# Patient Record
Sex: Female | Born: 1945 | Race: White | Hispanic: No | Marital: Married | Smoking: Never smoker
Health system: Southern US, Community
[De-identification: ages and names within clinical notes are randomized; demographics above are authoritative.]

## PROBLEM LIST (undated history)

## (undated) DIAGNOSIS — Z9221 Personal history of antineoplastic chemotherapy: Secondary | ICD-10-CM

## (undated) DIAGNOSIS — M8580 Other specified disorders of bone density and structure, unspecified site: Secondary | ICD-10-CM

## (undated) DIAGNOSIS — F32A Depression, unspecified: Secondary | ICD-10-CM

## (undated) DIAGNOSIS — Z1379 Encounter for other screening for genetic and chromosomal anomalies: Secondary | ICD-10-CM

## (undated) DIAGNOSIS — R112 Nausea with vomiting, unspecified: Secondary | ICD-10-CM

## (undated) DIAGNOSIS — F329 Major depressive disorder, single episode, unspecified: Secondary | ICD-10-CM

## (undated) DIAGNOSIS — Z923 Personal history of irradiation: Secondary | ICD-10-CM

## (undated) DIAGNOSIS — G20A1 Parkinson's disease without dyskinesia, without mention of fluctuations: Secondary | ICD-10-CM

## (undated) DIAGNOSIS — G20 Parkinson's disease: Secondary | ICD-10-CM

## (undated) DIAGNOSIS — L309 Dermatitis, unspecified: Secondary | ICD-10-CM

## (undated) DIAGNOSIS — H9190 Unspecified hearing loss, unspecified ear: Secondary | ICD-10-CM

## (undated) DIAGNOSIS — Z974 Presence of external hearing-aid: Secondary | ICD-10-CM

## (undated) DIAGNOSIS — C50919 Malignant neoplasm of unspecified site of unspecified female breast: Secondary | ICD-10-CM

## (undated) DIAGNOSIS — Z9889 Other specified postprocedural states: Secondary | ICD-10-CM

## (undated) DIAGNOSIS — T1490XA Injury, unspecified, initial encounter: Secondary | ICD-10-CM

## (undated) DIAGNOSIS — K635 Polyp of colon: Secondary | ICD-10-CM

## (undated) DIAGNOSIS — H9319 Tinnitus, unspecified ear: Secondary | ICD-10-CM

## (undated) DIAGNOSIS — F429 Obsessive-compulsive disorder, unspecified: Secondary | ICD-10-CM

## (undated) DIAGNOSIS — F419 Anxiety disorder, unspecified: Secondary | ICD-10-CM

## (undated) DIAGNOSIS — E7800 Pure hypercholesterolemia, unspecified: Secondary | ICD-10-CM

## (undated) HISTORY — PX: OTHER SURGICAL HISTORY: SHX169

## (undated) HISTORY — DX: Pure hypercholesterolemia, unspecified: E78.00

## (undated) HISTORY — DX: Depression, unspecified: F32.A

## (undated) HISTORY — PX: BREAST SURGERY: SHX581

## (undated) HISTORY — PX: DILATION AND CURETTAGE OF UTERUS: SHX78

## (undated) HISTORY — PX: MASTECTOMY: SHX3

## (undated) HISTORY — DX: Parkinson's disease without dyskinesia, without mention of fluctuations: G20.A1

## (undated) HISTORY — DX: Parkinson's disease: G20

## (undated) HISTORY — DX: Anxiety disorder, unspecified: F41.9

## (undated) HISTORY — DX: Major depressive disorder, single episode, unspecified: F32.9

## (undated) HISTORY — DX: Encounter for other screening for genetic and chromosomal anomalies: Z13.79

## (undated) HISTORY — PX: COLONOSCOPY: SHX174

## (undated) HISTORY — PX: CARDIAC CATHETERIZATION: SHX172

## (undated) HISTORY — DX: Other specified disorders of bone density and structure, unspecified site: M85.80

## (undated) HISTORY — PX: MOUTH SURGERY: SHX715

## (undated) HISTORY — DX: Polyp of colon: K63.5

## (undated) HISTORY — DX: Obsessive-compulsive disorder, unspecified: F42.9

## (undated) HISTORY — DX: Tinnitus, unspecified ear: H93.19

## (undated) HISTORY — DX: Malignant neoplasm of unspecified site of unspecified female breast: C50.919

## (undated) HISTORY — DX: Unspecified hearing loss, unspecified ear: H91.90

## (undated) NOTE — Telephone Encounter (Signed)
 Formatting of this note might be different from the original. Pt would like to speak with a nurse or physician, regarding the following.  (Medication or Ailment & Reason)  Pt calling to give her blood pressure log,  04/13/24-  blood pressure sitting 99/62, pulse 90 at 10:53 am  Blood pressure standing 88/52, pulse 95 at 10:57 am  Blood pressure sitting 86/51, pulse 92 2:05 pm  Blood pressure standing 67/52, pulse 96 2:10 pm  Blood pressure sitting 111/73, pulse 87 6:09 pm Blood pressure standing 94/55, pulse 99 6:15 pm  04/14/24- Blood pressure Sitting 92/54, pulse 97 11:09 am  Blood pressure  Standing 89/60, pulse 175 11:17 am Blood pressure  Sitting 88/53, pulse 89 3:18pm Blood pressure Standing 114/43, pulse 95 3:26 pm  Blood pressure  Sitting 123/68, pulse 92 5:56 pm  Blood pressure standing 80/49, pulse 98 6:00 pm   04/15/24 Blood pressure Sitting 119/58, pulse 85 11:22 am  Blood pressure Standing 90/41, pulse 94 11:26 am  Blood pressure Sitting 86/41, pulse 126 3:14 pm Blood pressure Sitting 113/98, pulse 132 3:18 pm Blood pressure Standing 120/83, pulse 126 3:21 pm Blood pressure Sitting 112/64, pulse 90 6:43 pm Blood pressure Standing 85/49, pulse 94 6:48 pm   Please call Pt to advise. [phone]  Electronically signed by Baldwin Search at 04/16/2024 10:26 AM CDT

---

## 2001-03-19 ENCOUNTER — Encounter: Admission: RE | Admit: 2001-03-19 | Discharge: 2001-03-19 | Payer: Self-pay | Admitting: Obstetrics and Gynecology

## 2001-03-19 ENCOUNTER — Encounter: Payer: Self-pay | Admitting: Obstetrics and Gynecology

## 2001-10-03 ENCOUNTER — Encounter: Admission: RE | Admit: 2001-10-03 | Discharge: 2001-10-03 | Payer: Self-pay | Admitting: General Surgery

## 2001-10-03 ENCOUNTER — Encounter: Payer: Self-pay | Admitting: General Surgery

## 2002-11-06 ENCOUNTER — Encounter: Payer: Self-pay | Admitting: Obstetrics and Gynecology

## 2002-11-06 ENCOUNTER — Encounter: Admission: RE | Admit: 2002-11-06 | Discharge: 2002-11-06 | Payer: Self-pay | Admitting: Obstetrics and Gynecology

## 2003-11-18 ENCOUNTER — Encounter: Admission: RE | Admit: 2003-11-18 | Discharge: 2003-11-18 | Payer: Self-pay | Admitting: Obstetrics and Gynecology

## 2004-05-01 ENCOUNTER — Other Ambulatory Visit: Admission: RE | Admit: 2004-05-01 | Discharge: 2004-05-01 | Payer: Self-pay | Admitting: Gynecology

## 2004-07-21 ENCOUNTER — Other Ambulatory Visit: Admission: RE | Admit: 2004-07-21 | Discharge: 2004-07-21 | Payer: Self-pay | Admitting: Gynecology

## 2004-11-30 ENCOUNTER — Encounter: Admission: RE | Admit: 2004-11-30 | Discharge: 2004-11-30 | Payer: Self-pay | Admitting: Gynecology

## 2005-05-28 ENCOUNTER — Other Ambulatory Visit: Admission: RE | Admit: 2005-05-28 | Discharge: 2005-05-28 | Payer: Self-pay | Admitting: Gynecology

## 2005-09-12 DIAGNOSIS — K635 Polyp of colon: Secondary | ICD-10-CM

## 2005-09-12 HISTORY — DX: Polyp of colon: K63.5

## 2005-12-11 ENCOUNTER — Encounter: Admission: RE | Admit: 2005-12-11 | Discharge: 2005-12-11 | Payer: Self-pay | Admitting: Gynecology

## 2006-05-21 ENCOUNTER — Encounter: Admission: RE | Admit: 2006-05-21 | Discharge: 2006-05-21 | Payer: Self-pay | Admitting: Family Medicine

## 2006-06-03 ENCOUNTER — Other Ambulatory Visit: Admission: RE | Admit: 2006-06-03 | Discharge: 2006-06-03 | Payer: Self-pay | Admitting: Gynecology

## 2006-12-26 ENCOUNTER — Encounter: Admission: RE | Admit: 2006-12-26 | Discharge: 2006-12-26 | Payer: Self-pay | Admitting: Gynecology

## 2007-03-27 ENCOUNTER — Encounter: Admission: RE | Admit: 2007-03-27 | Discharge: 2007-03-27 | Payer: Self-pay | Admitting: Family Medicine

## 2007-06-06 ENCOUNTER — Other Ambulatory Visit: Admission: RE | Admit: 2007-06-06 | Discharge: 2007-06-06 | Payer: Self-pay | Admitting: Gynecology

## 2008-01-01 ENCOUNTER — Encounter: Admission: RE | Admit: 2008-01-01 | Discharge: 2008-01-01 | Payer: Self-pay | Admitting: Gynecology

## 2008-06-16 ENCOUNTER — Ambulatory Visit: Payer: Self-pay | Admitting: Gynecology

## 2008-06-16 ENCOUNTER — Encounter: Payer: Self-pay | Admitting: Gynecology

## 2008-06-16 ENCOUNTER — Other Ambulatory Visit: Admission: RE | Admit: 2008-06-16 | Discharge: 2008-06-16 | Payer: Self-pay | Admitting: Gynecology

## 2008-08-23 ENCOUNTER — Ambulatory Visit: Payer: Self-pay | Admitting: Gynecology

## 2008-09-14 ENCOUNTER — Ambulatory Visit: Payer: Self-pay | Admitting: Gynecology

## 2008-09-28 ENCOUNTER — Ambulatory Visit: Payer: Self-pay | Admitting: Gynecology

## 2008-11-04 ENCOUNTER — Encounter: Admission: RE | Admit: 2008-11-04 | Discharge: 2008-11-04 | Payer: Self-pay | Admitting: Gastroenterology

## 2009-01-03 ENCOUNTER — Encounter: Admission: RE | Admit: 2009-01-03 | Discharge: 2009-01-03 | Payer: Self-pay | Admitting: Gynecology

## 2009-06-23 ENCOUNTER — Ambulatory Visit: Payer: Self-pay | Admitting: Gynecology

## 2009-06-23 ENCOUNTER — Other Ambulatory Visit: Admission: RE | Admit: 2009-06-23 | Discharge: 2009-06-23 | Payer: Self-pay | Admitting: Gynecology

## 2009-06-23 ENCOUNTER — Encounter: Payer: Self-pay | Admitting: Gynecology

## 2010-01-11 ENCOUNTER — Encounter: Admission: RE | Admit: 2010-01-11 | Discharge: 2010-01-11 | Payer: Self-pay | Admitting: Gynecology

## 2010-07-04 ENCOUNTER — Ambulatory Visit: Payer: Self-pay | Admitting: Gynecology

## 2010-07-04 ENCOUNTER — Other Ambulatory Visit: Admission: RE | Admit: 2010-07-04 | Discharge: 2010-07-04 | Payer: Self-pay | Admitting: Gynecology

## 2010-09-12 ENCOUNTER — Ambulatory Visit: Payer: Self-pay | Admitting: Gynecology

## 2010-09-18 ENCOUNTER — Ambulatory Visit: Payer: Self-pay | Admitting: Gynecology

## 2011-01-01 ENCOUNTER — Other Ambulatory Visit: Payer: Self-pay | Admitting: Gynecology

## 2011-01-01 DIAGNOSIS — Z1231 Encounter for screening mammogram for malignant neoplasm of breast: Secondary | ICD-10-CM

## 2011-01-15 ENCOUNTER — Ambulatory Visit
Admission: RE | Admit: 2011-01-15 | Discharge: 2011-01-15 | Disposition: A | Discharge status: Home / Self Care | Payer: Medicare Other | Source: Ambulatory Visit | Attending: Gynecology | Admitting: Gynecology

## 2011-01-15 DIAGNOSIS — Z1231 Encounter for screening mammogram for malignant neoplasm of breast: Secondary | ICD-10-CM

## 2011-07-26 ENCOUNTER — Encounter: Payer: Self-pay | Admitting: Anesthesiology

## 2011-07-31 ENCOUNTER — Ambulatory Visit (INDEPENDENT_AMBULATORY_CARE_PROVIDER_SITE_OTHER): Payer: Medicare Other | Admitting: Gynecology

## 2011-07-31 ENCOUNTER — Encounter: Payer: Self-pay | Admitting: Gynecology

## 2011-07-31 VITALS — BP 120/70 | Ht 61.0 in | Wt 156.0 lb

## 2011-07-31 DIAGNOSIS — R82998 Other abnormal findings in urine: Secondary | ICD-10-CM

## 2011-07-31 DIAGNOSIS — N952 Postmenopausal atrophic vaginitis: Secondary | ICD-10-CM

## 2011-07-31 DIAGNOSIS — M8580 Other specified disorders of bone density and structure, unspecified site: Secondary | ICD-10-CM

## 2011-07-31 DIAGNOSIS — M899 Disorder of bone, unspecified: Secondary | ICD-10-CM

## 2011-07-31 NOTE — Progress Notes (Signed)
Addended byCammie Mcgee T on: 07/31/2011 11:19 AM   Modules accepted: Orders

## 2011-07-31 NOTE — Progress Notes (Signed)
Gloria Ware 09-Aug-1946 [id number]        65 y.o.  for follow up. Overall she is doing well history of osteopenia.   Past medical history,surgical history, medications, allergies, family history and social history were all reviewed and documented in the EPIC chart. ROS:  Was performed and pertinent positives and negatives are included in the history.  Exam: chaperone present Filed Vitals:   07/31/11 1031  BP: 120/70   General appearance  Normal Skin grossly normal Head/Neck normal with no cervical or supraclavicular adenopathy thyroid normal Lungs  Clear Cardiac RR, without RMG Abdominal  soft, nontender, without masses, organomegaly or hernia Breasts  examined lying and sitting without masses, retractions, discharge or axillary adenopathy. Pelvic  Ext/BUS/vagina  normal With atrophic genital changes noted  Cervix  normal    Uterus  axial, normal size, shape and contour, midline and mobile nontender   Adnexa  Without masses or tenderness    Anus and perineum  normal   Rectovaginal  normal sphincter tone without palpated masses or tenderness.    Assessment/Plan:  65 y.o. female for annual exam.     1. Osteopenia. Had been on for NIDA in the past but has stopped this and is on a drug-free holiday. Last bone density December 2011 showed -1.8 at the left femoral neck. We'll plan on repeat next year a 2 year interval and then go from there. Increase calcium vitamin D reviewed. 2. Health maintenance. SBE monthly reviewed. Had mammography April 2012 continued annual mammogram. Had colonoscopy February 2012. No history of significant abnormal Pap smears with documented normals in her chart with last 2011. Current screening guidelines reviewed with less frequent interval. Patient is comfortable with this and we'll plan on every three-year screening. No blood work was done today as it's done through her primary's office who follows her for her medical issues. Assuming she continues well from  a gynecologic standpoint then she will see [name] in a year sooner as needed.   Dara Lords MD, 11:01 AM 07/31/2011

## 2011-09-06 ENCOUNTER — Other Ambulatory Visit: Payer: Self-pay | Admitting: Otolaryngology

## 2011-09-27 ENCOUNTER — Ambulatory Visit
Admission: RE | Admit: 2011-09-27 | Discharge: 2011-09-27 | Disposition: A | Discharge status: Home / Self Care | Payer: Medicare Other | Source: Ambulatory Visit | Attending: Otolaryngology | Admitting: Otolaryngology

## 2011-09-27 ENCOUNTER — Other Ambulatory Visit: Payer: Self-pay | Admitting: Otolaryngology

## 2011-09-27 LAB — CREATININE, SERUM: Creat: 0.9 mg/dL (ref 0.50–1.10)

## 2011-09-27 MED ORDER — GADOBENATE DIMEGLUMINE 529 MG/ML IV SOLN
14.0000 mL | Freq: Once | INTRAVENOUS | Status: AC | PRN
  Administered 2011-09-27: 14 mL via INTRAVENOUS

## 2011-12-05 DIAGNOSIS — H25049 Posterior subcapsular polar age-related cataract, unspecified eye: Secondary | ICD-10-CM | POA: Diagnosis not present

## 2012-01-10 ENCOUNTER — Other Ambulatory Visit: Payer: Self-pay | Admitting: Gynecology

## 2012-01-10 DIAGNOSIS — Z1231 Encounter for screening mammogram for malignant neoplasm of breast: Secondary | ICD-10-CM

## 2012-01-30 DIAGNOSIS — F411 Generalized anxiety disorder: Secondary | ICD-10-CM | POA: Diagnosis not present

## 2012-01-30 DIAGNOSIS — E785 Hyperlipidemia, unspecified: Secondary | ICD-10-CM | POA: Diagnosis not present

## 2012-01-31 ENCOUNTER — Ambulatory Visit
Admission: RE | Admit: 2012-01-31 | Discharge: 2012-01-31 | Disposition: A | Discharge status: Home / Self Care | Payer: Medicare Other | Source: Ambulatory Visit | Attending: Gynecology | Admitting: Gynecology

## 2012-01-31 DIAGNOSIS — Z1231 Encounter for screening mammogram for malignant neoplasm of breast: Secondary | ICD-10-CM | POA: Diagnosis not present

## 2012-03-20 DIAGNOSIS — Z0000 Encounter for general adult medical examination without abnormal findings: Secondary | ICD-10-CM | POA: Diagnosis not present

## 2012-07-24 DIAGNOSIS — Z23 Encounter for immunization: Secondary | ICD-10-CM | POA: Diagnosis not present

## 2012-08-06 ENCOUNTER — Encounter: Payer: Self-pay | Admitting: Gynecology

## 2012-08-06 ENCOUNTER — Ambulatory Visit (INDEPENDENT_AMBULATORY_CARE_PROVIDER_SITE_OTHER): Payer: BC Managed Care – PPO | Admitting: Gynecology

## 2012-08-06 VITALS — BP 126/70 | Ht 60.75 in | Wt 158.0 lb

## 2012-08-06 DIAGNOSIS — M8580 Other specified disorders of bone density and structure, unspecified site: Secondary | ICD-10-CM

## 2012-08-06 DIAGNOSIS — R82998 Other abnormal findings in urine: Secondary | ICD-10-CM | POA: Diagnosis not present

## 2012-08-06 DIAGNOSIS — M899 Disorder of bone, unspecified: Secondary | ICD-10-CM | POA: Diagnosis not present

## 2012-08-06 DIAGNOSIS — N952 Postmenopausal atrophic vaginitis: Secondary | ICD-10-CM

## 2012-08-06 DIAGNOSIS — M949 Disorder of cartilage, unspecified: Secondary | ICD-10-CM | POA: Diagnosis not present

## 2012-08-06 NOTE — Patient Instructions (Signed)
Follow up in one year for annual exam 

## 2012-08-06 NOTE — Progress Notes (Signed)
Gloria Ware [birth year] [id number]        66 y.o.  G2P2 for follow up exam.    Past medical history,surgical history, medications, allergies, family history and social history were all reviewed and documented in the EPIC chart. ROS:  Was performed and pertinent positives and negatives are included in the history.  Exam: Sherrilyn Rist assistant Filed Vitals:   08/06/12 1104  BP: 126/70  Height: 5' 0.75" (1.543 m)  Weight: 158 lb (71.668 kg)   General appearance  Normal Skin grossly normal Head/Neck normal with no cervical or supraclavicular adenopathy thyroid normal Lungs  clear Cardiac RR, without RMG Abdominal  soft, nontender, without masses, organomegaly or hernia Breasts  examined lying and sitting without masses, retractions, discharge or axillary adenopathy. Pelvic  Ext/BUS/vagina  normal with atrophic changes  Cervix  normal with atrophic changes  Uterus  axial, normal size, shape and contour, midline and mobile nontender   Adnexa  Without masses or tenderness    Anus and perineum  normal   Rectovaginal  normal sphincter tone without palpated masses or tenderness.    Assessment/Plan:  66 y.o. G2P2 female for follow up exam.   1. Postmenopausal with atrophic genital changes. Asymptomatic. We'll continue to monitor. Report any bleeding or other symptoms. 2. Osteopenia. DEXA 09/2010 with T score -1.8. Had been on Boniva previously but is on a drug-free holiday. Check DEXA now. 3. Pap smear. No Pap smear done today. Last Pap smear 2011. Discussed current screening guidelines options to stop altogether if she is over 65 and no history of abnormal Pap smears previously versus less frequent screening interval reviewed. We'll readdress on an annual basis. 4. Mammography. Patient had mammogram 01/2012. We'll continue with annual mammography. SBE monthly reviewed. 5. Colonoscopy. Patient had 2011. We'll follow up with the recommended interval. 6. Weight loss. Patient has gained weight but  admits to dietary indiscretion. The need to balance calories and and calories out reviewed. Increased exercise and decrease caloric intake discussed. Patient can work on this this year. 7. Health maintenance. Patient actively sees Dr. Leeroy Cha for routine health maintenance she'll follow up with him for this. No blood work done today. Follow up one year, sooner as needed.    Dara Lords MD, 11:51 AM 08/06/2012

## 2012-08-07 DIAGNOSIS — E785 Hyperlipidemia, unspecified: Secondary | ICD-10-CM | POA: Diagnosis not present

## 2012-08-07 DIAGNOSIS — F411 Generalized anxiety disorder: Secondary | ICD-10-CM | POA: Diagnosis not present

## 2012-08-07 LAB — URINALYSIS W MICROSCOPIC + REFLEX CULTURE
Casts: NONE SEEN
Glucose, UA: NEGATIVE mg/dL
Ketones, ur: NEGATIVE mg/dL
Nitrite: NEGATIVE
Specific Gravity, Urine: 1.016 (ref 1.005–1.030)

## 2012-08-08 ENCOUNTER — Telehealth: Payer: Self-pay | Admitting: Gynecology

## 2012-08-08 MED ORDER — SULFAMETHOXAZOLE-TMP DS 800-160 MG PO TABS: 1.0000 | ORAL_TABLET | Freq: Two times a day (BID) | ORAL | Status: DC

## 2012-08-08 NOTE — Telephone Encounter (Signed)
Patient called in follow up of our phone call to treat her for her asymptomatic bacteriuria. I reviewed with her that even though she does not have symptoms her urine grew out greater than 100,000 Escherichia coli and I recommended we go ahead and cover her with Septra DS one by mouth twice a day x7 days and she is in agreement. She would take this medication and follow up if there are any problems.

## 2012-08-08 NOTE — Telephone Encounter (Signed)
Left message for pt to call.

## 2012-08-08 NOTE — Telephone Encounter (Signed)
Tell patient her urine grew out bacteria and I will cover her with Septra DS one by mouth twice a day x7 days

## 2012-08-09 LAB — URINE CULTURE: Colony Count: >=100000

## 2012-09-16 ENCOUNTER — Ambulatory Visit (INDEPENDENT_AMBULATORY_CARE_PROVIDER_SITE_OTHER): Payer: Medicare Other

## 2012-09-16 DIAGNOSIS — M949 Disorder of cartilage, unspecified: Secondary | ICD-10-CM | POA: Diagnosis not present

## 2012-09-16 DIAGNOSIS — M899 Disorder of bone, unspecified: Secondary | ICD-10-CM

## 2012-09-16 DIAGNOSIS — M8580 Other specified disorders of bone density and structure, unspecified site: Secondary | ICD-10-CM

## 2012-09-17 ENCOUNTER — Telehealth: Payer: Self-pay | Admitting: Gynecology

## 2012-09-17 NOTE — Telephone Encounter (Signed)
Tell patient I want to talk to her about her bone density report and ask he to make an appointment to see me.

## 2012-09-18 NOTE — Telephone Encounter (Signed)
Left message patient to call.  

## 2012-09-18 NOTE — Telephone Encounter (Signed)
Patient informed and appt scheduled.

## 2012-09-22 ENCOUNTER — Ambulatory Visit (INDEPENDENT_AMBULATORY_CARE_PROVIDER_SITE_OTHER): Payer: Medicare Other | Admitting: Gynecology

## 2012-09-22 ENCOUNTER — Encounter: Payer: Self-pay | Admitting: Gynecology

## 2012-09-22 DIAGNOSIS — M899 Disorder of bone, unspecified: Secondary | ICD-10-CM

## 2012-09-22 DIAGNOSIS — M949 Disorder of cartilage, unspecified: Secondary | ICD-10-CM | POA: Diagnosis not present

## 2012-09-22 DIAGNOSIS — M8580 Other specified disorders of bone density and structure, unspecified site: Secondary | ICD-10-CM

## 2012-09-22 MED ORDER — ALENDRONATE SODIUM 70 MG PO TABS: 70.0000 mg | ORAL_TABLET | ORAL | Status: DC

## 2012-09-22 NOTE — Progress Notes (Signed)
Patient presents to discuss her DEXA. History of osteopenia where she had taken initially Fosamax and then Boniva for approximately 5 years but has been off of her medication for 2 years on a drug-free holiday.  DEXA shows T score -2.4 at the left femoral neck with a flax ten-year probability of fracture overall 34% hip at 7.3%. I reviewed the increased risk of fracture with her and my recommendation to restart the bisphosphate. I reviewed the risks to include osteonecrosis of the jaw, atypical fracture, GERD and esophageal cancer. Patient understands accepts and I reviewed the ways of taking Fosamax and she is to go ahead and restart this. We'll plan on reDEXA in 2 years.

## 2012-09-22 NOTE — Patient Instructions (Addendum)
Alendronate tablets  What is this medicine?  ALENDRONATE (a LEN droe nate) slows calcium loss from bones. It helps to make normal healthy bone and to slow bone loss in people with Paget's disease and osteoporosis. It may be used in others at risk for bone loss.  This medicine may be used for other purposes; ask your health care provider or pharmacist if you have questions.  What should I tell my health care provider before I take this medicine?  They need to know if you have any of these conditions:  -dental disease  -esophagus, stomach, or intestine problems, like acid reflux or GERD  -kidney disease  -low blood calcium  -low vitamin D  -problems sitting or standing 30 minutes  -trouble swallowing  -an unusual or allergic reaction to alendronate, other medicines, foods, dyes, or preservatives  -pregnant or trying to get pregnant  -breast-feeding  How should I use this medicine?  You must take this medicine exactly as directed or you will lower the amount of the medicine you absorb into your body or you may cause yourself harm. Take this medicine by mouth first thing in the morning, after you are up for the day. Do not eat or drink anything before you take your medicine. Swallow the tablet with a full glass (6 to 8 fluid ounces) of plain water. Do not take this medicine with any other drink. Do not chew or crush the tablet. After taking this medicine, do not eat breakfast, drink, or take any medicines or vitamins for at least 30 minutes. Sit or stand up for at least 30 minutes after you take this medicine; do not lie down. Do not take your medicine more often than directed.  Talk to your pediatrician regarding the use of this medicine in children. Special care may be needed.  Overdosage: If you think you have taken too much of this medicine contact a poison control center or emergency room at once.  NOTE: This medicine is only for you. Do not share this medicine with others.  What if I miss a dose?  If you miss a  dose, do not take it later in the day. Continue your normal schedule starting the next morning. Do not take double or extra doses.  What may interact with this medicine?  -aluminum hydroxide  -antacids  -aspirin  -calcium supplements  -drugs for inflammation like ibuprofen, naproxen, and others  -iron supplements  -magnesium supplements  -vitamins with minerals  This list may not describe all possible interactions. Give your health care provider a list of all the medicines, herbs, non-prescription drugs, or dietary supplements you use. Also tell them if you smoke, drink alcohol, or use illegal drugs. Some items may interact with your medicine.  What should I watch for while using this medicine?  Visit your doctor or health care professional for regular checks ups. It may be some time before you see benefit from this medicine. Do not stop taking your medicine except on your doctor's advice. Your doctor or health care professional may order blood tests and other tests to see how you are doing.  You should make sure you get enough calcium and vitamin D while you are taking this medicine, unless your doctor tells you not to. Discuss the foods you eat and the vitamins you take with your health care professional.  Some people who take this medicine have severe bone, joint, and/or muscle pain. This medicine may also increase your risk for a broken thigh bone.   Tell your doctor right away if you have pain in your upper leg or groin. Tell your doctor if you have any pain that does not go away or that gets worse.  This medicine can make you more sensitive to the sun. If you get a rash while taking this medicine, sunlight may cause the rash to get worse. Keep out of the sun. If you cannot avoid being in the sun, wear protective clothing and use sunscreen. Do not use sun lamps or tanning beds/booths.  What side effects may I notice from receiving this medicine?  Side effects that you should report to your doctor or health care  professional as soon as possible:  -allergic reactions like skin rash, itching or hives, swelling of the face, lips, or tongue  -black or tarry stools  -bone, muscle or joint pain  -changes in vision  -chest pain  -heartburn or stomach pain  -jaw pain, especially after dental work  -pain or trouble when swallowing  -redness, blistering, peeling or loosening of the skin, including inside the mouth  Side effects that usually do not require medical attention (report to your doctor or health care professional if they continue or are bothersome):  -changes in taste  -diarrhea or constipation  -eye pain or itching  -headache  -nausea or vomiting  -stomach gas or fullness  This list may not describe all possible side effects. Call your doctor for medical advice about side effects. You may report side effects to FDA at 1-800-FDA-1088.  Where should I keep my medicine?  Keep out of the reach of children.  Store at room temperature of 15 and 30 degrees C (59 and 86 degrees F). Throw away any unused medicine after the expiration date.  NOTE: This sheet is a summary. It may not cover all possible information. If you have questions about this medicine, talk to your doctor, pharmacist, or health care provider.   2013, [employer]. (03/23/2011 8:56:09 AM)

## 2012-11-03 ENCOUNTER — Telehealth: Payer: Self-pay | Admitting: *Deleted

## 2012-11-03 MED ORDER — RISEDRONATE SODIUM 150 MG PO TABS: 150.0000 mg | ORAL_TABLET | ORAL | Status: DC

## 2012-11-03 NOTE — Telephone Encounter (Signed)
Pt informed with the below note. 

## 2012-11-03 NOTE — Telephone Encounter (Signed)
Pt is currently taking fosamax and is complaining that it gives her terrible heart burn. Pt took the medication as direction with directions. She asked if boniva would be an option for her? If not what else could she take, because she does want treatment for her bones. Please advise

## 2012-11-03 NOTE — Telephone Encounter (Signed)
I would suggest trying a once monthly. My concern with Gloria Ware is that it has been shown to be beneficial at the spine but not statistically significant at the hips. That seems to be the main issue for [name]. My suggestion would be to try Actonel 150 mg monthly and see how she does with that.  I left a sample at the front desk for her to try and wrote prescription for one year supply.

## 2012-11-10 DIAGNOSIS — Z23 Encounter for immunization: Secondary | ICD-10-CM | POA: Diagnosis not present

## 2013-01-08 DIAGNOSIS — H25049 Posterior subcapsular polar age-related cataract, unspecified eye: Secondary | ICD-10-CM | POA: Diagnosis not present

## 2013-02-02 ENCOUNTER — Other Ambulatory Visit: Payer: Self-pay

## 2013-02-02 DIAGNOSIS — Z1231 Encounter for screening mammogram for malignant neoplasm of breast: Secondary | ICD-10-CM

## 2013-02-12 ENCOUNTER — Ambulatory Visit
Admission: RE | Admit: 2013-02-12 | Discharge: 2013-02-12 | Disposition: A | Discharge status: Home / Self Care | Payer: Medicare Other | Source: Ambulatory Visit

## 2013-02-12 DIAGNOSIS — Z1231 Encounter for screening mammogram for malignant neoplasm of breast: Secondary | ICD-10-CM

## 2013-03-24 DIAGNOSIS — F411 Generalized anxiety disorder: Secondary | ICD-10-CM | POA: Diagnosis not present

## 2013-03-24 DIAGNOSIS — Z1331 Encounter for screening for depression: Secondary | ICD-10-CM | POA: Diagnosis not present

## 2013-03-24 DIAGNOSIS — Z23 Encounter for immunization: Secondary | ICD-10-CM | POA: Diagnosis not present

## 2013-03-24 DIAGNOSIS — Z0000 Encounter for general adult medical examination without abnormal findings: Secondary | ICD-10-CM | POA: Diagnosis not present

## 2013-03-24 DIAGNOSIS — E785 Hyperlipidemia, unspecified: Secondary | ICD-10-CM | POA: Diagnosis not present

## 2013-03-30 ENCOUNTER — Telehealth: Payer: Self-pay | Admitting: *Deleted

## 2013-03-30 NOTE — Telephone Encounter (Signed)
Pt informed with the below note, she will continue the Actonel.

## 2013-03-30 NOTE — Telephone Encounter (Signed)
Her last bone density clearly showed treatment is indicated. She has increased risk of fracture. I cannot speak to the dental issues but as far as the knee for medication I think it is indicated to help prevent fracture.

## 2013-03-30 NOTE — Telephone Encounter (Signed)
Pt was given Rx for Actonel 150 mg back in Jan. Pt has had a lot of oral surgery with gums and teeth implant placed, surgeron told pt that only take the Actonel if she truly needs medication, which scared her, stating that it may weaken her teeth. She has only taken 3 doses of Actonel and stopped medication, she has osteopenia and wasn't sure if you were aware of the oral surgery. Pt asked if you think she will be safe to take medication?

## 2013-07-15 DIAGNOSIS — Z23 Encounter for immunization: Secondary | ICD-10-CM | POA: Diagnosis not present

## 2013-08-07 DIAGNOSIS — H00029 Hordeolum internum unspecified eye, unspecified eyelid: Secondary | ICD-10-CM | POA: Diagnosis not present

## 2013-08-10 ENCOUNTER — Other Ambulatory Visit (HOSPITAL_COMMUNITY)
Admission: RE | Admit: 2013-08-10 | Discharge: 2013-08-10 | Disposition: A | Discharge status: Home / Self Care | Payer: Medicare Other | Source: Ambulatory Visit | Attending: Gynecology | Admitting: Gynecology

## 2013-08-10 ENCOUNTER — Encounter: Payer: Self-pay | Admitting: Gynecology

## 2013-08-10 ENCOUNTER — Ambulatory Visit (INDEPENDENT_AMBULATORY_CARE_PROVIDER_SITE_OTHER): Payer: Medicare Other | Admitting: Gynecology

## 2013-08-10 VITALS — BP 114/70 | Ht 61.0 in | Wt 155.0 lb

## 2013-08-10 DIAGNOSIS — N952 Postmenopausal atrophic vaginitis: Secondary | ICD-10-CM

## 2013-08-10 DIAGNOSIS — M8580 Other specified disorders of bone density and structure, unspecified site: Secondary | ICD-10-CM

## 2013-08-10 DIAGNOSIS — M899 Disorder of bone, unspecified: Secondary | ICD-10-CM

## 2013-08-10 DIAGNOSIS — L723 Sebaceous cyst: Secondary | ICD-10-CM | POA: Diagnosis not present

## 2013-08-10 DIAGNOSIS — Z124 Encounter for screening for malignant neoplasm of cervix: Secondary | ICD-10-CM | POA: Insufficient documentation

## 2013-08-10 MED ORDER — RISEDRONATE SODIUM 150 MG PO TABS: 150.0000 mg | ORAL_TABLET | ORAL | Status: DC

## 2013-08-10 NOTE — Progress Notes (Signed)
Gloria Ware 08/21/1946 [id number]        67 y.o.  G2P2 for followup exam.  Several issues noted below.  Past medical history,surgical history, problem list, medications, allergies, family history and social history were all reviewed and documented in the EPIC chart.  ROS:  Performed and pertinent positives and negatives are included in the history, assessment and plan .  Exam: Kim assistant Filed Vitals:   08/10/13 0933  BP: 114/70  Height: 5\' 1"  (1.549 m)  Weight: 155 lb (70.308 kg)   General appearance  Normal Skin grossly normal Head/Neck normal with no cervical or supraclavicular adenopathy thyroid normal Lungs  clear Cardiac RR, without RMG Abdominal  soft, nontender, without masses, organomegaly or hernia Breasts  examined lying and sitting without masses, retractions, discharge or axillary adenopathy. Pelvic  Ext/BUS/vagina  normal with atrophic changes  Cervix  normal with atrophic changes. Pap done  Uterus  anteverted, normal size, shape and contour, midline and mobile nontender   Adnexa  Without masses or tenderness    Anus and perineum  normal   Rectovaginal  normal sphincter tone without palpated masses or tenderness.    Assessment/Plan:  67 y.o. G2P2 female for annual exam.   1. Postmenopausal. Patient without significant symptoms such as hot flashes, night sweats, vaginal dryness, bleeding or dyspareunia. Will continue to monitor. Patient knows to report any vaginal bleeding. 2. Osteopenia 09/2012 T score -2.4. FRAX 34%/7.3%. On Actonel since last year. Had been on Boniva previously for approximately 5 years and then off of it for 2 years. Continue Actonel through next year with repeat DEXA at a 2 year interval. Increase calcium vitamin D reviewed. 3. Perianal sebaceous cyst x2. At 8:00 and 4:00 patient has 2 small classic sebaceous cysts around the anal opening. Patient had noticed these and brought these to my attention. Reassured patient that she will continue  to monitor as long as they remain unchanged she will follow. 4. Pap smear 2011. Pap smear done today. Reviewed current screening guidelines and options to stop screening altogether as she is over 65 or less frequent screening intervals like every 3 years discussed. She has no history of any abnormal Pap smears previously. Will rediscuss on an annual basis. 5. Mammography 02/2013. Continue with annual mammography. SBE monthly reviewed. 6. Colonoscopy 2011. Patient is going to call her gastroenterologist at the five-year mark to check when she should repeat this. 7. Health maintenance. No blood work done as this is all done through her primary physician's office. Followup one year, sooner as needed.  Note: This document was prepared with digital dictation and possible smart phrase technology. Any transcriptional errors that result from this process are unintentional.   Dara Lords MD, 10:35 AM 08/10/2013

## 2013-08-10 NOTE — Patient Instructions (Signed)
Follow up in one year, sooner as needed. 

## 2013-08-14 DIAGNOSIS — H25049 Posterior subcapsular polar age-related cataract, unspecified eye: Secondary | ICD-10-CM | POA: Diagnosis not present

## 2013-09-23 DIAGNOSIS — F411 Generalized anxiety disorder: Secondary | ICD-10-CM | POA: Diagnosis not present

## 2013-09-23 DIAGNOSIS — E785 Hyperlipidemia, unspecified: Secondary | ICD-10-CM | POA: Diagnosis not present

## 2013-10-27 DIAGNOSIS — H25049 Posterior subcapsular polar age-related cataract, unspecified eye: Secondary | ICD-10-CM | POA: Diagnosis not present

## 2013-11-20 DIAGNOSIS — H2589 Other age-related cataract: Secondary | ICD-10-CM | POA: Diagnosis not present

## 2013-11-20 DIAGNOSIS — H43399 Other vitreous opacities, unspecified eye: Secondary | ICD-10-CM | POA: Diagnosis not present

## 2013-11-20 DIAGNOSIS — H43819 Vitreous degeneration, unspecified eye: Secondary | ICD-10-CM | POA: Diagnosis not present

## 2014-01-19 DIAGNOSIS — R5381 Other malaise: Secondary | ICD-10-CM | POA: Diagnosis not present

## 2014-01-19 DIAGNOSIS — R5383 Other fatigue: Secondary | ICD-10-CM | POA: Diagnosis not present

## 2014-01-25 DIAGNOSIS — H25019 Cortical age-related cataract, unspecified eye: Secondary | ICD-10-CM | POA: Diagnosis not present

## 2014-01-25 DIAGNOSIS — H25049 Posterior subcapsular polar age-related cataract, unspecified eye: Secondary | ICD-10-CM | POA: Diagnosis not present

## 2014-01-25 DIAGNOSIS — H2589 Other age-related cataract: Secondary | ICD-10-CM | POA: Diagnosis not present

## 2014-01-25 DIAGNOSIS — H2510 Age-related nuclear cataract, unspecified eye: Secondary | ICD-10-CM | POA: Diagnosis not present

## 2014-02-15 DIAGNOSIS — H2589 Other age-related cataract: Secondary | ICD-10-CM | POA: Diagnosis not present

## 2014-02-22 DIAGNOSIS — H2589 Other age-related cataract: Secondary | ICD-10-CM | POA: Diagnosis not present

## 2014-02-22 DIAGNOSIS — H25019 Cortical age-related cataract, unspecified eye: Secondary | ICD-10-CM | POA: Diagnosis not present

## 2014-02-22 DIAGNOSIS — H25049 Posterior subcapsular polar age-related cataract, unspecified eye: Secondary | ICD-10-CM | POA: Diagnosis not present

## 2014-02-22 DIAGNOSIS — H2510 Age-related nuclear cataract, unspecified eye: Secondary | ICD-10-CM | POA: Diagnosis not present

## 2014-02-23 ENCOUNTER — Other Ambulatory Visit: Payer: Self-pay

## 2014-02-23 DIAGNOSIS — Z961 Presence of intraocular lens: Secondary | ICD-10-CM | POA: Diagnosis not present

## 2014-02-23 DIAGNOSIS — Z1231 Encounter for screening mammogram for malignant neoplasm of breast: Secondary | ICD-10-CM

## 2014-02-23 DIAGNOSIS — D2310 Other benign neoplasm of skin of unspecified eyelid, including canthus: Secondary | ICD-10-CM | POA: Diagnosis not present

## 2014-03-09 ENCOUNTER — Other Ambulatory Visit: Payer: Self-pay | Admitting: Ophthalmology

## 2014-03-09 DIAGNOSIS — Z961 Presence of intraocular lens: Secondary | ICD-10-CM | POA: Diagnosis not present

## 2014-03-09 DIAGNOSIS — D2310 Other benign neoplasm of skin of unspecified eyelid, including canthus: Secondary | ICD-10-CM | POA: Diagnosis not present

## 2014-03-09 DIAGNOSIS — D1801 Hemangioma of skin and subcutaneous tissue: Secondary | ICD-10-CM | POA: Diagnosis not present

## 2014-03-11 ENCOUNTER — Ambulatory Visit
Admission: RE | Admit: 2014-03-11 | Discharge: 2014-03-11 | Disposition: A | Discharge status: Home / Self Care | Payer: Medicare Other | Source: Ambulatory Visit

## 2014-03-11 ENCOUNTER — Encounter (INDEPENDENT_AMBULATORY_CARE_PROVIDER_SITE_OTHER): Payer: Self-pay

## 2014-03-11 DIAGNOSIS — Z1231 Encounter for screening mammogram for malignant neoplasm of breast: Secondary | ICD-10-CM

## 2014-04-08 DIAGNOSIS — F411 Generalized anxiety disorder: Secondary | ICD-10-CM | POA: Diagnosis not present

## 2014-04-08 DIAGNOSIS — E785 Hyperlipidemia, unspecified: Secondary | ICD-10-CM | POA: Diagnosis not present

## 2014-07-28 DIAGNOSIS — H26493 Other secondary cataract, bilateral: Secondary | ICD-10-CM | POA: Diagnosis not present

## 2014-07-29 DIAGNOSIS — Z23 Encounter for immunization: Secondary | ICD-10-CM | POA: Diagnosis not present

## 2014-08-02 ENCOUNTER — Other Ambulatory Visit: Payer: Self-pay | Admitting: Gynecology

## 2014-08-09 ENCOUNTER — Encounter: Payer: Self-pay | Admitting: Gynecology

## 2014-08-09 DIAGNOSIS — Z23 Encounter for immunization: Secondary | ICD-10-CM | POA: Diagnosis not present

## 2014-08-09 DIAGNOSIS — R748 Abnormal levels of other serum enzymes: Secondary | ICD-10-CM | POA: Diagnosis not present

## 2014-08-09 DIAGNOSIS — J309 Allergic rhinitis, unspecified: Secondary | ICD-10-CM | POA: Diagnosis not present

## 2014-08-09 DIAGNOSIS — Z1389 Encounter for screening for other disorder: Secondary | ICD-10-CM | POA: Diagnosis not present

## 2014-08-09 DIAGNOSIS — F419 Anxiety disorder, unspecified: Secondary | ICD-10-CM | POA: Diagnosis not present

## 2014-08-09 DIAGNOSIS — M8580 Other specified disorders of bone density and structure, unspecified site: Secondary | ICD-10-CM | POA: Diagnosis not present

## 2014-08-09 DIAGNOSIS — E780 Pure hypercholesterolemia: Secondary | ICD-10-CM | POA: Diagnosis not present

## 2014-08-09 DIAGNOSIS — Z0000 Encounter for general adult medical examination without abnormal findings: Secondary | ICD-10-CM | POA: Diagnosis not present

## 2014-08-13 ENCOUNTER — Encounter: Payer: Medicare Other | Admitting: Gynecology

## 2014-08-17 ENCOUNTER — Ambulatory Visit (INDEPENDENT_AMBULATORY_CARE_PROVIDER_SITE_OTHER): Payer: Medicare Other | Admitting: Gynecology

## 2014-08-17 ENCOUNTER — Encounter: Payer: Self-pay | Admitting: Gynecology

## 2014-08-17 VITALS — BP 114/64 | Ht 61.0 in | Wt 153.0 lb

## 2014-08-17 DIAGNOSIS — N952 Postmenopausal atrophic vaginitis: Secondary | ICD-10-CM

## 2014-08-17 DIAGNOSIS — E780 Pure hypercholesterolemia: Secondary | ICD-10-CM | POA: Diagnosis not present

## 2014-08-17 DIAGNOSIS — M8580 Other specified disorders of bone density and structure, unspecified site: Secondary | ICD-10-CM | POA: Diagnosis not present

## 2014-08-17 MED ORDER — RISEDRONATE SODIUM 150 MG PO TABS: ORAL_TABLET | ORAL | Status: DC

## 2014-08-17 NOTE — Progress Notes (Signed)
Gloria Ware [birth year] [id number]        68 y.o.  G2P2 for follow up exam. Several issues noted below.  Past medical history,surgical history, problem list, medications, allergies, family history and social history were all reviewed and documented as reviewed in the EPIC chart.  ROS:  12 system ROS performed with pertinent positives and negatives included in the history, assessment and plan.   Additional significant findings :  none   Exam: Kim Counsellor Vitals:   08/17/14 1501  BP: 114/64  Height: 5\' 1"  (1.549 m)  Weight: 153 lb (69.4 kg)   General appearance:  Normal affect, orientation and appearance. Skin: Grossly normal HEENT: Without gross lesions.  No cervical or supraclavicular adenopathy. Thyroid normal.  Lungs:  Clear without wheezing, rales or rhonchi Cardiac: RR, without RMG Abdominal:  Soft, nontender, without masses, guarding, rebound, organomegaly or hernia Breasts:  Examined lying and sitting without masses, retractions, discharge or axillary adenopathy. Pelvic:  Ext/BUS/vagina with generalized atrophic changes  Cervix with atrophic changes  Uterus anteverted, normal size, shape and contour, midline and mobile nontender   Adnexa  Without masses or tenderness    Anus and perineum  Normal   Rectovaginal  Normal sphincter tone without palpated masses or tenderness.    Assessment/Plan:  68 y.o. G2P2 female for annual exam.   1. Postmenopausal/atrophic genital changes.  Patient without significant symptoms of hot flashes, night sweats, vaginal dryness. No vaginal bleeding. Continue to monitor. Call if any vaginal bleeding. 2. Osteopenia. DEXA 09/2012 T score -2.4. Patient is on Actonel for 2 years. Had been on Boniva previously for proximally 5 years and then discontinued for 2 years. Repeat DEXA now at 2 year interval. Continue on Actonel for now. 3. Pap smear 2014. No Pap smear done today. No history of significant abnormal Pap smears. Options to stop  screening altogether less frequent screening intervals per current screening guidelines as she is over the age of 58 reviewed. Will readdress on an annual basis. 4. Mammography 03/2014. Continue with annual mammography. SBE monthly reviewed. 5. Colonoscopy 2011. Repeat at their recommended interval. 6. Health maintenance. No routine blood work done as she reports this done at her primary physician's office. Follow up 1 year, sooner as needed.     Anastasio Auerbach MD, 3:24 PM 08/17/2014

## 2014-08-17 NOTE — Patient Instructions (Signed)
Follow up for your bone density in January 2016. Stay on the Actonel for now.  You may obtain a copy of any labs that were done today by logging onto MyChart as outlined in the instructions provided with your AVS (after visit summary). The office will not call with normal lab results but certainly if there are any significant abnormalities then we will contact you.   Health Maintenance, Female A healthy lifestyle and preventative care can promote health and wellness.  Maintain regular health, dental, and eye exams.  Eat a healthy diet. Foods like vegetables, fruits, whole grains, low-fat dairy products, and lean protein foods contain the nutrients you need without too many calories. Decrease your intake of foods high in solid fats, added sugars, and salt. Get information about a proper diet from your caregiver, if necessary.  Regular physical exercise is one of the most important things you can do for your health. Most adults should get at least 150 minutes of moderate-intensity exercise (any activity that increases your heart rate and causes you to sweat) each week. In addition, most adults need muscle-strengthening exercises on 2 or more days a week.   Maintain a healthy weight. The body mass index (BMI) is a screening tool to identify possible weight problems. It provides an estimate of body fat based on height and weight. Your caregiver can help determine your BMI, and can help you achieve or maintain a healthy weight. For adults 20 years and older:  A BMI below 18.5 is considered underweight.  A BMI of 18.5 to 24.9 is normal.  A BMI of 25 to 29.9 is considered overweight.  A BMI of 30 and above is considered obese.  Maintain normal blood lipids and cholesterol by exercising and minimizing your intake of saturated fat. Eat a balanced diet with plenty of fruits and vegetables. Blood tests for lipids and cholesterol should begin at age 49 and be repeated every 5 years. If your lipid or  cholesterol levels are high, you are over 50, or you are a high risk for heart disease, you may need your cholesterol levels checked more frequently.Ongoing high lipid and cholesterol levels should be treated with medicines if diet and exercise are not effective.  If you smoke, find out from your caregiver how to quit. If you do not use tobacco, do not start.  Lung cancer screening is recommended for adults aged 20 80 years who are at high risk for developing lung cancer because of a history of smoking. Yearly low-dose computed tomography (CT) is recommended for people who have at least a 30-pack-year history of smoking and are a current smoker or have quit within the past 15 years. A pack year of smoking is smoking an average of 1 pack of cigarettes a day for 1 year (for example: 1 pack a day for 30 years or 2 packs a day for 15 years). Yearly screening should continue until the smoker has stopped smoking for at least 15 years. Yearly screening should also be stopped for people who develop a health problem that would prevent them from having lung cancer treatment.  If you are pregnant, do not drink alcohol. If you are breastfeeding, be very cautious about drinking alcohol. If you are not pregnant and choose to drink alcohol, do not exceed 1 drink per day. One drink is considered to be 12 ounces (355 mL) of beer, 5 ounces (148 mL) of wine, or 1.5 ounces (44 mL) of liquor.  Avoid use of street drugs. Do  not share needles with anyone. Ask for help if you need support or instructions about stopping the use of drugs.  High blood pressure causes heart disease and increases the risk of stroke. Blood pressure should be checked at least every 1 to 2 years. Ongoing high blood pressure should be treated with medicines, if weight loss and exercise are not effective.  If you are 42 to 70 years old, ask your caregiver if you should take aspirin to prevent strokes.  Diabetes screening involves taking a blood sample  to check your fasting blood sugar level. This should be done once every 3 years, after age 68, if you are within normal weight and without risk factors for diabetes. Testing should be considered at a younger age or be carried out more frequently if you are overweight and have at least 1 risk factor for diabetes.  Breast cancer screening is essential preventative care for women. You should practice "breast self-awareness." This means understanding the normal appearance and feel of your breasts and may include breast self-examination. Any changes detected, no matter how small, should be reported to a caregiver. Women in their 70s and 30s should have a clinical breast exam (CBE) by a caregiver as part of a regular health exam every 1 to 3 years. After age 84, women should have a CBE every year. Starting at age 66, women should consider having a mammogram (breast X-ray) every year. Women who have a family history of breast cancer should talk to their caregiver about genetic screening. Women at a high risk of breast cancer should talk to their caregiver about having an MRI and a mammogram every year.  Breast cancer gene (BRCA)-related cancer risk assessment is recommended for women who have family members with BRCA-related cancers. BRCA-related cancers include breast, ovarian, tubal, and peritoneal cancers. Having family members with these cancers may be associated with an increased risk for harmful changes (mutations) in the breast cancer genes BRCA1 and BRCA2. Results of the assessment will determine the need for genetic counseling and BRCA1 and BRCA2 testing.  The Pap test is a screening test for cervical cancer. Women should have a Pap test starting at age 40. Between ages 72 and 14, Pap tests should be repeated every 2 years. Beginning at age 22, you should have a Pap test every 3 years as long as the past 3 Pap tests have been normal. If you had a hysterectomy for a problem that was not cancer or a condition  that could lead to cancer, then you no longer need Pap tests. If you are between ages 47 and 66, and you have had normal Pap tests going back 10 years, you no longer need Pap tests. If you have had past treatment for cervical cancer or a condition that could lead to cancer, you need Pap tests and screening for cancer for at least 20 years after your treatment. If Pap tests have been discontinued, risk factors (such as a new sexual partner) need to be reassessed to determine if screening should be resumed. Some women have medical problems that increase the chance of getting cervical cancer. In these cases, your caregiver may recommend more frequent screening and Pap tests.  The human papillomavirus (HPV) test is an additional test that may be used for cervical cancer screening. The HPV test looks for the virus that can cause the cell changes on the cervix. The cells collected during the Pap test can be tested for HPV. The HPV test could be used to  screen women aged 10 years and older, and should be used in women of any age who have unclear Pap test results. After the age of 3, women should have HPV testing at the same frequency as a Pap test.  Colorectal cancer can be detected and often prevented. Most routine colorectal cancer screening begins at the age of 18 and continues through age 37. However, your caregiver may recommend screening at an earlier age if you have risk factors for colon cancer. On a yearly basis, your caregiver may provide home test kits to check for hidden blood in the stool. Use of a small camera at the end of a tube, to directly examine the colon (sigmoidoscopy or colonoscopy), can detect the earliest forms of colorectal cancer. Talk to your caregiver about this at age 51, when routine screening begins. Direct examination of the colon should be repeated every 5 to 10 years through age 15, unless early forms of pre-cancerous polyps or small growths are found.  Hepatitis C blood testing is  recommended for all people born from [birth year] through 1967 and any individual with known risks for hepatitis C.  Practice safe sex. Use condoms and avoid high-risk sexual practices to reduce the spread of sexually transmitted infections (STIs). Sexually active women aged 61 and younger should be checked for Chlamydia, which is a common sexually transmitted infection. Older women with new or multiple partners should also be tested for Chlamydia. Testing for other STIs is recommended if you are sexually active and at increased risk.  Osteoporosis is a disease in which the bones lose minerals and strength with aging. This can result in serious bone fractures. The risk of osteoporosis can be identified using a bone density scan. Women ages 58 and over and women at risk for fractures or osteoporosis should discuss screening with their caregivers. Ask your caregiver whether you should be taking a calcium supplement or vitamin D to reduce the rate of osteoporosis.  Menopause can be associated with physical symptoms and risks. Hormone replacement therapy is available to decrease symptoms and risks. You should talk to your caregiver about whether hormone replacement therapy is right for you.  Use sunscreen. Apply sunscreen liberally and repeatedly throughout the day. You should seek shade when your shadow is shorter than you. Protect yourself by wearing long sleeves, pants, a wide-brimmed hat, and sunglasses year round, whenever you are outdoors.  Notify your caregiver of new moles or changes in moles, especially if there is a change in shape or color. Also notify your caregiver if a mole is larger than the size of a pencil eraser.  Stay current with your immunizations. Document Released: 04/09/2011 Document Revised: 01/19/2013 Document Reviewed: 04/09/2011 [facility] Patient Information 2014 [place].

## 2014-08-18 LAB — URINALYSIS W MICROSCOPIC + REFLEX CULTURE
BACTERIA UA: NONE SEEN
BILIRUBIN URINE: NEGATIVE
CASTS: NONE SEEN
CRYSTALS: NONE SEEN
GLUCOSE, UA: NEGATIVE mg/dL
Hgb urine dipstick: NEGATIVE
KETONES UR: NEGATIVE mg/dL
Leukocytes, UA: NEGATIVE
Nitrite: NEGATIVE
Protein, ur: NEGATIVE mg/dL
SPECIFIC GRAVITY, URINE: 1.006 (ref 1.005–1.030)
SQUAMOUS EPITHELIAL / LPF: NONE SEEN
UROBILINOGEN UA: 0.2 mg/dL (ref 0.0–1.0)
pH: 7 (ref 5.0–8.0)

## 2014-10-08 DIAGNOSIS — M8580 Other specified disorders of bone density and structure, unspecified site: Secondary | ICD-10-CM

## 2014-10-08 HISTORY — DX: Other specified disorders of bone density and structure, unspecified site: M85.80

## 2014-11-02 ENCOUNTER — Other Ambulatory Visit: Payer: Self-pay | Admitting: Gynecology

## 2014-11-02 ENCOUNTER — Ambulatory Visit (INDEPENDENT_AMBULATORY_CARE_PROVIDER_SITE_OTHER): Payer: Medicare Other

## 2014-11-02 DIAGNOSIS — M8580 Other specified disorders of bone density and structure, unspecified site: Secondary | ICD-10-CM

## 2014-11-02 DIAGNOSIS — Z13820 Encounter for screening for osteoporosis: Secondary | ICD-10-CM | POA: Diagnosis not present

## 2014-11-03 ENCOUNTER — Encounter: Payer: Self-pay | Admitting: Gynecology

## 2014-11-03 ENCOUNTER — Telehealth: Payer: Self-pay | Admitting: Gynecology

## 2014-11-03 NOTE — Telephone Encounter (Signed)
Tell patient that her bone density looks stable. I would recommend stopping her Actonel now and plan on a drug-free holiday with repeat DEXA in 2 years.

## 2014-11-03 NOTE — Telephone Encounter (Signed)
Pt informed with the below note. 

## 2014-11-03 NOTE — Telephone Encounter (Signed)
Left message for pt to call.

## 2015-03-21 ENCOUNTER — Other Ambulatory Visit: Payer: Self-pay

## 2015-03-21 DIAGNOSIS — Z1231 Encounter for screening mammogram for malignant neoplasm of breast: Secondary | ICD-10-CM

## 2015-04-07 DIAGNOSIS — H9312 Tinnitus, left ear: Secondary | ICD-10-CM | POA: Diagnosis not present

## 2015-04-07 DIAGNOSIS — Z1389 Encounter for screening for other disorder: Secondary | ICD-10-CM | POA: Diagnosis not present

## 2015-04-07 DIAGNOSIS — E780 Pure hypercholesterolemia: Secondary | ICD-10-CM | POA: Diagnosis not present

## 2015-04-07 DIAGNOSIS — F419 Anxiety disorder, unspecified: Secondary | ICD-10-CM | POA: Diagnosis not present

## 2015-04-14 ENCOUNTER — Ambulatory Visit
Admission: RE | Admit: 2015-04-14 | Discharge: 2015-04-14 | Disposition: A | Discharge status: Home / Self Care | Payer: Medicare Other | Source: Ambulatory Visit

## 2015-04-14 DIAGNOSIS — Z1231 Encounter for screening mammogram for malignant neoplasm of breast: Secondary | ICD-10-CM

## 2015-08-05 DIAGNOSIS — Z23 Encounter for immunization: Secondary | ICD-10-CM | POA: Diagnosis not present

## 2015-08-23 ENCOUNTER — Ambulatory Visit (INDEPENDENT_AMBULATORY_CARE_PROVIDER_SITE_OTHER): Payer: Medicare Other | Admitting: Gynecology

## 2015-08-23 ENCOUNTER — Encounter: Payer: Self-pay | Admitting: Gynecology

## 2015-08-23 VITALS — BP 112/70 | Ht 60.5 in | Wt 156.0 lb

## 2015-08-23 DIAGNOSIS — N952 Postmenopausal atrophic vaginitis: Secondary | ICD-10-CM

## 2015-08-23 DIAGNOSIS — N3281 Overactive bladder: Secondary | ICD-10-CM

## 2015-08-23 DIAGNOSIS — Z01419 Encounter for gynecological examination (general) (routine) without abnormal findings: Secondary | ICD-10-CM | POA: Diagnosis not present

## 2015-08-23 DIAGNOSIS — N318 Other neuromuscular dysfunction of bladder: Secondary | ICD-10-CM | POA: Diagnosis not present

## 2015-08-23 DIAGNOSIS — M8580 Other specified disorders of bone density and structure, unspecified site: Secondary | ICD-10-CM | POA: Diagnosis not present

## 2015-08-23 NOTE — Patient Instructions (Signed)

## 2015-08-23 NOTE — Progress Notes (Signed)
Gloria Ware [birth year] [id number]        69 y.o.  G2P2  For breast and pelvic exam. Several issues noted below.  Past medical history,surgical history, problem list, medications, allergies, family history and social history were all reviewed and documented as reviewed in the EPIC chart.  ROS:  Performed with pertinent positives and negatives included in the history, assessment and plan.   Additional significant findings :  none   Exam: Kim Counsellor Vitals:   08/23/15 1422  BP: 112/70  Height: 5' 0.5" (1.537 m)  Weight: 156 lb (70.761 kg)   General appearance:  Normal affect, orientation and appearance. Skin: Grossly normal HEENT: Without gross lesions.  No cervical or supraclavicular adenopathy. Thyroid normal.  Lungs:  Clear without wheezing, rales or rhonchi Cardiac: RR, without RMG Abdominal:  Soft, nontender, without masses, guarding, rebound, organomegaly or hernia Breasts:  Examined lying and sitting without masses, retractions, discharge or axillary adenopathy. Pelvic:  Ext/BUS/vagina with atrophic changes  Cervix with atrophic changes  Uterus anteverted, normal size, shape and contour, midline and mobile nontender   Adnexa  Without masses or tenderness    Anus and perineum  Normal   Rectovaginal  Normal sphincter tone without palpated masses or tenderness.    Assessment/Plan:  69 y.o. G2P2 female for breast and pelvic exam.   1. Postmenopausal/atrophic genital changes. Patient without significant hot flushes, night sweats, vaginal dryness or any vaginal bleeding. Continue to monitor and report any issues or bleeding. 2. Osteopenia. DEXA January 2016 T score -1.9 stable from prior DEXA. Had been on Actonel but stopped it. Will repeat DEXA at two-year interval. 3. Urgency. Patient notes when she starts to have to go the bathroom she has to rush to get there. Does not happen all the time. No frank incontinence. Issues as far as behavior modification, avoiding  provocative foods such as caffeine and spicy foods. Options for medication discussed but rejected. Patient prefers just to monitor at present.  Check baseline urinalysis today. 4. Mammography 04/2015. Continue with annual mammography when due. SBE monthly reviewed. 5. Colonoscopy 2012. Repeat at their recommended interval. 6. Pap smear 2014. No Pap smear done today. Options to stop screening versus less frequent screening intervals reviewed. Will readdress on an annual basis. 7. Health maintenance. No routine blood work done as patient reports this done at her primary physician's office.  Follow up in one year, sooner as needed.   Anastasio Auerbach MD, 2:53 PM 08/23/2015

## 2015-08-30 DIAGNOSIS — H26493 Other secondary cataract, bilateral: Secondary | ICD-10-CM | POA: Diagnosis not present

## 2015-11-03 DIAGNOSIS — F324 Major depressive disorder, single episode, in partial remission: Secondary | ICD-10-CM | POA: Diagnosis not present

## 2015-11-03 DIAGNOSIS — F419 Anxiety disorder, unspecified: Secondary | ICD-10-CM | POA: Diagnosis not present

## 2015-11-03 DIAGNOSIS — E7800 Pure hypercholesterolemia, unspecified: Secondary | ICD-10-CM | POA: Diagnosis not present

## 2015-11-03 DIAGNOSIS — J309 Allergic rhinitis, unspecified: Secondary | ICD-10-CM | POA: Diagnosis not present

## 2015-11-03 DIAGNOSIS — Z0000 Encounter for general adult medical examination without abnormal findings: Secondary | ICD-10-CM | POA: Diagnosis not present

## 2015-11-03 DIAGNOSIS — M8580 Other specified disorders of bone density and structure, unspecified site: Secondary | ICD-10-CM | POA: Diagnosis not present

## 2015-11-03 DIAGNOSIS — Z1389 Encounter for screening for other disorder: Secondary | ICD-10-CM | POA: Diagnosis not present

## 2015-12-19 DIAGNOSIS — F324 Major depressive disorder, single episode, in partial remission: Secondary | ICD-10-CM | POA: Diagnosis not present

## 2016-05-02 DIAGNOSIS — E7800 Pure hypercholesterolemia, unspecified: Secondary | ICD-10-CM | POA: Diagnosis not present

## 2016-05-02 DIAGNOSIS — F411 Generalized anxiety disorder: Secondary | ICD-10-CM | POA: Diagnosis not present

## 2016-05-02 DIAGNOSIS — F324 Major depressive disorder, single episode, in partial remission: Secondary | ICD-10-CM | POA: Diagnosis not present

## 2016-05-07 ENCOUNTER — Other Ambulatory Visit: Payer: Self-pay | Admitting: Gynecology

## 2016-05-07 DIAGNOSIS — Z1231 Encounter for screening mammogram for malignant neoplasm of breast: Secondary | ICD-10-CM

## 2016-05-18 ENCOUNTER — Ambulatory Visit
Admission: RE | Admit: 2016-05-18 | Discharge: 2016-05-18 | Disposition: A | Discharge status: Home / Self Care | Payer: Medicare Other | Source: Ambulatory Visit | Attending: Gynecology | Admitting: Gynecology

## 2016-05-18 DIAGNOSIS — Z1231 Encounter for screening mammogram for malignant neoplasm of breast: Secondary | ICD-10-CM

## 2016-05-22 ENCOUNTER — Other Ambulatory Visit: Payer: Self-pay | Admitting: Gynecology

## 2016-05-22 DIAGNOSIS — R928 Other abnormal and inconclusive findings on diagnostic imaging of breast: Secondary | ICD-10-CM

## 2016-05-25 ENCOUNTER — Ambulatory Visit
Admission: RE | Admit: 2016-05-25 | Discharge: 2016-05-25 | Disposition: A | Discharge status: Home / Self Care | Payer: Medicare Other | Source: Ambulatory Visit | Attending: Gynecology | Admitting: Gynecology

## 2016-05-25 DIAGNOSIS — R928 Other abnormal and inconclusive findings on diagnostic imaging of breast: Secondary | ICD-10-CM

## 2016-05-25 DIAGNOSIS — R922 Inconclusive mammogram: Secondary | ICD-10-CM | POA: Diagnosis not present

## 2016-07-31 DIAGNOSIS — M6289 Other specified disorders of muscle: Secondary | ICD-10-CM | POA: Diagnosis not present

## 2016-07-31 DIAGNOSIS — F419 Anxiety disorder, unspecified: Secondary | ICD-10-CM | POA: Diagnosis not present

## 2016-07-31 DIAGNOSIS — M7660 Achilles tendinitis, unspecified leg: Secondary | ICD-10-CM | POA: Diagnosis not present

## 2016-07-31 DIAGNOSIS — Z23 Encounter for immunization: Secondary | ICD-10-CM | POA: Diagnosis not present

## 2016-07-31 DIAGNOSIS — H9319 Tinnitus, unspecified ear: Secondary | ICD-10-CM | POA: Diagnosis not present

## 2016-07-31 DIAGNOSIS — E7800 Pure hypercholesterolemia, unspecified: Secondary | ICD-10-CM | POA: Diagnosis not present

## 2016-09-10 ENCOUNTER — Ambulatory Visit (INDEPENDENT_AMBULATORY_CARE_PROVIDER_SITE_OTHER): Payer: Medicare Other | Admitting: Gynecology

## 2016-09-10 ENCOUNTER — Encounter: Payer: Self-pay | Admitting: Gynecology

## 2016-09-10 VITALS — BP 114/66 | Ht 61.0 in | Wt 149.0 lb

## 2016-09-10 DIAGNOSIS — Z01411 Encounter for gynecological examination (general) (routine) with abnormal findings: Secondary | ICD-10-CM

## 2016-09-10 DIAGNOSIS — Z124 Encounter for screening for malignant neoplasm of cervix: Secondary | ICD-10-CM | POA: Diagnosis not present

## 2016-09-10 DIAGNOSIS — M8580 Other specified disorders of bone density and structure, unspecified site: Secondary | ICD-10-CM

## 2016-09-10 DIAGNOSIS — N952 Postmenopausal atrophic vaginitis: Secondary | ICD-10-CM

## 2016-09-10 NOTE — Patient Instructions (Signed)
Followup for bone density as scheduled. 

## 2016-09-10 NOTE — Addendum Note (Signed)
Addended by: Nelva Nay on: 09/10/2016 12:49 PM   Modules accepted: Orders

## 2016-09-10 NOTE — Progress Notes (Signed)
    Gloria Ware 08-Jan-1946 [id number]        70 y.o.  G2P2  for breast and pelvic exam  Past medical history,surgical history, problem list, medications, allergies, family history and social history were all reviewed and documented as reviewed in the EPIC chart.  ROS:  Performed with pertinent positives and negatives included in the history, assessment and plan.   Additional significant findings :  None   Exam: Caryn Bee assistant Vitals:   09/10/16 1153  BP: 114/66  Weight: 149 lb (67.6 kg)  Height: 5\' 1"  (1.549 m)   Body mass index is 28.15 kg/m.  General appearance:  Normal affect, orientation and appearance. Skin: Grossly normal HEENT: Without gross lesions.  No cervical or supraclavicular adenopathy. Thyroid normal.  Lungs:  Clear without wheezing, rales or rhonchi Cardiac: RR, without RMG Abdominal:  Soft, nontender, without masses, guarding, rebound, organomegaly or hernia Breasts:  Examined lying and sitting without masses, retractions, discharge or axillary adenopathy. Pelvic:  Ext, BUS, Vagina with atrophic changes  Cervix with atrophic changes  Uterus anteverted, normal size, shape and contour, midline and mobile nontender   Adnexa without masses or tenderness    Anus and perineum normal   Rectovaginal normal sphincter tone without palpated masses or tenderness.    Assessment/Plan:  70 y.o. G2P2 female for breast and pelvic exam.   1. Postmenopausal/atrophic genital changes. No significant hot flushes, night sweats, vaginal dryness or any vaginal bleeding. Continue to monitor report any issues or bleeding. 2. Osteopenia. DEXA 10/2014 T score -1.9 stable from prior DEXA. Schedule DEXA after the first of the year and patient agrees to do so. 3. Mammography 05/2016. Continue with annual mammography when due. SBE monthly reviewed. 4. Colonoscopy 2012. Repeat at their recommended interval. 5. Pap smear 2014.  Pap smear done today at patient request. She is  uncomfortable with the options to stop screening per current screening guidelines. 6. Health maintenance. No routine lab work done as patient reports this done elsewhere. I did order a urinalysis as she reports they do not check this elsewhere. Follow up for bone density. Follow up in one year, sooner as needed.   Anastasio Auerbach MD, 12:19 PM 09/10/2016

## 2016-09-11 LAB — PAP IG W/ RFLX HPV ASCU

## 2016-10-17 DIAGNOSIS — H26493 Other secondary cataract, bilateral: Secondary | ICD-10-CM | POA: Diagnosis not present

## 2016-11-02 DIAGNOSIS — M7661 Achilles tendinitis, right leg: Secondary | ICD-10-CM | POA: Diagnosis not present

## 2016-11-02 DIAGNOSIS — M722 Plantar fascial fibromatosis: Secondary | ICD-10-CM | POA: Diagnosis not present

## 2016-11-23 DIAGNOSIS — M7661 Achilles tendinitis, right leg: Secondary | ICD-10-CM | POA: Diagnosis not present

## 2016-11-27 DIAGNOSIS — Z0000 Encounter for general adult medical examination without abnormal findings: Secondary | ICD-10-CM | POA: Diagnosis not present

## 2016-11-27 DIAGNOSIS — F324 Major depressive disorder, single episode, in partial remission: Secondary | ICD-10-CM | POA: Diagnosis not present

## 2016-11-27 DIAGNOSIS — E7800 Pure hypercholesterolemia, unspecified: Secondary | ICD-10-CM | POA: Diagnosis not present

## 2016-11-27 DIAGNOSIS — F419 Anxiety disorder, unspecified: Secondary | ICD-10-CM | POA: Diagnosis not present

## 2016-11-29 ENCOUNTER — Encounter: Payer: Self-pay | Admitting: Gynecology

## 2016-11-29 ENCOUNTER — Ambulatory Visit (INDEPENDENT_AMBULATORY_CARE_PROVIDER_SITE_OTHER): Payer: Medicare Other

## 2016-11-29 DIAGNOSIS — Z780 Asymptomatic menopausal state: Secondary | ICD-10-CM

## 2016-11-29 DIAGNOSIS — M8580 Other specified disorders of bone density and structure, unspecified site: Secondary | ICD-10-CM

## 2016-11-29 DIAGNOSIS — M8589 Other specified disorders of bone density and structure, multiple sites: Secondary | ICD-10-CM | POA: Diagnosis not present

## 2016-12-03 ENCOUNTER — Other Ambulatory Visit: Payer: Self-pay | Admitting: Gynecology

## 2016-12-03 DIAGNOSIS — M8589 Other specified disorders of bone density and structure, multiple sites: Secondary | ICD-10-CM

## 2016-12-03 DIAGNOSIS — Z780 Asymptomatic menopausal state: Secondary | ICD-10-CM

## 2017-01-08 DIAGNOSIS — R29898 Other symptoms and signs involving the musculoskeletal system: Secondary | ICD-10-CM | POA: Diagnosis not present

## 2017-01-08 DIAGNOSIS — M25511 Pain in right shoulder: Secondary | ICD-10-CM | POA: Diagnosis not present

## 2017-01-14 DIAGNOSIS — M25511 Pain in right shoulder: Secondary | ICD-10-CM | POA: Diagnosis not present

## 2017-01-14 DIAGNOSIS — M7521 Bicipital tendinitis, right shoulder: Secondary | ICD-10-CM | POA: Diagnosis not present

## 2017-01-17 DIAGNOSIS — M25511 Pain in right shoulder: Secondary | ICD-10-CM | POA: Diagnosis not present

## 2017-01-17 DIAGNOSIS — M7521 Bicipital tendinitis, right shoulder: Secondary | ICD-10-CM | POA: Diagnosis not present

## 2017-01-31 DIAGNOSIS — M25511 Pain in right shoulder: Secondary | ICD-10-CM | POA: Diagnosis not present

## 2017-01-31 DIAGNOSIS — M7521 Bicipital tendinitis, right shoulder: Secondary | ICD-10-CM | POA: Diagnosis not present

## 2017-02-01 DIAGNOSIS — M7521 Bicipital tendinitis, right shoulder: Secondary | ICD-10-CM | POA: Diagnosis not present

## 2017-02-01 DIAGNOSIS — M25511 Pain in right shoulder: Secondary | ICD-10-CM | POA: Diagnosis not present

## 2017-02-05 DIAGNOSIS — M25511 Pain in right shoulder: Secondary | ICD-10-CM | POA: Diagnosis not present

## 2017-02-05 DIAGNOSIS — M7521 Bicipital tendinitis, right shoulder: Secondary | ICD-10-CM | POA: Diagnosis not present

## 2017-02-07 DIAGNOSIS — M25511 Pain in right shoulder: Secondary | ICD-10-CM | POA: Diagnosis not present

## 2017-02-07 DIAGNOSIS — M7521 Bicipital tendinitis, right shoulder: Secondary | ICD-10-CM | POA: Diagnosis not present

## 2017-02-12 DIAGNOSIS — M7521 Bicipital tendinitis, right shoulder: Secondary | ICD-10-CM | POA: Diagnosis not present

## 2017-02-12 DIAGNOSIS — M25511 Pain in right shoulder: Secondary | ICD-10-CM | POA: Diagnosis not present

## 2017-02-18 DIAGNOSIS — M25511 Pain in right shoulder: Secondary | ICD-10-CM | POA: Diagnosis not present

## 2017-02-18 DIAGNOSIS — M7521 Bicipital tendinitis, right shoulder: Secondary | ICD-10-CM | POA: Diagnosis not present

## 2017-02-22 DIAGNOSIS — E7800 Pure hypercholesterolemia, unspecified: Secondary | ICD-10-CM | POA: Diagnosis not present

## 2017-02-22 DIAGNOSIS — M1991 Primary osteoarthritis, unspecified site: Secondary | ICD-10-CM | POA: Diagnosis not present

## 2017-02-22 DIAGNOSIS — Z713 Dietary counseling and surveillance: Secondary | ICD-10-CM | POA: Diagnosis not present

## 2017-02-22 DIAGNOSIS — E663 Overweight: Secondary | ICD-10-CM | POA: Diagnosis not present

## 2017-02-22 DIAGNOSIS — Z6829 Body mass index (BMI) 29.0-29.9, adult: Secondary | ICD-10-CM | POA: Diagnosis not present

## 2017-02-26 DIAGNOSIS — M25551 Pain in right hip: Secondary | ICD-10-CM | POA: Diagnosis not present

## 2017-02-26 DIAGNOSIS — M7521 Bicipital tendinitis, right shoulder: Secondary | ICD-10-CM | POA: Diagnosis not present

## 2017-02-26 DIAGNOSIS — M25511 Pain in right shoulder: Secondary | ICD-10-CM | POA: Diagnosis not present

## 2017-02-26 DIAGNOSIS — M25552 Pain in left hip: Secondary | ICD-10-CM | POA: Diagnosis not present

## 2017-03-01 DIAGNOSIS — M7521 Bicipital tendinitis, right shoulder: Secondary | ICD-10-CM | POA: Diagnosis not present

## 2017-03-01 DIAGNOSIS — M25551 Pain in right hip: Secondary | ICD-10-CM | POA: Diagnosis not present

## 2017-03-01 DIAGNOSIS — M25511 Pain in right shoulder: Secondary | ICD-10-CM | POA: Diagnosis not present

## 2017-03-01 DIAGNOSIS — M25552 Pain in left hip: Secondary | ICD-10-CM | POA: Diagnosis not present

## 2017-03-14 DIAGNOSIS — M7521 Bicipital tendinitis, right shoulder: Secondary | ICD-10-CM | POA: Diagnosis not present

## 2017-03-14 DIAGNOSIS — M25511 Pain in right shoulder: Secondary | ICD-10-CM | POA: Diagnosis not present

## 2017-03-14 DIAGNOSIS — M25551 Pain in right hip: Secondary | ICD-10-CM | POA: Diagnosis not present

## 2017-03-14 DIAGNOSIS — M25552 Pain in left hip: Secondary | ICD-10-CM | POA: Diagnosis not present

## 2017-03-19 DIAGNOSIS — M25511 Pain in right shoulder: Secondary | ICD-10-CM | POA: Diagnosis not present

## 2017-03-19 DIAGNOSIS — M7521 Bicipital tendinitis, right shoulder: Secondary | ICD-10-CM | POA: Diagnosis not present

## 2017-03-19 DIAGNOSIS — M25551 Pain in right hip: Secondary | ICD-10-CM | POA: Diagnosis not present

## 2017-03-19 DIAGNOSIS — M25552 Pain in left hip: Secondary | ICD-10-CM | POA: Diagnosis not present

## 2017-03-21 DIAGNOSIS — M7521 Bicipital tendinitis, right shoulder: Secondary | ICD-10-CM | POA: Diagnosis not present

## 2017-03-21 DIAGNOSIS — M25551 Pain in right hip: Secondary | ICD-10-CM | POA: Diagnosis not present

## 2017-03-21 DIAGNOSIS — M25552 Pain in left hip: Secondary | ICD-10-CM | POA: Diagnosis not present

## 2017-03-21 DIAGNOSIS — M25511 Pain in right shoulder: Secondary | ICD-10-CM | POA: Diagnosis not present

## 2017-04-08 DIAGNOSIS — M791 Myalgia: Secondary | ICD-10-CM | POA: Diagnosis not present

## 2017-04-11 DIAGNOSIS — M25551 Pain in right hip: Secondary | ICD-10-CM | POA: Diagnosis not present

## 2017-04-11 DIAGNOSIS — M7521 Bicipital tendinitis, right shoulder: Secondary | ICD-10-CM | POA: Diagnosis not present

## 2017-04-11 DIAGNOSIS — M25552 Pain in left hip: Secondary | ICD-10-CM | POA: Diagnosis not present

## 2017-04-11 DIAGNOSIS — M25511 Pain in right shoulder: Secondary | ICD-10-CM | POA: Diagnosis not present

## 2017-04-12 DIAGNOSIS — M25551 Pain in right hip: Secondary | ICD-10-CM | POA: Diagnosis not present

## 2017-04-12 DIAGNOSIS — M25511 Pain in right shoulder: Secondary | ICD-10-CM | POA: Diagnosis not present

## 2017-04-12 DIAGNOSIS — M7521 Bicipital tendinitis, right shoulder: Secondary | ICD-10-CM | POA: Diagnosis not present

## 2017-04-12 DIAGNOSIS — M25552 Pain in left hip: Secondary | ICD-10-CM | POA: Diagnosis not present

## 2017-04-16 DIAGNOSIS — M25552 Pain in left hip: Secondary | ICD-10-CM | POA: Diagnosis not present

## 2017-04-16 DIAGNOSIS — M7521 Bicipital tendinitis, right shoulder: Secondary | ICD-10-CM | POA: Diagnosis not present

## 2017-04-16 DIAGNOSIS — M25551 Pain in right hip: Secondary | ICD-10-CM | POA: Diagnosis not present

## 2017-04-16 DIAGNOSIS — M25511 Pain in right shoulder: Secondary | ICD-10-CM | POA: Diagnosis not present

## 2017-04-19 DIAGNOSIS — M25552 Pain in left hip: Secondary | ICD-10-CM | POA: Diagnosis not present

## 2017-04-19 DIAGNOSIS — M25551 Pain in right hip: Secondary | ICD-10-CM | POA: Diagnosis not present

## 2017-04-19 DIAGNOSIS — M25511 Pain in right shoulder: Secondary | ICD-10-CM | POA: Diagnosis not present

## 2017-04-19 DIAGNOSIS — M7521 Bicipital tendinitis, right shoulder: Secondary | ICD-10-CM | POA: Diagnosis not present

## 2017-04-23 DIAGNOSIS — M7521 Bicipital tendinitis, right shoulder: Secondary | ICD-10-CM | POA: Diagnosis not present

## 2017-04-23 DIAGNOSIS — M25552 Pain in left hip: Secondary | ICD-10-CM | POA: Diagnosis not present

## 2017-04-23 DIAGNOSIS — M25511 Pain in right shoulder: Secondary | ICD-10-CM | POA: Diagnosis not present

## 2017-04-23 DIAGNOSIS — M25551 Pain in right hip: Secondary | ICD-10-CM | POA: Diagnosis not present

## 2017-04-25 DIAGNOSIS — M7521 Bicipital tendinitis, right shoulder: Secondary | ICD-10-CM | POA: Diagnosis not present

## 2017-04-25 DIAGNOSIS — M25551 Pain in right hip: Secondary | ICD-10-CM | POA: Diagnosis not present

## 2017-04-25 DIAGNOSIS — M25552 Pain in left hip: Secondary | ICD-10-CM | POA: Diagnosis not present

## 2017-04-25 DIAGNOSIS — M25511 Pain in right shoulder: Secondary | ICD-10-CM | POA: Diagnosis not present

## 2017-04-30 DIAGNOSIS — M25511 Pain in right shoulder: Secondary | ICD-10-CM | POA: Diagnosis not present

## 2017-04-30 DIAGNOSIS — M25552 Pain in left hip: Secondary | ICD-10-CM | POA: Diagnosis not present

## 2017-04-30 DIAGNOSIS — M7521 Bicipital tendinitis, right shoulder: Secondary | ICD-10-CM | POA: Diagnosis not present

## 2017-04-30 DIAGNOSIS — M25551 Pain in right hip: Secondary | ICD-10-CM | POA: Diagnosis not present

## 2017-05-02 DIAGNOSIS — M25552 Pain in left hip: Secondary | ICD-10-CM | POA: Diagnosis not present

## 2017-05-02 DIAGNOSIS — M25511 Pain in right shoulder: Secondary | ICD-10-CM | POA: Diagnosis not present

## 2017-05-02 DIAGNOSIS — M25551 Pain in right hip: Secondary | ICD-10-CM | POA: Diagnosis not present

## 2017-05-02 DIAGNOSIS — M7521 Bicipital tendinitis, right shoulder: Secondary | ICD-10-CM | POA: Diagnosis not present

## 2017-05-24 DIAGNOSIS — M25551 Pain in right hip: Secondary | ICD-10-CM | POA: Diagnosis not present

## 2017-05-24 DIAGNOSIS — M25511 Pain in right shoulder: Secondary | ICD-10-CM | POA: Diagnosis not present

## 2017-05-24 DIAGNOSIS — M25552 Pain in left hip: Secondary | ICD-10-CM | POA: Diagnosis not present

## 2017-05-24 DIAGNOSIS — M7521 Bicipital tendinitis, right shoulder: Secondary | ICD-10-CM | POA: Diagnosis not present

## 2017-05-28 DIAGNOSIS — M25551 Pain in right hip: Secondary | ICD-10-CM | POA: Diagnosis not present

## 2017-05-28 DIAGNOSIS — M7521 Bicipital tendinitis, right shoulder: Secondary | ICD-10-CM | POA: Diagnosis not present

## 2017-05-28 DIAGNOSIS — M25511 Pain in right shoulder: Secondary | ICD-10-CM | POA: Diagnosis not present

## 2017-05-28 DIAGNOSIS — M25552 Pain in left hip: Secondary | ICD-10-CM | POA: Diagnosis not present

## 2017-05-31 DIAGNOSIS — M25511 Pain in right shoulder: Secondary | ICD-10-CM | POA: Diagnosis not present

## 2017-05-31 DIAGNOSIS — M25552 Pain in left hip: Secondary | ICD-10-CM | POA: Diagnosis not present

## 2017-05-31 DIAGNOSIS — M7521 Bicipital tendinitis, right shoulder: Secondary | ICD-10-CM | POA: Diagnosis not present

## 2017-05-31 DIAGNOSIS — M25551 Pain in right hip: Secondary | ICD-10-CM | POA: Diagnosis not present

## 2017-06-04 DIAGNOSIS — M25552 Pain in left hip: Secondary | ICD-10-CM | POA: Diagnosis not present

## 2017-06-04 DIAGNOSIS — M7521 Bicipital tendinitis, right shoulder: Secondary | ICD-10-CM | POA: Diagnosis not present

## 2017-06-04 DIAGNOSIS — M25511 Pain in right shoulder: Secondary | ICD-10-CM | POA: Diagnosis not present

## 2017-06-04 DIAGNOSIS — M25551 Pain in right hip: Secondary | ICD-10-CM | POA: Diagnosis not present

## 2017-06-05 ENCOUNTER — Other Ambulatory Visit: Payer: Self-pay | Admitting: Gynecology

## 2017-06-05 DIAGNOSIS — Z1231 Encounter for screening mammogram for malignant neoplasm of breast: Secondary | ICD-10-CM

## 2017-06-06 ENCOUNTER — Ambulatory Visit
Admission: RE | Admit: 2017-06-06 | Discharge: 2017-06-06 | Disposition: A | Discharge status: Home / Self Care | Payer: Medicare Other | Source: Ambulatory Visit | Attending: Gynecology | Admitting: Gynecology

## 2017-06-06 DIAGNOSIS — Z1231 Encounter for screening mammogram for malignant neoplasm of breast: Secondary | ICD-10-CM

## 2017-06-07 ENCOUNTER — Other Ambulatory Visit: Payer: Self-pay | Admitting: Gynecology

## 2017-06-07 DIAGNOSIS — M25511 Pain in right shoulder: Secondary | ICD-10-CM | POA: Diagnosis not present

## 2017-06-07 DIAGNOSIS — M25552 Pain in left hip: Secondary | ICD-10-CM | POA: Diagnosis not present

## 2017-06-07 DIAGNOSIS — M25551 Pain in right hip: Secondary | ICD-10-CM | POA: Diagnosis not present

## 2017-06-07 DIAGNOSIS — M7521 Bicipital tendinitis, right shoulder: Secondary | ICD-10-CM | POA: Diagnosis not present

## 2017-06-07 DIAGNOSIS — R928 Other abnormal and inconclusive findings on diagnostic imaging of breast: Secondary | ICD-10-CM

## 2017-06-17 ENCOUNTER — Other Ambulatory Visit: Payer: Self-pay | Admitting: Gynecology

## 2017-06-17 ENCOUNTER — Ambulatory Visit
Admission: RE | Admit: 2017-06-17 | Discharge: 2017-06-17 | Disposition: A | Discharge status: Home / Self Care | Payer: Medicare Other | Source: Ambulatory Visit | Attending: Gynecology | Admitting: Gynecology

## 2017-06-17 DIAGNOSIS — E7800 Pure hypercholesterolemia, unspecified: Secondary | ICD-10-CM | POA: Diagnosis not present

## 2017-06-17 DIAGNOSIS — N6310 Unspecified lump in the right breast, unspecified quadrant: Secondary | ICD-10-CM

## 2017-06-17 DIAGNOSIS — R922 Inconclusive mammogram: Secondary | ICD-10-CM | POA: Diagnosis not present

## 2017-06-17 DIAGNOSIS — R928 Other abnormal and inconclusive findings on diagnostic imaging of breast: Secondary | ICD-10-CM

## 2017-06-17 DIAGNOSIS — F419 Anxiety disorder, unspecified: Secondary | ICD-10-CM | POA: Diagnosis not present

## 2017-06-17 DIAGNOSIS — R921 Mammographic calcification found on diagnostic imaging of breast: Secondary | ICD-10-CM

## 2017-06-17 DIAGNOSIS — Z23 Encounter for immunization: Secondary | ICD-10-CM | POA: Diagnosis not present

## 2017-06-17 DIAGNOSIS — F324 Major depressive disorder, single episode, in partial remission: Secondary | ICD-10-CM | POA: Diagnosis not present

## 2017-06-17 DIAGNOSIS — Z6829 Body mass index (BMI) 29.0-29.9, adult: Secondary | ICD-10-CM | POA: Diagnosis not present

## 2017-06-17 DIAGNOSIS — N6489 Other specified disorders of breast: Secondary | ICD-10-CM | POA: Diagnosis not present

## 2017-06-18 ENCOUNTER — Other Ambulatory Visit: Payer: Self-pay | Admitting: Gynecology

## 2017-06-18 DIAGNOSIS — M25552 Pain in left hip: Secondary | ICD-10-CM | POA: Diagnosis not present

## 2017-06-18 DIAGNOSIS — M25551 Pain in right hip: Secondary | ICD-10-CM | POA: Diagnosis not present

## 2017-06-18 DIAGNOSIS — M25511 Pain in right shoulder: Secondary | ICD-10-CM | POA: Diagnosis not present

## 2017-06-18 DIAGNOSIS — N6310 Unspecified lump in the right breast, unspecified quadrant: Secondary | ICD-10-CM

## 2017-06-18 DIAGNOSIS — R928 Other abnormal and inconclusive findings on diagnostic imaging of breast: Secondary | ICD-10-CM

## 2017-06-18 DIAGNOSIS — M7521 Bicipital tendinitis, right shoulder: Secondary | ICD-10-CM | POA: Diagnosis not present

## 2017-06-19 ENCOUNTER — Ambulatory Visit
Admission: RE | Admit: 2017-06-19 | Discharge: 2017-06-19 | Disposition: A | Discharge status: Home / Self Care | Payer: Medicare Other | Source: Ambulatory Visit | Attending: Gynecology | Admitting: Gynecology

## 2017-06-19 DIAGNOSIS — C50211 Malignant neoplasm of upper-inner quadrant of right female breast: Secondary | ICD-10-CM | POA: Diagnosis not present

## 2017-06-19 DIAGNOSIS — N6310 Unspecified lump in the right breast, unspecified quadrant: Secondary | ICD-10-CM

## 2017-06-19 DIAGNOSIS — R928 Other abnormal and inconclusive findings on diagnostic imaging of breast: Secondary | ICD-10-CM

## 2017-06-19 DIAGNOSIS — R921 Mammographic calcification found on diagnostic imaging of breast: Secondary | ICD-10-CM

## 2017-06-19 DIAGNOSIS — Z0389 Encounter for observation for other suspected diseases and conditions ruled out: Secondary | ICD-10-CM | POA: Diagnosis not present

## 2017-06-20 ENCOUNTER — Telehealth: Payer: Self-pay | Admitting: Hematology and Oncology

## 2017-06-20 ENCOUNTER — Other Ambulatory Visit: Payer: Self-pay | Admitting: Gynecology

## 2017-06-20 ENCOUNTER — Encounter: Payer: Self-pay | Admitting: Hematology and Oncology

## 2017-06-20 ENCOUNTER — Encounter: Payer: Self-pay | Admitting: *Deleted

## 2017-06-20 DIAGNOSIS — N63 Unspecified lump in unspecified breast: Secondary | ICD-10-CM

## 2017-06-20 DIAGNOSIS — N6489 Other specified disorders of breast: Secondary | ICD-10-CM

## 2017-06-20 NOTE — Progress Notes (Signed)
Patient confirmed [facility] appointment for 06/26/17, letter mailed, intake form patient will p/u @ [place]

## 2017-06-20 NOTE — Telephone Encounter (Signed)
LVM for patient to confirm afternoon appointment for [facility] on 06/26/17

## 2017-06-21 ENCOUNTER — Ambulatory Visit
Admission: RE | Admit: 2017-06-21 | Discharge: 2017-06-21 | Disposition: A | Discharge status: Home / Self Care | Payer: Medicare Other | Source: Ambulatory Visit | Attending: Gynecology | Admitting: Gynecology

## 2017-06-21 ENCOUNTER — Other Ambulatory Visit: Payer: Self-pay | Admitting: Gynecology

## 2017-06-21 DIAGNOSIS — N63 Unspecified lump in unspecified breast: Secondary | ICD-10-CM

## 2017-06-21 DIAGNOSIS — N6489 Other specified disorders of breast: Secondary | ICD-10-CM

## 2017-06-21 DIAGNOSIS — C773 Secondary and unspecified malignant neoplasm of axilla and upper limb lymph nodes: Secondary | ICD-10-CM | POA: Diagnosis not present

## 2017-06-21 DIAGNOSIS — C50211 Malignant neoplasm of upper-inner quadrant of right female breast: Secondary | ICD-10-CM | POA: Diagnosis not present

## 2017-06-21 DIAGNOSIS — R590 Localized enlarged lymph nodes: Secondary | ICD-10-CM | POA: Diagnosis not present

## 2017-06-25 ENCOUNTER — Other Ambulatory Visit: Payer: Self-pay | Admitting: *Deleted

## 2017-06-25 DIAGNOSIS — C50211 Malignant neoplasm of upper-inner quadrant of right female breast: Secondary | ICD-10-CM

## 2017-06-25 DIAGNOSIS — Z170 Estrogen receptor positive status [ER+]: Principal | ICD-10-CM

## 2017-06-26 ENCOUNTER — Encounter: Payer: Self-pay | Admitting: General Practice

## 2017-06-26 ENCOUNTER — Other Ambulatory Visit (HOSPITAL_BASED_OUTPATIENT_CLINIC_OR_DEPARTMENT_OTHER): Payer: Medicare Other

## 2017-06-26 ENCOUNTER — Ambulatory Visit: Payer: Medicare Other | Attending: General Surgery | Admitting: Physical Therapy

## 2017-06-26 ENCOUNTER — Ambulatory Visit (HOSPITAL_BASED_OUTPATIENT_CLINIC_OR_DEPARTMENT_OTHER): Payer: Medicare Other | Admitting: Hematology and Oncology

## 2017-06-26 ENCOUNTER — Encounter: Payer: Self-pay | Admitting: Radiation Oncology

## 2017-06-26 ENCOUNTER — Ambulatory Visit
Admission: RE | Admit: 2017-06-26 | Discharge: 2017-06-26 | Disposition: A | Discharge status: Home / Self Care | Payer: Medicare Other | Source: Ambulatory Visit | Attending: Radiation Oncology | Admitting: Radiation Oncology

## 2017-06-26 ENCOUNTER — Encounter: Payer: Self-pay | Admitting: Hematology and Oncology

## 2017-06-26 ENCOUNTER — Encounter: Payer: Self-pay | Admitting: Physical Therapy

## 2017-06-26 DIAGNOSIS — C50211 Malignant neoplasm of upper-inner quadrant of right female breast: Secondary | ICD-10-CM

## 2017-06-26 DIAGNOSIS — Z170 Estrogen receptor positive status [ER+]: Principal | ICD-10-CM

## 2017-06-26 DIAGNOSIS — C773 Secondary and unspecified malignant neoplasm of axilla and upper limb lymph nodes: Secondary | ICD-10-CM | POA: Diagnosis not present

## 2017-06-26 DIAGNOSIS — Z803 Family history of malignant neoplasm of breast: Secondary | ICD-10-CM

## 2017-06-26 DIAGNOSIS — R293 Abnormal posture: Secondary | ICD-10-CM

## 2017-06-26 LAB — CBC WITH DIFFERENTIAL/PLATELET
BASO%: 0.5 % (ref 0.0–2.0)
Basophils Absolute: 0 10*3/uL (ref 0.0–0.1)
EOS%: 3.5 % (ref 0.0–7.0)
Eosinophils Absolute: 0.2 10*3/uL (ref 0.0–0.5)
HEMATOCRIT: 39.7 % (ref 34.8–46.6)
HGB: 13.1 g/dL (ref 11.6–15.9)
LYMPH#: 1.9 10*3/uL (ref 0.9–3.3)
LYMPH%: 32.1 % (ref 14.0–49.7)
MCH: 29.1 pg (ref 25.1–34.0)
MCHC: 33 g/dL (ref 31.5–36.0)
MCV: 88.2 fL (ref 79.5–101.0)
MONO#: 0.5 10*3/uL (ref 0.1–0.9)
MONO%: 8.5 % (ref 0.0–14.0)
NEUT%: 55.4 % (ref 38.4–76.8)
NEUTROS ABS: 3.2 10*3/uL (ref 1.5–6.5)
PLATELETS: 257 10*3/uL (ref 145–400)
RBC: 4.5 10*6/uL (ref 3.70–5.45)
RDW: 13.2 % (ref 11.2–14.5)
WBC: 5.8 10*3/uL (ref 3.9–10.3)

## 2017-06-26 LAB — COMPREHENSIVE METABOLIC PANEL
ALK PHOS: 89 U/L (ref 40–150)
ALT: 16 U/L (ref 0–55)
ANION GAP: 9 meq/L (ref 3–11)
AST: 17 U/L (ref 5–34)
Albumin: 4 g/dL (ref 3.5–5.0)
BILIRUBIN TOTAL: 0.39 mg/dL (ref 0.20–1.20)
BUN: 15.9 mg/dL (ref 7.0–26.0)
CALCIUM: 9.7 mg/dL (ref 8.4–10.4)
CO2: 24 mEq/L (ref 22–29)
CREATININE: 0.9 mg/dL (ref 0.6–1.1)
Chloride: 103 mEq/L (ref 98–109)
EGFR: 65 mL/min/{1.73_m2} — ABNORMAL LOW (ref >90)
Glucose: 111 mg/dl (ref 70–140)
Potassium: 3.9 mEq/L (ref 3.5–5.1)
Sodium: 137 mEq/L (ref 136–145)
TOTAL PROTEIN: 7.7 g/dL (ref 6.4–8.3)

## 2017-06-26 MED ORDER — ANASTROZOLE 1 MG PO TABS: 1.0000 mg | ORAL_TABLET | Freq: Every day | ORAL | 0 refills | Status: DC

## 2017-06-26 NOTE — Patient Instructions (Signed)

## 2017-06-26 NOTE — Progress Notes (Signed)
Nutrition Assessment  Reason for Assessment:  Pt seen in Breast Clinic  ASSESSMENT:   71 year old female with new diagnosis of breast cancer.  Past medical history reviewed.  Patient reports normal appetite.  Patient interested in losing weight.  Medications:  reviewed  Labs: reviewed  Anthropometrics:   Height: 61 inches Weight: 149 lb BMI: 28.2   NUTRITION DIAGNOSIS: Food and nutrition related knowledge deficit related to new diagnosis of breast cancer as evidenced by no prior need for nutrition related information.  INTERVENTION:   Discussed and provided packet of information regarding nutritional tips for breast cancer patients.  Questions answered.  Teachback method used.  Contact information provided and patient knows to contact me with questions/concerns.    MONITORING, EVALUATION, and GOAL: Pt will consume a healthy plant based diet to maintain lean body mass throughout treatment.   Janney Priego B. Zenia Resides, [place], [place] Registered Dietitian [phone] (pager)

## 2017-06-26 NOTE — Therapy (Signed)
[facility] [address] [place], [place], [address] Phone: [phone]   Fax:  [phone]  Physical Therapy Evaluation  Patient Details  Name: Gloria Ware MRN: [id number] Date of Birth: 02/03/1946 Referring Provider: Dr. Faera Byerly  Encounter Date: 06/26/2017      PT End of Session - 06/26/17 2113    Visit Number 1   Number of Visits 2   Date for PT Re-Evaluation --  Will reassess pt 3 weeks post op for PT needs   PT Start Time 1322   PT Stop Time 1350  Also saw pt from 1454-1510 for a total of 44 minutes   PT Time Calculation (min) 28 min   Activity Tolerance Patient tolerated treatment well   Behavior During Therapy WFL for tasks assessed/performed      Past Medical History:  Diagnosis Date  . Anxiety   . Colon polyp 09/12/2005  . Depression   . Hearing loss   . High cholesterol   . Osteopenia 11/2016   T score -2.1 stable from prior DEXA  . Tinnitus     Past Surgical History:  Procedure Laterality Date  . CARDIAC CATHETERIZATION    . cataract surg    . DILATION AND CURETTAGE OF UTERUS    . MOUTH SURGERY      There were no vitals filed for this visit.       Subjective Assessment - 06/26/17 2059    Subjective Patient reports she is here today to be seen by her medical team for her newly diagnosed right breast cancer.   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 It measures 1 cm and is located in the upper inner quadrant. 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   Patient Stated Goals Reduce lymphedema risk and learn post op shoulder ROM HEP   Currently in Pain? No/denies            [facility] PT Assessment - 06/26/17 0001      Assessment   Medical Diagnosis Right breast cancer   Referring  Provider Dr. Faera Byerly   Onset Date/Surgical Date 06/06/17   Hand Dominance Right   Prior Therapy none     Precautions   Precautions Other (comment)   Precaution Comments active cancer     Restrictions   Weight Bearing Restrictions No     Balance Screen   Has the patient fallen in the past 6 months No  She reports she has fallen out of bed multiple x (dreaming)   Has the patient had a decrease in activity level because of a fear of falling?  No   Is the patient reluctant to leave their home because of a fear of falling?  No     Home Environment   Living Environment Private residence   Living Arrangements Spouse/significant other   Available Help at Discharge Family     Prior Function   Level of Independence Independent   Vocation Retired   Vocation Requirements Plays in [employer]   Leisure She rides a bike at the gym 3-4x/wk     Cognition   Overall Cognitive Status Within Functional Limits for tasks assessed     Posture/Postural Control   Posture/Postural Control Postural limitations   Postural Limitations Rounded Shoulders;Forward head       ROM / Strength   AROM / PROM / Strength AROM;Strength     AROM   AROM Assessment Site Shoulder;Cervical   Right/Left Shoulder Right;Left   Right Shoulder Extension 46 Degrees   Right Shoulder Flexion 130 Degrees   Right Shoulder ABduction 137 Degrees   Right Shoulder Internal Rotation 63 Degrees   Right Shoulder External Rotation 64 Degrees   Left Shoulder Extension 40 Degrees   Left Shoulder Flexion 128 Degrees   Left Shoulder ABduction 163 Degrees   Left Shoulder Internal Rotation 64 Degrees   Left Shoulder External Rotation 75 Degrees   Cervical Flexion WNL   Cervical Extension WNL   Cervical - Right Side Bend WNL   Cervical - Left Side Bend WNL   Cervical - Right Rotation WNL   Cervical - Left Rotation WNL     Strength   Overall Strength Within functional limits for tasks performed            LYMPHEDEMA/ONCOLOGY QUESTIONNAIRE - 06/26/17 2107      Type   Cancer Type Right breast cancer     Lymphedema Assessments   Lymphedema Assessments Upper extremities     Right Upper Extremity Lymphedema   10 cm Proximal to Olecranon Process 28.6 cm   Olecranon Process 22.8 cm   10 cm Proximal to Ulnar Styloid Process 21.1 cm   Just Proximal to Ulnar Styloid Process 13.6 cm   Across Hand at [employer] 16.6 cm   At [place] of 2nd Digit 5.5 cm     Left Upper Extremity Lymphedema   10 cm Proximal to Olecranon Process 29.4 cm   Olecranon Process 22.5 cm   10 cm Proximal to Ulnar Styloid Process 20.2 cm   Just Proximal to Ulnar Styloid Process 13.4 cm   Across Hand at [employer] 16 cm   At [place] of 2nd Digit 5.6 cm         Objective measurements completed on examination: See above findings.            Patient was instructed today in a home exercise program today for post op shoulder range of motion. These included active assist shoulder flexion in sitting, scapular retraction, wall walking with shoulder abduction, and hands behind head external rotation.  She was encouraged to do these twice a day, holding 3 seconds and repeating 5 times when permitted by her physician.          PT Education - 06/26/17 2113    Education provided Yes   Education Details Lymphedema risk reduction and post op shoulder ROM HEP   Person(s) Educated Patient;Spouse   Methods Explanation;Demonstration;Handout   Comprehension Returned demonstration;Verbalized understanding              Breast Clinic Goals - 06/26/17 2119      Patient will be able to verbalize understanding of pertinent lymphedema risk reduction practices relevant to her diagnosis specifically related to skin care.   Time 1   Period Days   Status Achieved     Patient will be able to return demonstrate and/or verbalize understanding of the post-op home exercise program related to regaining shoulder range of  motion.   Time 1   Period Days   Status Achieved     Patient will be able to verbalize understanding of the importance of attending the postoperative After Breast Cancer Class for further lymphedema risk reduction education and therapeutic exercise.   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 It measures 1 cm and is located in the upper inner quadrant. She had an axillary node biopsied which was positive. 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 Her multidisciplinary medical team met prior to her assessments to determine a recommended treatment plan. She is planning to have a right lumpectomy and targeted axillary node dissection, Mammaprint testing, radiation and anti-estrogen therapy. She may benefit from post op PT to regain shoulder ROM and reduce lymphedema risk.   History and Personal Factors relevant to plan of care: Recently diagnosed with cancer; appears very anxious; leaving for 2 week trip to [place] next week   Clinical Presentation Evolving   Clinical Presentation due to: Scheduled for MRI to determine extent of disease; treatment plan may change with MRI results or Mammaprint results.   Clinical Decision Making Moderate   Rehab Potential Excellent   Clinical Impairments Affecting Rehab Potential None   PT Frequency --  2 visits - will reassess pt at 3 weeks post op   PT Treatment/Interventions Therapeutic exercise;Patient/family education   PT Next Visit Plan Will reassess pt at 3 weeks post op to determine PT needs   PT Home Exercise Plan post op shoulder ROM HEP   Consulted and Agree with Plan of Care Patient;Family member/caregiver   Family Member Consulted Husband      Patient will benefit from skilled therapeutic  intervention in order to improve the following deficits and impairments:  Postural dysfunction, Decreased knowledge of precautions, Pain, Impaired UE functional use, Decreased range of motion  Visit Diagnosis: Carcinoma of upper-inner quadrant of right breast in female, estrogen receptor positive (HCC) - Plan: PT plan of care cert/re-cert  Abnormal posture - Plan: PT plan of care cert/re-cert      G-Codes - 06/26/17 2119    Functional Assessment Tool Used (Outpatient Only) Clinical Judgement   Functional Limitation Other PT primary   Other PT Primary Current Status (G8990) At least 1 percent but less than 20 percent impaired, limited or restricted   Other PT Primary Goal Status (G8991) At least 1 percent but less than 20 percent impaired, limited or restricted     Patient will follow up at outpatient cancer rehab if needed following surgery.  If the patient requires physical therapy at that time, a specific plan will be dictated and sent to the referring physician for approval. The patient was educated today on appropriate basic range of motion exercises to begin post operatively and the importance of attending the After Breast Cancer class following surgery.  Patient was educated today on lymphedema risk reduction practices as it pertains to recommendations that will benefit the patient immediately following surgery.  She verbalized good understanding.  No additional physical therapy is indicated at this time.     Problem List Patient Active Problem List   Diagnosis Date Noted  . Malignant neoplasm of upper-inner quadrant of right breast in female, estrogen receptor positive (HCC) 06/26/2017    Marti Cooper Smith, PT 06/26/17 9:21 PM  [facility] [address] [place], [place], [address] Phone: [phone]   Fax:  [phone]  Name: Gloria Ware MRN: [id number] Date of Birth: 10/27/1945  

## 2017-06-26 NOTE — Progress Notes (Signed)
Radiation Oncology         [phone] _____________________________  Name: Gloria Ware        MRN: [id number]  Date of Service: 06/26/2017 DOB: 1946-06-05  [id number], Herbie [place], MD  [name], MD     REFERRING PHYSICIAN: [name], MD   DIAGNOSIS: The encounter diagnosis was Malignant neoplasm of upper-inner quadrant of right breast in female, estrogen receptor positive ([place]).   HISTORY OF PRESENT ILLNESS: Gloria Ware is a 71 y.o. female seen in the multidisciplinary breast clinic for a new diagnosis of right breast cancer. The patient was noted to have a mass seen as well as calcifications in the upper inner quadrant of the right on her screening mammogram. Diagnostic imaging revealed a 1 x .7 x . 7 cm mass at 1:00, as well as calcifications, and an abnormally thickened lymph node. 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 She comes today to review options of care for her cancer.  PREVIOUS RADIATION THERAPY: No  PAST MEDICAL HISTORY:  Past Medical History:  Diagnosis Date  . Anxiety   . Colon polyp 09/12/2005  . Depression   . Hearing loss   . High cholesterol   . Osteopenia 11/2016   T score -2.1 stable from prior DEXA  . Tinnitus        PAST SURGICAL HISTORY: Past Surgical History:  Procedure Laterality Date  . CARDIAC CATHETERIZATION    . cataract surg    . DILATION AND CURETTAGE OF UTERUS    . MOUTH SURGERY       FAMILY HISTORY:  Family History  Problem Relation Age of Onset  . Breast cancer Sister        post menopausal  . Hypertension Maternal Grandmother   . Stroke Maternal Grandmother   . Hypertension Maternal Grandfather   . Stroke Maternal Grandfather      SOCIAL HISTORY:  reports that she has never smoked. She has never used smokeless tobacco. She reports that she does not drink alcohol or use drugs. The patient is  married and lives in [place]. She is accompanied by her husband.    ALLERGIES: Penicillins   MEDICATIONS:  Current Outpatient Prescriptions  Medication Sig Dispense Refill  . acetaminophen (TYLENOL) 500 MG tablet Take 500 mg by mouth every 6 (six) hours as needed.    Marland Kitchen aspirin 81 MG tablet Take 81 mg by mouth daily.      . calcium carbonate (OS-CAL) 600 MG TABS Take 600 mg by mouth 2 (two) times daily with a meal.      . loratadine (CLARITIN) 10 MG tablet Take 10 mg by mouth as needed for allergies.    Marland Kitchen sertraline (ZOLOFT) 100 MG tablet Take 200 mg by mouth. 2 tabs qd     No current facility-administered medications for this encounter.      REVIEW OF SYSTEMS: On review of systems, the patient reports that she is doing well overall. She denies any chest pain, shortness of breath, cough, fevers, chills, night sweats, unintended weight changes. She denies any bowel or bladder disturbances, and denies abdominal pain, nausea or vomiting. She denies any new musculoskeletal or joint aches or pains. A complete review of systems is obtained and is otherwise negative.     PHYSICAL EXAM:  Wt Readings from Last 3 Encounters:  06/26/17 150 lb 4.8 oz (68.2 kg)  09/10/16 149 lb (67.6 kg)  08/23/15 156 lb (70.8 kg)   Temp Readings from Last 3 Encounters:  06/26/17 98.2 F (36.8 C) (Oral)   BP Readings from Last 3 Encounters:  06/26/17 131/67  09/10/16 114/66  08/23/15 112/70   Pulse Readings from Last 3 Encounters:  06/26/17 90     In general this is a well appearing caucasian female in no acute distress. She is alert and oriented x4 and appropriate throughout the examination. HEENT reveals that the patient is normocephalic, atraumatic. EOMs are intact. PERRLA. Skin is intact without any evidence of gross lesions. Cardiovascular exam reveals a regular rate and rhythm, no clicks rubs or murmurs are auscultated. Chest is clear to auscultation bilaterally. Lymphatic assessment is  performed and does not reveal any adenopathy in the cervical, supraclavicular, axillary, or inguinal chains. She has fullness along the  Bilateral breast exam is performed and reveals  Mild ecchymosis of the right breast. No palpable mass is noted otherwise or of the left. She has a small lipoma along the right shoulder.  Abdomen has active bowel sounds in all quadrants and is intact. The abdomen is soft, non tender, non distended. Lower extremities are negative for pretibial pitting edema, deep calf tenderness, cyanosis or clubbing.   ECOG = 0  0 - Asymptomatic (Fully active, able to carry on all predisease activities without restriction)  1 - Symptomatic but completely ambulatory (Restricted in physically strenuous activity but ambulatory and able to carry out work of a light or sedentary nature. For example, light housework, office work)  2 - Symptomatic, <50% in bed during the day (Ambulatory and capable of all self care but unable to carry out any work activities. Up and about more than 50% of waking hours)  3 - Symptomatic, >50% in bed, but not bedbound (Capable of only limited self-care, confined to bed or chair 50% or more of waking hours)  4 - Bedbound (Completely disabled. Cannot carry on any self-care. Totally confined to bed or chair)  5 - Death   Eustace Pen MM, Creech RH, Tormey DC, et al. [phone]). "Toxicity and response criteria of the [facility]". [place] Oncol. 5 (6): 649-55    LABORATORY DATA:  Lab Results  Component Value Date   WBC 5.8 06/26/2017   HGB 13.1 06/26/2017   HCT 39.7 06/26/2017   MCV 88.2 06/26/2017   PLT 257 06/26/2017   Lab Results  Component Value Date   NA 137 06/26/2017   K 3.9 06/26/2017   CO2 24 06/26/2017   Lab Results  Component Value Date   ALT 16 06/26/2017   AST 17 06/26/2017   ALKPHOS 89 06/26/2017   BILITOT 0.39 06/26/2017      RADIOGRAPHY: US Breast Ltd Uni Right Inc Axilla  Result Date:  06/17/2017 CLINICAL DATA:  The patient was called back for possible right breast mass. EXAM: 2D DIGITAL DIAGNOSTIC RIGHT MAMMOGRAM WITH ADJUNCT TOMO ULTRASOUND RIGHT BREAST COMPARISON:  Previous exam(s). ACR Breast Density Category c: The breast tissue is heterogeneously dense, which may obscure small masses. FINDINGS: The possible right breast mass persists on additional imaging. There is an adjacent group of calcifications located superiorly and laterally in the right breast. On physical exam, no suspicious lumps are identified. Targeted ultrasound is performed, showing a complicated pattern to the breast making evaluation with ultrasound difficult. However, there is an irregular masslike region at 1 o'clock, 1 cm from the nipple measuring 7 x 10 x 7 mm. This  contains calcifications and is thought to represent the mass/ distortion seen mammographically. There is a lymph node in the right axilla which demonstrates a large fatty hilum. The cortex is prominent, measuring up to 3.2 mm along 1 end. IMPRESSION: There is an irregular mass/distortion in the right breast seen mammographically which is thought to correlate with the finding at 1 o'clock, 1 cm from the nipple seen sonographically. There is an adjacent group of calcifications located more laterally and superiorly. These findings are indeterminate. There is a borderline lymph node in the right axilla. RECOMMENDATION: Recommend ultrasound-guided biopsy of the 1 o'clock right breast mass. Recommend stereotactic biopsy of the more laterally and superiorly located group of calcifications. If either of these biopsies is positive, recommend bringing the patient back for biopsy of the right axillary lymph node. I have discussed the findings and recommendations with the patient. Results were also provided in writing at the conclusion of the visit. If applicable, a reminder letter will be sent to the patient regarding the next appointment. BI-RADS CATEGORY  4:  Suspicious. Electronically Signed   By: Dorise Bullion III M.D   On: 06/17/2017 16:40   Mm Diag Breast Tomo Uni Right  Result Date: 06/17/2017 CLINICAL DATA:  The patient was called back for possible right breast mass. EXAM: 2D DIGITAL DIAGNOSTIC RIGHT MAMMOGRAM WITH ADJUNCT TOMO ULTRASOUND RIGHT BREAST COMPARISON:  Previous exam(s). ACR Breast Density Category c: The breast tissue is heterogeneously dense, which may obscure small masses. FINDINGS: The possible right breast mass persists on additional imaging. There is an adjacent group of calcifications located superiorly and laterally in the right breast. On physical exam, no suspicious lumps are identified. Targeted ultrasound is performed, showing a complicated pattern to the breast making evaluation with ultrasound difficult. However, there is an irregular masslike region at 1 o'clock, 1 cm from the nipple measuring 7 x 10 x 7 mm. This contains calcifications and is thought to represent the mass/ distortion seen mammographically. There is a lymph node in the right axilla which demonstrates a large fatty hilum. The cortex is prominent, measuring up to 3.2 mm along 1 end. IMPRESSION: There is an irregular mass/distortion in the right breast seen mammographically which is thought to correlate with the finding at 1 o'clock, 1 cm from the nipple seen sonographically. There is an adjacent group of calcifications located more laterally and superiorly. These findings are indeterminate. There is a borderline lymph node in the right axilla. RECOMMENDATION: Recommend ultrasound-guided biopsy of the 1 o'clock right breast mass. Recommend stereotactic biopsy of the more laterally and superiorly located group of calcifications. If either of these biopsies is positive, recommend bringing the patient back for biopsy of the right axillary lymph node. I have discussed the findings and recommendations with the patient. Results were also provided in writing at the conclusion  of the visit. If applicable, a reminder letter will be sent to the patient regarding the next appointment. BI-RADS CATEGORY  4: Suspicious. Electronically Signed   By: Dorise Bullion III M.D   On: 06/17/2017 16:40   Mm Screening Breast Tomo Bilateral  Result Date: 06/06/2017 CLINICAL DATA:  Screening. EXAM: 2D DIGITAL SCREENING BILATERAL MAMMOGRAM WITH CAD AND ADJUNCT TOMO COMPARISON:  Previous exam(s). ACR Breast Density Category c: The breast tissue is heterogeneously dense, which may obscure small masses. FINDINGS: In the right breast, a possible mass warrants further evaluation. In the left breast, no findings suspicious for malignancy. Images were processed with CAD. IMPRESSION: Further evaluation is suggested for  possible mass in the right breast. RECOMMENDATION: Diagnostic mammogram and possibly ultrasound of the right breast. (Code:[id number]) The patient will be contacted regarding the findings, and additional imaging will be scheduled. BI-RADS CATEGORY  0: Incomplete. Need additional imaging evaluation and/or prior mammograms for comparison. Electronically Signed   By: Nolon Nations M.D.   On: 06/06/2017 14:56   [name] Axillary Node Core Biopsy Right  Result Date: 06/21/2017 CLINICAL DATA:  Patient presents for ultrasound-guided core biopsy of enlarged right axillary lymph node. Recently diagnosed with invasive lobular carcinoma at 2 sites in the right breast. EXAM: ULTRASOUND GUIDED CORE NEEDLE BIOPSY OF A RIGHT AXILLARY NODE COMPARISON:  Previous exam(s). FINDINGS: I met with the patient and we discussed the procedure of ultrasound-guided biopsy, including benefits and alternatives. We discussed the high likelihood of a successful procedure. We discussed the risks of the procedure, including infection, bleeding, tissue injury, clip migration, and inadequate sampling. Informed written consent was given. The usual time-out protocol was performed immediately prior to the procedure. Using sterile  technique and 1% Lidocaine as local anesthetic, under direct ultrasound visualization, a 14 gauge spring-loaded device was used to perform biopsy of enlarged right axillary lymph node using a lateral approach. At the conclusion of the procedure a all spiral shaped HydroMARK tissue marker clip was deployed into the biopsy cavity. Follow up 2 view mammogram was performed and dictated separately. IMPRESSION: Ultrasound guided biopsy of right axillary lymph node. No apparent complications. Electronically Signed   By: Nolon Nations M.D.   On: 06/21/2017 12:52   Mm Clip Placement Right  Result Date: 06/21/2017 CLINICAL DATA:  Status post ultrasound-guided core biopsy of enlarged right axillary lymph node. EXAM: DIAGNOSTIC RIGHT MAMMOGRAM POST ULTRASOUND BIOPSY COMPARISON:  Previous exam(s). FINDINGS: Mammographic images were obtained following ultrasound guided biopsy of enlarged right axillary lymph node. A spiral shaped clip is identified in the lower right axilla following biopsy and clip placement. IMPRESSION: Tissue marker clip in the expected location following biopsy. Final Assessment: Post Procedure Mammograms for Marker Placement Electronically Signed   By: Nolon Nations M.D.   On: 06/21/2017 12:53   Mm Clip Placement Right  Result Date: 06/19/2017 CLINICAL DATA:  71 year old female status post ultrasound and stereotactic biopsies. EXAM: DIAGNOSTIC RIGHT MAMMOGRAM POST ULTRASOUND AND A STEREOTACTIC BIOPSY COMPARISON:  Previous exam(s). FINDINGS: Mammographic images were obtained following ultrasound guided biopsy of of a right breast mass/ distortion is stereotactic biopsy of right breast calcifications. A ribbon shaped clip is identified in the upper inner quadrant at middle depth and a coil shaped clip is identified in the upper outer quadrant at middle depth. These are in expected location status post 2 area of biopsy. IMPRESSION: Two biopsy clips in expected locations status post ultrasound and  stereotactic biopsies. Final Assessment: Post Procedure Mammograms for Marker Placement Electronically Signed   By: Kristopher Oppenheim M.D.   On: 06/19/2017 11:02   Mm Rt Breast Bx W Loc Dev 1st Lesion Image Bx Spec Stereo Guide  Addendum Date: 06/20/2017   ADDENDUM REPORT: 06/20/2017 15:50 ADDENDUM: Pathology revealed GRADE II INVASIVE AND IN SITU MAMMARY CARCINOMA WITH CALCIFICATIONS of the Right breast, both locations, 1:00 o'clock 1 cm fn and upper inner anterior depth. Of note, much of the carcinoma has a pleomorphic lobular phenotype. This was found to be concordant by Dr. Kristopher Oppenheim. Pathology results were discussed with the patient by telephone. The patient reported doing well after the biopsies with tenderness at the sites. Post biopsy instructions and care were  reviewed and questions were answered. The patient was encouraged to call [facility] for any additional concerns. The patient was referred to [facility] on June 26, 2017. Recommendation for bilateral breast MRI due to the lobular histology. Pathology results reported by Terie Purser, RN on 06/20/2017. Electronically Signed   By: Kristopher Oppenheim M.D.   On: 06/20/2017 15:50   Result Date: 06/20/2017 CLINICAL DATA:  71 year old female status post ultrasound-guided biopsy of right breast mass/distortion and stereotactic biopsy of right breast calcifications. EXAM: ULTRASOUND GUIDED RIGHT BREAST CORE NEEDLE BIOPSY STEREOTACTIC GUIDED RIGHT BREAST CORE NEEDLE BIOPSY COMPARISON:  Previous exam(s). FINDINGS: I met with the patient and we discussed the procedure of ultrasound-guided and stereotactic biopsy, including benefits and alternatives. We discussed the high likelihood of a successful procedure. We discussed the risks of the procedure, including infection, bleeding, tissue injury, clip migration, and inadequate sampling. Informed written  consent was given. The usual time-out protocol was performed immediately prior to the procedure. Using sterile technique and 1% Lidocaine as local anesthetic, under direct ultrasound visualization, a 14 gauge spring-loaded device was used to perform biopsy of an irregular mass/distortion using a medial approach. At the conclusion of the procedure a ribbon shaped tissue marker clip was deployed into the biopsy cavity. Follow up 2 view mammogram was performed and dictated separately. Lesion quadrant: Upper quadrant Using sterile technique and 1% Lidocaine as local anesthetic, under stereotactic guidance, a 9 gauge vacuum assisted device was used to perform core needle biopsy of upper inner quadrant at middle depth using a superior approach. Specimen radiograph was performed showing calcifications and multiple samples. Specimens with calcifications are identified for pathology. Lesion quadrant: Upper inner quadrant At the conclusion of the procedure, a coil shaped tissue marker clip was deployed into the biopsy cavity. Follow-up 2-view mammogram was performed and dictated separately. IMPRESSION: Ultrasound guided biopsy of of a right breast mass/ distortion and stereotactic biopsy of right breast calcifications. No apparent complications. Electronically Signed: By: Kristopher Oppenheim M.D. On: 06/19/2017 11:00   [name] Rt Breast Bx W Loc Dev 1st Lesion Img Bx Spec US Guide  Addendum Date: 06/20/2017   ADDENDUM REPORT: 06/20/2017 15:50 ADDENDUM: Pathology revealed GRADE II INVASIVE AND IN SITU MAMMARY CARCINOMA WITH CALCIFICATIONS of the Right breast, both locations, 1:00 o'clock 1 cm fn and upper inner anterior depth. Of note, much of the carcinoma has a pleomorphic lobular phenotype. This was found to be concordant by Dr. Kristopher Oppenheim. Pathology results were discussed with the patient by telephone. The patient reported doing well after the biopsies with tenderness at the sites. Post biopsy instructions and care were  reviewed and questions were answered. The patient was encouraged to call [facility] for any additional concerns. The patient was referred to [facility] on June 26, 2017. Recommendation for bilateral breast MRI due to the lobular histology. Pathology results reported by Terie Purser, RN on 06/20/2017. Electronically Signed   By: Kristopher Oppenheim M.D.   On: 06/20/2017 15:50   Result Date: 06/20/2017 CLINICAL DATA:  71 year old female status post ultrasound-guided biopsy of right breast mass/distortion and stereotactic biopsy of right breast calcifications. EXAM: ULTRASOUND GUIDED RIGHT BREAST CORE NEEDLE BIOPSY STEREOTACTIC GUIDED RIGHT BREAST CORE NEEDLE BIOPSY COMPARISON:  Previous exam(s). FINDINGS: I met with the patient and we discussed the procedure of ultrasound-guided and stereotactic biopsy, including  benefits and alternatives. We discussed the high likelihood of a successful procedure. We discussed the risks of the procedure, including infection, bleeding, tissue injury, clip migration, and inadequate sampling. Informed written consent was given. The usual time-out protocol was performed immediately prior to the procedure. Using sterile technique and 1% Lidocaine as local anesthetic, under direct ultrasound visualization, a 14 gauge spring-loaded device was used to perform biopsy of an irregular mass/distortion using a medial approach. At the conclusion of the procedure a ribbon shaped tissue marker clip was deployed into the biopsy cavity. Follow up 2 view mammogram was performed and dictated separately. Lesion quadrant: Upper quadrant Using sterile technique and 1% Lidocaine as local anesthetic, under stereotactic guidance, a 9 gauge vacuum assisted device was used to perform core needle biopsy of upper inner quadrant at middle depth using a superior approach. Specimen radiograph was performed  showing calcifications and multiple samples. Specimens with calcifications are identified for pathology. Lesion quadrant: Upper inner quadrant At the conclusion of the procedure, a coil shaped tissue marker clip was deployed into the biopsy cavity. Follow-up 2-view mammogram was performed and dictated separately. IMPRESSION: Ultrasound guided biopsy of of a right breast mass/ distortion and stereotactic biopsy of right breast calcifications. No apparent complications. Electronically Signed: By: Kristopher Oppenheim M.D. On: 06/19/2017 11:00       IMPRESSION/PLAN: 1. Stage IB, [id number], grade 2, ER/PR positive invasive lobular carcinoma with pleomorphic changes of the right breast. Dr. Lisbeth Renshaw discusses the pathology findings and reviews the nature of invasive lobular disease. The consensus from the breast conference includes proceeding with an MRI of the breasts and from this, determining  conservation with lumpectomy with sentinel mapping versus mastectomy sentinel node evaluation. Her tumor will be tested for mammaprint, if high risk and depending on lymphatic involvement, she may proceed with chemotherapy. Either following chemotherapy or if no chemo needed, her course would be followed by external radiotherapy to the breast followed by antiestrogen therapy. We discussed the risks, benefits, short, and long term effects of radiotherapy, and the patient is interested in proceeding. Dr. Lisbeth Renshaw discusses the delivery and logistics of radiotherapy and would anticipate a 6 1/2 week course of treatment. We will see her back at the appropriate time for discussion of adjuvant radiation. 2. Possible genetic predisposition to malignancy. The patient's personal history as well as Ashkenazi Jewish descent, and sister's history qualify her for genetic testing. She will be referred for discussion for genetic testing.    The above documentation reflects my direct findings during this shared patient visit. Please see the separate  note by Dr. Lisbeth Renshaw on this date for the remainder of the patient's plan of care.    Carola Rhine, PAC

## 2017-06-27 ENCOUNTER — Encounter: Payer: Self-pay | Admitting: Genetic Counselor

## 2017-06-27 ENCOUNTER — Encounter: Payer: Self-pay | Admitting: Hematology and Oncology

## 2017-06-27 ENCOUNTER — Encounter: Payer: Self-pay | Admitting: *Deleted

## 2017-06-27 ENCOUNTER — Ambulatory Visit (HOSPITAL_BASED_OUTPATIENT_CLINIC_OR_DEPARTMENT_OTHER): Payer: Medicare Other | Admitting: Genetic Counselor

## 2017-06-27 DIAGNOSIS — Z315 Encounter for genetic counseling: Secondary | ICD-10-CM

## 2017-06-27 DIAGNOSIS — C773 Secondary and unspecified malignant neoplasm of axilla and upper limb lymph nodes: Secondary | ICD-10-CM

## 2017-06-27 DIAGNOSIS — Z170 Estrogen receptor positive status [ER+]: Secondary | ICD-10-CM

## 2017-06-27 DIAGNOSIS — Z803 Family history of malignant neoplasm of breast: Secondary | ICD-10-CM | POA: Diagnosis not present

## 2017-06-27 DIAGNOSIS — Z8041 Family history of malignant neoplasm of ovary: Secondary | ICD-10-CM | POA: Diagnosis not present

## 2017-06-27 DIAGNOSIS — C50911 Malignant neoplasm of unspecified site of right female breast: Secondary | ICD-10-CM

## 2017-06-27 DIAGNOSIS — C50211 Malignant neoplasm of upper-inner quadrant of right female breast: Secondary | ICD-10-CM | POA: Diagnosis present

## 2017-06-27 DIAGNOSIS — Z1379 Encounter for other screening for genetic and chromosomal anomalies: Secondary | ICD-10-CM

## 2017-06-27 HISTORY — DX: Encounter for other screening for genetic and chromosomal anomalies: Z13.79

## 2017-06-27 NOTE — Progress Notes (Signed)
[facility] NOTE  Patient Care Team: [name], MD as PCP - General (Family Medicine) [name], MD as Consulting Physician (General Surgery) Nicholas Lose, MD as Consulting Physician (Hematology and Oncology) Kyung Rudd, MD as Consulting Physician (Radiation Oncology)  CHIEF COMPLAINTS/PURPOSE OF CONSULTATION:  Newly diagnosed breast cancer  HISTORY OF PRESENTING ILLNESS:  Gloria Ware 71 y.o. female is here because of recent diagnosis of right breast cancer. Patient had a screening mammogram that detected a right breast mass along with calcifications in the upper inner quadrant. She underwent ultrasound guided biopsy of the mass and stereotactic biopsy of the calcifications. She also had abdominal x-ray lymph node and underwent a biopsy with axillary lymph node as well. 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 She was presented this morning in the multidisciplinary tumor board and she is here today to discuss the treatment plan.  I reviewed her records extensively and collaborated the history with the patient.  SUMMARY OF ONCOLOGIC HISTORY:   Malignant neoplasm of upper-inner quadrant of right breast in female, estrogen receptor positive (Gloria Ware)   06/21/2017 Initial Diagnosis    Screening detected right breast mass and calcifications 1 cm at 1:00 position biopsy of mass and calcifications revealed invasive lobular cancer with pleomorphic features, grade 2, axillary lymph node biopsy positive for cancer, ER/PR positive HER-2 negative with a Ki-67 of 40%, T1b N1 stage IB AJCC 8       MEDICAL HISTORY:  Past Medical History:  Diagnosis Date  . Anxiety   . Colon polyp 09/12/2005  . Depression   . Hearing loss   . High cholesterol   . Osteopenia 11/2016   T score -2.1 stable from prior DEXA  . Tinnitus     SURGICAL HISTORY: Past Surgical  History:  Procedure Laterality Date  . CARDIAC CATHETERIZATION    . cataract surg    . DILATION AND CURETTAGE OF UTERUS    . MOUTH SURGERY      SOCIAL HISTORY: Social History   Social History  . Marital status: Married    Spouse name: N/A  . Number of children: N/A  . Years of education: N/A   Occupational History  . Not on file.   Social History Main Topics  . Smoking status: Never Smoker  . Smokeless tobacco: Never Used  . Alcohol use No     Comment: WINE - rarely  . Drug use: No  . Sexual activity: Yes    Partners: Male    Birth control/ protection: Post-menopausal     Comment: 1st intercourse 88 yo-1 partner   Other Topics Concern  . Not on file   Social History Narrative  . No narrative on file    FAMILY HISTORY: Family History  Problem Relation Age of Onset  . Breast cancer Sister        post menopausal  . Hypertension Maternal Grandmother   . Stroke Maternal Grandmother   . Hypertension Maternal Grandfather   . Stroke Maternal Grandfather     ALLERGIES:  is allergic to penicillins.  MEDICATIONS:  Current Outpatient Prescriptions  Medication Sig Dispense Refill  . acetaminophen (TYLENOL) 500 MG tablet Take 500 mg by mouth every 6 (six) hours as needed.    Marland Kitchen aspirin 81 MG tablet Take 81 mg by mouth daily.      . calcium carbonate (OS-CAL) 600 MG TABS  Take 600 mg by mouth 2 (two) times daily with a meal.      . loratadine (CLARITIN) 10 MG tablet Take 10 mg by mouth as needed for allergies.    Marland Kitchen sertraline (ZOLOFT) 100 MG tablet Take 200 mg by mouth. 2 tabs qd    . anastrozole (ARIMIDEX) 1 MG tablet Take 1 tablet (1 mg total) by mouth daily. 30 tablet 0   No current facility-administered medications for this visit.     REVIEW OF SYSTEMS:   Constitutional: Denies fevers, chills or abnormal night sweats Eyes: Denies blurriness of vision, double vision or watery eyes Ears, nose, mouth, throat, and face: Denies mucositis or sore throat Respiratory:  Denies cough, dyspnea or wheezes Cardiovascular: Denies palpitation, chest discomfort or lower extremity swelling Gastrointestinal:  Denies nausea, heartburn or change in bowel habits Skin: Denies abnormal skin rashes Lymphatics: Denies new lymphadenopathy or easy bruising Neurological:Denies numbness, tingling or new weaknesses Behavioral/Psych: Mood is stable, no new changes  Breast:  Denies any palpable lumps or discharge All other systems were reviewed with the patient and are negative.  PHYSICAL EXAMINATION: ECOG PERFORMANCE STATUS: 1 - Symptomatic but completely ambulatory  Vitals:   06/26/17 1307  BP: 131/67  Pulse: 90  Resp: 16  Temp: 98.2 F (36.8 C)  SpO2: 99%   Filed Weights   06/26/17 1307  Weight: 150 lb 4.8 oz (68.2 kg)    GENERAL:alert, no distress and comfortable SKIN: skin color, texture, turgor are normal, no rashes or significant lesions EYES: normal, conjunctiva are pink and non-injected, sclera clear OROPHARYNX:no exudate, no erythema and lips, buccal mucosa, and tongue normal  NECK: supple, thyroid normal size, non-tender, without nodularity LYMPH:  no palpable lymphadenopathy in the cervical, axillary or inguinal LUNGS: clear to auscultation and percussion with normal breathing effort HEART: regular rate & rhythm and no murmurs and no lower extremity edema ABDOMEN:abdomen soft, non-tender and normal bowel sounds Musculoskeletal:no cyanosis of digits and no clubbing  PSYCH: alert & oriented x 3 with fluent speech NEURO: no focal motor/sensory deficits BREAST: No palpable nodules in breast. No palpable axillary or supraclavicular lymphadenopathy (exam performed in the presence of a chaperone)   LABORATORY DATA:  I have reviewed the data as listed Lab Results  Component Value Date   WBC 5.8 06/26/2017   HGB 13.1 06/26/2017   HCT 39.7 06/26/2017   MCV 88.2 06/26/2017   PLT 257 06/26/2017   Lab Results  Component Value Date   NA 137 06/26/2017    K 3.9 06/26/2017   CO2 24 06/26/2017    RADIOGRAPHIC STUDIES: I have personally reviewed the radiological reports and agreed with the findings in the report.  ASSESSMENT AND PLAN:  Malignant neoplasm of upper-inner quadrant of right breast in female, estrogen receptor positive ([place]) 06/21/2017: Screening detected right breast mass and calcifications 1 cm at 1:00 position biopsy of mass and calcifications revealed invasive lobular cancer with pleomorphic features, grade 2, axillary lymph node biopsy positive for cancer, ER/PR positive HER-2 negative with a Ki-67 of 40%, T1b N1 stage IB AJCC 8  Pathology and radiology counseling:Discussed with the patient, the details of pathology including the type of breast cancer,the clinical staging, the significance of ER, PR and HER-2/neu receptors and the implications for treatment. After reviewing the pathology in detail, we proceeded to discuss the different treatment options between surgery, radiation, chemotherapy, antiestrogen therapies.  Recommendations: 1. Breast conserving surgery followed by 2. Mammaprint testing (if the number of involved lymph nodes is less  than 3) to determine if chemotherapy would be of any benefit followed by 3. Adjuvant radiation therapy followed by 4. Adjuvant antiestrogen therapy  Mammaprint counseling: MINDACT is a prospective, randomized phase III controlled trial that investigates the clinical utility of MammaPrint, when compared to standard clinical pathological criteria, with 6,693 patients enrolled from over 111 institutions. Clinical high-risk patients with a Low Risk MammaPrint result, including 48% node-positive, had 5-year distant metastasis-free survival rate in excess of 94 percent, whether randomized to receive adjuvant chemotherapy or not proving MammaPrint's ability to safely identify Low Risk patients.  Return to clinic after surgery to discuss final pathology report and then determine if Mammaprint testing  will need to be sent.   All questions were answered. The patient knows to call the clinic with any problems, questions or concerns.    Rulon Eisenmenger, MD 06/27/17

## 2017-06-27 NOTE — Progress Notes (Signed)
[facility]      Initial Visit   Patient Name: Gloria Ware Patient DOB: [birth year] Patient Age: 71 y.o. Encounter Date: 06/27/2017  Referring Provider: Nicholas Lose, MD  Primary Care Provider: [name], MD  Reason for Visit: Evaluate for hereditary susceptibility to cancer    Assessment and Plan:  . Gloria Ware history is not highly suggestive of a hereditary predisposition to cancer given the age of onset of the breast cancer in her and her sister. She meets NCCN criteria for genetic testing due to breast cancer in the background of her Ashkenazi Jewish ancestry.   . Testing is recommended to determine whether she has a pathogenic mutation that will impact her decision for breast surgery as well as her screening and risk-reduction for future cancers. A negative result will be reassuring.  . Gloria Ware wished to pursue genetic testing and a blood sample will be sent for analysis of the 83 genes on [facility]'s Multi-Cancer panel (ALK, APC, ATM, AXIN2, BAP1, BARD1, BLM, BMPR1A, BRCA1, BRCA2, BRIP1, CASR, CDC73, CDH1, CDK4, CDKN1B, CDKN1C, CDKN2A, CEBPA, CHEK2, CTNNA1, DICER1, DIS3L2, EGFR, EPCAM, FH, FLCN, GATA2, GPC3, GREM1, HOXB13, HRAS, KIT, MAX, MEN1, MET, MITF, MLH1, MSH2, MSH3, MSH6, MUTYH, NBN, NF1, NF2, NTHL1, PALB2, PDGFRA, PHOX2B, PMS2, POLD1, POLE, POT1, PRKAR1A, PTCH1, PTEN, RAD50, RAD51C, RAD51D, RB1, RECQL4, RET, RUNX1, SDHA, SDHAF2, SDHB, SDHC, SDHD, SMAD4, SMARCA4, SMARCB1, SMARCE1, STK11, SUFU, TERC, TERT, TMEM127, TP53, TSC1, TSC2, VHL, WRN, WT1).   . Results should be available in approximately 2-4 weeks, at which point we will contact her and address implications for her as well as address genetic testing for at-risk family members, if needed.     Dr. Lindi Adie was available for questions concerning this case. Total time spent by me in face-to-face counseling was approximately 40 minutes.    _____________________________________________________________________   History of Present Illness: Gloria Ware, a 71 y.o. Ashkenazi Jewish female, is being seen at the [facility] due to a personal and family history of breast cancer. She presents to clinic today to discuss the possibility of a hereditary predisposition to cancer and discuss whether genetic testing is warranted.  Gloria Ware was diagnosed with breast cancer at the age of 71. She has not yet decided on her surgury. She indicated that she would like results of genetic testing first, but that she and her husband are vacationing in [place] and that she will likely have surgery in mid-October.      Malignant neoplasm of upper-inner quadrant of right breast in female, estrogen receptor positive ([place])   06/21/2017 Initial Diagnosis    Screening detected right breast mass and calcifications 1 cm at 1:00 position biopsy of mass and calcifications revealed invasive lobular cancer with pleomorphic features, grade 2, axillary lymph node biopsy positive for cancer, ER/PR positive HER-2 negative with a Ki-67 of 40%, T1b N1 stage IB AJCC 8       Past Medical History:  Diagnosis Date  . Anxiety   . Breast CA ([place])   . Colon polyp 09/12/2005  . Depression   . Family history of breast cancer   . Hearing loss   . High cholesterol   . Osteopenia 11/2016   T score -2.1 stable from prior DEXA  . Tinnitus     Past Surgical History:  Procedure Laterality Date  . CARDIAC CATHETERIZATION    . cataract surg    . DILATION AND CURETTAGE OF UTERUS    .  MOUTH SURGERY      Social History   Social History  . Marital status: Married    Spouse name: N/A  . Number of children: N/A  . Years of education: N/A   Social History Main Topics  . Smoking status: Never Smoker  . Smokeless tobacco: Never Used  . Alcohol use No     Comment: WINE - rarely  . Drug use: No  . Sexual activity: Yes    Partners: Male     Birth control/ protection: Post-menopausal     Comment: 1st intercourse 22 yo-1 partner   Other Topics Concern  . Not on file   Social History Narrative  . No narrative on file     Family History:  During the visit, a 4-generation pedigree was obtained. Family tree will be scanned in the Media tab in Epic  Significant diagnoses include the following:  Family History  Problem Relation Age of Onset  . Breast cancer Sister 71       currently 29  . Hypertension Maternal Grandmother   . Stroke Maternal Grandmother   . Hypertension Maternal Grandfather   . Stroke Maternal Grandfather   . Breast cancer Cousin        unconfirmed in maternal first cousin  . Ovarian cancer Cousin        unconfirmed if ovarian vs. uterine in paternal first cousin    Additionally, Gloria Ware has two sons (ages 24 and 30). She has one sister (age 35) who is cancer-free and one (noted above) who had breast cancer. Her mother (age 67) is cancer-free and has not had a BSO. Her mother has a sister (age 53) and a sister who died at 63. Her father died at 66, cancer-free. He had two sisters; both died in their 64s, cancer-free.  Gloria Ware ancestry is [place]. Both sides of her family are of Ashkenazi Jewish ancestry. There is no consanguinity.  Discussion: We reviewed the characteristics, features and inheritance patterns of hereditary cancer syndromes. We discussed her risk of harboring a mutation in the context of her personal and family history as well as her Ashkenazi Jewish ancestry. We discussed the process of genetic testing, insurance coverage and implications of results: positive, negative and variant of unknown significance (VUS).    Gloria Ware questions were answered to her satisfaction today and she is welcome to call with any additional questions or concerns. Thank you for the referral and allowing [name] to share in the care of your patient.    Steele Berg, MS, [place] Certified Genetic  Counselor phone: [phone] [email or web address]   ______________________________________________________________________ For Office Staff:  Number of people involved in session: 2 Was an Intern/ student involved with case: no

## 2017-06-27 NOTE — Progress Notes (Signed)
[place] [facility] Psychosocial Distress Screening Spiritual Care  Met with Gloria Ware and husband Gloria Ware Grief in [facility] to introduce [place] team/resources, reviewing distress screen per protocol.  The patient scored a 6 on the Psychosocial Distress Thermometer which indicates moderate distress. Also assessed for distress and other psychosocial needs.   ONCBCN DISTRESS SCREENING 06/27/2017  Screening Type Initial Screening  Distress experienced in past week (1-10) 6  Emotional problem type Depression;Nervousness/Anxiety;Adjusting to illness  Information Concerns Type Lack of info about diagnosis;Lack of info about treatment  Referral to support programs Yes   Gloria Ware reports reduced distress via [facility] because her information concerns were resolved and team was so supportive of their upcoming trip to [place], [place] to visit their son and his family.  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   Follow up needed: No.  Couple has full packet of [place] and knows to contact Team with any needs, questions, or interests.  Gloria Ware in particular may follow up with me to practice [name] (a shared skill) and to process caregiver distress.  As always, please page if circumstances change or immediate needs arise.  Thank you.   [place], [place], [facility] Pager [phone] Voicemail [phone]

## 2017-06-27 NOTE — Assessment & Plan Note (Signed)
06/21/2017: Screening detected right breast mass and calcifications 1 cm at 1:00 position biopsy of mass and calcifications revealed invasive lobular cancer with pleomorphic features, grade 2, axillary lymph node biopsy positive for cancer, ER/PR positive HER-2 negative with a Ki-67 of 40%, T1b N1 stage IB AJCC 8  Pathology and radiology counseling:Discussed with the patient, the details of pathology including the type of breast cancer,the clinical staging, the significance of ER, PR and HER-2/neu receptors and the implications for treatment. After reviewing the pathology in detail, we proceeded to discuss the different treatment options between surgery, radiation, chemotherapy, antiestrogen therapies.  Recommendations: 1. Breast conserving surgery followed by 2. Mammaprint testing (if the number of involved lymph nodes is less than 3) to determine if chemotherapy would be of any benefit followed by 3. Adjuvant radiation therapy followed by 4. Adjuvant antiestrogen therapy  Mammaprint counseling: MINDACT is a prospective, randomized phase III controlled trial that investigates the clinical utility of MammaPrint, when compared to standard clinical pathological criteria, with 6,693 patients enrolled from over 111 institutions. Clinical high-risk patients with a Low Risk MammaPrint result, including 48% node-positive, had 5-year distant metastasis-free survival rate in excess of 94 percent, whether randomized to receive adjuvant chemotherapy or not proving MammaPrint's ability to safely identify Low Risk patients.  Return to clinic after surgery to discuss final pathology report and then determine if Mammaprint testing will need to be sent.

## 2017-06-28 ENCOUNTER — Ambulatory Visit (HOSPITAL_COMMUNITY)
Admission: RE | Admit: 2017-06-28 | Discharge: 2017-06-28 | Disposition: A | Discharge status: Home / Self Care | Payer: Medicare Other | Source: Ambulatory Visit | Attending: General Surgery | Admitting: General Surgery

## 2017-06-28 DIAGNOSIS — C50211 Malignant neoplasm of upper-inner quadrant of right female breast: Secondary | ICD-10-CM | POA: Insufficient documentation

## 2017-06-28 DIAGNOSIS — R590 Localized enlarged lymph nodes: Secondary | ICD-10-CM | POA: Diagnosis not present

## 2017-06-28 DIAGNOSIS — Z170 Estrogen receptor positive status [ER+]: Secondary | ICD-10-CM | POA: Insufficient documentation

## 2017-06-28 DIAGNOSIS — C50411 Malignant neoplasm of upper-outer quadrant of right female breast: Secondary | ICD-10-CM | POA: Diagnosis not present

## 2017-06-28 MED ORDER — GADOBENATE DIMEGLUMINE 529 MG/ML IV SOLN
15.0000 mL | Freq: Once | INTRAVENOUS | Status: AC | PRN
  Administered 2017-06-28: 14 mL via INTRAVENOUS

## 2017-07-01 ENCOUNTER — Telehealth: Payer: Self-pay | Admitting: General Surgery

## 2017-07-01 NOTE — Telephone Encounter (Signed)
Reviewed MRI findings.  Discussed additional biopsies with patient.  Will get set up after return from out of town.

## 2017-07-02 ENCOUNTER — Telehealth: Payer: Self-pay | Admitting: *Deleted

## 2017-07-02 ENCOUNTER — Other Ambulatory Visit: Payer: Self-pay | Admitting: General Surgery

## 2017-07-02 DIAGNOSIS — C50211 Malignant neoplasm of upper-inner quadrant of right female breast: Secondary | ICD-10-CM

## 2017-07-02 NOTE — Telephone Encounter (Signed)
Spoke to pt concerning [place] from 9.19.18. Denies questions or concerns regarding dx or treatment care plan. Confirmed MRI bx on 10/12. Encourage pt to call with needs. Received verbal understanding.

## 2017-07-04 ENCOUNTER — Ambulatory Visit: Payer: Self-pay | Admitting: Genetic Counselor

## 2017-07-04 ENCOUNTER — Encounter: Payer: Self-pay | Admitting: Genetic Counselor

## 2017-07-04 DIAGNOSIS — Z1379 Encounter for other screening for genetic and chromosomal anomalies: Secondary | ICD-10-CM

## 2017-07-04 NOTE — Progress Notes (Signed)
[facility]       Genetic Test Results    Patient Name: Gloria Ware Patient DOB: [birth year] Patient Age: 71 y.o. Encounter Date: 07/04/2017  Referring Provider: Nicholas Lose, MD  Primary Care Provider: [name], MD   Gloria Ware was called today to discuss genetic test results. Please see the Genetics note from her visit on 06/27/17 for a detailed discussion of her personal and family history.  Genetic Testing: At the time of Gloria Ware's visit, we recommended she pursue genetic testing of multiple genes associated with hereditary susceptibility to cancer. Testing included sequencing and deletion/duplication analysis. Testing did not reveal any pathogenic mutation in any of these genes.  A copy of the genetic test report will be scanned into Epic under the media tab.  The genes analyzed were the 83 genes on [facility]'s Multi-Cancer panel (ALK, APC, ATM, AXIN2, BAP1, BARD1, BLM, BMPR1A, BRCA1, BRCA2, BRIP1, CASR, CDC73, CDH1, CDK4, CDKN1B, CDKN1C, CDKN2A, CEBPA, CHEK2, CTNNA1, DICER1, DIS3L2, EGFR, EPCAM, FH, FLCN, GATA2, GPC3, GREM1, HOXB13, HRAS, KIT, MAX, MEN1, MET, MITF, MLH1, MSH2, MSH3, MSH6, MUTYH, NBN, NF1, NF2, NTHL1, PALB2, PDGFRA, PHOX2B, PMS2, POLD1, POLE, POT1, PRKAR1A, PTCH1, PTEN, RAD50, RAD51C, RAD51D, RB1, RECQL4, RET, RUNX1, SDHA, SDHAF2, SDHB, SDHC, SDHD, SMAD4, SMARCA4, SMARCB1, SMARCE1, STK11, SUFU, TERC, TERT, TMEM127, TP53, TSC1, TSC2, VHL, WRN, WT1).  Since the current test is not perfect, it is possible that there may be a gene mutation that current testing cannot detect, but that chance is small. It is possible that a different genetic factor, which has not yet been discovered or is not on this panel, is responsible for the cancer diagnoses in the family. Again, the likelihood of this is low. No additional testing is recommended at this time for Gloria Ware.  Cancer Screening: These results suggest that Gloria Ware's cancer was most  likely not due to an inherited predisposition. Most cancers happen by chance and this test, along with details of her family history, suggests that her cancer falls into this category. We discussed continuing to follow the cancer screening guidelines provided by her physician.   Family Members: Family members are at some increased risk of developing cancer, over the general population risk, simply due to the family history. Women are recommended to have a yearly mammogram beginning at age 53, a yearly clinical breast exam, a yearly gynecologic exam and perform monthly breast self-exams. Colon cancer screening is recommended to begin by age 21 in both men and women, unless there is a family history of colon cancer or colon polyps or an individual has a personal history to warrant initiating screening at a younger age.  Any relative who had cancer at a young age or had a particularly rare cancer may also wish to pursue genetic testing. Genetic counselors can be located in other cities, by visiting the website of the [employer] (ArtistMovie.se) and Field seismologist for a Dietitian by zip code.    Lastly, cancer genetics is a rapidly advancing field and it is possible that new genetic tests will be appropriate for Gloria Ware in the future. We encourage her to remain in contact with [name] on an annual basis so we can update her personal and family histories, and let her know of advances in cancer genetics that may benefit the family. Our contact number was provided. Gloria Ware is welcome to call anytime with additional questions.  Steele Berg, MS, [place] Certified Genetic Counselor phone: [phone]

## 2017-07-19 ENCOUNTER — Other Ambulatory Visit: Payer: Medicare Other

## 2017-07-19 ENCOUNTER — Ambulatory Visit
Admission: RE | Admit: 2017-07-19 | Discharge: 2017-07-19 | Disposition: A | Discharge status: Home / Self Care | Payer: Medicare Other | Source: Ambulatory Visit | Attending: General Surgery | Admitting: General Surgery

## 2017-07-19 DIAGNOSIS — C50211 Malignant neoplasm of upper-inner quadrant of right female breast: Secondary | ICD-10-CM

## 2017-07-19 DIAGNOSIS — C50411 Malignant neoplasm of upper-outer quadrant of right female breast: Secondary | ICD-10-CM | POA: Diagnosis not present

## 2017-07-19 DIAGNOSIS — N6489 Other specified disorders of breast: Secondary | ICD-10-CM | POA: Diagnosis not present

## 2017-07-19 DIAGNOSIS — D0501 Lobular carcinoma in situ of right breast: Secondary | ICD-10-CM | POA: Diagnosis not present

## 2017-07-19 DIAGNOSIS — C50011 Malignant neoplasm of nipple and areola, right female breast: Secondary | ICD-10-CM | POA: Diagnosis not present

## 2017-07-19 MED ORDER — GADOBENATE DIMEGLUMINE 529 MG/ML IV SOLN
14.0000 mL | Freq: Once | INTRAVENOUS | Status: AC | PRN
  Administered 2017-07-19: 14 mL via INTRAVENOUS

## 2017-07-22 DIAGNOSIS — C50211 Malignant neoplasm of upper-inner quadrant of right female breast: Secondary | ICD-10-CM | POA: Diagnosis not present

## 2017-07-22 DIAGNOSIS — Z170 Estrogen receptor positive status [ER+]: Secondary | ICD-10-CM | POA: Diagnosis not present

## 2017-07-23 ENCOUNTER — Other Ambulatory Visit: Payer: Self-pay | Admitting: General Surgery

## 2017-07-23 DIAGNOSIS — Z170 Estrogen receptor positive status [ER+]: Principal | ICD-10-CM

## 2017-07-23 DIAGNOSIS — C50811 Malignant neoplasm of overlapping sites of right female breast: Secondary | ICD-10-CM

## 2017-07-24 ENCOUNTER — Other Ambulatory Visit: Payer: Self-pay | Admitting: General Surgery

## 2017-07-24 DIAGNOSIS — C50811 Malignant neoplasm of overlapping sites of right female breast: Secondary | ICD-10-CM

## 2017-07-24 DIAGNOSIS — Z170 Estrogen receptor positive status [ER+]: Principal | ICD-10-CM

## 2017-07-25 ENCOUNTER — Other Ambulatory Visit: Payer: Self-pay | Admitting: Hematology and Oncology

## 2017-07-25 NOTE — Telephone Encounter (Signed)
Pt called requesting refill on her anastrozole. She reports that she tolerated medication well without any much side effects. Pt has had some hot flashes prior to starting medication, but with her surgery coming up, pt thinks its from stress level. Pt would like to also schedule a follow up appt for after surgery with Dr.[name]. Set pt up for appt with MD 2 weeks post surgery. Pt verbalized understanding and confirmed time/date of appt.

## 2017-07-29 ENCOUNTER — Encounter (HOSPITAL_BASED_OUTPATIENT_CLINIC_OR_DEPARTMENT_OTHER): Payer: Self-pay | Admitting: *Deleted

## 2017-08-02 ENCOUNTER — Ambulatory Visit
Admission: RE | Admit: 2017-08-02 | Discharge: 2017-08-02 | Disposition: A | Discharge status: Home / Self Care | Payer: Medicare Other | Source: Ambulatory Visit | Attending: General Surgery | Admitting: General Surgery

## 2017-08-02 ENCOUNTER — Other Ambulatory Visit: Payer: Self-pay | Admitting: General Surgery

## 2017-08-02 DIAGNOSIS — Z170 Estrogen receptor positive status [ER+]: Principal | ICD-10-CM

## 2017-08-02 DIAGNOSIS — C50811 Malignant neoplasm of overlapping sites of right female breast: Secondary | ICD-10-CM

## 2017-08-02 DIAGNOSIS — C773 Secondary and unspecified malignant neoplasm of axilla and upper limb lymph nodes: Secondary | ICD-10-CM | POA: Diagnosis not present

## 2017-08-02 DIAGNOSIS — C50911 Malignant neoplasm of unspecified site of right female breast: Secondary | ICD-10-CM | POA: Diagnosis not present

## 2017-08-02 NOTE — Progress Notes (Signed)
Ensure pre surgery drink given with instructions to complete by [place], hibiclens soap given with instructions, pt verbalized understanding.

## 2017-08-06 ENCOUNTER — Ambulatory Visit (HOSPITAL_BASED_OUTPATIENT_CLINIC_OR_DEPARTMENT_OTHER): Payer: Medicare Other | Admitting: Anesthesiology

## 2017-08-06 ENCOUNTER — Ambulatory Visit (HOSPITAL_BASED_OUTPATIENT_CLINIC_OR_DEPARTMENT_OTHER)
Admission: RE | Admit: 2017-08-06 | Discharge: 2017-08-07 | Disposition: A | Discharge status: Home / Self Care | Payer: Medicare Other | Source: Ambulatory Visit | Attending: General Surgery | Admitting: General Surgery

## 2017-08-06 ENCOUNTER — Ambulatory Visit
Admission: RE | Admit: 2017-08-06 | Discharge: 2017-08-06 | Disposition: A | Discharge status: Home / Self Care | Payer: Medicare Other | Source: Ambulatory Visit | Attending: General Surgery | Admitting: General Surgery

## 2017-08-06 ENCOUNTER — Encounter (HOSPITAL_BASED_OUTPATIENT_CLINIC_OR_DEPARTMENT_OTHER): Payer: Self-pay | Admitting: *Deleted

## 2017-08-06 ENCOUNTER — Encounter (HOSPITAL_BASED_OUTPATIENT_CLINIC_OR_DEPARTMENT_OTHER)
Admission: RE | Disposition: A | Discharge status: Home / Self Care | Payer: Self-pay | Source: Ambulatory Visit | Attending: General Surgery

## 2017-08-06 ENCOUNTER — Ambulatory Visit (HOSPITAL_COMMUNITY): Payer: Medicare Other

## 2017-08-06 ENCOUNTER — Ambulatory Visit (HOSPITAL_COMMUNITY)
Admission: RE | Admit: 2017-08-06 | Discharge: 2017-08-06 | Disposition: A | Discharge status: Home / Self Care | Payer: Medicare Other | Source: Ambulatory Visit | Attending: General Surgery | Admitting: General Surgery

## 2017-08-06 DIAGNOSIS — D0501 Lobular carcinoma in situ of right breast: Secondary | ICD-10-CM | POA: Diagnosis not present

## 2017-08-06 DIAGNOSIS — Z880 Allergy status to penicillin: Secondary | ICD-10-CM | POA: Diagnosis not present

## 2017-08-06 DIAGNOSIS — C50211 Malignant neoplasm of upper-inner quadrant of right female breast: Secondary | ICD-10-CM | POA: Diagnosis not present

## 2017-08-06 DIAGNOSIS — C50811 Malignant neoplasm of overlapping sites of right female breast: Secondary | ICD-10-CM

## 2017-08-06 DIAGNOSIS — G8918 Other acute postprocedural pain: Secondary | ICD-10-CM | POA: Diagnosis not present

## 2017-08-06 DIAGNOSIS — M79672 Pain in left foot: Secondary | ICD-10-CM | POA: Diagnosis not present

## 2017-08-06 DIAGNOSIS — Z79899 Other long term (current) drug therapy: Secondary | ICD-10-CM | POA: Diagnosis not present

## 2017-08-06 DIAGNOSIS — Z170 Estrogen receptor positive status [ER+]: Principal | ICD-10-CM

## 2017-08-06 DIAGNOSIS — C773 Secondary and unspecified malignant neoplasm of axilla and upper limb lymph nodes: Secondary | ICD-10-CM | POA: Diagnosis not present

## 2017-08-06 DIAGNOSIS — R590 Localized enlarged lymph nodes: Secondary | ICD-10-CM | POA: Diagnosis not present

## 2017-08-06 DIAGNOSIS — C50911 Malignant neoplasm of unspecified site of right female breast: Secondary | ICD-10-CM | POA: Diagnosis not present

## 2017-08-06 HISTORY — PX: MASTECTOMY W/ SENTINEL NODE BIOPSY: SHX2001

## 2017-08-06 HISTORY — PX: RADIOACTIVE SEED GUIDED AXILLARY SENTINEL LYMPH NODE: SHX6735

## 2017-08-06 SURGERY — MASTECTOMY WITH SENTINEL LYMPH NODE BIOPSY
Anesthesia: General | Site: Breast | Laterality: Right

## 2017-08-06 MED ORDER — CIPROFLOXACIN IN D5W 400 MG/200ML IV SOLN
INTRAVENOUS | Status: AC
  Filled 2017-08-06: qty 200

## 2017-08-06 MED ORDER — CIPROFLOXACIN IN D5W 400 MG/200ML IV SOLN
400.0000 mg | Freq: Two times a day (BID) | INTRAVENOUS | Status: AC
  Administered 2017-08-06: 400 mg via INTRAVENOUS
  Filled 2017-08-06: qty 200

## 2017-08-06 MED ORDER — SIMETHICONE 80 MG PO CHEW: 40.0000 mg | CHEWABLE_TABLET | Freq: Four times a day (QID) | ORAL | Status: DC | PRN

## 2017-08-06 MED ORDER — MEPERIDINE HCL 25 MG/ML IJ SOLN: 6.2500 mg | INTRAMUSCULAR | Status: DC | PRN

## 2017-08-06 MED ORDER — LORATADINE 10 MG PO TABS: 10.0000 mg | ORAL_TABLET | ORAL | Status: DC | PRN

## 2017-08-06 MED ORDER — ONDANSETRON 4 MG PO TBDP
4.0000 mg | ORAL_TABLET | Freq: Four times a day (QID) | ORAL | Status: DC | PRN
Start: 2017-08-06 — End: 2017-08-07
  Administered 2017-08-06: 4 mg via ORAL
  Filled 2017-08-06: qty 1

## 2017-08-06 MED ORDER — MORPHINE SULFATE (PF) 2 MG/ML IV SOLN: 1.0000 mg | INTRAVENOUS | Status: DC | PRN

## 2017-08-06 MED ORDER — ACETAMINOPHEN 500 MG PO TABS
1000.0000 mg | ORAL_TABLET | Freq: Four times a day (QID) | ORAL | Status: DC
  Administered 2017-08-06: 1000 mg via ORAL
  Filled 2017-08-06: qty 2

## 2017-08-06 MED ORDER — OXYCODONE HCL 5 MG PO TABS: 5.0000 mg | ORAL_TABLET | Freq: Once | ORAL | Status: DC | PRN

## 2017-08-06 MED ORDER — FENTANYL CITRATE (PF) 100 MCG/2ML IJ SOLN
50.0000 ug | INTRAMUSCULAR | Status: AC | PRN
  Administered 2017-08-06: 25 ug via INTRAVENOUS
  Administered 2017-08-06 (×3): 50 ug via INTRAVENOUS
  Administered 2017-08-06: 25 ug via INTRAVENOUS

## 2017-08-06 MED ORDER — OXYCODONE HCL 5 MG/5ML PO SOLN
5.0000 mg | Freq: Once | ORAL | Status: DC | PRN
Start: 2017-08-06 — End: 2017-08-06

## 2017-08-06 MED ORDER — GABAPENTIN 300 MG PO CAPS
300.0000 mg | ORAL_CAPSULE | ORAL | Status: AC
  Administered 2017-08-06: 300 mg via ORAL

## 2017-08-06 MED ORDER — PROMETHAZINE HCL 25 MG/ML IJ SOLN: 6.2500 mg | INTRAMUSCULAR | Status: DC | PRN

## 2017-08-06 MED ORDER — KCL IN DEXTROSE-NACL 20-5-0.45 MEQ/L-%-% IV SOLN
INTRAVENOUS | Status: AC
  Administered 2017-08-06: 16:00:00 via INTRAVENOUS
  Filled 2017-08-06: qty 1000

## 2017-08-06 MED ORDER — CHLORHEXIDINE GLUCONATE CLOTH 2 % EX PADS: 6.0000 | MEDICATED_PAD | Freq: Once | CUTANEOUS | Status: DC

## 2017-08-06 MED ORDER — SERTRALINE HCL 100 MG PO TABS: 200.0000 mg | ORAL_TABLET | Freq: Every day | ORAL | Status: DC

## 2017-08-06 MED ORDER — BUPIVACAINE-EPINEPHRINE (PF) 0.5% -1:200000 IJ SOLN
INTRAMUSCULAR | Status: DC | PRN
  Administered 2017-08-06: 30 mL via PERINEURAL

## 2017-08-06 MED ORDER — LACTATED RINGERS IV SOLN: 500.0000 mL | INTRAVENOUS | Status: DC

## 2017-08-06 MED ORDER — HYDROMORPHONE HCL 1 MG/ML IJ SOLN
INTRAMUSCULAR | Status: AC
  Filled 2017-08-06: qty 0.5

## 2017-08-06 MED ORDER — METHOCARBAMOL 500 MG PO TABS: 500.0000 mg | ORAL_TABLET | Freq: Four times a day (QID) | ORAL | 1 refills | Status: DC | PRN

## 2017-08-06 MED ORDER — OXYCODONE HCL 5 MG PO TABS
5.0000 mg | ORAL_TABLET | Freq: Once | ORAL | Status: DC | PRN
  Filled 2017-08-06: qty 1

## 2017-08-06 MED ORDER — DIPHENHYDRAMINE HCL 12.5 MG/5ML PO ELIX: 12.5000 mg | ORAL_SOLUTION | Freq: Four times a day (QID) | ORAL | Status: DC | PRN

## 2017-08-06 MED ORDER — CIPROFLOXACIN IN D5W 400 MG/200ML IV SOLN
400.0000 mg | INTRAVENOUS | Status: AC
  Administered 2017-08-06: 400 mg via INTRAVENOUS

## 2017-08-06 MED ORDER — ONDANSETRON HCL 4 MG/2ML IJ SOLN: 4.0000 mg | Freq: Four times a day (QID) | INTRAMUSCULAR | Status: DC | PRN

## 2017-08-06 MED ORDER — HYDROMORPHONE HCL 1 MG/ML IJ SOLN: 0.2500 mg | INTRAMUSCULAR | Status: DC | PRN

## 2017-08-06 MED ORDER — TECHNETIUM TC 99M SULFUR COLLOID FILTERED
1.0000 | Freq: Once | INTRAVENOUS | Status: AC | PRN
  Administered 2017-08-06: 1 via INTRADERMAL

## 2017-08-06 MED ORDER — KETOROLAC TROMETHAMINE 30 MG/ML IJ SOLN: 15.0000 mg | Freq: Once | INTRAMUSCULAR | Status: DC | PRN

## 2017-08-06 MED ORDER — OXYCODONE HCL 5 MG PO TABS: 5.0000 mg | ORAL_TABLET | ORAL | 0 refills | Status: DC | PRN

## 2017-08-06 MED ORDER — DEXAMETHASONE SODIUM PHOSPHATE 4 MG/ML IJ SOLN
INTRAMUSCULAR | Status: DC | PRN
Start: 2017-08-06 — End: 2017-08-06
  Administered 2017-08-06: 8 mg via INTRAVENOUS

## 2017-08-06 MED ORDER — HYDROMORPHONE HCL 1 MG/ML IJ SOLN
0.2500 mg | INTRAMUSCULAR | Status: DC | PRN
  Administered 2017-08-06 (×2): 0.25 mg via INTRAVENOUS

## 2017-08-06 MED ORDER — METHOCARBAMOL 500 MG PO TABS
500.0000 mg | ORAL_TABLET | Freq: Four times a day (QID) | ORAL | Status: DC | PRN
  Administered 2017-08-06: 500 mg via ORAL
  Filled 2017-08-06: qty 1

## 2017-08-06 MED ORDER — FENTANYL CITRATE (PF) 100 MCG/2ML IJ SOLN
INTRAMUSCULAR | Status: AC
  Filled 2017-08-06: qty 2

## 2017-08-06 MED ORDER — LIDOCAINE HCL (CARDIAC) 20 MG/ML IV SOLN
INTRAVENOUS | Status: DC | PRN
  Administered 2017-08-06: 100 mg via INTRAVENOUS

## 2017-08-06 MED ORDER — ACETAMINOPHEN 500 MG PO TABS
ORAL_TABLET | ORAL | Status: AC
  Filled 2017-08-06: qty 2

## 2017-08-06 MED ORDER — PHENYLEPHRINE HCL 10 MG/ML IJ SOLN
INTRAVENOUS | Status: DC | PRN
  Administered 2017-08-06: 40 ug/min via INTRAVENOUS

## 2017-08-06 MED ORDER — MIDAZOLAM HCL 2 MG/2ML IJ SOLN
INTRAMUSCULAR | Status: AC
  Filled 2017-08-06: qty 2

## 2017-08-06 MED ORDER — CALCIUM CARBONATE 600 MG PO TABS: 600.0000 mg | ORAL_TABLET | Freq: Two times a day (BID) | ORAL | Status: DC

## 2017-08-06 MED ORDER — LACTATED RINGERS IV SOLN
INTRAVENOUS | Status: DC
  Administered 2017-08-06: 10:00:00 via INTRAVENOUS

## 2017-08-06 MED ORDER — GABAPENTIN 300 MG PO CAPS
ORAL_CAPSULE | ORAL | Status: AC
  Filled 2017-08-06: qty 1

## 2017-08-06 MED ORDER — OXYCODONE HCL 5 MG/5ML PO SOLN: 5.0000 mg | Freq: Once | ORAL | Status: DC | PRN

## 2017-08-06 MED ORDER — ACETAMINOPHEN 500 MG PO TABS
1000.0000 mg | ORAL_TABLET | ORAL | Status: AC
  Administered 2017-08-06: 1000 mg via ORAL

## 2017-08-06 MED ORDER — SCOPOLAMINE 1 MG/3DAYS TD PT72: 1.0000 | MEDICATED_PATCH | Freq: Once | TRANSDERMAL | Status: DC | PRN

## 2017-08-06 MED ORDER — SODIUM CHLORIDE 0.9 % IJ SOLN
INTRAMUSCULAR | Status: AC
  Filled 2017-08-06: qty 10

## 2017-08-06 MED ORDER — SENNA 8.6 MG PO TABS: 1.0000 | ORAL_TABLET | Freq: Two times a day (BID) | ORAL | Status: DC

## 2017-08-06 MED ORDER — GABAPENTIN 300 MG PO CAPS
300.0000 mg | ORAL_CAPSULE | Freq: Two times a day (BID) | ORAL | Status: DC
  Administered 2017-08-06: 300 mg via ORAL

## 2017-08-06 MED ORDER — DIPHENHYDRAMINE HCL 50 MG/ML IJ SOLN: 12.5000 mg | Freq: Four times a day (QID) | INTRAMUSCULAR | Status: DC | PRN

## 2017-08-06 MED ORDER — MIDAZOLAM HCL 2 MG/2ML IJ SOLN
1.0000 mg | INTRAMUSCULAR | Status: DC | PRN
  Administered 2017-08-06: 2 mg via INTRAVENOUS

## 2017-08-06 MED ORDER — TRAMADOL HCL 50 MG PO TABS
50.0000 mg | ORAL_TABLET | Freq: Four times a day (QID) | ORAL | Status: DC | PRN
Start: 2017-08-06 — End: 2017-08-07

## 2017-08-06 MED ORDER — PHENYLEPHRINE HCL 10 MG/ML IJ SOLN
INTRAMUSCULAR | Status: DC | PRN
  Administered 2017-08-06: 80 ug via INTRAVENOUS

## 2017-08-06 MED ORDER — METHYLENE BLUE 0.5 % INJ SOLN
INTRAVENOUS | Status: AC
  Filled 2017-08-06: qty 10

## 2017-08-06 MED ORDER — PROPOFOL 10 MG/ML IV BOLUS
INTRAVENOUS | Status: DC | PRN
  Administered 2017-08-06: 150 mg via INTRAVENOUS

## 2017-08-06 MED ORDER — OXYCODONE HCL 5 MG PO TABS
5.0000 mg | ORAL_TABLET | ORAL | Status: DC | PRN
  Administered 2017-08-06 – 2017-08-07 (×2): 5 mg via ORAL
  Filled 2017-08-06: qty 1

## 2017-08-06 SURGICAL SUPPLY — 67 items
ADH SKN CLS APL DERMABOND .7 (GAUZE/BANDAGES/DRESSINGS) ×2
BINDER BREAST LRG (GAUZE/BANDAGES/DRESSINGS) IMPLANT
BINDER BREAST MEDIUM (GAUZE/BANDAGES/DRESSINGS) IMPLANT
BINDER BREAST XLRG (GAUZE/BANDAGES/DRESSINGS) ×3 IMPLANT
BINDER BREAST XXLRG (GAUZE/BANDAGES/DRESSINGS) IMPLANT
BLADE SURG 10 STRL SS (BLADE) ×3 IMPLANT
BLADE SURG 15 STRL LF DISP TIS (BLADE) ×2 IMPLANT
BLADE SURG 15 STRL SS (BLADE) ×3
BNDG COHESIVE 4X5 TAN STRL (GAUZE/BANDAGES/DRESSINGS) ×3 IMPLANT
CANISTER SUC SOCK COL 7IN (MISCELLANEOUS) IMPLANT
CANISTER SUCT 1200ML W/VALVE (MISCELLANEOUS) ×3 IMPLANT
CHLORAPREP W/TINT 26ML (MISCELLANEOUS) ×3 IMPLANT
CLIP VESOCCLUDE LG 6/CT (CLIP) ×3 IMPLANT
CLIP VESOCCLUDE MED 6/CT (CLIP) ×6 IMPLANT
CLIP VESOCCLUDE SM WIDE 6/CT (CLIP) IMPLANT
COVER MAYO STAND STRL (DRAPES) ×3 IMPLANT
COVER PROBE W GEL 5X96 (DRAPES) ×3 IMPLANT
DECANTER SPIKE VIAL GLASS SM (MISCELLANEOUS) IMPLANT
DERMABOND ADVANCED (GAUZE/BANDAGES/DRESSINGS) ×1
DERMABOND ADVANCED .7 DNX12 (GAUZE/BANDAGES/DRESSINGS) ×2 IMPLANT
DEVICE DUBIN W/COMP PLATE 8390 (MISCELLANEOUS) IMPLANT
DRAIN CHANNEL 19F RND (DRAIN) ×3 IMPLANT
DRAPE UTILITY XL STRL (DRAPES) ×3 IMPLANT
DRSG PAD ABDOMINAL 8X10 ST (GAUZE/BANDAGES/DRESSINGS) ×3 IMPLANT
ELECT COATED BLADE 2.86 ST (ELECTRODE) ×3 IMPLANT
ELECT REM PT RETURN 9FT ADLT (ELECTROSURGICAL) ×3
ELECTRODE REM PT RTRN 9FT ADLT (ELECTROSURGICAL) ×2 IMPLANT
EVACUATOR SILICONE 100CC (DRAIN) ×3 IMPLANT
GAUZE SPONGE 4X4 12PLY STRL LF (GAUZE/BANDAGES/DRESSINGS) ×3 IMPLANT
GLOVE BIO SURGEON STRL SZ 6 (GLOVE) ×6 IMPLANT
GLOVE BIOGEL PI IND STRL 6.5 (GLOVE) ×2 IMPLANT
GLOVE BIOGEL PI IND STRL 7.0 (GLOVE) ×4 IMPLANT
GLOVE BIOGEL PI INDICATOR 6.5 (GLOVE) ×1
GLOVE BIOGEL PI INDICATOR 7.0 (GLOVE) ×2
GOWN STRL REUS W/ TWL LRG LVL3 (GOWN DISPOSABLE) ×4 IMPLANT
GOWN STRL REUS W/TWL 2XL LVL3 (GOWN DISPOSABLE) ×3 IMPLANT
GOWN STRL REUS W/TWL LRG LVL3 (GOWN DISPOSABLE) ×6
KIT MARKER MARGIN INK (KITS) IMPLANT
LIGHT WAVEGUIDE WIDE FLAT (MISCELLANEOUS) ×3 IMPLANT
NDL SAFETY ECLIPSE 18X1.5 (NEEDLE) IMPLANT
NEEDLE HYPO 18GX1.5 SHARP (NEEDLE)
NEEDLE HYPO 25X1 1.5 SAFETY (NEEDLE) IMPLANT
NS IRRIG 1000ML POUR BTL (IV SOLUTION) ×3 IMPLANT
PACK BASIN DAY SURGERY FS (CUSTOM PROCEDURE TRAY) ×3 IMPLANT
PACK UNIVERSAL I (CUSTOM PROCEDURE TRAY) ×3 IMPLANT
PENCIL BUTTON HOLSTER BLD 10FT (ELECTRODE) ×3 IMPLANT
PIN SAFETY STERILE (MISCELLANEOUS) ×3 IMPLANT
SLEEVE SCD COMPRESS KNEE MED (MISCELLANEOUS) ×3 IMPLANT
SPONGE LAP 18X18 X RAY DECT (DISPOSABLE) ×9 IMPLANT
STAPLER VISISTAT 35W (STAPLE) IMPLANT
STOCKINETTE IMPERVIOUS LG (DRAPES) ×3 IMPLANT
STRIP CLOSURE SKIN 1/2X4 (GAUZE/BANDAGES/DRESSINGS) ×3 IMPLANT
SUT ETHILON 2 0 FS 18 (SUTURE) ×6 IMPLANT
SUT MNCRL AB 4-0 PS2 18 (SUTURE) ×6 IMPLANT
SUT MON AB 5-0 PS2 18 (SUTURE) IMPLANT
SUT SILK 2 0 SH (SUTURE) ×3 IMPLANT
SUT VIC AB 2-0 SH 27 (SUTURE) ×2
SUT VIC AB 2-0 SH 27XBRD (SUTURE) ×2 IMPLANT
SUT VIC AB 3-0 SH 27 (SUTURE)
SUT VIC AB 3-0 SH 27X BRD (SUTURE) IMPLANT
SUT VICRYL 3-0 CR8 SH (SUTURE) ×12 IMPLANT
SUT VICRYL 4-0 PS2 18IN ABS (SUTURE) ×6 IMPLANT
SYR CONTROL 10ML LL (SYRINGE) ×3 IMPLANT
TOWEL OR 17X24 6PK STRL BLUE (TOWEL DISPOSABLE) ×3 IMPLANT
TOWEL OR NON WOVEN STRL DISP B (DISPOSABLE) IMPLANT
TUBE CONNECTING 20X1/4 (TUBING) ×3 IMPLANT
YANKAUER SUCT BULB TIP NO VENT (SUCTIONS) ×3 IMPLANT

## 2017-08-06 NOTE — Discharge Instructions (Addendum)
[facility] [place] °[phone] ° °MASTECTOMY: POST OP INSTRUCTIONS ° °Always review your discharge instruction sheet given to you by the facility where your surgery was performed. °IF YOU HAVE DISABILITY OR FAMILY LEAVE FORMS, YOU MUST BRING THEM TO THE OFFICE FOR PROCESSING.   °DO NOT GIVE THEM TO YOUR DOCTOR. °A prescription for pain medication may be given to you upon discharge.  Take your pain medication as prescribed, if needed.  If narcotic pain medicine is not needed, then you may take acetaminophen (Tylenol) or ibuprofen (Advil) as needed. °1. Take your usually prescribed medications unless otherwise directed. °2. If you need a refill on your pain medication, please contact your pharmacy.  They will contact our office to request authorization.  Prescriptions will not be filled after 5pm or on week-ends. °3. You should follow a light diet the first few days after arrival home, such as soup and crackers, etc.  Resume your normal diet the day after surgery. °4. Most patients will experience some swelling and bruising on the chest and underarm.  Ice packs will help.  Swelling and bruising can take several days to resolve.  °5. It is common to experience some constipation if taking pain medication after surgery.  Increasing fluid intake and taking a stool softener (such as Colace) will usually help or prevent this problem from occurring.  A mild laxative (Milk of Magnesia or Miralax) should be taken according to package instructions if there are no bowel movements after 48 hours. °6. Unless discharge instructions indicate otherwise, leave your bandage dry and in place until your next appointment in 3-5 days.  You may take a limited sponge bath.  No tube baths or showers until the drains are removed.  You may have steri-strips (small skin tapes) in place directly over the incision.  These strips should be left on the skin for 7-10 days.  If your surgeon used skin glue on the incision, you may  shower in 24 hours.  The glue will flake off over the next 2-3 weeks.  Any sutures or staples will be removed at the office during your follow-up visit. °7. DRAINS:  If you have drains in place, it is important to keep a list of the amount of drainage produced each day in your drains.  Before leaving the hospital, you should be instructed on drain care.  Call our office if you have any questions about your drains. °8. ACTIVITIES:  You may resume regular (light) daily activities beginning the next day--such as daily self-care, walking, climbing stairs--gradually increasing activities as tolerated.  You may have sexual intercourse when it is comfortable.  Refrain from any heavy lifting or straining until approved by your doctor. °a. You may drive when you are no longer taking prescription pain medication, you can comfortably wear a seatbelt, and you can safely maneuver your car and apply brakes. °b. RETURN TO WORK:  __________________________________________________________ °9. You should see your doctor in the office for a follow-up appointment approximately 3-5 days after your surgery.  Your doctor’s nurse will typically make your follow-up appointment when she calls you with your pathology report.  Expect your pathology report 2-3 business days after your surgery.  You may call to check if you do not hear from us after three days.   °10. OTHER INSTRUCTIONS: ______________________________________________________________________________________________ ____________________________________________________________________________________________ °WHEN TO CALL YOUR DOCTOR: °1. Fever over 101.0 °2. Nausea and/or vomiting °3. Extreme swelling or bruising °4. Continued bleeding from incision. °5. Increased pain, redness, or drainage from the incision. °  The clinic staff is available to answer your questions during regular business hours.  Please dont hesitate to call and ask to speak to one of the nurses for clinical  concerns.  If you have a medical emergency, go to the nearest emergency room or call 911.  A surgeon from [facility] is always on call at the hospital. [address] [place], [place], [place]  [address] ? [address], [place], [place]   [address] [phone] ? [phone] ? FAX [phone] About my Jackson-Pratt Bulb Drain  What is a Jackson-Pratt bulb? A Jackson-Pratt is a soft, round device used to collect drainage. It is connected to a long, thin drainage catheter, which is held in place by one or two small stiches near your surgical incision site. When the bulb is squeezed, it forms a vacuum, forcing the drainage to empty into the bulb.  Emptying the Jackson-Pratt bulb- To empty the bulb: 1. Release the plug on the top of the bulb. 2. Pour the bulb's contents into a measuring container which your nurse will provide. 3. Record the time emptied and amount of drainage. Empty the drain(s) as often as your     doctor or nurse recommends.  Date                  Time                    Amount (Drain 1)                 Amount (Drain 2)  _____________________________________________________________________  _____________________________________________________________________  _____________________________________________________________________  _____________________________________________________________________  _____________________________________________________________________  _____________________________________________________________________  _____________________________________________________________________  _____________________________________________________________________  Squeezing the Jackson-Pratt Bulb- To squeeze the bulb: 1. Make sure the plug at the top of the bulb is open. 2. Squeeze the bulb tightly in your fist. You will hear air squeezing from the bulb. 3. Replace the plug while the bulb is squeezed. 4. Use a safety pin to  attach the bulb to your clothing. This will keep the catheter from     pulling at the bulb insertion site.  When to call your doctor- Call your doctor if:  Drain site becomes red, swollen or hot.  You have a fever greater than 101 degrees F.  There is oozing at the drain site.  Drain falls out (apply a guaze bandage over the drain hole and secure it with tape).  Drainage increases daily not related to activity patterns. (You will usually have more drainage when you are active than when you are resting.)  Drainage has a bad odor.    Post Anesthesia Home Care Instructions  Activity: Get plenty of rest for the remainder of the day. A responsible individual must stay with you for 24 hours following the procedure.  For the next 24 hours, DO NOT: -Drive a car -Paediatric nurse -Drink alcoholic beverages -Take any medication unless instructed by your physician -Make any legal decisions or sign important papers.  Meals: Start with liquid foods such as gelatin or soup. Progress to regular foods as tolerated. Avoid greasy, spicy, heavy foods. If nausea and/or vomiting occur, drink only clear liquids until the nausea and/or vomiting subsides. Call your physician if vomiting continues.  Special Instructions/Symptoms: Your throat may feel dry or sore from the anesthesia or the breathing tube placed in your throat during surgery. If this causes discomfort, gargle with warm salt water. The discomfort should disappear within 24 hours.  If you had a scopolamine patch  placed behind your ear for the management of post- operative nausea and/or vomiting:  1. The medication in the patch is effective for 72 hours, after which it should be removed.  Wrap patch in a tissue and discard in the trash. Wash hands thoroughly with soap and water. 2. You may remove the patch earlier than 72 hours if you experience unpleasant side effects which may include dry mouth, dizziness or visual disturbances. 3.  Avoid touching the patch. Wash your hands with soap and water after contact with the patch.

## 2017-08-06 NOTE — Transfer of Care (Signed)
Immediate Anesthesia Transfer of Care Note  Patient: Gloria Ware  Procedure(s) Performed: RIGHT MASTECTOMY WITH SENTINEL LYMPH NODE BIOPSY (Right Breast) SEED TARGETED AXILLARY LYMPH NODE EXCISION (Right Axilla)  Patient Location: PACU  Anesthesia Type:General  Level of Consciousness: awake, alert  and oriented  Airway & Oxygen Therapy: Patient Spontanous Breathing and Patient connected to face mask oxygen  Post-op Assessment: Report given to RN and Post -op Vital signs reviewed and stable  Post vital signs: Reviewed and stable  Last Vitals:  Vitals:   08/06/17 1045 08/06/17 1050  BP: 113/66 129/61  Pulse: (!) 119 (!) 111  Resp: 15 17  Temp:    SpO2: 100% 100%    Last Pain:  Vitals:   08/06/17 1023  TempSrc:   PainSc: 2       Patients Stated Pain Goal: 3 (25/05/39 7673)  Complications: No apparent anesthesia complications

## 2017-08-06 NOTE — Interval H&P Note (Signed)
History and Physical Interval Note:  08/06/2017 10:36 AM  Gloria Ware  has presented today for surgery, with the diagnosis of RIGHT BREAST CANCER  The various methods of treatment have been discussed with the patient and family. After consideration of risks, benefits and other options for treatment, the patient has consented to  Procedure(s): [place] (Right) as a surgical intervention .  The patient's history has been reviewed, patient examined, no change in status, stable for surgery.  I have reviewed the patient's chart and labs.  Questions were answered to the patient's satisfaction.     [name]

## 2017-08-06 NOTE — Progress Notes (Signed)
Assisted Dr. Germeroth with right, ultrasound guided, pectoralis block. Side rails up, monitors on throughout procedure. See vital signs in flow sheet. Tolerated Procedure well. 

## 2017-08-06 NOTE — Anesthesia Postprocedure Evaluation (Signed)
Anesthesia Post Note  Patient: Gloria Ware  Procedure(s) Performed: RIGHT MASTECTOMY WITH SENTINEL LYMPH NODE BIOPSY (Right Breast) SEED TARGETED AXILLARY LYMPH NODE EXCISION (Right Axilla)     Patient location during evaluation: PACU Anesthesia Type: General Level of consciousness: sedated and patient cooperative Pain management: pain level controlled Vital Signs Assessment: post-procedure vital signs reviewed and stable Respiratory status: spontaneous breathing Cardiovascular status: stable Anesthetic complications: no    Last Vitals:  Vitals:   08/06/17 1500 08/06/17 1530  BP: 124/62 118/71  Pulse: (!) 103 (!) 104  Resp: 15 16  Temp:  (!) 36.4 C  SpO2: 94% 94%    Last Pain:  Vitals:   08/06/17 1530  TempSrc:   PainSc: 0-No pain                 Nolon Nations

## 2017-08-06 NOTE — Anesthesia Preprocedure Evaluation (Addendum)
Anesthesia Evaluation  Patient identified by MRN, date of birth, ID band Patient awake    Reviewed: Allergy & Precautions, NPO status , Patient's Chart, lab work & pertinent test results  Airway Mallampati: II  TM Distance: >3 FB Neck ROM: Full    Dental no notable dental hx.    Pulmonary neg pulmonary ROS,    Pulmonary exam normal breath sounds clear to auscultation       Cardiovascular negative cardio ROS Normal cardiovascular exam Rhythm:Regular Rate:Normal     Neuro/Psych negative neurological ROS  negative psych ROS   GI/Hepatic negative GI ROS, Neg liver ROS,   Endo/Other  negative endocrine ROS  Renal/GU negative Renal ROS     Musculoskeletal negative musculoskeletal ROS (+)   Abdominal   Peds  Hematology negative hematology ROS (+)   Anesthesia Other Findings   Reproductive/Obstetrics negative OB ROS                            Anesthesia Physical Anesthesia Plan  ASA: II  Anesthesia Plan: General   Post-op Pain Management: GA combined w/ Regional for post-op pain   Induction: Intravenous  PONV Risk Score and Plan: 4 or greater and Ondansetron, Dexamethasone, Propofol infusion and Metaclopromide  Airway Management Planned: LMA  Additional Equipment:   Intra-op Plan:   Post-operative Plan: Extubation in OR  Informed Consent: I have reviewed the patients History and Physical, chart, labs and discussed the procedure including the risks, benefits and alternatives for the proposed anesthesia with the patient or authorized representative who has indicated his/her understanding and acceptance.   Dental advisory given  Plan Discussed with: CRNA  Anesthesia Plan Comments:         Anesthesia Quick Evaluation

## 2017-08-06 NOTE — Op Note (Signed)
Right Mastectomy with seed targeted lymph node biopsy and sentinel node biopsy  Indications: This patient presents with history of right breast cancer with positive axillary LN.  Pre-operative Diagnosis: right breast cancer, [id number]  Post-operative Diagnosis: right breast cancer, same  Surgeon: [name]   Anesthesia: General endotracheal anesthesia with pectoral block.    ASA Class: 2  Procedure Details  The patient was seen in the Holding Room. The risks, benefits, complications, treatment options, and expected outcomes were discussed with the patient. The possibilities of reaction to medication, pulmonary aspiration, bleeding, infection, the need for additional procedures, failure to diagnose a condition, and creating a complication requiring transfusion or operation were discussed with the patient. The patient concurred with the proposed plan, giving informed consent.  The site of surgery properly noted/marked. The patient was taken to Operating Room # 5, identified as Gloria Ware and the procedure verified as Right Mastectomy and seed targeted lymph node biopsy with sentinel Node Biopsy. A Time Out was held and the above information confirmed.    After induction of anesthesia, the right arm, breast, and chest were prepped and draped in standard fashion.   An anchor incision was drawn out on the breast.  The superior incision was made with the #10 blade. Mastectomy hooks were used to provide elevation of the skin edges, and the cautery was used to create the mastectomy flaps.  The dissection was taken to the fascia of the pectoralis major.  The penetrating vessels were clipped.  The flaps were taken medially to the lateral sternal border, superiorly to the inferior border of the clavicle, laterally to the latissimus.  The breast was taken off including the pectoralis fascia and the axillary tail marked.    Using a hand-held gamma probe, the seed localized node was identified first.   Clips were used to ligate the lymphovascular channels.  Deep axillary sentinel nodes were identified.  Five deep level 2 axillary sentinel nodes were removed and submitted to pathology.  The findings are below.  The lymphovascular channels were clipped with metal clips.        The wound was irrigated. One 19 Blake drains were placed laterally and secured with a 2-0 nylon.   Hemostasis was achieved with cautery.  The anchor incision was irrigated and closed with a 3-0 Vicryl deep dermal interrupted sutures and 4-0 Vicryl subcuticular closure in layers.    Sterile dressings were applied. At the end of the operation, all sponge, instrument, and needle counts were correct.  Findings: grossly clear surgical margins, seed in first sentinel node.  Five additional hot sentinel nodes with highest cps 590.  Estimated Blood Loss:  less than 50 mL         Drains: 19 Fr blake                Specimens: R breast, right LN with seed and 5 additional axillary sentinel nodes         Complications:  None; patient tolerated the procedure well.         Disposition: PACU - hemodynamically stable.         Condition: stable

## 2017-08-06 NOTE — Anesthesia Procedure Notes (Signed)
Anesthesia Regional Block: Pectoralis block   Pre-Anesthetic Checklist: ,, timeout performed, Correct Patient, Correct Site, Correct Laterality, Correct Procedure, Correct Position, site marked, Risks and benefits discussed,  Surgical consent,  Pre-op evaluation,  At surgeon's request and post-op pain management  Laterality: Right  Prep: chloraprep       Needles:   Needle Type: Stimiplex     Needle Length: 9cm      Additional Needles:   Narrative:  Start time: 08/06/2017 10:29 AM End time: 08/06/2017 10:34 AM Injection made incrementally with aspirations every 5 mL.  Performed by: Personally  Anesthesiologist: Nolon Nations  Additional Notes: Patient tolerated well. Good fascial spread noted.

## 2017-08-06 NOTE — H&P (Signed)
Gloria Ware 07/22/2017 2:01 PM Location: [facility] Patient #: [id number] DOB: [birth year] Married / Language: English / Race: White Female   History of Present Illness Gloria Klein MD; 07/22/2017 3:08 PM) The patient is a 71 year old female who presents for a follow-up for Breast cancer. Pt is a 71 yo F who presents with a new diagnosis of right breast cancer 06/2017. She had screening detected mass as well as calcifications. Diagnostic imaging showed calcifications in the upper inner quadrant and a 1 cm mass that was at 1 o'clock. Both were biopsied and were invasive mammary carcinoma (lobular phenotype), grade 2, ER/PR +, Her 2 -, Ki67 40%. A second look ultrasound was performed and a biopsy was performed of a right axillary lymph node that was also positive and similar to the prior bx. She has a sister dx with breast cancer at age 96. She had menarche at age 67 and menopause in the early 67s. She is a G2P2 with first child in late 40s. 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 Her son his family live in [place] and they are planning a trip leaving next week.    She had a breast MRI and had several other abnormal areas on the right. 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 The total area of abnormality was 9 cm and a portion of the abnormal area was in all quadrants. She is here to discuss mastectomy. She is sore from the other biopsies.    Allergies (April Staton, CMA; 07/22/2017 2:01 PM) Penicillins   Medication History (April Staton, CMA; 07/22/2017 2:02 PM) Anastrozole (1MG Tablet, Oral) Active. Advil (200MG Tablet, Oral) Active. Aspirin (81MG Tablet DR, Oral) Active. Caltrate 600 + D (600-125MG-IU Tablet, Oral) Active. Claritin (10MG Tablet, Oral) Active. Sertraline HCl (100MG Tablet, Oral) Active. Tylenol Arthritis Ext Relief (650MG Tablet ER, Oral) Active. Lipitor (20MG Tablet, Oral)  Active. Medications Reconciled    Review of Systems Gloria Klein MD; 07/22/2017 3:08 PM) All other systems negative  Vitals (April Staton [place]; 07/22/2017 2:03 PM) 07/22/2017 2:02 PM Weight: 149.5 lb Height: 61in Body Surface Area: 1.67 m Body Mass Index: 28.25 kg/m  Temp.: 98.80F(Oral)  Pulse: 97 (Regular)  BP: 100/64 (Sitting, Left Arm, Standard)       Physical Exam Gloria Klein MD; 07/22/2017 3:08 PM) General Mental Status-Alert. General Appearance-Consistent with stated age. Hydration-Well hydrated. Voice-Normal.  Head and Neck Head-normocephalic, atraumatic with no lesions or palpable masses.  Eye Sclera/Conjunctiva - Bilateral-No scleral icterus.  Chest and Lung Exam Chest and lung exam reveals -quiet, even and easy respiratory effort with no use of accessory muscles. Inspection Chest Wall - Normal. Back - normal.  Breast Note: bruising on right breast. no masses or LN palpable. No change in left breast exam.   Cardiovascular Cardiovascular examination reveals -normal pedal pulses bilaterally. Note: regular rate and rhythm  Peripheral Vascular Upper Extremity Inspection - Bilateral - Normal - No Clubbing, No Cyanosis, No Edema, Pulses Intact. Lower Extremity Palpation - Edema - Bilateral - No edema.  Neurologic Neurologic evaluation reveals -alert and oriented x 3 with no impairment of recent or remote memory. Mental Status-Normal.  Musculoskeletal Global Assessment -Note: no gross deformities.  Normal Exam - Left-Upper Extremity Strength Normal and Lower Extremity Strength Normal. Normal Exam - Right-Upper Extremity Strength Normal and Lower Extremity Strength Normal.  Lymphatic Head & Neck  General Head & Neck Lymphatics: Bilateral - Description - Normal. Axillary  General  Axillary Region: Bilateral - Description - Normal. Tenderness - Non Tender.    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 She will need post op XRT due to positive lymph node.  Will refer to Dr. Iran Planas who can see today.  Discussion over 30 min to discuss possibilities of reconstruction. I basically advised that likely she would be able to have expander recon at the time of mastectomy or delayed flap reconstruction. I reviewed risks of bleeding, infection, and wound problems.  Once she has seen Dr. Iran Planas, we will get this set up. Current Plans Referred to Surgery - Plastic, for evaluation and follow up (Plastic Surgery). Routine. Pt Education - [facility] Mastectomy HCI   Signed by Gloria Klein, MD (07/22/2017 3:11 PM)

## 2017-08-06 NOTE — Anesthesia Procedure Notes (Signed)
Procedure Name: LMA Insertion Date/Time: 08/06/2017 11:12 AM Performed by: Melynda Ripple D Pre-anesthesia Checklist: Patient identified, Emergency Drugs available, Suction available and Patient being monitored Patient Re-evaluated:Patient Re-evaluated prior to induction Oxygen Delivery Method: Circle system utilized Preoxygenation: Pre-oxygenation with 100% oxygen Induction Type: IV induction Ventilation: Mask ventilation without difficulty LMA: LMA inserted LMA Size: 4.0 Number of attempts: 1 Airway Equipment and Method: Bite block Placement Confirmation: positive ETCO2 Tube secured with: Tape Dental Injury: Teeth and Oropharynx as per pre-operative assessment

## 2017-08-07 ENCOUNTER — Encounter (HOSPITAL_BASED_OUTPATIENT_CLINIC_OR_DEPARTMENT_OTHER): Payer: Self-pay | Admitting: General Surgery

## 2017-08-07 DIAGNOSIS — Z880 Allergy status to penicillin: Secondary | ICD-10-CM | POA: Diagnosis not present

## 2017-08-07 DIAGNOSIS — G8918 Other acute postprocedural pain: Secondary | ICD-10-CM | POA: Diagnosis not present

## 2017-08-07 DIAGNOSIS — C773 Secondary and unspecified malignant neoplasm of axilla and upper limb lymph nodes: Secondary | ICD-10-CM | POA: Diagnosis not present

## 2017-08-07 DIAGNOSIS — Z79899 Other long term (current) drug therapy: Secondary | ICD-10-CM | POA: Diagnosis not present

## 2017-08-07 DIAGNOSIS — D0501 Lobular carcinoma in situ of right breast: Secondary | ICD-10-CM | POA: Diagnosis not present

## 2017-08-07 NOTE — Addendum Note (Signed)
Addendum  created 08/07/17 0932 by Tawni Millers, CRNA   Charge Capture section accepted

## 2017-08-09 DIAGNOSIS — S92302A Fracture of unspecified metatarsal bone(s), left foot, initial encounter for closed fracture: Secondary | ICD-10-CM | POA: Diagnosis not present

## 2017-08-20 ENCOUNTER — Other Ambulatory Visit: Payer: Self-pay

## 2017-08-20 MED ORDER — ANASTROZOLE 1 MG PO TABS: 1.0000 mg | ORAL_TABLET | Freq: Every day | ORAL | 3 refills | Status: DC

## 2017-08-21 ENCOUNTER — Telehealth: Payer: Self-pay | Admitting: *Deleted

## 2017-08-21 NOTE — Telephone Encounter (Signed)
Ordered mammaprint per Dr. [name].  Faxed requisition to pathology and [facility] 

## 2017-08-23 ENCOUNTER — Ambulatory Visit (HOSPITAL_BASED_OUTPATIENT_CLINIC_OR_DEPARTMENT_OTHER): Payer: Medicare Other | Admitting: Hematology and Oncology

## 2017-08-23 ENCOUNTER — Encounter: Payer: Self-pay | Admitting: *Deleted

## 2017-08-23 DIAGNOSIS — C50211 Malignant neoplasm of upper-inner quadrant of right female breast: Secondary | ICD-10-CM | POA: Diagnosis not present

## 2017-08-23 DIAGNOSIS — Z170 Estrogen receptor positive status [ER+]: Secondary | ICD-10-CM

## 2017-08-23 DIAGNOSIS — R52 Pain, unspecified: Secondary | ICD-10-CM

## 2017-08-23 DIAGNOSIS — R251 Tremor, unspecified: Secondary | ICD-10-CM

## 2017-08-23 NOTE — Assessment & Plan Note (Signed)
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 I discussed the margins as well as lymph node surgeries. We also discussed the final staging along with previously performed ER/PR and HER-2/neu testing.  Recommendation: 1.  Mammaprint to decide if she needs additional systemic chemotherapy 2. adjuvant radiation therapy 3.  Followed by adjuvant antiestrogen therapy 

## 2017-08-23 NOTE — Progress Notes (Signed)
Patient Care Team: [name], MD as PCP - General (Family Medicine) [name], MD as Consulting Physician (General Surgery) Nicholas Lose, MD as Consulting Physician (Hematology and Oncology) Kyung Rudd, MD as Consulting Physician (Radiation Oncology)  DIAGNOSIS:  Encounter Diagnosis  Name Primary?  . Malignant neoplasm of upper-inner quadrant of right breast in female, estrogen receptor positive ([place])     SUMMARY OF ONCOLOGIC HISTORY:   Malignant neoplasm of upper-inner quadrant of right breast in female, estrogen receptor positive ([place])   06/21/2017 Initial Diagnosis    Screening detected right breast mass and calcifications 1 cm at 1:00 position biopsy of mass and calcifications revealed invasive lobular cancer with pleomorphic features, grade 2, axillary lymph node biopsy positive for cancer, ER/PR positive HER-2 negative with a Ki-67 of 40%, T1b N1 stage IB AJCC 8      08/06/2017 Surgery    Right mastectomy: Scattered foci of invasive lobular cancer, grade 2, 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  She is complaining of moderate amount of pain and discomfort.  She appears to be very anxious has tremors today.  REVIEW OF SYSTEMS:   Constitutional: Denies fevers, chills or abnormal weight loss Eyes: Denies blurriness of vision Ears, nose, mouth, throat, and face: Denies mucositis or sore throat Respiratory: Denies cough, dyspnea or wheezes Cardiovascular: Denies palpitation, chest discomfort Gastrointestinal:  Denies nausea, heartburn or change in bowel habits Skin: Denies abnormal skin rashes Lymphatics: Denies  new lymphadenopathy or easy bruising Neurological:Denies numbness, tingling or new weaknesses Behavioral/Psych: Mood is stable, no new changes  Extremities: No lower extremity edema  All other systems were reviewed with the patient and are negative.  I have reviewed the past medical history, past surgical history, social history and family history with the patient and they are unchanged from previous note.  ALLERGIES:  is allergic to penicillins.  MEDICATIONS:  Current Outpatient Medications  Medication Sig Dispense Refill  . acetaminophen (TYLENOL) 500 MG tablet Take 500 mg by mouth every 6 (six) hours as needed.    Marland Kitchen anastrozole (ARIMIDEX) 1 MG tablet Take 1 tablet (1 mg total) daily by mouth. 90 tablet 3  . aspirin 81 MG tablet Take 81 mg by mouth daily.      . calcium carbonate (OS-CAL) 600 MG TABS Take 600 mg by mouth 2 (two) times daily with a meal.      . loratadine (CLARITIN) 10 MG tablet Take 10 mg by mouth as needed for allergies.    . methocarbamol (ROBAXIN) 500 MG tablet Take 1 tablet (500 mg total) by mouth every 6 (six) hours as needed for muscle spasms. 20 tablet 1  . oxyCODONE (OXY IR/ROXICODONE) 5 MG immediate release tablet Take 1-2 tablets (5-10 mg total) by mouth every 4 (four) hours as needed for moderate pain. 20 tablet 0  . sertraline (ZOLOFT) 100 MG tablet Take 200 mg by mouth. 2 tabs qd     No current facility-administered medications for this visit.     PHYSICAL EXAMINATION: ECOG PERFORMANCE STATUS: 1 - Symptomatic but completely ambulatory  Vitals:   08/23/17 1044  BP: (!) 129/97  Pulse: 62  Resp: 20  Temp: 98.2 F (36.8 C)  SpO2: 98%   Filed Weights   08/23/17 1044  Weight: 146 lb (66.2 kg)    GENERAL:alert, no distress and comfortable SKIN: skin color, texture, turgor are normal, no rashes or significant lesions EYES: normal, Conjunctiva are pink and non-injected, sclera clear OROPHARYNX:no exudate, no erythema and lips, buccal mucosa, and  tongue normal  NECK: supple, thyroid normal size, non-tender, without nodularity LYMPH:  no palpable lymphadenopathy in the cervical, axillary or inguinal LUNGS: clear to auscultation and percussion with normal breathing effort HEART: regular rate & rhythm and no murmurs and no lower extremity edema ABDOMEN:abdomen soft, non-tender and normal bowel sounds MUSCULOSKELETAL:no cyanosis of digits and no clubbing  NEURO: alert & oriented x 3 with fluent speech, no focal motor/sensory deficits EXTREMITIES: No lower extremity edema   LABORATORY DATA:  I have reviewed the data as listed   Chemistry      Component Value Date/Time   NA 137 06/26/2017 1222   K 3.9 06/26/2017 1222   CO2 24 06/26/2017 1222   BUN 15.9 06/26/2017 1222   CREATININE 0.9 06/26/2017 1222      Component Value Date/Time   CALCIUM 9.7 06/26/2017 1222   ALKPHOS 89 06/26/2017 1222   AST 17 06/26/2017 1222   ALT 16 06/26/2017 1222   BILITOT 0.39 06/26/2017 1222       Lab Results  Component Value Date   WBC 5.8 06/26/2017   HGB 13.1 06/26/2017   HCT 39.7 06/26/2017   MCV 88.2 06/26/2017   PLT 257 06/26/2017   NEUTROABS 3.2 06/26/2017    ASSESSMENT & PLAN:  Malignant neoplasm of upper-inner quadrant of right breast in female, estrogen receptor positive ([place]) 08/06/2017 right mastectomy: Scattered foci of invasive lobular cancer, grade 2, 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 I discussed the margins as well as lymph node surgeries. We also discussed the final staging along with previously performed ER/PR and HER-2/neu testing.  Recommendation: 1.  Mammaprint to decide if she needs additional systemic chemotherapy 2. adjuvant radiation therapy 3.  Followed by adjuvant antiestrogen  therapy   I spent 25 minutes talking to the patient of which more than half was spent in counseling and coordination of care.  No orders of the defined types were placed in this encounter.  The patient has a good understanding of the overall plan. she agrees with it. she will call with any problems that may develop before the next visit here.   Rulon Eisenmenger, MD 08/23/17

## 2017-08-26 ENCOUNTER — Other Ambulatory Visit (HOSPITAL_COMMUNITY): Payer: Self-pay | Admitting: General Surgery

## 2017-08-26 DIAGNOSIS — C50211 Malignant neoplasm of upper-inner quadrant of right female breast: Secondary | ICD-10-CM

## 2017-08-27 ENCOUNTER — Encounter: Payer: Self-pay | Admitting: General Practice

## 2017-08-27 NOTE — Progress Notes (Signed)
[place] Spiritual Care Note  Referred by navigator [facility] for f/u support per pt's request for counseling.  Spoke in detail with Gloria Ware, who describes herself as feeling much better than she was when she spoke with [place] last week.  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  Provided emotional support, normalization of feelings, and detailed information about layers of support available at [facility].  [name] plans to phone me after she gets her scan results, when treatment plan is clear, for further support.  From there we may schedule a Spiritual Care appt and/or explore community counseling referrals.   [place], [place], [facility] Pager [phone] Voicemail [phone]

## 2017-08-28 ENCOUNTER — Telehealth: Payer: Self-pay | Admitting: *Deleted

## 2017-08-28 ENCOUNTER — Encounter (HOSPITAL_COMMUNITY): Payer: Self-pay

## 2017-08-28 ENCOUNTER — Other Ambulatory Visit: Payer: Self-pay

## 2017-08-28 MED ORDER — ANASTROZOLE 1 MG PO TABS: 1.0000 mg | ORAL_TABLET | Freq: Every day | ORAL | 3 refills | Status: DC

## 2017-08-28 NOTE — Telephone Encounter (Signed)
Received mammaprint results of high risk.  Confirmed appt with Dr. Lindi Adie for 09/02/17 at 1115am.

## 2017-09-02 ENCOUNTER — Ambulatory Visit: Payer: Medicare Other | Attending: General Surgery | Admitting: Physical Therapy

## 2017-09-02 ENCOUNTER — Telehealth: Payer: Self-pay | Admitting: Hematology and Oncology

## 2017-09-02 ENCOUNTER — Telehealth: Payer: Self-pay

## 2017-09-02 ENCOUNTER — Ambulatory Visit (HOSPITAL_BASED_OUTPATIENT_CLINIC_OR_DEPARTMENT_OTHER): Payer: Medicare Other | Admitting: Hematology and Oncology

## 2017-09-02 ENCOUNTER — Other Ambulatory Visit: Payer: Self-pay

## 2017-09-02 ENCOUNTER — Encounter: Payer: Self-pay | Admitting: Physical Therapy

## 2017-09-02 VITALS — BP 124/81 | HR 93 | Temp 97.6°F | Resp 18 | Ht 61.0 in | Wt 146.0 lb

## 2017-09-02 DIAGNOSIS — C50211 Malignant neoplasm of upper-inner quadrant of right female breast: Secondary | ICD-10-CM | POA: Diagnosis not present

## 2017-09-02 DIAGNOSIS — M25611 Stiffness of right shoulder, not elsewhere classified: Secondary | ICD-10-CM | POA: Diagnosis not present

## 2017-09-02 DIAGNOSIS — Z170 Estrogen receptor positive status [ER+]: Secondary | ICD-10-CM

## 2017-09-02 DIAGNOSIS — Z5181 Encounter for therapeutic drug level monitoring: Secondary | ICD-10-CM

## 2017-09-02 DIAGNOSIS — C773 Secondary and unspecified malignant neoplasm of axilla and upper limb lymph nodes: Secondary | ICD-10-CM

## 2017-09-02 DIAGNOSIS — Z79899 Other long term (current) drug therapy: Principal | ICD-10-CM

## 2017-09-02 DIAGNOSIS — R293 Abnormal posture: Secondary | ICD-10-CM | POA: Diagnosis not present

## 2017-09-02 DIAGNOSIS — R262 Difficulty in walking, not elsewhere classified: Secondary | ICD-10-CM | POA: Insufficient documentation

## 2017-09-02 DIAGNOSIS — M6281 Muscle weakness (generalized): Secondary | ICD-10-CM | POA: Diagnosis not present

## 2017-09-02 MED ORDER — DEXAMETHASONE 4 MG PO TABS: 4.0000 mg | ORAL_TABLET | Freq: Every day | ORAL | 0 refills | Status: AC

## 2017-09-02 MED ORDER — LIDOCAINE-PRILOCAINE 2.5-2.5 % EX CREA: TOPICAL_CREAM | CUTANEOUS | 3 refills | Status: DC

## 2017-09-02 MED ORDER — ONDANSETRON HCL 8 MG PO TABS: 8.0000 mg | ORAL_TABLET | Freq: Two times a day (BID) | ORAL | 1 refills | Status: DC | PRN

## 2017-09-02 MED ORDER — LORAZEPAM 0.5 MG PO TABS: 0.5000 mg | ORAL_TABLET | Freq: Every evening | ORAL | 0 refills | Status: DC | PRN

## 2017-09-02 MED ORDER — PROCHLORPERAZINE MALEATE 10 MG PO TABS: 10.0000 mg | ORAL_TABLET | Freq: Four times a day (QID) | ORAL | 1 refills | Status: DC | PRN

## 2017-09-02 NOTE — Addendum Note (Signed)
Addended by: Nicholas Lose on: 09/02/2017 05:05 PM   Modules accepted: Orders

## 2017-09-02 NOTE — Progress Notes (Signed)
Patient Care Team: [name], MD as PCP - General (Family Medicine) [name], MD as Consulting Physician (General Surgery) Nicholas Lose, MD as Consulting Physician (Hematology and Oncology) Kyung Rudd, MD as Consulting Physician (Radiation Oncology)  DIAGNOSIS:  Encounter Diagnosis  Name Primary?  . Malignant neoplasm of upper-inner quadrant of right breast in female, estrogen receptor positive ([place])     SUMMARY OF ONCOLOGIC HISTORY:   Malignant neoplasm of upper-inner quadrant of right breast in female, estrogen receptor positive ([place])   06/21/2017 Initial Diagnosis    Screening detected right breast mass and calcifications 1 cm at 1:00 position biopsy of mass and calcifications revealed invasive lobular cancer with pleomorphic features, grade 2, axillary lymph node biopsy positive for cancer, ER/PR positive HER-2 negative with a Ki-67 of 40%, T1b N1 stage IB AJCC 8      08/06/2017 Surgery    Right mastectomy: Scattered foci of invasive lobular cancer, grade 2, 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  She is here today accompanied by her husband to discuss the results of the test.  REVIEW OF SYSTEMS:   Constitutional: Denies fevers, chills or abnormal weight loss Eyes: Denies blurriness of vision Ears, nose, mouth, throat, and face: Denies mucositis or sore throat Respiratory: Denies cough, dyspnea or wheezes Cardiovascular: Denies palpitation, chest discomfort Gastrointestinal:  Denies  nausea, heartburn or change in bowel habits Skin: Denies abnormal skin rashes Lymphatics: Denies new lymphadenopathy or easy bruising Neurological:Denies numbness, tingling or new weaknesses Behavioral/Psych: Mood is stable, no new changes  Extremities: No lower extremity edema Breast:  denies any pain or lumps or nodules in either breasts All other systems were reviewed with the patient and are negative.  I have reviewed the past medical history, past surgical history, social history and family history with the patient and they are unchanged from previous note.  ALLERGIES:  is allergic to penicillins.  MEDICATIONS:  Current Outpatient Medications  Medication Sig Dispense Refill  . acetaminophen (TYLENOL) 500 MG tablet Take 500 mg by mouth every 6 (six) hours as needed.    Marland Kitchen anastrozole (ARIMIDEX) 1 MG tablet Take 1 tablet (1 mg total) by mouth daily. 90 tablet 3  . aspirin 81 MG tablet Take 81 mg by mouth daily.      . calcium carbonate (OS-CAL) 600 MG TABS Take 600 mg by mouth 2 (two) times daily with a meal.      . loratadine (CLARITIN) 10 MG tablet Take 10 mg by mouth as needed for allergies.    . methocarbamol (ROBAXIN) 500 MG tablet Take 1 tablet (500 mg total) by mouth every 6 (six) hours as needed for muscle spasms. 20 tablet 1  . oxyCODONE (OXY IR/ROXICODONE) 5 MG immediate release tablet Take 1-2 tablets (5-10 mg total) by mouth every 4 (four) hours as needed for moderate pain. 20 tablet 0  . sertraline (ZOLOFT) 100 MG tablet Take 200 mg by mouth. 2 tabs qd     No current facility-administered medications for this  visit.     PHYSICAL EXAMINATION: ECOG PERFORMANCE STATUS: 1 - Symptomatic but completely ambulatory  Vitals:   09/02/17 1101  BP: 124/81  Pulse: 93  Resp: 18  Temp: 97.6 F (36.4 C)  SpO2: 98%   Filed Weights   09/02/17 1101  Weight: 146 lb (66.2 kg)    GENERAL:alert, no distress and comfortable SKIN: skin color, texture, turgor are normal, no rashes  or significant lesions EYES: normal, Conjunctiva are pink and non-injected, sclera clear OROPHARYNX:no exudate, no erythema and lips, buccal mucosa, and tongue normal  NECK: supple, thyroid normal size, non-tender, without nodularity LYMPH:  no palpable lymphadenopathy in the cervical, axillary or inguinal LUNGS: clear to auscultation and percussion with normal breathing effort HEART: regular rate & rhythm and no murmurs and no lower extremity edema ABDOMEN:abdomen soft, non-tender and normal bowel sounds MUSCULOSKELETAL:no cyanosis of digits and no clubbing  NEURO: alert & oriented x 3 with fluent speech, no focal motor/sensory deficits EXTREMITIES: No lower extremity edema  LABORATORY DATA:  I have reviewed the data as listed   Chemistry      Component Value Date/Time   NA 137 06/26/2017 1222   K 3.9 06/26/2017 1222   CO2 24 06/26/2017 1222   BUN 15.9 06/26/2017 1222   CREATININE 0.9 06/26/2017 1222      Component Value Date/Time   CALCIUM 9.7 06/26/2017 1222   ALKPHOS 89 06/26/2017 1222   AST 17 06/26/2017 1222   ALT 16 06/26/2017 1222   BILITOT 0.39 06/26/2017 1222       Lab Results  Component Value Date   WBC 5.8 06/26/2017   HGB 13.1 06/26/2017   HCT 39.7 06/26/2017   MCV 88.2 06/26/2017   PLT 257 06/26/2017   NEUTROABS 3.2 06/26/2017    ASSESSMENT & PLAN:  Malignant neoplasm of upper-inner quadrant of right breast in female, estrogen receptor positive ([place]) 08/06/2017 right mastectomy: Scattered foci of invasive lobular cancer, grade 2, 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 I discussed the margins as well as lymph node surgeries. We also discussed the final staging along with previously performed ER/PR and HER-2/neu  testing.  Treatment plan: 1.  Mammaprint high risk: Dose since Adriamycin and Cytoxan x4 followed by Taxol weekly x12  2. adjuvant radiation therapy 3.  Followed by adjuvant antiestrogen therapy --------------------------------------------------------------------- Chemotherapy Counseling: I discussed the risks and benefits of chemotherapy including the risks of nausea/ vomiting, risk of infection from low WBC count, fatigue due to chemo or anemia, bruising or bleeding due to low platelets, mouth sores, loss/ change in taste and decreased appetite. Liver and kidney function will be monitored through out chemotherapy as abnormalities in liver and kidney function may be a side effect of treatment. Cardiac dysfunction due to Adriamycin Herceptin and Perjeta were discussed in detail. Risk of permanent bone marrow dysfunction and leukemia due to chemo were also discussed.  UPBEAT clinical trial ([id number]): Newly diagnosed stage I to III breast cancer patients receiving either adjuvant or neoadjuvant chemotherapy undergo cardiac MRI before treatment and at 24 months along with neurocognitive testing, exercise and disability measures at baseline 3, 12 and 24 months.  Return to clinic in 2 weeks to start chemotherapy.  I spent 45 minutes talking to the patient of  which more than half was spent in counseling and coordination of care.  No orders of the defined types were placed in this encounter.  The patient has a good understanding of the overall plan. she agrees with it. she will call with any problems that may develop before the next visit here.   Rulon Eisenmenger, MD 09/02/17

## 2017-09-02 NOTE — Telephone Encounter (Signed)
Left VM informing pt Dec 5th port placement with IR is canceled and she will discuss port placement with her surgeon Dr. Barry Dienes tomorrow. Cyndia Bent RN

## 2017-09-02 NOTE — Telephone Encounter (Signed)
Spoke to patient regarding upcoming December appointments per 11/26 los. Patient will receive updated schedule 11/27 prior to Chem Edu class.

## 2017-09-02 NOTE — Therapy (Signed)
[place], [place], [address] Phone: [phone]   Fax:  [phone]  Physical Therapy Treatment  Patient Details  Name: Gloria Ware MRN: [id number] Date of Birth: October 30, 1945 Referring Provider: Dr. [name]   Encounter Date: 09/02/2017  PT End of Session - 09/02/17 1455    Visit Number  2    Number of Visits  8    Date for PT Re-Evaluation  10/28/17    PT Start Time  1302    PT Stop Time  1354    PT Time Calculation (min)  52 min    Activity Tolerance  Patient tolerated treatment well    Behavior During Therapy  [facility] for tasks assessed/performed       Past Medical History:  Diagnosis Date  . Anxiety   . Breast CA ([place])   . Colon polyp 09/12/2005  . Depression   . Family history of breast cancer   . Genetic testing 06/27/2017   Multi-Cancer panel (83 genes) @ [facility] - No pathogenic mutations detected  . Hearing loss   . High cholesterol   . Osteopenia 11/2016   T score -2.1 stable from prior DEXA  . Tinnitus     Past Surgical History:  Procedure Laterality Date  . CARDIAC CATHETERIZATION    . cataract surg    . DILATION AND CURETTAGE OF UTERUS    . MASTECTOMY W/ SENTINEL NODE BIOPSY Right 08/06/2017   Procedure: RIGHT MASTECTOMY WITH SENTINEL LYMPH NODE BIOPSY;  Surgeon: [name], MD;  Location: [facility];  Service: General;  Laterality: Right;  . MOUTH SURGERY    . RADIOACTIVE SEED GUIDED AXILLARY SENTINEL LYMPH NODE Right 08/06/2017   Procedure: SEED TARGETED AXILLARY LYMPH NODE EXCISION;  Surgeon: [name], MD;  Location: [facility];  Service: General;  Laterality: Right;    There were no vitals filed for this visit.  Subjective Assessment - 09/02/17 1307    Subjective  Patient reports she underwent a right mastectomy and sentinel node biopsy showing 1 with disease (5 removed) on 08/06/17. She will undergo chemotherapy and have a  port placed 09/11/17. She will then undergo radiation. She broke her 5th metatarsal on her left foot on 08/04/17 which was 2 days before her mastectomy and is wearing a boot. She sees her orthopedist 09/04/17 and hopes it has healed.    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 It measured 1 cm and is located in the upper inner quadrant. 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 She underwent a right mastectomy and sentinel node biopsy on 08/06/17. She is wearing a boot on her left foot due to a fall and foot fracture 08/04/17. Her orthopedist for her foot is Dr. Doran Durand.     Patient Stated Goals  Make sure my arm is ok and that I'm strong enough for chemotherapy.    Currently in Pain?  Yes    Pain Score  2     Pain Location  Chest    Pain Orientation  Right    Pain Descriptors / Indicators  -- "Pin prick"    Pain Type  Surgical pain    Pain Onset  1 to 4 weeks ago    Pain Frequency  Intermittent    Aggravating Factors   Nothing  Pain Relieving Factors  Nothing    Multiple Pain Sites  No         [facility] PT Assessment - 09/02/17 0001      Assessment   Medical Diagnosis  s/p right mastectomy and SLNB    Referring Provider  Dr. [name]    Onset Date/Surgical Date  08/06/17    Hand Dominance  Right    Prior Therapy  none      Precautions   Precautions  Other (comment)    Precaution Comments  Cancer; right arm lymphedema risk      Restrictions   Weight Bearing Restrictions  No      Balance Screen   Has the patient fallen in the past 6 months  Yes    How many times?  1 Tripped while carrying cello    Has the patient had a decrease in activity level because of a fear of falling?   No    Is the patient reluctant to leave their home because of a fear of falling?   No      Home Film/video editor  residence    Living Arrangements  Spouse/significant other    Available Help at Discharge  Family      Prior Function   Level of Independence  Independent    Vocation  Retired    Hospital doctor in [place]  Not exercising due to foot fracture      Cognition   Overall Cognitive Status  Within Functional Limits for tasks assessed      Functional Tests   Functional tests  -- Unable to assess function due to foot fracture       Posture/Postural Control   Posture/Postural Control  Postural limitations    Postural Limitations  Rounded Shoulders;Forward head      ROM / Strength   AROM / PROM / Strength  AROM;Strength      AROM   AROM Assessment Site  Shoulder    Right/Left Shoulder  Right    Right Shoulder Extension  49 Degrees    Right Shoulder Flexion  111 Degrees    Right Shoulder ABduction  125 Degrees    Right Shoulder Internal Rotation  67 Degrees    Right Shoulder External Rotation  84 Degrees      Strength   Overall Strength  Unable to assess;Due to precautions    Overall Strength Comments  Overall weakness present since surgery and foott fracture. She is concerned about tolerating chemotherapy. She demonstrated sit to stand with use of UEs but was difficult.      Palpation   Palpation comment  Incision appears to be healing well with small scabs still present.       6 Minute Walk- Baseline   6 Minute Walk- Baseline  -- Would benefit from this test after boot is removed        LYMPHEDEMA/ONCOLOGY QUESTIONNAIRE - 09/02/17 1327      Type   Cancer Type  Right breast      Surgeries   Mastectomy Date  08/06/17    Sentinel Lymph Node Biopsy Date  08/06/17    Number Lymph Nodes Removed  5      Treatment   Active Chemotherapy Treatment  No Begins 09/16/17    Past Chemotherapy Treatment  No    Active Radiation Treatment  No    Past Radiation Treatment  No    Current  Hormone Treatment  Yes    Date  06/06/17    Drug Name   Anastrozole    Past Hormone Therapy  No      What other symptoms do you have   Are you Having Heaviness or Tightness  Yes    Are you having Pain  Yes    Are you having pitting edema  No    Is it Hard or Difficult finding clothes that fit  No    Do you have infections  No    Is there Decreased scar mobility  Yes    Stemmer Sign  No    Other Symptoms  n      Right Upper Extremity Lymphedema   10 cm Proximal to Olecranon Process  28.9 cm    Olecranon Process  22.7 cm    10 cm Proximal to Ulnar Styloid Process  20.5 cm    Just Proximal to Ulnar Styloid Process  13.8 cm    Across Hand at [employer]  16.2 cm    At [place] of 2nd Digit  5.5 cm      Left Upper Extremity Lymphedema   10 cm Proximal to Olecranon Process  28.5 cm    Olecranon Process  22.2 cm    10 cm Proximal to Ulnar Styloid Process  20.2 cm    Just Proximal to Ulnar Styloid Process  13.2 cm    Across Hand at [employer]  16 cm    At [place] of 2nd Digit  5.4 cm        Quick Dash - 09/02/17 0001    Open a tight or new jar  Mild difficulty    Do heavy household chores (wash walls, wash floors)  Mild difficulty    Carry a shopping bag or briefcase  No difficulty    Wash your back  No difficulty    Use a knife to cut food  No difficulty    Recreational activities in which you take some force or impact through your arm, shoulder, or hand (golf, hammering, tennis)  Unable    During the past week, to what extent has your arm, shoulder or hand problem interfered with your normal social activities with family, friends, neighbors, or groups?  Not at all    During the past week, to what extent has your arm, shoulder or hand problem limited your work or other regular daily activities  Slightly    Arm, shoulder, or hand pain.  None    Tingling (pins and needles) in your arm, shoulder, or hand  None    Difficulty Sleeping  No difficulty    DASH Score  15.91 %        Long Term Clinic Goals - 09/02/17 1504      CC Long  Term Goal  #1   Title  Patient will perform home exercise program correctly and independently.    Time  8    Period  Weeks    Status  New      CC Long Term Goal  #2   Title  Patient will demonstrate right shoulder active flexion at >/= 130 degrees for increased ease reaching overhead.    Baseline  111 degrees today    Time  8    Period  Weeks    Status  New      CC Long Term Goal  #3   Title  Patient will demonstrate right shoulder active abduction at >/= 137  degrees for increased ease reaching overhead.    Baseline  125 degrees today    Time  8    Period  Weeks    Status  New      CC Long Term Goal  #4   Title  Patient will report she is able to participate in a regular walking program for 30 minutes, 5-6 days per week for reducing recurrence risk and better tolerance of chemotherapy.    Baseline  Unable to walk long distance with boot on.    Time  8    Period  Weeks    Status  New      CC Long Term Goal  #5   Title  Patient will verbalize understanding of risk reduction practices related to lymphedema.    Time  8    Period  Weeks    Status  New      CC Long Term Goal  #6   Title  Quick DASH score reduced to </= 4 for improved function of right arm.    Time  8    Period  Weeks    Status  New      Additional Goals   Additional Goals  Yes         Plan - 09/02/17 1457    Clinical Impression Statement  Patient underwent a right mastectomy and targeted axillary node dissection on 08/06/17. She had disease in 1 node and is scheduled to begin chemotherapy on 09/16/17 due to a positive Mammaprint test. She is healing well from her surgery but is limited with shoulder ROM. She also demonstrates overall weakness and some tremors and is concerned about having enough mental and physical strength to endure chemotherapy. She will benefit from the weekly Prehab exercise class and also skilled physical therapy once a week to build strength and improve shoulder ROM. She hopes to get  her left boot off on 09/04/17 when she sees her orthopedist.    History and Personal Factors relevant to plan of care:  Left 5th metatarsal fracture 08/02/17; soon will begin chemotherapy; depression    Clinical Presentation  Evolving    Rehab Potential  Excellent    Clinical Impairments Affecting Rehab Potential  Left foot fracture    PT Frequency  1x / week    PT Duration  8 weeks    PT Treatment/Interventions  Therapeutic exercise;Patient/family education;Balance training;Neuromuscular re-education;Gait training;ADLs/Self Care Home Management;Therapeutic activities;Functional mobility training;Manual techniques;Passive range of motion;Scar mobilization    PT Next Visit Plan  Begin Nustep (plans to do bike at gym); seated leg strengthening exercises; pulleys for shoulder, supine cane; PROM right shoulder    Consulted and Agree with Plan of Care  Patient       Patient will benefit from skilled therapeutic intervention in order to improve the following deficits and impairments:  Postural dysfunction, Decreased knowledge of precautions, Pain, Impaired UE functional use, Decreased range of motion, Difficulty walking, Decreased activity tolerance, Decreased strength  Visit Diagnosis: Muscle weakness (generalized) - Plan: PT plan of care cert/re-cert  Carcinoma of upper-inner quadrant of right breast in female, estrogen receptor positive ([place]) - Plan: PT plan of care cert/re-cert  Abnormal posture - Plan: PT plan of care cert/re-cert  Stiffness of right shoulder, not elsewhere classified - Plan: PT plan of care cert/re-cert  Difficulty in walking, not elsewhere classified - Plan: PT plan of care cert/re-cert   G-Codes - 69/62/95 1508    Functional Assessment Tool Used (Outpatient Only)  Quick DASH  Functional Limitation  Other PT primary    Other PT Primary Current Status (W0981)  At least 1 percent but less than 20 percent impaired, limited or restricted    Other PT Primary Goal Status  (X9147)  At least 1 percent but less than 20 percent impaired, limited or restricted       Problem List Patient Active Problem List   Diagnosis Date Noted  . Breast cancer metastasized to multiple sites, right ([place]) 08/06/2017  . Genetic testing 06/27/2017  . Family history of breast cancer   . Breast CA ([place])   . Malignant neoplasm of upper-inner quadrant of right breast in female, estrogen receptor positive ([place]) 06/26/2017    Annia Friendly, PT 09/02/17 3:11 PM  [place], [place], [address] Phone: [phone]   Fax:  [phone]  Name: MALESHA SULIMAN MRN: [id number] Date of Birth: 1945-11-20

## 2017-09-02 NOTE — Progress Notes (Signed)
START ON PATHWAY REGIMEN - Breast   Doxorubicin + Cyclophosphamide [facility]):   A cycle is every 21 days:     Doxorubicin      Cyclophosphamide   **Always confirm dose/schedule in your pharmacy ordering system**    Paclitaxel 80 mg/m2 Weekly:   Administer weekly:     Paclitaxel   **Always confirm dose/schedule in your pharmacy ordering system**    Patient Characteristics: Postoperative without Neoadjuvant Therapy (Pathologic Staging), Invasive Disease, Adjuvant Therapy, HER2 Negative/Unknown/Equivocal, ER Positive, Node Positive, Node Positive (1-3), MammaPrint(R) Ordered, High Genomic Risk Therapeutic Status: Postoperative without Neoadjuvant Therapy (Pathologic Staging) AJCC Grade: GX AJCC N Category: pNX AJCC M Category: cM0 ER Status: Positive (+) AJCC 8 Stage Grouping: IA HER2 Status: Negative (-) Oncotype Dx Recurrence Score: Ordered Other Genomic Test AJCC T Category: pTX PR Status: Positive (+) Has this patient completed genomic testing<= Yes - MammaPrint(R) MammaPrint(R) Score: High Genomic Risk Intent of Therapy: Curative Intent, Discussed with Patient

## 2017-09-02 NOTE — Assessment & Plan Note (Signed)
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 I discussed the margins as well as lymph node surgeries. We also discussed the final staging along with previously performed ER/PR and HER-2/neu testing.  Treatment plan: 1.  Mammaprint high risk: Dose since Adriamycin and Cytoxan x4 followed by Taxol weekly x12  2. adjuvant radiation therapy 3.  Followed by adjuvant antiestrogen therapy ---------------------------------------------------------------------Chemotherapy Counseling: I discussed the risks and benefits of chemotherapy including the risks of nausea/ vomiting, risk of infection from low WBC count, fatigue due to chemo or anemia, bruising or bleeding due to low platelets, mouth sores, loss/ change in taste and decreased appetite. Liver and kidney function will be monitored through out chemotherapy as abnormalities in liver and kidney function may be a side effect of treatment. Cardiac dysfunction due to Adriamycin Herceptin and Perjeta were discussed in detail. Risk of permanent bone marrow dysfunction and leukemia due to chemo were also discussed.  UPBEAT clinical trial ([id number]): Newly diagnosed stage I to III breast cancer patients receiving either adjuvant or neoadjuvant chemotherapy undergo cardiac MRI before treatment and at 24 months along with neurocognitive testing, exercise and disability measures at baseline 3, 12 and 24 months.  Return to clinic in 2 weeks to start chemotherapy.   

## 2017-09-03 ENCOUNTER — Encounter: Payer: Self-pay | Admitting: Hematology and Oncology

## 2017-09-03 ENCOUNTER — Other Ambulatory Visit: Payer: Medicare Other

## 2017-09-03 ENCOUNTER — Encounter: Payer: Self-pay | Admitting: *Deleted

## 2017-09-03 NOTE — Progress Notes (Signed)
Submitted auth request for Lidocaine/Prilocaine today.  Status is pending.  °

## 2017-09-03 NOTE — Progress Notes (Signed)
Pt's Lidocaine-Prilocaine was approved from 06/05/17 - 12/02/17.

## 2017-09-04 ENCOUNTER — Ambulatory Visit: Payer: Medicare Other

## 2017-09-04 DIAGNOSIS — R293 Abnormal posture: Secondary | ICD-10-CM

## 2017-09-04 DIAGNOSIS — M6281 Muscle weakness (generalized): Secondary | ICD-10-CM | POA: Diagnosis not present

## 2017-09-04 DIAGNOSIS — R262 Difficulty in walking, not elsewhere classified: Secondary | ICD-10-CM

## 2017-09-04 DIAGNOSIS — Z170 Estrogen receptor positive status [ER+]: Secondary | ICD-10-CM | POA: Diagnosis not present

## 2017-09-04 DIAGNOSIS — C50211 Malignant neoplasm of upper-inner quadrant of right female breast: Secondary | ICD-10-CM | POA: Diagnosis not present

## 2017-09-04 DIAGNOSIS — M25611 Stiffness of right shoulder, not elsewhere classified: Secondary | ICD-10-CM | POA: Diagnosis not present

## 2017-09-04 DIAGNOSIS — S92302D Fracture of unspecified metatarsal bone(s), left foot, subsequent encounter for fracture with routine healing: Secondary | ICD-10-CM | POA: Diagnosis not present

## 2017-09-04 NOTE — Patient Instructions (Addendum)
Cancer Rehab [phone] Seated Alternating Leg Raise (Marching)    Sit on chair. Raise bent knee and return. Repeat with other leg. Do __1-2_ sets of _10__ repetitions.   Long [employer]    Straighten operated leg and try to hold it __5__ seconds. Use __2__ lbs on ankle. Repeat __10__ times. Do _1-2___ sessions a day.   Seated for ball squeeze: Place ball between knees and squeeze holding for 5 seconds, 10-20 times.  Then place red theraband around knees and open/close knees against band holding 5 seconds, 10-20 times.   SHOULDER: Flexion - Supine (Cane)           Hold cane in both hands. Raise arms up overhead. Do not allow back to arch. Hold _5__ seconds. Do __5-10__ times; __1-2__ times a day.   SELF ASSISTED WITH OBJECT: Shoulder Abduction / Adduction - Supine    Hold cane with both hands. Move both arms from side to side, keep elbows straight.  Hold when stretch felt for __5__ seconds. Repeat __5-10__ times; __1-2__ times a day. Once this becomes easier progress to third picture bringing affected arm towards ear by staying out to side. Same hold for _5_seconds. Repeat  _5-10_ times, _1-2_ times/day.  Shoulder Blade Stretch    Clasp fingers behind head with elbows touching in front of face. Pull elbows back while pressing shoulder blades together. Relax and hold as tolerated, can place pillow under elbow here for comfort as needed and to allow for prolonged stretch.  Repeat __5__ times. Do __1-2__ sessions per day.     SHOULDER: External Rotation - Supine (Cane)    Hold cane with both hands. Rotate arm away from body. Keep elbow on floor and next to body. _5-10__ reps per set, hold 5 seconds, _1-2__ sets per day. Add towel to keep elbow at side.  Copyright  VHI. All rights reserved.

## 2017-09-04 NOTE — Therapy (Signed)
[place], [place], [address] Phone: [phone]   Fax:  [phone]  Physical Therapy Treatment  Patient Details  Name: Gloria Ware MRN: [id number] Date of Birth: May 16, 1946 Referring Provider: Dr. [name]   Encounter Date: 09/04/2017  PT End of Session - 09/04/17 1430    Visit Number  3    Number of Visits  8    Date for PT Re-Evaluation  10/28/17    PT Start Time  3662    PT Stop Time  1430    PT Time Calculation (min)  38 min    Activity Tolerance  Patient tolerated treatment well    Behavior During Therapy  [facility] for tasks assessed/performed       Past Medical History:  Diagnosis Date  . Anxiety   . Breast CA ([place])   . Colon polyp 09/12/2005  . Depression   . Family history of breast cancer   . Genetic testing 06/27/2017   Multi-Cancer panel (83 genes) @ [facility] - No pathogenic mutations detected  . Hearing loss   . High cholesterol   . Osteopenia 11/2016   T score -2.1 stable from prior DEXA  . Tinnitus     Past Surgical History:  Procedure Laterality Date  . CARDIAC CATHETERIZATION    . cataract surg    . DILATION AND CURETTAGE OF UTERUS    . MASTECTOMY W/ SENTINEL NODE BIOPSY Right 08/06/2017   Procedure: RIGHT MASTECTOMY WITH SENTINEL LYMPH NODE BIOPSY;  Surgeon: [name], MD;  Location: [place];  Service: General;  Laterality: Right;  . MOUTH SURGERY    . RADIOACTIVE SEED GUIDED AXILLARY SENTINEL LYMPH NODE Right 08/06/2017   Procedure: SEED TARGETED AXILLARY LYMPH NODE EXCISION;  Surgeon: [name], MD;  Location: [place];  Service: General;  Laterality: Right;    There were no vitals filed for this visit.  Subjective Assessment - 09/04/17 1400    Subjective  I have an orthopedist appt right after this visit so have to leave right at 2:30, I'm hoping I don't have to wear my boot anymore.     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 It measured 1 cm and is located in the upper inner quadrant. 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 She underwent a right mastectomy and sentinel node biopsy on 08/06/17. She is wearing a boot on her left foot due to a fall and foot fracture 08/04/17. Her orthopedist for her foot is Dr. Doran Durand.     Patient Stated Goals  Make sure my arm is ok and that I'm strong enough for chemotherapy.    Currently in Pain?  No/denies                      [facility] Adult PT Treatment/Exercise - 09/04/17 0001      Knee/Hip Exercises: Aerobic   Nustep  Level 4 x6 minutes      Knee/Hip Exercises: Seated   Long Arc Quad  Strengthening;Both;10 reps;Weights    Long Arc Quad Weight  2 lbs. Rt ankle, boot on Lt    Ball Squeeze  10 times    Clamshell with TheraBand  Red 10 times    Marching  Strengthening;Both;10 reps 2# on Rt, boot on Lt  Shoulder Exercises: Supine   Horizontal ABduction  AAROM;Right;5 reps With dowel hold 5 sec    External Rotation  AAROM;Right      Shoulder Exercises: Pulleys   Flexion  2 minutes    ABduction  2 minutes    ABduction Limitations  VCs to decrease scapular compensation and to stay in abduction             PT Education - 09/04/17 1413    Education provided  Yes           Breast Clinic Goals - 06/26/17 2119      Patient will be able to verbalize understanding of pertinent lymphedema risk reduction practices relevant to her diagnosis specifically related to skin care.   Time  1    Period  Days    Status  Achieved      Patient will be able to return demonstrate and/or verbalize understanding of the post-op home exercise program related to regaining shoulder range of motion.   Time  1    Period  Days    Status  Achieved      Patient will be able to verbalize  understanding of the importance of attending the postoperative After Breast Cancer Class for further lymphedema risk reduction education and therapeutic exercise.   Time  1    Period  Days    Status  Achieved       Long Term Clinic Goals - 09/02/17 1504      CC Long Term Goal  #1   Title  Patient will perform home exercise program correctly and independently.    Time  8    Period  Weeks    Status  New      CC Long Term Goal  #2   Title  Patient will demonstrate right shoulder active flexion at >/= 130 degrees for increased ease reaching overhead.    Baseline  111 degrees today    Time  8    Period  Weeks    Status  New      CC Long Term Goal  #3   Title  Patient will demonstrate right shoulder active abduction at >/= 137 degrees for increased ease reaching overhead.    Baseline  125 degrees today    Time  8    Period  Weeks    Status  New      CC Long Term Goal  #4   Title  Patient will report she is able to participate in a regular walking program for 30 minutes, 5-6 days per week for reducing recurrence risk and better tolerance of chemotherapy.    Baseline  Unable to walk long distance with boot on.    Time  8    Period  Weeks    Status  New      CC Long Term Goal  #5   Title  Patient will verbalize understanding of risk reduction practices related to lymphedema.    Time  8    Period  Weeks    Status  New      CC Long Term Goal  #6   Title  Quick DASH score reduced to </= 4 for improved function of right arm.    Time  8    Period  Weeks    Status  New      Additional Goals   Additional Goals  Yes         Plan - 09/04/17 1431  Clinical Impression Statement  Pt did very well with inital exercises today and added these to her EHP. She sees orthopedist today and hopes to get her boot off.     Rehab Potential  Excellent    Clinical Impairments Affecting Rehab Potential  Left foot fracture    PT Frequency  1x / week    PT Duration  8 weeks    PT  Treatment/Interventions  Therapeutic exercise;Patient/family education;Balance training;Neuromuscular re-education;Gait training;ADLs/Self Care Home Management;Therapeutic activities;Functional mobility training;Manual techniques;Passive range of motion;Scar mobilization    PT Next Visit Plan  Cont Nustep and review HEP; cont Rt shoulder ROM and LE strength; assess if boot off    PT Home Exercise Plan  Seated LE strngth and supine dowel exercises    Consulted and Agree with Plan of Care  Patient       Patient will benefit from skilled therapeutic intervention in order to improve the following deficits and impairments:  Postural dysfunction, Decreased knowledge of precautions, Pain, Impaired UE functional use, Decreased range of motion, Difficulty walking, Decreased activity tolerance, Decreased strength  Visit Diagnosis: Muscle weakness (generalized)  Abnormal posture  Stiffness of right shoulder, not elsewhere classified  Difficulty in walking, not elsewhere classified     Problem List Patient Active Problem List   Diagnosis Date Noted  . Breast cancer metastasized to multiple sites, right ([place]) 08/06/2017  . Genetic testing 06/27/2017  . Family history of breast cancer   . Breast CA ([place])   . Malignant neoplasm of upper-inner quadrant of right breast in female, estrogen receptor positive ([place]) 06/26/2017    Otelia Limes, PTA 09/04/2017, 2:33 PM  [place], [place], [address] Phone: [phone]   Fax:  [phone]  Name: Gloria Ware MRN: [id number] Date of Birth: 08/04/1946

## 2017-09-05 ENCOUNTER — Ambulatory Visit (HOSPITAL_COMMUNITY)
Admission: RE | Admit: 2017-09-05 | Discharge: 2017-09-05 | Disposition: A | Discharge status: Home / Self Care | Payer: Medicare Other | Source: Ambulatory Visit | Attending: General Surgery | Admitting: General Surgery

## 2017-09-05 ENCOUNTER — Encounter: Payer: Self-pay | Admitting: General Practice

## 2017-09-05 ENCOUNTER — Other Ambulatory Visit: Payer: Self-pay | Admitting: General Surgery

## 2017-09-05 ENCOUNTER — Encounter (HOSPITAL_COMMUNITY)
Admission: RE | Admit: 2017-09-05 | Discharge: 2017-09-05 | Disposition: A | Discharge status: Home / Self Care | Payer: Medicare Other | Source: Ambulatory Visit | Attending: General Surgery | Admitting: General Surgery

## 2017-09-05 DIAGNOSIS — M5137 Other intervertebral disc degeneration, lumbosacral region: Secondary | ICD-10-CM | POA: Diagnosis not present

## 2017-09-05 DIAGNOSIS — C50211 Malignant neoplasm of upper-inner quadrant of right female breast: Secondary | ICD-10-CM | POA: Insufficient documentation

## 2017-09-05 DIAGNOSIS — Z9011 Acquired absence of right breast and nipple: Secondary | ICD-10-CM | POA: Diagnosis not present

## 2017-09-05 DIAGNOSIS — M5136 Other intervertebral disc degeneration, lumbar region: Secondary | ICD-10-CM | POA: Insufficient documentation

## 2017-09-05 DIAGNOSIS — C50911 Malignant neoplasm of unspecified site of right female breast: Secondary | ICD-10-CM | POA: Diagnosis not present

## 2017-09-05 MED ORDER — IOPAMIDOL (ISOVUE-300) INJECTION 61%
100.0000 mL | Freq: Once | INTRAVENOUS | Status: AC | PRN
  Administered 2017-09-05: 100 mL via INTRAVENOUS

## 2017-09-05 MED ORDER — TECHNETIUM TC 99M MEDRONATE IV KIT
25.0000 | PACK | Freq: Once | INTRAVENOUS | Status: AC | PRN
  Administered 2017-09-05: 25 via INTRAVENOUS

## 2017-09-05 NOTE — Progress Notes (Signed)
[  preop on 12/4.  Surgery on 12/6.  Need orders in epic.

## 2017-09-05 NOTE — Progress Notes (Signed)
[facility] Spiritual Care Note  Received and returned call from [place]. LVM with available appt times and encouragement to call back.   [place], [place], [facility] Pager [phone] Voicemail [phone]

## 2017-09-06 LAB — POCT I-STAT CREATININE: CREATININE: 0.8 mg/dL (ref 0.44–1.00)

## 2017-09-06 NOTE — Progress Notes (Signed)
CT chest 09-05-17 epic  Creatinine 09-05-17 epic

## 2017-09-06 NOTE — Patient Instructions (Addendum)
Gloria Ware  09/06/2017   Your procedure is scheduled on: 09-12-17  Report to [facility] Main  Entrance Take [place]  elevators to 3rd floor to  [place] at 5:45AM.    Call this number if you have problems the morning of surgery [phone]   Remember: ONLY 1 PERSON MAY GO WITH YOU TO SHORT STAY TO GET  READY MORNING OF YOUR SURGERY.  Do not eat food or drink liquids :After Midnight.     Take these medicines the morning of surgery with A SIP OF WATER: NONE                                 You may not have any metal on your body including hair pins and              piercings  Do not wear jewelry, make-up, lotions, powders or perfumes, deodorant             Do not wear nail polish.  Do not shave  48 hours prior to surgery.              Men may shave face and neck.   Do not bring valuables to the hospital. [place].  Contacts, dentures or bridgework may not be worn into surgery.      Patients discharged the day of surgery will not be allowed to drive home.  Name and phone number of your driver:  Special Instructions: N/A              Please read over the following fact sheets you were given: _____________________________________________________________________             [facility] - Preparing for Surgery Before surgery, you can play an important role.  Because skin is not sterile, your skin needs to be as free of germs as possible.  You can reduce the number of germs on your skin by washing with CHG (chlorahexidine gluconate) soap before surgery.  CHG is an antiseptic cleaner which kills germs and bonds with the skin to continue killing germs even after washing. Please DO NOT use if you have an allergy to CHG or antibacterial soaps.  If your skin becomes reddened/irritated stop using the CHG and inform your nurse when you arrive at Short Stay. Do not shave (including legs and underarms) for at  least 48 hours prior to the first CHG shower.  You may shave your face/neck. Please follow these instructions carefully:  1.  Shower with CHG Soap the night before surgery and the  morning of Surgery.  2.  If you choose to wash your hair, wash your hair first as usual with your  normal  shampoo.  3.  After you shampoo, rinse your hair and body thoroughly to remove the  shampoo.                           4.  Use CHG as you would any other liquid soap.  You can apply chg directly  to the skin and wash                       Gently  with a scrungie or clean washcloth.  5.  Apply the CHG Soap to your body ONLY FROM THE NECK DOWN.   Do not use on face/ open                           Wound or open sores. Avoid contact with eyes, ears mouth and genitals (private parts).                       Wash face,  Genitals (private parts) with your normal soap.             6.  Wash thoroughly, paying special attention to the area where your surgery  will be performed.  7.  Thoroughly rinse your body with warm water from the neck down.  8.  DO NOT shower/wash with your normal soap after using and rinsing off  the CHG Soap.                9.  Pat yourself dry with a clean towel.            10.  Wear clean pajamas.            11.  Place clean sheets on your bed the night of your first shower and do not  sleep with pets. Day of Surgery : Do not apply any lotions/deodorants the morning of surgery.  Please wear clean clothes to the hospital/surgery center.  FAILURE TO FOLLOW THESE INSTRUCTIONS MAY RESULT IN THE CANCELLATION OF YOUR SURGERY PATIENT SIGNATURE_________________________________  NURSE SIGNATURE__________________________________  ________________________________________________________________________

## 2017-09-06 NOTE — Progress Notes (Signed)
Please let patient know scans show no evidence of any spread of cancer.

## 2017-09-09 ENCOUNTER — Ambulatory Visit (HOSPITAL_COMMUNITY)
Admission: RE | Admit: 2017-09-09 | Discharge: 2017-09-09 | Disposition: A | Discharge status: Home / Self Care | Payer: Medicare Other | Source: Ambulatory Visit | Attending: Hematology and Oncology | Admitting: Hematology and Oncology

## 2017-09-09 ENCOUNTER — Encounter: Payer: Self-pay | Admitting: *Deleted

## 2017-09-09 ENCOUNTER — Encounter: Payer: Medicare Other | Admitting: *Deleted

## 2017-09-09 DIAGNOSIS — Z5181 Encounter for therapeutic drug level monitoring: Secondary | ICD-10-CM | POA: Diagnosis not present

## 2017-09-09 DIAGNOSIS — Z79899 Other long term (current) drug therapy: Secondary | ICD-10-CM | POA: Diagnosis not present

## 2017-09-09 DIAGNOSIS — C50919 Malignant neoplasm of unspecified site of unspecified female breast: Secondary | ICD-10-CM | POA: Insufficient documentation

## 2017-09-09 NOTE — Progress Notes (Signed)
  Echocardiogram 2D Echocardiogram has been performed.  Angeleena Dueitt L Androw 09/09/2017, 11:02 AM

## 2017-09-10 ENCOUNTER — Encounter (HOSPITAL_COMMUNITY)
Admission: RE | Admit: 2017-09-10 | Discharge: 2017-09-10 | Disposition: A | Discharge status: Home / Self Care | Payer: Medicare Other | Source: Ambulatory Visit | Attending: General Surgery | Admitting: General Surgery

## 2017-09-10 ENCOUNTER — Other Ambulatory Visit: Payer: Self-pay

## 2017-09-10 ENCOUNTER — Encounter (HOSPITAL_COMMUNITY): Payer: Self-pay

## 2017-09-10 DIAGNOSIS — M8580 Other specified disorders of bone density and structure, unspecified site: Secondary | ICD-10-CM | POA: Diagnosis not present

## 2017-09-10 DIAGNOSIS — F419 Anxiety disorder, unspecified: Secondary | ICD-10-CM | POA: Diagnosis not present

## 2017-09-10 DIAGNOSIS — F329 Major depressive disorder, single episode, unspecified: Secondary | ICD-10-CM | POA: Diagnosis not present

## 2017-09-10 DIAGNOSIS — C50911 Malignant neoplasm of unspecified site of right female breast: Secondary | ICD-10-CM | POA: Diagnosis not present

## 2017-09-10 DIAGNOSIS — Z880 Allergy status to penicillin: Secondary | ICD-10-CM | POA: Diagnosis not present

## 2017-09-10 DIAGNOSIS — Z79899 Other long term (current) drug therapy: Secondary | ICD-10-CM | POA: Diagnosis not present

## 2017-09-10 DIAGNOSIS — Z7982 Long term (current) use of aspirin: Secondary | ICD-10-CM | POA: Diagnosis not present

## 2017-09-10 HISTORY — DX: Nausea with vomiting, unspecified: Z98.890

## 2017-09-10 HISTORY — DX: Other specified postprocedural states: R11.2

## 2017-09-10 HISTORY — DX: Dermatitis, unspecified: L30.9

## 2017-09-10 HISTORY — DX: Injury, unspecified, initial encounter: T14.90XA

## 2017-09-10 LAB — CBC
HEMATOCRIT: 37.4 % (ref 36.0–46.0)
HEMOGLOBIN: 12.2 g/dL (ref 12.0–15.0)
MCH: 29 pg (ref 26.0–34.0)
MCHC: 32.6 g/dL (ref 30.0–36.0)
MCV: 89 fL (ref 78.0–100.0)
Platelets: 225 10*3/uL (ref 150–400)
RBC: 4.2 MIL/uL (ref 3.87–5.11)
RDW: 13.7 % (ref 11.5–15.5)
WBC: 5.4 10*3/uL (ref 4.0–10.5)

## 2017-09-10 LAB — BASIC METABOLIC PANEL
ANION GAP: 8 (ref 5–15)
BUN: 17 mg/dL (ref 6–20)
CO2: 26 mmol/L (ref 22–32)
Calcium: 9.5 mg/dL (ref 8.9–10.3)
Chloride: 103 mmol/L (ref 101–111)
Creatinine, Ser: 0.83 mg/dL (ref 0.44–1.00)
GFR calc Af Amer: >60 mL/min (ref >60)
GLUCOSE: 96 mg/dL (ref 65–99)
POTASSIUM: 4.1 mmol/L (ref 3.5–5.1)
Sodium: 137 mmol/L (ref 135–145)

## 2017-09-11 ENCOUNTER — Other Ambulatory Visit (HOSPITAL_COMMUNITY): Payer: Medicare Other

## 2017-09-11 ENCOUNTER — Ambulatory Visit: Payer: Medicare Other | Attending: General Surgery

## 2017-09-11 DIAGNOSIS — M6281 Muscle weakness (generalized): Secondary | ICD-10-CM | POA: Insufficient documentation

## 2017-09-11 DIAGNOSIS — M25611 Stiffness of right shoulder, not elsewhere classified: Secondary | ICD-10-CM | POA: Diagnosis not present

## 2017-09-11 DIAGNOSIS — R293 Abnormal posture: Secondary | ICD-10-CM | POA: Insufficient documentation

## 2017-09-11 DIAGNOSIS — R262 Difficulty in walking, not elsewhere classified: Secondary | ICD-10-CM | POA: Diagnosis not present

## 2017-09-11 NOTE — Patient Instructions (Signed)
Over Head Pull: Narrow and Wide Grip   Cancer Rehab [phone]   On back, knees bent, feet flat, band across thighs, elbows straight but relaxed. Pull hands apart (start). Keeping elbows straight, bring arms up and over head, hands toward floor. Keep pull steady on band. Hold momentarily. Return slowly, keeping pull steady, back to start. Then do same with a wider grip on the band (past shoulder width) Repeat _5-10__ times. Band color __yellow____   Side Pull: Double Arm   On back, knees bent, feet flat. Arms perpendicular to body, shoulder level, elbows straight but relaxed. Pull arms out to sides, elbows straight. Resistance band comes across collarbones, hands toward floor. Hold momentarily. Slowly return to starting position. Repeat _5-10__ times. Band color _yellow____   Sword   On back, knees bent, feet flat, left hand on left hip, right hand above left. Pull right arm DIAGONALLY (hip to shoulder) across chest. Bring right arm along head toward floor. Hold momentarily. Slowly return to starting position. Repeat _5-10__ times. Do with left arm. Band color _yellow_____   Shoulder Rotation: Double Arm   On back, knees bent, feet flat, elbows tucked at sides, bent 90, hands palms up. Pull hands apart and down toward floor, keeping elbows near sides. Hold momentarily. Slowly return to starting position. Repeat _5-10__ times. Band color __yellow____   CHEST: Doorway, Bilateral - Standing    Standing in doorway with one foot in front of other, place hands on wall with elbows bent at shoulder height. Lean forward. Hold _20-30__ seconds. _3-5__ reps per set, _2-3__ sets per day

## 2017-09-11 NOTE — Anesthesia Preprocedure Evaluation (Addendum)
Anesthesia Evaluation  Patient identified by MRN, date of birth, ID band Patient awake    Reviewed: Allergy & Precautions, NPO status , Patient's Chart, lab work & pertinent test results  History of Anesthesia Complications Negative for: history of anesthetic complications  Airway Mallampati: II  TM Distance: >3 FB Neck ROM: Full    Dental  (+) Teeth Intact, Dental Advisory Given   Pulmonary neg pulmonary ROS,    Pulmonary exam normal        Cardiovascular negative cardio ROS Normal cardiovascular exam  Study Conclusions  - Left ventricle: Global longitudinal strain is -22.5% The cavity   size was normal. Wall thickness was normal. Systolic function was   vigorous. The estimated ejection fraction was in the range of 65%   to 70%.    Neuro/Psych PSYCHIATRIC DISORDERS Anxiety Depression negative neurological ROS     GI/Hepatic negative GI ROS, Neg liver ROS,   Endo/Other  negative endocrine ROS  Renal/GU negative Renal ROS     Musculoskeletal   Abdominal   Peds  Hematology negative hematology ROS (+)   Anesthesia Other Findings   Reproductive/Obstetrics negative OB ROS                            Anesthesia Physical  Anesthesia Plan  ASA: II  Anesthesia Plan: General   Post-op Pain Management:    Induction: Intravenous  PONV Risk Score and Plan: 3 and Ondansetron, Dexamethasone, Diphenhydramine and Treatment may vary due to age or medical condition  Airway Management Planned: LMA  Additional Equipment:   Intra-op Plan:   Post-operative Plan: Extubation in OR  Informed Consent: I have reviewed the patients History and Physical, chart, labs and discussed the procedure including the risks, benefits and alternatives for the proposed anesthesia with the patient or authorized representative who has indicated his/her understanding and acceptance.   Dental advisory  given  Plan Discussed with: Anesthesiologist and CRNA  Anesthesia Plan Comments:        Anesthesia Quick Evaluation

## 2017-09-11 NOTE — Therapy (Signed)
[place], [place], [address] Phone: [phone]   Fax:  [phone]  Physical Therapy Treatment  Patient Details  Name: Gloria Ware MRN: [id number] Date of Birth: July 03, 1946 Referring Provider: Dr. [name]   Encounter Date: 09/11/2017  PT End of Session - 09/11/17 1107    Visit Number  4    Number of Visits  8    Date for PT Re-Evaluation  10/28/17    PT Start Time  1019    PT Stop Time  1106    PT Time Calculation (min)  47 min    Activity Tolerance  Patient tolerated treatment well    Behavior During Therapy  [facility] for tasks assessed/performed       Past Medical History:  Diagnosis Date  . Anxiety   . Breast CA ([place])   . Colon polyp 09/12/2005  . Depression   . Eczema    right forearm   . Family history of breast cancer   . Genetic testing 06/27/2017   Multi-Cancer panel (83 genes) @ [facility] - No pathogenic mutations detected  . Hearing loss   . High cholesterol   . Injury     left foot metatarsal fracture sustined 08-03-17 , treated with boot; patient  reports  improvement as of today  . Osteopenia 11/2016   T score -2.1 stable from prior DEXA  . PONV (postoperative nausea and vomiting)    post op nausea  . Tinnitus     Past Surgical History:  Procedure Laterality Date  . CARDIAC CATHETERIZATION     husband was having symtpoms and needed a stent; subsequently she states she started " to feel something"  but thinks it was " all in my head bc of my hbsand". subsequently saw same cardiologist as her husband who sent for treadmill stress that had inconcluisicve results so she was sent for cardiac catherization which she states was " definitely negative "   . cataract surg    . DILATION AND CURETTAGE OF UTERUS    . MASTECTOMY W/ SENTINEL NODE BIOPSY Right 08/06/2017   Procedure: RIGHT MASTECTOMY WITH SENTINEL LYMPH NODE BIOPSY;  Surgeon: [name], MD;  Location: [place];  Service: General;  Laterality: Right;  . MOUTH SURGERY    . RADIOACTIVE SEED GUIDED AXILLARY SENTINEL LYMPH NODE Right 08/06/2017   Procedure: SEED TARGETED AXILLARY LYMPH NODE EXCISION;  Surgeon: [name], MD;  Location: [place];  Service: General;  Laterality: Right;    There were no vitals filed for this visit.  Subjective Assessment - 09/11/17 1025    Subjective  I've been doing okay but haven't been consistent with my HEP much. 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 I got my boot off so that's nice, I only have to use it prn now and that isn't often. I get my port tomorrow and start chemo Monday.     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 It measured 1 cm and is located in the upper inner quadrant. 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 She underwent a right mastectomy and sentinel node biopsy on 08/06/17. She is wearing a boot on her left foot due to a  fall and foot fracture 08/04/17. Her orthopedist for her foot is Dr. Doran Durand.     Patient Stated Goals  Make sure my arm is ok and that I'm strong enough for chemotherapy.    Currently in Pain?  No/denies                      [facility] Adult PT Treatment/Exercise - 09/11/17 0001      Knee/Hip Exercises: Aerobic   Nustep  Level 4 x6 minutes      Knee/Hip Exercises: Seated   Long Arc Quad  Strengthening;Both;20 reps;Weights    Long Arc Quad Weight  2 lbs.    Marching  Strengthening;Both;20 reps;Weights    Marching Weights  2 lbs.      Shoulder Exercises: Supine   Horizontal ABduction  Strengthening;Both;10 reps;Theraband    Theraband Level (Shoulder Horizontal ABduction)  Level 1 (Yellow)    External Rotation  Strengthening;Both;10  reps;Theraband    Theraband Level (Shoulder External Rotation)  Level 1 (Yellow)    Flexion  Strengthening;Both;5 reps Narrow and Wide Grip, 5 times each    ABduction  Strengthening;Both;10 reps;Theraband    Other Supine Exercises  Lt D2 with yellow theraband 10 times with pt returning therapist demonstration of all above exs; tried this with Rt UE but pt had "clicking" so tried with no resistance but sensation continued so instructed pt to not do this with Rt UE for now.      Shoulder Exercises: Pulleys   Flexion  2 minutes    ABduction  2 minutes      Shoulder Exercises: Stretch   Corner Stretch  3 reps;20 seconds In Secretary/administrator Limitations  Pt returning corect therapist demonstration             PT Education - 09/11/17 1101    Education provided  Yes    Education Details  Supine scapular series with yellow theraband and doorway pectoralis stretch    Person(s) Educated  Patient    Methods  Explanation;Demonstration;Handout    Comprehension  Verbalized understanding;Returned demonstration;Need further instruction           Breast Clinic Goals - 06/26/17 2119      Patient will be able to verbalize understanding of pertinent lymphedema risk reduction practices relevant to her diagnosis specifically related to skin care.   Time  1    Period  Days    Status  Achieved      Patient will be able to return demonstrate and/or verbalize understanding of the post-op home exercise program related to regaining shoulder range of motion.   Time  1    Period  Days    Status  Achieved      Patient will be able to verbalize understanding of the importance of attending the postoperative After Breast Cancer Class for further lymphedema risk reduction education and therapeutic exercise.   Time  1    Period  Days    Status  Achieved       Long Term Clinic Goals - 09/02/17 1504      CC Long Term Goal  #1   Title  Patient will perform home exercise program correctly  and independently.    Time  8    Period  Weeks    Status  New      CC Long Term Goal  #2   Title  Patient will demonstrate right shoulder active flexion at >/= 130 degrees for increased  ease reaching overhead.    Baseline  111 degrees today    Time  8    Period  Weeks    Status  New      CC Long Term Goal  #3   Title  Patient will demonstrate right shoulder active abduction at >/= 137 degrees for increased ease reaching overhead.    Baseline  125 degrees today    Time  8    Period  Weeks    Status  New      CC Long Term Goal  #4   Title  Patient will report she is able to participate in a regular walking program for 30 minutes, 5-6 days per week for reducing recurrence risk and better tolerance of chemotherapy.    Baseline  Unable to walk long distance with boot on.    Time  8    Period  Weeks    Status  New      CC Long Term Goal  #5   Title  Patient will verbalize understanding of risk reduction practices related to lymphedema.    Time  8    Period  Weeks    Status  New      CC Long Term Goal  #6   Title  Quick DASH score reduced to </= 4 for improved function of right arm.    Time  8    Period  Weeks    Status  New      Additional Goals   Additional Goals  Yes         Plan - 09/11/17 1107    Clinical Impression Statement  Pt came in not having to wear her boot any longer and reports other than intermittent soreness at the metatarsal she has been doing well. She was pleased with exercise today reporting noting benefit of them immediately. Did instruct pt to hold off on them though for a few days as she gets her port in tomorrow and pt verbalized understanding this.     Rehab Potential  Excellent    Clinical Impairments Affecting Rehab Potential  Left foot fracture but no longer wearig boot; starts chemo 09/16/17    PT Frequency  1x / week    PT Duration  8 weeks    PT Treatment/Interventions  Therapeutic exercise;Patient/family education;Balance  training;Neuromuscular re-education;Gait training;ADLs/Self Care Home Management;Therapeutic activities;Functional mobility training;Manual techniques;Passive range of motion;Scar mobilization    PT Next Visit Plan  Cont Nustep and review HEP; cont Rt shoulder ROM and LE strength; assess how pt tolerated first chemo and treat accordingly if fatigued     PT Home Exercise Plan  Seated LE strngth and supine dowel exercises    Consulted and Agree with Plan of Care  Patient       Patient will benefit from skilled therapeutic intervention in order to improve the following deficits and impairments:  Postural dysfunction, Decreased knowledge of precautions, Pain, Impaired UE functional use, Decreased range of motion, Difficulty walking, Decreased activity tolerance, Decreased strength  Visit Diagnosis: Muscle weakness (generalized)  Abnormal posture  Stiffness of right shoulder, not elsewhere classified  Difficulty in walking, not elsewhere classified     Problem List Patient Active Problem List   Diagnosis Date Noted  . Breast cancer metastasized to multiple sites, right ([place]) 08/06/2017  . Genetic testing 06/27/2017  . Family history of breast cancer   . Breast CA ([place])   . Malignant neoplasm of upper-inner quadrant of right breast in female, estrogen  receptor positive ([place]) 06/26/2017    Otelia Limes, PTA 09/11/2017, 11:10 AM  [place], [place], [address] Phone: [phone]   Fax:  [phone]  Name: Gloria Ware MRN: [id number] Date of Birth: [birth year]

## 2017-09-12 ENCOUNTER — Ambulatory Visit (HOSPITAL_COMMUNITY): Payer: Medicare Other

## 2017-09-12 ENCOUNTER — Encounter (HOSPITAL_COMMUNITY)
Admission: RE | Disposition: A | Discharge status: Home / Self Care | Payer: Self-pay | Source: Ambulatory Visit | Attending: General Surgery

## 2017-09-12 ENCOUNTER — Encounter (HOSPITAL_COMMUNITY): Payer: Self-pay | Admitting: Certified Registered Nurse Anesthetist

## 2017-09-12 ENCOUNTER — Ambulatory Visit (HOSPITAL_COMMUNITY): Payer: Medicare Other | Admitting: Certified Registered Nurse Anesthetist

## 2017-09-12 ENCOUNTER — Ambulatory Visit (HOSPITAL_COMMUNITY)
Admission: RE | Admit: 2017-09-12 | Discharge: 2017-09-12 | Disposition: A | Discharge status: Home / Self Care | Payer: Medicare Other | Source: Ambulatory Visit | Attending: General Surgery | Admitting: General Surgery

## 2017-09-12 ENCOUNTER — Other Ambulatory Visit: Payer: Self-pay

## 2017-09-12 DIAGNOSIS — F329 Major depressive disorder, single episode, unspecified: Secondary | ICD-10-CM | POA: Insufficient documentation

## 2017-09-12 DIAGNOSIS — Z853 Personal history of malignant neoplasm of breast: Secondary | ICD-10-CM | POA: Diagnosis not present

## 2017-09-12 DIAGNOSIS — C50911 Malignant neoplasm of unspecified site of right female breast: Secondary | ICD-10-CM | POA: Diagnosis not present

## 2017-09-12 DIAGNOSIS — Z880 Allergy status to penicillin: Secondary | ICD-10-CM | POA: Diagnosis not present

## 2017-09-12 DIAGNOSIS — Z79899 Other long term (current) drug therapy: Secondary | ICD-10-CM | POA: Diagnosis not present

## 2017-09-12 DIAGNOSIS — C799 Secondary malignant neoplasm of unspecified site: Secondary | ICD-10-CM | POA: Diagnosis not present

## 2017-09-12 DIAGNOSIS — C50211 Malignant neoplasm of upper-inner quadrant of right female breast: Secondary | ICD-10-CM | POA: Diagnosis not present

## 2017-09-12 DIAGNOSIS — F419 Anxiety disorder, unspecified: Secondary | ICD-10-CM | POA: Diagnosis not present

## 2017-09-12 DIAGNOSIS — M8580 Other specified disorders of bone density and structure, unspecified site: Secondary | ICD-10-CM | POA: Diagnosis not present

## 2017-09-12 DIAGNOSIS — Z7982 Long term (current) use of aspirin: Secondary | ICD-10-CM | POA: Diagnosis not present

## 2017-09-12 DIAGNOSIS — Z452 Encounter for adjustment and management of vascular access device: Secondary | ICD-10-CM | POA: Diagnosis not present

## 2017-09-12 HISTORY — PX: PORTACATH PLACEMENT: SHX2246

## 2017-09-12 SURGERY — INSERTION, TUNNELED CENTRAL VENOUS DEVICE, WITH PORT
Anesthesia: General | Site: Neck

## 2017-09-12 MED ORDER — SODIUM CHLORIDE 0.9% FLUSH: 3.0000 mL | Freq: Two times a day (BID) | INTRAVENOUS | Status: DC

## 2017-09-12 MED ORDER — BUPIVACAINE-EPINEPHRINE 0.25% -1:200000 IJ SOLN
INTRAMUSCULAR | Status: DC | PRN
  Administered 2017-09-12: 8 mL

## 2017-09-12 MED ORDER — HEPARIN SOD (PORK) LOCK FLUSH 100 UNIT/ML IV SOLN
INTRAVENOUS | Status: AC
  Filled 2017-09-12: qty 5

## 2017-09-12 MED ORDER — ACETAMINOPHEN 325 MG PO TABS: 650.0000 mg | ORAL_TABLET | ORAL | Status: DC | PRN

## 2017-09-12 MED ORDER — PROPOFOL 10 MG/ML IV BOLUS
INTRAVENOUS | Status: AC
  Filled 2017-09-12: qty 40

## 2017-09-12 MED ORDER — FENTANYL CITRATE (PF) 100 MCG/2ML IJ SOLN
INTRAMUSCULAR | Status: AC
  Filled 2017-09-12: qty 2

## 2017-09-12 MED ORDER — OXYCODONE HCL 5 MG PO TABS: 5.0000 mg | ORAL_TABLET | ORAL | Status: DC | PRN

## 2017-09-12 MED ORDER — MIDAZOLAM HCL 2 MG/2ML IJ SOLN
INTRAMUSCULAR | Status: AC
  Filled 2017-09-12: qty 2

## 2017-09-12 MED ORDER — LIDOCAINE 2% (20 MG/ML) 5 ML SYRINGE
INTRAMUSCULAR | Status: DC | PRN
  Administered 2017-09-12: 100 mg via INTRAVENOUS

## 2017-09-12 MED ORDER — HEPARIN SODIUM (PORCINE) 5000 UNIT/ML IJ SOLN
Freq: Once | INTRAMUSCULAR | Status: AC
  Administered 2017-09-12: 500 mL
  Filled 2017-09-12: qty 1.2

## 2017-09-12 MED ORDER — SCOPOLAMINE 1 MG/3DAYS TD PT72: 1.0000 | MEDICATED_PATCH | TRANSDERMAL | Status: DC

## 2017-09-12 MED ORDER — BUPIVACAINE HCL (PF) 0.25 % IJ SOLN
INTRAMUSCULAR | Status: AC
  Filled 2017-09-12: qty 30

## 2017-09-12 MED ORDER — PROMETHAZINE HCL 25 MG/ML IJ SOLN: 6.2500 mg | INTRAMUSCULAR | Status: DC | PRN

## 2017-09-12 MED ORDER — CIPROFLOXACIN IN D5W 400 MG/200ML IV SOLN
400.0000 mg | INTRAVENOUS | Status: AC
  Administered 2017-09-12: 400 mg via INTRAVENOUS

## 2017-09-12 MED ORDER — SODIUM CHLORIDE 0.9% FLUSH: 3.0000 mL | INTRAVENOUS | Status: DC | PRN

## 2017-09-12 MED ORDER — CHLORHEXIDINE GLUCONATE CLOTH 2 % EX PADS: 6.0000 | MEDICATED_PAD | Freq: Once | CUTANEOUS | Status: DC

## 2017-09-12 MED ORDER — FENTANYL CITRATE (PF) 100 MCG/2ML IJ SOLN
INTRAMUSCULAR | Status: DC | PRN
  Administered 2017-09-12 (×2): 25 ug via INTRAVENOUS
  Administered 2017-09-12: 50 ug via INTRAVENOUS

## 2017-09-12 MED ORDER — PHENYLEPHRINE 40 MCG/ML (10ML) SYRINGE FOR IV PUSH (FOR BLOOD PRESSURE SUPPORT)
PREFILLED_SYRINGE | INTRAVENOUS | Status: DC | PRN
  Administered 2017-09-12: 80 ug via INTRAVENOUS
  Administered 2017-09-12: 40 ug via INTRAVENOUS
  Administered 2017-09-12: 80 ug via INTRAVENOUS

## 2017-09-12 MED ORDER — EPHEDRINE SULFATE-NACL 50-0.9 MG/10ML-% IV SOSY
PREFILLED_SYRINGE | INTRAVENOUS | Status: DC | PRN
  Administered 2017-09-12: 5 mg via INTRAVENOUS

## 2017-09-12 MED ORDER — DEXAMETHASONE SODIUM PHOSPHATE 4 MG/ML IJ SOLN
INTRAMUSCULAR | Status: DC | PRN
  Administered 2017-09-12: 5 mg via INTRAVENOUS

## 2017-09-12 MED ORDER — MIDAZOLAM HCL 2 MG/2ML IJ SOLN
INTRAMUSCULAR | Status: DC | PRN
  Administered 2017-09-12: 1 mg via INTRAVENOUS

## 2017-09-12 MED ORDER — CIPROFLOXACIN IN D5W 400 MG/200ML IV SOLN
INTRAVENOUS | Status: AC
  Filled 2017-09-12: qty 200

## 2017-09-12 MED ORDER — LACTATED RINGERS IV SOLN
INTRAVENOUS | Status: DC | PRN
  Administered 2017-09-12: 08:00:00 via INTRAVENOUS

## 2017-09-12 MED ORDER — LIDOCAINE HCL (PF) 1 % IJ SOLN
INTRAMUSCULAR | Status: AC
  Filled 2017-09-12: qty 30

## 2017-09-12 MED ORDER — DIPHENHYDRAMINE HCL 50 MG/ML IJ SOLN
INTRAMUSCULAR | Status: AC
  Filled 2017-09-12: qty 1

## 2017-09-12 MED ORDER — HEPARIN SOD (PORK) LOCK FLUSH 100 UNIT/ML IV SOLN
INTRAVENOUS | Status: DC | PRN
  Administered 2017-09-12: 500 [IU] via INTRAVENOUS

## 2017-09-12 MED ORDER — ONDANSETRON HCL 4 MG/2ML IJ SOLN
INTRAMUSCULAR | Status: AC
  Filled 2017-09-12: qty 2

## 2017-09-12 MED ORDER — ONDANSETRON HCL 4 MG/2ML IJ SOLN
INTRAMUSCULAR | Status: DC | PRN
Start: 2017-09-12 — End: 2017-09-12
  Administered 2017-09-12: 4 mg via INTRAVENOUS

## 2017-09-12 MED ORDER — BUPIVACAINE-EPINEPHRINE (PF) 0.25% -1:200000 IJ SOLN
INTRAMUSCULAR | Status: AC
  Filled 2017-09-12: qty 30

## 2017-09-12 MED ORDER — DEXAMETHASONE SODIUM PHOSPHATE 10 MG/ML IJ SOLN
INTRAMUSCULAR | Status: AC
  Filled 2017-09-12: qty 1

## 2017-09-12 MED ORDER — LIDOCAINE-EPINEPHRINE 1 %-1:100000 IJ SOLN
INTRAMUSCULAR | Status: AC
  Filled 2017-09-12: qty 1

## 2017-09-12 MED ORDER — ACETAMINOPHEN 650 MG RE SUPP: 650.0000 mg | RECTAL | Status: DC | PRN

## 2017-09-12 MED ORDER — SCOPOLAMINE 1 MG/3DAYS TD PT72
MEDICATED_PATCH | TRANSDERMAL | Status: AC
  Filled 2017-09-12: qty 1

## 2017-09-12 MED ORDER — PROPOFOL 10 MG/ML IV BOLUS
INTRAVENOUS | Status: DC | PRN
  Administered 2017-09-12: 150 mg via INTRAVENOUS

## 2017-09-12 MED ORDER — LIDOCAINE HCL 1 % IJ SOLN
INTRAMUSCULAR | Status: DC | PRN
  Administered 2017-09-12: 8 mL

## 2017-09-12 MED ORDER — DIPHENHYDRAMINE HCL 50 MG/ML IJ SOLN
INTRAMUSCULAR | Status: DC | PRN
  Administered 2017-09-12: 12.5 mg via INTRAVENOUS

## 2017-09-12 MED ORDER — PHENYLEPHRINE 40 MCG/ML (10ML) SYRINGE FOR IV PUSH (FOR BLOOD PRESSURE SUPPORT)
PREFILLED_SYRINGE | INTRAVENOUS | Status: AC
  Filled 2017-09-12: qty 10

## 2017-09-12 MED ORDER — SODIUM CHLORIDE 0.9 % IV SOLN: 250.0000 mL | INTRAVENOUS | Status: DC | PRN

## 2017-09-12 MED ORDER — EPHEDRINE 5 MG/ML INJ
INTRAVENOUS | Status: AC
  Filled 2017-09-12: qty 10

## 2017-09-12 MED ORDER — LACTATED RINGERS IV SOLN
INTRAVENOUS | Status: DC
  Administered 2017-09-12: 10:00:00 via INTRAVENOUS

## 2017-09-12 MED ORDER — FENTANYL CITRATE (PF) 100 MCG/2ML IJ SOLN: 25.0000 ug | INTRAMUSCULAR | Status: DC | PRN

## 2017-09-12 SURGICAL SUPPLY — 34 items
ADH SKN CLS APL DERMABOND .7 (GAUZE/BANDAGES/DRESSINGS) ×1
BAG DECANTER FOR FLEXI CONT (MISCELLANEOUS) ×2 IMPLANT
BLADE HEX COATED 2.75 (ELECTRODE) ×2 IMPLANT
BLADE SURG 15 STRL LF DISP TIS (BLADE) ×1 IMPLANT
BLADE SURG 15 STRL SS (BLADE) ×2
BLADE SURG SZ11 CARB STEEL (BLADE) ×2 IMPLANT
CHLORAPREP W/TINT 26ML (MISCELLANEOUS) ×2 IMPLANT
COVER SURGICAL LIGHT HANDLE (MISCELLANEOUS) ×2 IMPLANT
DECANTER SPIKE VIAL GLASS SM (MISCELLANEOUS) ×2 IMPLANT
DERMABOND ADVANCED (GAUZE/BANDAGES/DRESSINGS) ×1
DERMABOND ADVANCED .7 DNX12 (GAUZE/BANDAGES/DRESSINGS) ×1 IMPLANT
DRAPE C-ARM 42X120 X-RAY (DRAPES) ×2 IMPLANT
DRAPE LAPAROTOMY TRNSV 102X78 (DRAPE) ×2 IMPLANT
DRAPE UTILITY XL STRL (DRAPES) ×2 IMPLANT
ELECT PENCIL ROCKER SW 15FT (MISCELLANEOUS) ×2 IMPLANT
ELECT REM PT RETURN 15FT ADLT (MISCELLANEOUS) ×2 IMPLANT
GAUZE SPONGE 4X4 16PLY XRAY LF (GAUZE/BANDAGES/DRESSINGS) ×2 IMPLANT
GLOVE BIO SURGEON STRL SZ 6 (GLOVE) ×2 IMPLANT
GLOVE INDICATOR 6.5 STRL GRN (GLOVE) ×2 IMPLANT
GOWN STRL REUS W/TWL 2XL LVL3 (GOWN DISPOSABLE) ×2 IMPLANT
GOWN STRL REUS W/TWL XL LVL3 (GOWN DISPOSABLE) ×2 IMPLANT
KIT BASIN OR (CUSTOM PROCEDURE TRAY) ×2 IMPLANT
KIT PORT POWER 8FR ISP CVUE (Miscellaneous) ×2 IMPLANT
NEEDLE HYPO 22GX1.5 SAFETY (NEEDLE) ×2 IMPLANT
PACK BASIC VI WITH GOWN DISP (CUSTOM PROCEDURE TRAY) ×2 IMPLANT
SUT MNCRL AB 4-0 PS2 18 (SUTURE) ×2 IMPLANT
SUT PROLENE 2 0 SH DA (SUTURE) ×4 IMPLANT
SUT VIC AB 3-0 SH 27 (SUTURE) ×2
SUT VIC AB 3-0 SH 27X BRD (SUTURE) ×1 IMPLANT
SYR 10ML LL (SYRINGE) ×2 IMPLANT
SYR CONTROL 10ML LL (SYRINGE) ×2 IMPLANT
TOWEL OR 17X26 10 PK STRL BLUE (TOWEL DISPOSABLE) ×2 IMPLANT
TOWEL OR NON WOVEN STRL DISP B (DISPOSABLE) ×2 IMPLANT
YANKAUER SUCT BULB TIP 10FT TU (MISCELLANEOUS) IMPLANT

## 2017-09-12 NOTE — H&P (Signed)
Gloria Ware is an 71 y.o. female.   Chief Complaint: right breast cancer HPI:  Pt is a 71 yo F s/p right mastectomy with SLN bx.  High mammaprint, so chemo recommended.    Past Medical History:  Diagnosis Date  . Anxiety   . Breast CA ([place])   . Colon polyp 09/12/2005  . Depression   . Eczema    right forearm   . Family history of breast cancer   . Genetic testing 06/27/2017   Multi-Cancer panel (83 genes) @ [facility] - No pathogenic mutations detected  . Hearing loss   . High cholesterol   . Injury     left foot metatarsal fracture sustined 08-03-17 , treated with boot; patient  reports  improvement as of today  . Osteopenia 11/2016   T score -2.1 stable from prior DEXA  . PONV (postoperative nausea and vomiting)    post op nausea  . Tinnitus     Past Surgical History:  Procedure Laterality Date  . CARDIAC CATHETERIZATION     husband was having symtpoms and needed a stent; subsequently she states she started " to feel something"  but thinks it was " all in my head bc of my hbsand". subsequently saw same cardiologist as her husband who sent for treadmill stress that had inconcluisicve results so she was sent for cardiac catherization which she states was " definitely negative "   . cataract surg    . DILATION AND CURETTAGE OF UTERUS    . MASTECTOMY W/ SENTINEL NODE BIOPSY Right 08/06/2017   Procedure: RIGHT MASTECTOMY WITH SENTINEL LYMPH NODE BIOPSY;  Surgeon: [name], MD;  Location: [place];  Service: General;  Laterality: Right;  . MOUTH SURGERY    . RADIOACTIVE SEED GUIDED AXILLARY SENTINEL LYMPH NODE Right 08/06/2017   Procedure: SEED TARGETED AXILLARY LYMPH NODE EXCISION;  Surgeon: [name], MD;  Location: [place];  Service: General;  Laterality: Right;    Family History  Problem Relation Age of Onset  . Breast cancer Sister 75       currently 78  . Hypertension Maternal Grandmother   . Stroke Maternal Grandmother   .  Hypertension Maternal Grandfather   . Stroke Maternal Grandfather   . Breast cancer Cousin        unconfirmed in maternal first cousin  . Ovarian cancer Cousin        unconfirmed if ovarian vs. uterine in paternal first cousin   Social History:  reports that  has never smoked. she has never used smokeless tobacco. She reports that she does not drink alcohol or use drugs.  Allergies:  Allergies  Allergen Reactions  . Penicillins Other (See Comments)    Throat burning Has patient had a PCN reaction causing immediate rash, facial/tongue/throat swelling, SOB or lightheadedness with hypotension: No Has patient had a PCN reaction causing severe rash involving mucus membranes or skin necrosis: No Has patient had a PCN reaction that required hospitalization: No Has patient had a PCN reaction occurring within the last 10 years: No If all of the above answers are "NO", then may proceed with Cephalosporin use.   . Sulfa Antibiotics Nausea And Vomiting  . Atorvastatin      Muscle aches   . Zetia [Ezetimibe] Other (See Comments)    Muscle aches     Medications Prior to Admission  Medication Sig Dispense Refill  . acetaminophen (TYLENOL) 500 MG tablet Take 500-1,000 mg by mouth every 6 (  six) hours as needed for moderate pain or headache.     . anastrozole (ARIMIDEX) 1 MG tablet Take 1 tablet (1 mg total) by mouth daily. (Patient taking differently: Take 1 mg by mouth at bedtime. ) 90 tablet 3  . aspirin 81 MG tablet Take 81 mg by mouth daily.      . Calcium Carbonate-Vitamin D (CALTRATE 600+D PO) Take 1 tablet by mouth 2 (two) times daily.    . sertraline (ZOLOFT) 100 MG tablet Take 200 mg by mouth daily.     Marland Kitchen lidocaine-prilocaine (EMLA) cream Apply to affected area once 30 g 3  . loratadine (CLARITIN) 10 MG tablet Take 10 mg by mouth as needed for allergies.    Marland Kitchen LORazepam (ATIVAN) 0.5 MG tablet Take 1 tablet (0.5 mg total) by mouth at bedtime as needed (Nausea or vomiting). 30 tablet 0  .  methocarbamol (ROBAXIN) 500 MG tablet Take 1 tablet (500 mg total) by mouth every 6 (six) hours as needed for muscle spasms. (Patient not taking: Reported on 09/04/2017) 20 tablet 1  . ondansetron (ZOFRAN) 8 MG tablet Take 1 tablet (8 mg total) by mouth 2 (two) times daily as needed. Start on the third day after chemotherapy. 30 tablet 1  . oxyCODONE (OXY IR/ROXICODONE) 5 MG immediate release tablet Take 1-2 tablets (5-10 mg total) by mouth every 4 (four) hours as needed for moderate pain. (Patient not taking: Reported on 09/04/2017) 20 tablet 0  . prochlorperazine (COMPAZINE) 10 MG tablet Take 1 tablet (10 mg total) by mouth every 6 (six) hours as needed (Nausea or vomiting). 30 tablet 1    Results for orders placed or performed during the hospital encounter of 09/10/17 (from the past 48 hour(s))  Basic metabolic panel     Status: None   Collection Time: 09/10/17 11:22 AM  Result Value Ref Range   Sodium 137 135 - 145 mmol/L   Potassium 4.1 3.5 - 5.1 mmol/L   Chloride 103 101 - 111 mmol/L   CO2 26 22 - 32 mmol/L   Glucose, Bld 96 65 - 99 mg/dL   BUN 17 6 - 20 mg/dL   Creatinine, Ser 0.83 0.44 - 1.00 mg/dL   Calcium 9.5 8.9 - 10.3 mg/dL   GFR calc non Af Amer >60 >60 mL/min   GFR calc Af Amer >60 >60 mL/min    Comment: (NOTE) The eGFR has been calculated using the CKD EPI equation. This calculation has not been validated in all clinical situations. eGFR's persistently <60 mL/min signify possible Chronic Kidney Disease.    Anion gap 8 5 - 15  CBC     Status: None   Collection Time: 09/10/17 11:22 AM  Result Value Ref Range   WBC 5.4 4.0 - 10.5 K/uL   RBC 4.20 3.87 - 5.11 MIL/uL   Hemoglobin 12.2 12.0 - 15.0 g/dL   HCT 37.4 36.0 - 46.0 %   MCV 89.0 78.0 - 100.0 fL   MCH 29.0 26.0 - 34.0 pg   MCHC 32.6 30.0 - 36.0 g/dL   RDW 13.7 11.5 - 15.5 %   Platelets 225 150 - 400 K/uL   No results found.  Review of Systems  All other systems reviewed and are negative.   Blood pressure  121/66, pulse 67, temperature 97.9 F (36.6 C), temperature source Oral, resp. rate 14, height '5\' 1"'$  (1.549 m), weight 67.6 kg (149 lb), last menstrual period 07/30/1998, SpO2 95 %. Physical Exam  Constitutional: She is oriented to  person, place, and time. She appears well-developed and well-nourished. No distress.  HENT:  Head: Normocephalic and atraumatic.  Eyes: Conjunctivae are normal. Pupils are equal, round, and reactive to light.  Neck: Neck supple.  Cardiovascular: Normal rate.  Respiratory: Effort normal. No respiratory distress.  Musculoskeletal: Normal range of motion. She exhibits no edema or tenderness.  Neurological: She is alert and oriented to person, place, and time.  Skin: Skin is warm and dry. She is not diaphoretic.  Psychiatric: She has a normal mood and affect. Her behavior is normal. Judgment and thought content normal.     Assessment/Plan Right breast cancer. Plan port placement. Discussed.    [name], MD 09/12/2017, 7:27 AM

## 2017-09-12 NOTE — Discharge Instructions (Addendum)
[place] Office Phone Number [phone]   POST OP INSTRUCTIONS  Always review your discharge instruction sheet given to you by the facility where your surgery was performed.  IF YOU HAVE DISABILITY OR FAMILY LEAVE FORMS, YOU MUST BRING THEM TO THE OFFICE FOR PROCESSING.  DO NOT GIVE THEM TO YOUR DOCTOR.  1. A prescription for pain medication may be given to you upon discharge.  Take your pain medication as prescribed, if needed.  If narcotic pain medicine is not needed, then you may take acetaminophen (Tylenol) or ibuprofen (Advil) as needed. 2. Take your usually prescribed medications unless otherwise directed 3. If you need a refill on your pain medication, please contact your pharmacy.  They will contact our office to request authorization.  Prescriptions will not be filled after 5pm or on week-ends. 4. You should eat very light the first 24 hours after surgery, such as soup, crackers, pudding, etc.  Resume your normal diet the day after surgery 5. It is common to experience some constipation if taking pain medication after surgery.  Increasing fluid intake and taking a stool softener will usually help or prevent this problem from occurring.  A mild laxative (Milk of Magnesia or Miralax) should be taken according to package directions if there are no bowel movements after 48 hours. 6. You may shower in 48 hours.  The surgical glue will flake off in 2-3 weeks.   7. ACTIVITIES:  No strenuous activity or heavy lifting for 1 week.   a. You may drive when you no longer are taking prescription pain medication, you can comfortably wear a seatbelt, and you can safely maneuver your car and apply brakes. b. RETURN TO WORK:  __________to be determined._______________ Dennis Bast should see your doctor in the office for a follow-up appointment approximately three-four weeks after your surgery.    WHEN TO CALL YOUR DOCTOR: 1. Fever over 101.0 2. Nausea and/or vomiting. 3. Extreme swelling  or bruising. 4. Continued bleeding from incision. 5. Increased pain, redness, or drainage from the incision.  The clinic staff is available to answer your questions during regular business hours.  Please dont hesitate to call and ask to speak to one of the nurses for clinical concerns.  If you have a medical emergency, go to the nearest emergency room or call 911.  A surgeon from [facility] is always on call at the hospital.  For further questions, please visit [email or web address]    General Anesthesia, Adult, Care After These instructions provide you with information about caring for yourself after your procedure. Your health care provider may also give you more specific instructions. Your treatment has been planned according to current medical practices, but problems sometimes occur. Call your health care provider if you have any problems or questions after your procedure. What can I expect after the procedure? After the procedure, it is common to have:  Vomiting.  A sore throat.  Mental slowness.  It is common to feel:  Nauseous.  Cold or shivery.  Sleepy.  Tired.  Sore or achy, even in parts of your body where you did not have surgery.  Follow these instructions at home: For at least 24 hours after the procedure:  Do not: ? Participate in activities where you could fall or become injured. ? Drive. ? Use heavy machinery. ? Drink alcohol. ? Take sleeping pills or medicines that cause drowsiness. ? Make important decisions or sign legal documents. ? Take care of children on your own.  Rest. Eating  and drinking  If you vomit, drink water, juice, or soup when you can drink without vomiting.  Drink enough fluid to keep your urine clear or pale yellow.  Make sure you have little or no nausea before eating solid foods.  Follow the diet recommended by your health care provider. General instructions  Have a responsible adult stay with you until  you are awake and alert.  Return to your normal activities as told by your health care provider. Ask your health care provider what activities are safe for you.  Take over-the-counter and prescription medicines only as told by your health care provider.  If you smoke, do not smoke without supervision.  Keep all follow-up visits as told by your health care provider. This is important. Contact a health care provider if:  You continue to have nausea or vomiting at home, and medicines are not helpful.  You cannot drink fluids or start eating again.  You cannot urinate after 8-12 hours.  You develop a skin rash.  You have fever.  You have increasing redness at the site of your procedure. Get help right away if:  You have difficulty breathing.  You have chest pain.  You have unexpected bleeding.  You feel that you are having a life-threatening or urgent problem. This information is not intended to replace advice given to you by your health care provider. Make sure you discuss any questions you have with your health care provider. Document Released: 12/31/2000 Document Revised: 02/27/2016 Document Reviewed: 09/08/2015 Elsevier Interactive Patient Education  [employer].

## 2017-09-12 NOTE — Op Note (Signed)
PREOPERATIVE DIAGNOSIS:  Right breast cancer     POSTOPERATIVE DIAGNOSIS:  Same     PROCEDURE: Left subclavian port placement, Bard ClearVue  Power Port, MRI safe, 8-French.      SURGEON:  [name], MD      ANESTHESIA:  General   FINDINGS:  Good venous return, easy flush, and tip of the catheter and   SVC 20.5 cm.      SPECIMEN:  None.      ESTIMATED BLOOD LOSS:  Minimal.      COMPLICATIONS:  None known.      PROCEDURE:  Pt was identified in the holding area and taken to   the operating room, where patient was placed supine on the operating room   table.  General anesthesia was induced.  Patient's arms were tucked and the upper   chest and neck were prepped and draped in sterile fashion.  Time-out was   performed according to the surgical safety check list.  When all was   correct, we continued.   Local anesthetic was administered over this   area at the angle of the clavicle.  The vein was accessed with 1 pass(es) of the needle. There was good venous return and the wire passed easily with no ectopy.   Fluoroscopy was used to confirm that the wire was in the vena cava.      The patient was placed back level and the area for the pocket was anethetized   with local anesthetic.  A 3-cm transverse incision was made with a #15   blade.  Cautery was used to divide the subcutaneous tissues down to the   pectoralis muscle.  An [employer] retractor was used to elevate the skin   while a pocket was created on top of the pectoralis fascia.  The port   was placed into the pocket to confirm that it was of adequate size.  The   catheter was preattached to the port.  The port was then secured to the   pectoralis fascia with four 2-0 Prolene sutures.  These were clamped and   not tied down yet.    The catheter was tunneled through to the wire exit   site.  The catheter was placed along the wire to determine what length it should be to be in the SVC.  The catheter was cut at 20.5 cm.  The  tunneler sheath and dilator were passed over the wire and the dilator and wire were removed.  The catheter was advanced through the tunneler sheath and the tunneler sheath was pulled away.  Care was taken to keep the catheter in the tunneler sheath as this occurred. This was advanced and the tunneler sheath was removed.  There was good venous   return and easy flush of the catheter.  The Prolene sutures were tied   down to the pectoral fascia.  The skin was reapproximated using 3-0   Vicryl interrupted deep dermal sutures.    Fluoroscopy was used to re-confirm good position of the catheter.  The skin   was then closed using 4-0 Monocryl in a subcuticular fashion.  The port was flushed with concentrated heparin flush as well.  The wounds were then cleaned, dried, and dressed with Dermabond.  The patient was awakened from anesthesia and taken to the PACU in stable condition.  Needle, sponge, and instrument counts were correct.               [name], MD

## 2017-09-12 NOTE — Progress Notes (Signed)
Portable Upright Chest X-ray done. 

## 2017-09-12 NOTE — Anesthesia Procedure Notes (Signed)
Procedure Name: LMA Insertion Date/Time: 09/12/2017 7:48 AM Performed by: Claudia Desanctis, CRNA Pre-anesthesia Checklist: Emergency Drugs available, Patient identified, Suction available and Patient being monitored Patient Re-evaluated:Patient Re-evaluated prior to induction Oxygen Delivery Method: Circle system utilized Preoxygenation: Pre-oxygenation with 100% oxygen Induction Type: IV induction Ventilation: Mask ventilation without difficulty LMA: LMA inserted LMA Size: 4.0 Number of attempts: 1 Placement Confirmation: positive ETCO2 and breath sounds checked- equal and bilateral Tube secured with: Tape Dental Injury: Teeth and Oropharynx as per pre-operative assessment

## 2017-09-12 NOTE — Transfer of Care (Signed)
Immediate Anesthesia Transfer of Care Note  Patient: Gloria Ware  Procedure(s) Performed: INSERTION PORT-A-CATH (N/A Neck)  Patient Location: PACU  Anesthesia Type:General  Level of Consciousness: drowsy  Airway & Oxygen Therapy: Patient Spontanous Breathing and Patient connected to face mask  Post-op Assessment: Report given to RN and Post -op Vital signs reviewed and stable  Post vital signs: Reviewed and stable  Last Vitals:  Vitals:   09/12/17 0654  BP: 121/66  Pulse: 67  Resp: 14  Temp: 36.6 C  SpO2: 95%    Last Pain:  Vitals:   09/12/17 0654  TempSrc: Oral         Complications: No apparent anesthesia complications

## 2017-09-12 NOTE — Anesthesia Postprocedure Evaluation (Signed)
Anesthesia Post Note  Patient: Gloria Ware  Procedure(s) Performed: INSERTION PORT-A-CATH (N/A Neck)     Patient location during evaluation: PACU Anesthesia Type: General Level of consciousness: sedated Pain management: pain level controlled Vital Signs Assessment: post-procedure vital signs reviewed and stable Respiratory status: spontaneous breathing and respiratory function stable Cardiovascular status: stable Postop Assessment: no apparent nausea or vomiting Anesthetic complications: no    Last Vitals:  Vitals:   09/12/17 0900 09/12/17 0945  BP: (!) 143/68 (!) 113/56  Pulse: (!) 103 81  Resp: 17 13  Temp:  36.8 C  SpO2: 100% 95%    Last Pain:  Vitals:   09/12/17 0945  TempSrc:   PainSc: 0-No pain                 Estephania Licciardi DANIEL

## 2017-09-13 ENCOUNTER — Telehealth: Payer: Self-pay

## 2017-09-13 NOTE — Telephone Encounter (Signed)
Returned pt VM regarding questions with 1st time infusion on Monday. Informed pt correct use of EMLA cream and when to apply. Also to stop her anastrozole and continue her other medications she currently takes. Explained to her she will restart antiestrogen after chemo is complete. She had concerns with weather coming Monday. I explained to her we stay open and if she feels it is unsafe to travel then to call Monday morning and we will reschedule.  Cyndia Bent RN

## 2017-09-15 ENCOUNTER — Telehealth: Payer: Self-pay | Admitting: *Deleted

## 2017-09-15 NOTE — Telephone Encounter (Signed)
Attempted to call patient to let patient know that the [facility] is closed tomorrow 09/16/2017 and that someone will call to reschedule. Left voicemail with information.

## 2017-09-16 ENCOUNTER — Ambulatory Visit: Payer: Medicare Other

## 2017-09-16 ENCOUNTER — Ambulatory Visit: Payer: Medicare Other | Admitting: Hematology and Oncology

## 2017-09-16 ENCOUNTER — Other Ambulatory Visit: Payer: Medicare Other

## 2017-09-17 ENCOUNTER — Ambulatory Visit (HOSPITAL_BASED_OUTPATIENT_CLINIC_OR_DEPARTMENT_OTHER): Payer: Medicare Other | Admitting: Hematology and Oncology

## 2017-09-17 ENCOUNTER — Other Ambulatory Visit (HOSPITAL_BASED_OUTPATIENT_CLINIC_OR_DEPARTMENT_OTHER): Payer: Medicare Other

## 2017-09-17 ENCOUNTER — Encounter: Payer: Self-pay | Admitting: Physical Therapy

## 2017-09-17 ENCOUNTER — Telehealth: Payer: Self-pay | Admitting: *Deleted

## 2017-09-17 ENCOUNTER — Ambulatory Visit (HOSPITAL_BASED_OUTPATIENT_CLINIC_OR_DEPARTMENT_OTHER): Payer: Medicare Other

## 2017-09-17 VITALS — BP 105/62 | HR 80 | Temp 98.3°F | Resp 18 | Wt 147.5 lb

## 2017-09-17 DIAGNOSIS — C773 Secondary and unspecified malignant neoplasm of axilla and upper limb lymph nodes: Secondary | ICD-10-CM

## 2017-09-17 DIAGNOSIS — C50211 Malignant neoplasm of upper-inner quadrant of right female breast: Secondary | ICD-10-CM | POA: Diagnosis not present

## 2017-09-17 DIAGNOSIS — Z170 Estrogen receptor positive status [ER+]: Secondary | ICD-10-CM | POA: Diagnosis not present

## 2017-09-17 DIAGNOSIS — Z5111 Encounter for antineoplastic chemotherapy: Secondary | ICD-10-CM | POA: Diagnosis not present

## 2017-09-17 LAB — CBC WITH DIFFERENTIAL/PLATELET
BASO%: 0.4 % (ref 0.0–2.0)
BASOS ABS: 0 10*3/uL (ref 0.0–0.1)
EOS ABS: 0.5 10*3/uL (ref 0.0–0.5)
EOS%: 8.5 % — ABNORMAL HIGH (ref 0.0–7.0)
HEMATOCRIT: 38.2 % (ref 34.8–46.6)
HEMOGLOBIN: 12.7 g/dL (ref 11.6–15.9)
LYMPH#: 1.6 10*3/uL (ref 0.9–3.3)
LYMPH%: 31.1 % (ref 14.0–49.7)
MCH: 29.5 pg (ref 25.1–34.0)
MCHC: 33.2 g/dL (ref 31.5–36.0)
MCV: 88.8 fL (ref 79.5–101.0)
MONO#: 0.5 10*3/uL (ref 0.1–0.9)
MONO%: 8.5 % (ref 0.0–14.0)
NEUT%: 51.5 % (ref 38.4–76.8)
NEUTROS ABS: 2.7 10*3/uL (ref 1.5–6.5)
PLATELETS: 240 10*3/uL (ref 145–400)
RBC: 4.3 10*6/uL (ref 3.70–5.45)
RDW: 13.7 % (ref 11.2–14.5)
WBC: 5.3 10*3/uL (ref 3.9–10.3)

## 2017-09-17 LAB — COMPREHENSIVE METABOLIC PANEL
ALBUMIN: 4 g/dL (ref 3.5–5.0)
ALK PHOS: 107 U/L (ref 40–150)
ALT: 52 U/L (ref 0–55)
ANION GAP: 10 meq/L (ref 3–11)
AST: 36 U/L — ABNORMAL HIGH (ref 5–34)
BILIRUBIN TOTAL: 0.38 mg/dL (ref 0.20–1.20)
BUN: 19.6 mg/dL (ref 7.0–26.0)
CALCIUM: 9.6 mg/dL (ref 8.4–10.4)
CO2: 21 mEq/L — ABNORMAL LOW (ref 22–29)
Chloride: 106 mEq/L (ref 98–109)
Creatinine: 0.9 mg/dL (ref 0.6–1.1)
GLUCOSE: 110 mg/dL (ref 70–140)
Potassium: 3.9 mEq/L (ref 3.5–5.1)
Sodium: 137 mEq/L (ref 136–145)
TOTAL PROTEIN: 7.8 g/dL (ref 6.4–8.3)

## 2017-09-17 MED ORDER — PEGFILGRASTIM 6 MG/0.6ML ~~LOC~~ PSKT
6.0000 mg | PREFILLED_SYRINGE | Freq: Once | SUBCUTANEOUS | Status: AC
  Administered 2017-09-17: 6 mg via SUBCUTANEOUS

## 2017-09-17 MED ORDER — SODIUM CHLORIDE 0.9 % IV SOLN
600.0000 mg/m2 | Freq: Once | INTRAVENOUS | Status: AC
  Administered 2017-09-17: 1020 mg via INTRAVENOUS
  Filled 2017-09-17: qty 51

## 2017-09-17 MED ORDER — HEPARIN SOD (PORK) LOCK FLUSH 100 UNIT/ML IV SOLN
500.0000 [IU] | Freq: Once | INTRAVENOUS | Status: AC | PRN
  Administered 2017-09-17: 500 [IU]
  Filled 2017-09-17: qty 5

## 2017-09-17 MED ORDER — FOSAPREPITANT DIMEGLUMINE INJECTION 150 MG
Freq: Once | INTRAVENOUS | Status: AC
  Administered 2017-09-17: 13:00:00 via INTRAVENOUS
  Filled 2017-09-17: qty 5

## 2017-09-17 MED ORDER — PEGFILGRASTIM 6 MG/0.6ML ~~LOC~~ PSKT
PREFILLED_SYRINGE | SUBCUTANEOUS | Status: AC
  Filled 2017-09-17: qty 0.6

## 2017-09-17 MED ORDER — FAMOTIDINE IN NACL 20-0.9 MG/50ML-% IV SOLN
20.0000 mg | Freq: Once | INTRAVENOUS | Status: AC
  Administered 2017-09-17: 20 mg via INTRAVENOUS

## 2017-09-17 MED ORDER — DOXORUBICIN HCL CHEMO IV INJECTION 2 MG/ML
60.0000 mg/m2 | Freq: Once | INTRAVENOUS | Status: AC
  Administered 2017-09-17: 102 mg via INTRAVENOUS
  Filled 2017-09-17: qty 51

## 2017-09-17 MED ORDER — PALONOSETRON HCL INJECTION 0.25 MG/5ML
INTRAVENOUS | Status: AC
  Filled 2017-09-17: qty 5

## 2017-09-17 MED ORDER — PALONOSETRON HCL INJECTION 0.25 MG/5ML
0.2500 mg | Freq: Once | INTRAVENOUS | Status: AC
  Administered 2017-09-17: 0.25 mg via INTRAVENOUS

## 2017-09-17 MED ORDER — SODIUM CHLORIDE 0.9% FLUSH
10.0000 mL | INTRAVENOUS | Status: DC | PRN
  Administered 2017-09-17: 10 mL
  Filled 2017-09-17: qty 10

## 2017-09-17 MED ORDER — SODIUM CHLORIDE 0.9 % IV SOLN
Freq: Once | INTRAVENOUS | Status: AC
  Administered 2017-09-17: 12:00:00 via INTRAVENOUS

## 2017-09-17 NOTE — Assessment & Plan Note (Signed)
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 I discussed the margins as well as lymph node surgeries. We also discussed the final staging along with previously performed ER/PR and HER-2/neu testing.  Treatment plan: 1.  Mammaprint high risk: Dose since Adriamycin and Cytoxan x4 followed by Taxol weekly x12  2. adjuvant radiation therapy 3.  Followed by adjuvant antiestrogen therapy ---------------------------------------------------------------------Chemotherapy Counseling: I discussed the risks and benefits of chemotherapy including the risks of nausea/ vomiting, risk of infection from low WBC count, fatigue due to chemo or anemia, bruising or bleeding due to low platelets, mouth sores, loss/ change in taste and decreased appetite. Liver and kidney function will be monitored through out chemotherapy as abnormalities in liver and kidney function may be a side effect of treatment. Cardiac dysfunction due to Adriamycin Herceptin and Perjeta were discussed in detail. Risk of permanent bone marrow dysfunction and leukemia due to chemo were also discussed.  UPBEAT clinical trial ([id number]): Newly diagnosed stage I to III breast cancer patients receiving either adjuvant or neoadjuvant chemotherapy undergo cardiac MRI before treatment and at 24 months along with neurocognitive testing, exercise and disability measures at baseline 3, 12 and 24 months.  Return to clinic in 2 weeks to start chemotherapy.

## 2017-09-17 NOTE — Progress Notes (Signed)
1325: Pt complains to leg tightness. Pt denies any other Symptoms. VS stable. Sandi Mealy NP aware and at chair side.  1345: Pt states leg tightness has improved, pt reports she had "same feeling" in abdomen but it is now resolved.  1415: Pt states leg tightness has improved but not resolved. Per Sandi Mealy NP okay to proceed with treatment.  1440: Blood return noted before, every 3 cc and after Adriamycin push.  1540: Pt stable at discharge. Printed discharge and neulasta (along with video) instructions given and verbally reviewed. Pt verbalizes understanding.

## 2017-09-17 NOTE — Progress Notes (Signed)
Patient Care Team: [name], MD as PCP - General (Family Medicine) [name], MD as Consulting Physician (General Surgery) Nicholas Lose, MD as Consulting Physician (Hematology and Oncology) Kyung Rudd, MD as Consulting Physician (Radiation Oncology)  DIAGNOSIS:  Encounter Diagnosis  Name Primary?  . Malignant neoplasm of upper-inner quadrant of right breast in female, estrogen receptor positive ([place])     SUMMARY OF ONCOLOGIC HISTORY:   Malignant neoplasm of upper-inner quadrant of right breast in female, estrogen receptor positive ([place])   06/21/2017 Initial Diagnosis    Screening detected right breast mass and calcifications 1 cm at 1:00 position biopsy of mass and calcifications revealed invasive lobular cancer with pleomorphic features, grade 2, axillary lymph node biopsy positive for cancer, ER/PR positive HER-2 negative with a Ki-67 of 40%, T1b N1 stage IB AJCC 8      08/06/2017 Surgery    Right mastectomy: Scattered foci of invasive lobular cancer, grade 2, 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  She was very anxious because of the delay in chemotherapy from yesterday to today.  REVIEW OF SYSTEMS:   Constitutional: Denies fevers, chills or abnormal weight loss Eyes: Denies blurriness of vision Ears, nose, mouth, throat, and face: Denies mucositis or sore throat Respiratory: Denies cough, dyspnea  or wheezes Cardiovascular: Denies palpitation, chest discomfort Gastrointestinal:  Denies nausea, heartburn or change in bowel habits Skin: Denies abnormal skin rashes Lymphatics: Denies new lymphadenopathy or easy bruising Neurological:Denies numbness, tingling or new weaknesses Behavioral/Psych: Mood is stable, no new changes  Extremities: No lower extremity edema  All other systems were reviewed with the patient and are negative.  I have reviewed the past medical history, past surgical history, social history and family history with the patient and they are unchanged from previous note.  ALLERGIES:  is allergic to penicillins; sulfa antibiotics; atorvastatin; and zetia [ezetimibe].  MEDICATIONS:  Current Outpatient Medications  Medication Sig Dispense Refill  . acetaminophen (TYLENOL) 500 MG tablet Take 500-1,000 mg by mouth every 6 (six) hours as needed for moderate pain or headache.     . anastrozole (ARIMIDEX) 1 MG tablet Take 1 tablet (1 mg total) by mouth daily. (Patient taking differently: Take 1 mg by mouth at bedtime. ) 90 tablet 3  . aspirin 81 MG tablet Take 81 mg by mouth daily.      . Calcium Carbonate-Vitamin D (CALTRATE 600+D PO) Take 1 tablet by mouth 2 (two) times daily.    Marland Kitchen lidocaine-prilocaine (EMLA) cream Apply to affected area once 30 g 3  . loratadine (CLARITIN) 10 MG tablet Take 10 mg by mouth as needed for allergies.    Marland Kitchen LORazepam (ATIVAN) 0.5 MG tablet Take 1 tablet (0.5 mg total) by mouth at bedtime as needed (Nausea or vomiting). 30 tablet 0  . methocarbamol (ROBAXIN) 500 MG tablet Take 1 tablet (500 mg total) by  mouth every 6 (six) hours as needed for muscle spasms. (Patient not taking: Reported on 09/04/2017) 20 tablet 1  . ondansetron (ZOFRAN) 8 MG tablet Take 1 tablet (8 mg total) by mouth 2 (two) times daily as needed. Start on the third day after chemotherapy. 30 tablet 1  . oxyCODONE (OXY IR/ROXICODONE) 5 MG immediate release tablet Take 1-2 tablets  (5-10 mg total) by mouth every 4 (four) hours as needed for moderate pain. (Patient not taking: Reported on 09/04/2017) 20 tablet 0  . prochlorperazine (COMPAZINE) 10 MG tablet Take 1 tablet (10 mg total) by mouth every 6 (six) hours as needed (Nausea or vomiting). 30 tablet 1  . sertraline (ZOLOFT) 100 MG tablet Take 200 mg by mouth daily.      No current facility-administered medications for this visit.    Facility-Administered Medications Ordered in Other Visits  Medication Dose Route Frequency Provider Last Rate Last Dose  . 0.9 %  sodium chloride infusion   Intravenous Once Nicholas Lose, MD      . cyclophosphamide (CYTOXAN) 1,020 mg in sodium chloride 0.9 % 250 mL chemo infusion  600 mg/m2 (Treatment Plan Recorded) Intravenous Once Nicholas Lose, MD      . DOXOrubicin (ADRIAMYCIN) chemo injection 102 mg  60 mg/m2 (Treatment Plan Recorded) Intravenous Once Nicholas Lose, MD      . fosaprepitant (EMEND) 150 mg, dexamethasone (DECADRON) 12 mg in sodium chloride 0.9 % 145 mL IVPB   Intravenous Once Nicholas Lose, MD      . heparin lock flush 100 unit/mL  500 Units Intracatheter Once PRN Nicholas Lose, MD      . palonosetron (ALOXI) injection 0.25 mg  0.25 mg Intravenous Once Nicholas Lose, MD      . pegfilgrastim (NEULASTA ONPRO KIT) injection 6 mg  6 mg Subcutaneous Once Nicholas Lose, MD      . sodium chloride flush (NS) 0.9 % injection 10 mL  10 mL Intracatheter PRN Nicholas Lose, MD        PHYSICAL EXAMINATION: ECOG PERFORMANCE STATUS: 1 - Symptomatic but completely ambulatory  GENERAL:alert, no distress and comfortable SKIN: skin color, texture, turgor are normal, no rashes or significant lesions EYES: normal, Conjunctiva are pink and non-injected, sclera clear OROPHARYNX:no exudate, no erythema and lips, buccal mucosa, and tongue normal  NECK: supple, thyroid normal size, non-tender, without nodularity LYMPH:  no palpable lymphadenopathy in the cervical, axillary or inguinal LUNGS:  clear to auscultation and percussion with normal breathing effort HEART: regular rate & rhythm and no murmurs and no lower extremity edema ABDOMEN:abdomen soft, non-tender and normal bowel sounds MUSCULOSKELETAL:no cyanosis of digits and no clubbing  NEURO: alert & oriented x 3 with fluent speech, no focal motor/sensory deficits EXTREMITIES: No lower extremity edema  LABORATORY DATA:  I have reviewed the data as listed   Chemistry      Component Value Date/Time   NA 137 09/17/2017 1109   K 3.9 09/17/2017 1109   CL 103 09/10/2017 1122   CO2 21 (L) 09/17/2017 1109   BUN 19.6 09/17/2017 1109   CREATININE 0.9 09/17/2017 1109      Component Value Date/Time   CALCIUM 9.6 09/17/2017 1109   ALKPHOS 107 09/17/2017 1109   AST 36 (H) 09/17/2017 1109   ALT 52 09/17/2017 1109   BILITOT 0.38 09/17/2017 1109       Lab Results  Component Value Date   WBC 5.3 09/17/2017   HGB 12.7 09/17/2017   HCT 38.2 09/17/2017   MCV 88.8  09/17/2017   PLT 240 09/17/2017   NEUTROABS 2.7 09/17/2017    ASSESSMENT & PLAN:  Malignant neoplasm of upper-inner quadrant of right breast in female, estrogen receptor positive ([place]) 08/06/2017 right mastectomy: Scattered foci of invasive lobular cancer, grade 2, 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 I discussed the margins as well as lymph node surgeries. We also discussed the final staging along with previously performed ER/PR and HER-2/neu testing.  Treatment plan: 1.  Mammaprint high risk: Dose since Adriamycin and Cytoxan x4 followed by Taxol weekly x12  2. adjuvant radiation therapy 3.  Followed by adjuvant antiestrogen therapy --------------------------------------------------------------------- UPBEAT clinical trial ([id number]): Newly diagnosed stage I to III breast cancer patients receiving either adjuvant or neoadjuvant chemotherapy undergo cardiac MRI before treatment and at 24 months along with neurocognitive testing, exercise and disability measures at baseline 3, 12 and 24 months.  Current treatment: Dose dense Adriamycin and Cytoxan cycle 1 day 1 I reviewed her antiemetic regimen in great detail The patient signed chemo consent  Return to clinic in 1 week for toxicity check.  I spent 25 minutes talking to the patient of which more than half was spent in counseling and coordination of care.  No orders of the defined types were placed in this encounter.  The patient has a good understanding of the overall plan. she agrees with it. she will call with any problems that may develop before the next visit here.   Rulon Eisenmenger, MD 09/17/17

## 2017-09-17 NOTE — Patient Instructions (Addendum)
[facility] Discharge Instructions for Patients Receiving Chemotherapy  Today you received the following chemotherapy agents: Adriamycin, Cytoxan , Neulasta  To help prevent nausea and vomiting after your treatment, we encourage you to take your nausea medication as directed.    If you develop nausea and vomiting that is not controlled by your nausea medication, call the clinic.   BELOW ARE SYMPTOMS THAT SHOULD BE REPORTED IMMEDIATELY:  *FEVER GREATER THAN 100.5 F  *CHILLS WITH OR WITHOUT FEVER  NAUSEA AND VOMITING THAT IS NOT CONTROLLED WITH YOUR NAUSEA MEDICATION  *UNUSUAL SHORTNESS OF BREATH  *UNUSUAL BRUISING OR BLEEDING  TENDERNESS IN MOUTH AND THROAT WITH OR WITHOUT PRESENCE OF ULCERS  *URINARY PROBLEMS  *BOWEL PROBLEMS  UNUSUAL RASH Items with * indicate a potential emergency and should be followed up as soon as possible.  Feel free to call the clinic should you have any questions or concerns. The clinic phone number is [phone].  Please show the [place] at check-in to the Emergency Department and triage nurse.    Doxorubicin injection What is this medicine? DOXORUBICIN (dox oh ROO bi sin) is a chemotherapy drug. It is used to treat many kinds of cancer like leukemia, lymphoma, neuroblastoma, sarcoma, and Wilms' tumor. It is also used to treat bladder cancer, breast cancer, lung cancer, ovarian cancer, stomach cancer, and thyroid cancer. This medicine may be used for other purposes; ask your health care provider or pharmacist if you have questions. COMMON BRAND NAME(S): Adriamycin, Adriamycin PFS, Adriamycin RDF, Rubex What should I tell my health care provider before I take this medicine? They need to know if you have any of these conditions: -heart disease -history of low blood counts caused by a medicine -liver disease -recent or ongoing radiation therapy -an unusual or allergic reaction to doxorubicin, other chemotherapy agents,  other medicines, foods, dyes, or preservatives -pregnant or trying to get pregnant -breast-feeding How should I use this medicine? This drug is given as an infusion into a vein. It is administered in a hospital or clinic by a specially trained health care professional. If you have pain, swelling, burning or any unusual feeling around the site of your injection, tell your health care professional right away. Talk to your pediatrician regarding the use of this medicine in children. Special care may be needed. Overdosage: If you think you have taken too much of this medicine contact a poison control center or emergency room at once. NOTE: This medicine is only for you. Do not share this medicine with others. What if I miss a dose? It is important not to miss your dose. Call your doctor or health care professional if you are unable to keep an appointment. What may interact with this medicine? This medicine may interact with the following medications: -6-mercaptopurine -paclitaxel -phenytoin -St. John's Wort -trastuzumab -verapamil This list may not describe all possible interactions. Give your health care provider a list of all the medicines, herbs, non-prescription drugs, or dietary supplements you use. Also tell them if you smoke, drink alcohol, or use illegal drugs. Some items may interact with your medicine. What should I watch for while using this medicine? This drug may make you feel generally unwell. This is not uncommon, as chemotherapy can affect healthy cells as well as cancer cells. Report any side effects. Continue your course of treatment even though you feel ill unless your doctor tells you to stop. There is a maximum amount of this medicine you should receive throughout your life.  The amount depends on the medical condition being treated and your overall health. Your doctor will watch how much of this medicine you receive in your lifetime. Tell your doctor if you have taken this  medicine before. You may need blood work done while you are taking this medicine. Your urine may turn red for a few days after your dose. This is not blood. If your urine is dark or brown, call your doctor. In some cases, you may be given additional medicines to help with side effects. Follow all directions for their use. Call your doctor or health care professional for advice if you get a fever, chills or sore throat, or other symptoms of a cold or flu. Do not treat yourself. This drug decreases your body's ability to fight infections. Try to avoid being around people who are sick. This medicine may increase your risk to bruise or bleed. Call your doctor or health care professional if you notice any unusual bleeding. Talk to your doctor about your risk of cancer. You may be more at risk for certain types of cancers if you take this medicine. Do not become pregnant while taking this medicine or for 6 months after stopping it. Women should inform their doctor if they wish to become pregnant or think they might be pregnant. Men should not father a child while taking this medicine and for 6 months after stopping it. There is a potential for serious side effects to an unborn child. Talk to your health care professional or pharmacist for more information. Do not breast-feed an infant while taking this medicine. This medicine has caused ovarian failure in some women and reduced sperm counts in some men This medicine may interfere with the ability to have a child. Talk with your doctor or health care professional if you are concerned about your fertility. What side effects may I notice from receiving this medicine? Side effects that you should report to your doctor or health care professional as soon as possible: -allergic reactions like skin rash, itching or hives, swelling of the face, lips, or tongue -breathing problems -chest pain -fast or irregular heartbeat -low blood counts - this medicine may  decrease the number of white blood cells, red blood cells and platelets. You may be at increased risk for infections and bleeding. -pain, redness, or irritation at site where injected -signs of infection - fever or chills, cough, sore throat, pain or difficulty passing urine -signs of decreased platelets or bleeding - bruising, pinpoint red spots on the skin, black, tarry stools, blood in the urine -swelling of the ankles, feet, hands -tiredness -weakness Side effects that usually do not require medical attention (report to your doctor or health care professional if they continue or are bothersome): -diarrhea -hair loss -mouth sores -nail discoloration or damage -nausea -red colored urine -vomiting This list may not describe all possible side effects. Call your doctor for medical advice about side effects. You may report side effects to FDA at 1-800-FDA-1088. Where should I keep my medicine? This drug is given in a hospital or clinic and will not be stored at home. NOTE: This sheet is a summary. It may not cover all possible information. If you have questions about this medicine, talk to your doctor, pharmacist, or health care provider.  2018 Elsevier/Gold Standard (2015-11-21 11:28:51)    Cyclophosphamide injection What is this medicine? CYCLOPHOSPHAMIDE (sye kloe FOSS fa mide) is a chemotherapy drug. It slows the growth of cancer cells. This medicine is used to treat  many types of cancer like lymphoma, myeloma, leukemia, breast cancer, and ovarian cancer, to name a few. This medicine may be used for other purposes; ask your health care provider or pharmacist if you have questions. COMMON BRAND NAME(S): Cytoxan, Neosar What should I tell my health care provider before I take this medicine? They need to know if you have any of these conditions: -blood disorders -history of other chemotherapy -infection -kidney disease -liver disease -recent or ongoing radiation therapy -tumors in  the bone marrow -an unusual or allergic reaction to cyclophosphamide, other chemotherapy, other medicines, foods, dyes, or preservatives -pregnant or trying to get pregnant -breast-feeding How should I use this medicine? This drug is usually given as an injection into a vein or muscle or by infusion into a vein. It is administered in a hospital or clinic by a specially trained health care professional. Talk to your pediatrician regarding the use of this medicine in children. Special care may be needed. Overdosage: If you think you have taken too much of this medicine contact a poison control center or emergency room at once. NOTE: This medicine is only for you. Do not share this medicine with others. What if I miss a dose? It is important not to miss your dose. Call your doctor or health care professional if you are unable to keep an appointment. What may interact with this medicine? This medicine may interact with the following medications: -amiodarone -amphotericin B -azathioprine -certain antiviral medicines for HIV or AIDS such as protease inhibitors (e.g., indinavir, ritonavir) and zidovudine -certain blood pressure medications such as benazepril, captopril, enalapril, fosinopril, lisinopril, moexipril, monopril, perindopril, quinapril, ramipril, trandolapril -certain cancer medications such as anthracyclines (e.g., daunorubicin, doxorubicin), busulfan, cytarabine, paclitaxel, pentostatin, tamoxifen, trastuzumab -certain diuretics such as chlorothiazide, chlorthalidone, hydrochlorothiazide, indapamide, metolazone -certain medicines that treat or prevent blood clots like warfarin -certain muscle relaxants such as succinylcholine -cyclosporine -etanercept -indomethacin -medicines to increase blood counts like filgrastim, pegfilgrastim, sargramostim -medicines used as general anesthesia -metronidazole -natalizumab This list may not describe all possible interactions. Give your health  care provider a list of all the medicines, herbs, non-prescription drugs, or dietary supplements you use. Also tell them if you smoke, drink alcohol, or use illegal drugs. Some items may interact with your medicine. What should I watch for while using this medicine? Visit your doctor for checks on your progress. This drug may make you feel generally unwell. This is not uncommon, as chemotherapy can affect healthy cells as well as cancer cells. Report any side effects. Continue your course of treatment even though you feel ill unless your doctor tells you to stop. Drink water or other fluids as directed. Urinate often, even at night. In some cases, you may be given additional medicines to help with side effects. Follow all directions for their use. Call your doctor or health care professional for advice if you get a fever, chills or sore throat, or other symptoms of a cold or flu. Do not treat yourself. This drug decreases your body's ability to fight infections. Try to avoid being around people who are sick. This medicine may increase your risk to bruise or bleed. Call your doctor or health care professional if you notice any unusual bleeding. Be careful brushing and flossing your teeth or using a toothpick because you may get an infection or bleed more easily. If you have any dental work done, tell your dentist you are receiving this medicine. You may get drowsy or dizzy. Do not drive, use machinery, or  do anything that needs mental alertness until you know how this medicine affects you. Do not become pregnant while taking this medicine or for 1 year after stopping it. Women should inform their doctor if they wish to become pregnant or think they might be pregnant. Men should not father a child while taking this medicine and for 4 months after stopping it. There is a potential for serious side effects to an unborn child. Talk to your health care professional or pharmacist for more information. Do not  breast-feed an infant while taking this medicine. This medicine may interfere with the ability to have a child. This medicine has caused ovarian failure in some women. This medicine has caused reduced sperm counts in some men. You should talk with your doctor or health care professional if you are concerned about your fertility. If you are going to have surgery, tell your doctor or health care professional that you have taken this medicine. What side effects may I notice from receiving this medicine? Side effects that you should report to your doctor or health care professional as soon as possible: -allergic reactions like skin rash, itching or hives, swelling of the face, lips, or tongue -low blood counts - this medicine may decrease the number of white blood cells, red blood cells and platelets. You may be at increased risk for infections and bleeding. -signs of infection - fever or chills, cough, sore throat, pain or difficulty passing urine -signs of decreased platelets or bleeding - bruising, pinpoint red spots on the skin, black, tarry stools, blood in the urine -signs of decreased red blood cells - unusually weak or tired, fainting spells, lightheadedness -breathing problems -dark urine -dizziness -palpitations -swelling of the ankles, feet, hands -trouble passing urine or change in the amount of urine -weight gain -yellowing of the eyes or skin Side effects that usually do not require medical attention (report to your doctor or health care professional if they continue or are bothersome): -changes in nail or skin color -hair loss -missed menstrual periods -mouth sores -nausea, vomiting This list may not describe all possible side effects. Call your doctor for medical advice about side effects. You may report side effects to FDA at 1-800-FDA-1088. Where should I keep my medicine? This drug is given in a hospital or clinic and will not be stored at home. NOTE: This sheet is a  summary. It may not cover all possible information. If you have questions about this medicine, talk to your doctor, pharmacist, or health care provider.  2018 Elsevier/Gold Standard (2012-08-08 16:22:58)  Pegfilgrastim injection What is this medicine? PEGFILGRASTIM (PEG fil gra stim) is a long-acting granulocyte colony-stimulating factor that stimulates the growth of neutrophils, a type of white blood cell important in the body's fight against infection. It is used to reduce the incidence of fever and infection in patients with certain types of cancer who are receiving chemotherapy that affects the bone marrow, and to increase survival after being exposed to high doses of radiation. This medicine may be used for other purposes; ask your health care provider or pharmacist if you have questions. COMMON BRAND NAME(S): Neulasta What should I tell my health care provider before I take this medicine? They need to know if you have any of these conditions: -kidney disease -latex allergy -ongoing radiation therapy -sickle cell disease -skin reactions to acrylic adhesives (On-Body Injector only) -an unusual or allergic reaction to pegfilgrastim, filgrastim, other medicines, foods, dyes, or preservatives -pregnant or trying to get pregnant -breast-feeding How  should I use this medicine? This medicine is for injection under the skin. If you get this medicine at home, you will be taught how to prepare and give the pre-filled syringe or how to use the On-body Injector. Refer to the patient Instructions for Use for detailed instructions. Use exactly as directed. Tell your healthcare provider immediately if you suspect that the On-body Injector may not have performed as intended or if you suspect the use of the On-body Injector resulted in a missed or partial dose. It is important that you put your used needles and syringes in a special sharps container. Do not put them in a trash can. If you do not have a  sharps container, call your pharmacist or healthcare provider to get one. Talk to your pediatrician regarding the use of this medicine in children. While this drug may be prescribed for selected conditions, precautions do apply. Overdosage: If you think you have taken too much of this medicine contact a poison control center or emergency room at once. NOTE: This medicine is only for you. Do not share this medicine with others. What if I miss a dose? It is important not to miss your dose. Call your doctor or health care professional if you miss your dose. If you miss a dose due to an On-body Injector failure or leakage, a new dose should be administered as soon as possible using a single prefilled syringe for manual use. What may interact with this medicine? Interactions have not been studied. Give your health care provider a list of all the medicines, herbs, non-prescription drugs, or dietary supplements you use. Also tell them if you smoke, drink alcohol, or use illegal drugs. Some items may interact with your medicine. This list may not describe all possible interactions. Give your health care provider a list of all the medicines, herbs, non-prescription drugs, or dietary supplements you use. Also tell them if you smoke, drink alcohol, or use illegal drugs. Some items may interact with your medicine. What should I watch for while using this medicine? You may need blood work done while you are taking this medicine. If you are going to need a MRI, CT scan, or other procedure, tell your doctor that you are using this medicine (On-Body Injector only). What side effects may I notice from receiving this medicine? Side effects that you should report to your doctor or health care professional as soon as possible: -allergic reactions like skin rash, itching or hives, swelling of the face, lips, or tongue -dizziness -fever -pain, redness, or irritation at site where injected -pinpoint red spots on the  skin -red or dark-brown urine -shortness of breath or breathing problems -stomach or side pain, or pain at the shoulder -swelling -tiredness -trouble passing urine or change in the amount of urine Side effects that usually do not require medical attention (report to your doctor or health care professional if they continue or are bothersome): -bone pain -muscle pain This list may not describe all possible side effects. Call your doctor for medical advice about side effects. You may report side effects to FDA at 1-800-FDA-1088. Where should I keep my medicine? Keep out of the reach of children. Store pre-filled syringes in a refrigerator between 2 and 8 degrees C (36 and 46 degrees F). Do not freeze. Keep in carton to protect from light. Throw away this medicine if it is left out of the refrigerator for more than 48 hours. Throw away any unused medicine after the expiration date. NOTE: This  sheet is a summary. It may not cover all possible information. If you have questions about this medicine, talk to your doctor, pharmacist, or health care provider.  2018 Elsevier/Gold Standard (2016-09-20 12:58:03)   Pegfilgrastim injection What is this medicine? PEGFILGRASTIM (PEG fil gra stim) is a long-acting granulocyte colony-stimulating factor that stimulates the growth of neutrophils, a type of white blood cell important in the body's fight against infection. It is used to reduce the incidence of fever and infection in patients with certain types of cancer who are receiving chemotherapy that affects the bone marrow, and to increase survival after being exposed to high doses of radiation. This medicine may be used for other purposes; ask your health care provider or pharmacist if you have questions. COMMON BRAND NAME(S): Neulasta What should I tell my health care provider before I take this medicine? They need to know if you have any of these conditions: -kidney disease -latex allergy -ongoing  radiation therapy -sickle cell disease -skin reactions to acrylic adhesives (On-Body Injector only) -an unusual or allergic reaction to pegfilgrastim, filgrastim, other medicines, foods, dyes, or preservatives -pregnant or trying to get pregnant -breast-feeding How should I use this medicine? This medicine is for injection under the skin. If you get this medicine at home, you will be taught how to prepare and give the pre-filled syringe or how to use the On-body Injector. Refer to the patient Instructions for Use for detailed instructions. Use exactly as directed. Tell your healthcare provider immediately if you suspect that the On-body Injector may not have performed as intended or if you suspect the use of the On-body Injector resulted in a missed or partial dose. It is important that you put your used needles and syringes in a special sharps container. Do not put them in a trash can. If you do not have a sharps container, call your pharmacist or healthcare provider to get one. Talk to your pediatrician regarding the use of this medicine in children. While this drug may be prescribed for selected conditions, precautions do apply. Overdosage: If you think you have taken too much of this medicine contact a poison control center or emergency room at once. NOTE: This medicine is only for you. Do not share this medicine with others. What if I miss a dose? It is important not to miss your dose. Call your doctor or health care professional if you miss your dose. If you miss a dose due to an On-body Injector failure or leakage, a new dose should be administered as soon as possible using a single prefilled syringe for manual use. What may interact with this medicine? Interactions have not been studied. Give your health care provider a list of all the medicines, herbs, non-prescription drugs, or dietary supplements you use. Also tell them if you smoke, drink alcohol, or use illegal drugs. Some items may  interact with your medicine. This list may not describe all possible interactions. Give your health care provider a list of all the medicines, herbs, non-prescription drugs, or dietary supplements you use. Also tell them if you smoke, drink alcohol, or use illegal drugs. Some items may interact with your medicine. What should I watch for while using this medicine? You may need blood work done while you are taking this medicine. If you are going to need a MRI, CT scan, or other procedure, tell your doctor that you are using this medicine (On-Body Injector only). What side effects may I notice from receiving this medicine? Side effects that you  should report to your doctor or health care professional as soon as possible: -allergic reactions like skin rash, itching or hives, swelling of the face, lips, or tongue -dizziness -fever -pain, redness, or irritation at site where injected -pinpoint red spots on the skin -red or dark-brown urine -shortness of breath or breathing problems -stomach or side pain, or pain at the shoulder -swelling -tiredness -trouble passing urine or change in the amount of urine Side effects that usually do not require medical attention (report to your doctor or health care professional if they continue or are bothersome): -bone pain -muscle pain This list may not describe all possible side effects. Call your doctor for medical advice about side effects. You may report side effects to FDA at 1-800-FDA-1088. Where should I keep my medicine? Keep out of the reach of children. Store pre-filled syringes in a refrigerator between 2 and 8 degrees C (36 and 46 degrees F). Do not freeze. Keep in carton to protect from light. Throw away this medicine if it is left out of the refrigerator for more than 48 hours. Throw away any unused medicine after the expiration date. NOTE: This sheet is a summary. It may not cover all possible information. If you have questions about this  medicine, talk to your doctor, pharmacist, or health care provider.  2018 [employer]/Gold Standard (2016-09-20 12:58:03)

## 2017-09-17 NOTE — Telephone Encounter (Signed)
This RN spoke with pt this AM per appointment yesterday for 1st time chemo - pt will come in at 11 am for chemo today.

## 2017-09-18 ENCOUNTER — Telehealth: Payer: Self-pay

## 2017-09-18 NOTE — Telephone Encounter (Signed)
Called pt to follow up on her first time chemo yesterday. Pt states that she is doing well. Has some mild nausea but taking her nausea medication as instructed. Pt drinking plenty of fluids and aware of when to seek emergent help when needed. No further concerns at this time.

## 2017-09-20 ENCOUNTER — Other Ambulatory Visit: Payer: Self-pay

## 2017-09-20 DIAGNOSIS — C50211 Malignant neoplasm of upper-inner quadrant of right female breast: Secondary | ICD-10-CM

## 2017-09-20 DIAGNOSIS — Z170 Estrogen receptor positive status [ER+]: Principal | ICD-10-CM

## 2017-09-20 MED ORDER — PROCHLORPERAZINE MALEATE 10 MG PO TABS: 10.0000 mg | ORAL_TABLET | Freq: Four times a day (QID) | ORAL | 0 refills | Status: DC | PRN

## 2017-09-21 NOTE — Assessment & Plan Note (Addendum)
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  Mammaprint high risk: Dose since Adriamycin and Cytoxan x4 followed by Taxol weekly x12 started 09/23/2017 2. adjuvant radiation therapy 3.  Followed by adjuvant antiestrogen therapy -------------------------------------------------------------------------------------------------------------------------------------- UPBEAT clinical trial ([id number]): Newly diagnosed stage I to III breast cancer patients receiving either adjuvant or neoadjuvant chemotherapy undergo cardiac MRI before treatment and at 24 months along with neurocognitive testing, exercise and disability measures at baseline 3, 12 and 24 months.  Current treatment: Cycle 1 day 8 dose dense Adriamycin and Cytoxan Echocardiogram 09/09/2017: EF 65-70% Chemo toxicities: 1.   Closely monitoring for chemotherapy toxicities. Return to clinic in one week for cycle 2

## 2017-09-23 ENCOUNTER — Ambulatory Visit (HOSPITAL_BASED_OUTPATIENT_CLINIC_OR_DEPARTMENT_OTHER): Payer: Medicare Other | Admitting: Hematology and Oncology

## 2017-09-23 ENCOUNTER — Other Ambulatory Visit (HOSPITAL_BASED_OUTPATIENT_CLINIC_OR_DEPARTMENT_OTHER): Payer: Medicare Other

## 2017-09-23 DIAGNOSIS — Z170 Estrogen receptor positive status [ER+]: Secondary | ICD-10-CM

## 2017-09-23 DIAGNOSIS — C50211 Malignant neoplasm of upper-inner quadrant of right female breast: Secondary | ICD-10-CM

## 2017-09-23 DIAGNOSIS — C773 Secondary and unspecified malignant neoplasm of axilla and upper limb lymph nodes: Secondary | ICD-10-CM

## 2017-09-23 LAB — COMPREHENSIVE METABOLIC PANEL
ALT: 31 U/L (ref 0–55)
ANION GAP: 10 meq/L (ref 3–11)
AST: 16 U/L (ref 5–34)
Albumin: 3.7 g/dL (ref 3.5–5.0)
Alkaline Phosphatase: 137 U/L (ref 40–150)
BILIRUBIN TOTAL: 0.48 mg/dL (ref 0.20–1.20)
BUN: 19.4 mg/dL (ref 7.0–26.0)
CO2: 22 meq/L (ref 22–29)
Calcium: 9.1 mg/dL (ref 8.4–10.4)
Chloride: 104 mEq/L (ref 98–109)
Creatinine: 0.8 mg/dL (ref 0.6–1.1)
Glucose: 106 mg/dl (ref 70–140)
Potassium: 4 mEq/L (ref 3.5–5.1)
Sodium: 135 mEq/L — ABNORMAL LOW (ref 136–145)
TOTAL PROTEIN: 7.2 g/dL (ref 6.4–8.3)

## 2017-09-23 LAB — CBC WITH DIFFERENTIAL/PLATELET
BASO%: 1.4 % (ref 0.0–2.0)
Basophils Absolute: 0 10*3/uL (ref 0.0–0.1)
EOS ABS: 0.2 10*3/uL (ref 0.0–0.5)
EOS%: 20.1 % — ABNORMAL HIGH (ref 0.0–7.0)
HCT: 35 % (ref 34.8–46.6)
HGB: 11.9 g/dL (ref 11.6–15.9)
LYMPH%: 55.7 % — AB (ref 14.0–49.7)
MCH: 29.8 pg (ref 25.1–34.0)
MCHC: 33.9 g/dL (ref 31.5–36.0)
MCV: 87.9 fL (ref 79.5–101.0)
MONO#: 0 10*3/uL — AB (ref 0.1–0.9)
MONO%: 2.5 % (ref 0.0–14.0)
NEUT%: 20.3 % — AB (ref 38.4–76.8)
NEUTROS ABS: 0.2 10*3/uL — AB (ref 1.5–6.5)
PLATELETS: 159 10*3/uL (ref 145–400)
RBC: 3.99 10*6/uL (ref 3.70–5.45)
RDW: 13.4 % (ref 11.2–14.5)
WBC: 1.1 10*3/uL — ABNORMAL LOW (ref 3.9–10.3)
lymph#: 0.6 10*3/uL — ABNORMAL LOW (ref 0.9–3.3)

## 2017-09-23 NOTE — Progress Notes (Signed)
Patient Care Team: [name], MD as PCP - General (Family Medicine) [name], MD as Consulting Physician (General Surgery) Nicholas Lose, MD as Consulting Physician (Hematology and Oncology) Kyung Rudd, MD as Consulting Physician (Radiation Oncology)  DIAGNOSIS:  Encounter Diagnosis  Name Primary?  . Malignant neoplasm of upper-inner quadrant of right breast in female, estrogen receptor positive ([place])     SUMMARY OF ONCOLOGIC HISTORY:   Malignant neoplasm of upper-inner quadrant of right breast in female, estrogen receptor positive ([place])   06/21/2017 Initial Diagnosis    Screening detected right breast mass and calcifications 1 cm at 1:00 position biopsy of mass and calcifications revealed invasive lobular cancer with pleomorphic features, grade 2, axillary lymph node biopsy positive for cancer, ER/PR positive HER-2 negative with a Ki-67 of 40%, T1b N1 stage IB AJCC 8      08/06/2017 Surgery    Right mastectomy: Scattered foci of invasive lobular cancer, grade 2, 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, LCIS, margins negative, 2/6 lymph nodes +1 with isolated tumor cells, T1BN1 stage I the AJCC 8 ER 95%, PR 30%, HER-2 negative, Ki-67 40%      08/12/2017 Miscellaneous    Mammaprint high risk luminal type B      09/17/2017 -  Chemotherapy    Adjuvant chemo with dose dense Adriamycin and Cytoxan followed by Taxol weekly x12       CHIEF COMPLIANT: Cycle 1 day 7 dose dense Adriamycin and Cytoxan  INTERVAL HISTORY: Gloria Ware is a 71 year old with above-mentioned history of right breast cancer treated with mastectomy and is currently on adjuvant chemotherapy and today's cycle 1 day 8 of dose dense Adriamycin and Cytoxan.  She had mild nausea that lasted 4 days after chemotherapy.  She also felt profound fatigue that is slowly starting to get better.  She denies any vomiting.  Denies any fevers or chills.  REVIEW OF SYSTEMS:   Constitutional: Denies fevers, chills or abnormal weight  loss, complains of fatigue Eyes: Denies blurriness of vision Ears, nose, mouth, throat, and face: Denies mucositis or sore throat Respiratory: Denies cough, dyspnea or wheezes Cardiovascular: Denies palpitation, chest discomfort Gastrointestinal: Nausea Skin: Denies abnormal skin rashes Lymphatics: Denies new lymphadenopathy or easy bruising Neurological:Denies numbness, tingling or new weaknesses Behavioral/Psych: Mood is stable, no new changes  Extremities: No lower extremity edema  All other systems were reviewed with the patient and are negative.  I have reviewed the past medical history, past surgical history, social history and family history with the patient and they are unchanged from previous note.  ALLERGIES:  is allergic to penicillins; sulfa antibiotics; atorvastatin; and zetia [ezetimibe].  MEDICATIONS:  Current Outpatient Medications  Medication Sig Dispense Refill  . acetaminophen (TYLENOL) 500 MG tablet Take 500-1,000 mg by mouth every 6 (six) hours as needed for moderate pain or headache.     . anastrozole (ARIMIDEX) 1 MG tablet Take 1 tablet (1 mg total) by mouth daily. (Patient taking differently: Take 1 mg by mouth at bedtime. ) 90 tablet 3  . aspirin 81 MG tablet Take 81 mg by mouth daily.      . Calcium Carbonate-Vitamin D (CALTRATE 600+D PO) Take 1 tablet by mouth 2 (two) times daily.    Marland Kitchen lidocaine-prilocaine (EMLA) cream Apply to affected area once 30 g 3  . loratadine (CLARITIN) 10 MG tablet Take 10 mg by mouth as needed for allergies.    Marland Kitchen LORazepam (ATIVAN) 0.5 MG tablet Take 1 tablet (0.5 mg total) by mouth at bedtime  as needed (Nausea or vomiting). 30 tablet 0  . methocarbamol (ROBAXIN) 500 MG tablet Take 1 tablet (500 mg total) by mouth every 6 (six) hours as needed for muscle spasms. (Patient not taking: Reported on 09/04/2017) 20 tablet 1  . ondansetron (ZOFRAN) 8 MG tablet Take 1 tablet (8 mg total) by mouth 2 (two) times daily as needed. Start on the  third day after chemotherapy. 30 tablet 1  . oxyCODONE (OXY IR/ROXICODONE) 5 MG immediate release tablet Take 1-2 tablets (5-10 mg total) by mouth every 4 (four) hours as needed for moderate pain. (Patient not taking: Reported on 09/04/2017) 20 tablet 0  . prochlorperazine (COMPAZINE) 10 MG tablet Take 1 tablet (10 mg total) by mouth every 6 (six) hours as needed (Nausea or vomiting). 90 tablet 0  . sertraline (ZOLOFT) 100 MG tablet Take 200 mg by mouth daily.      No current facility-administered medications for this visit.     PHYSICAL EXAMINATION: ECOG PERFORMANCE STATUS: 1 - Symptomatic but completely ambulatory  Vitals:   09/23/17 1512  BP: (!) 104/53  Pulse: 98  Resp: 17  Temp: 97.7 F (36.5 C)  SpO2: 99%   Filed Weights   09/23/17 1512  Weight: 146 lb 9.6 oz (66.5 kg)    GENERAL:alert, no distress and comfortable SKIN: skin color, texture, turgor are normal, no rashes or significant lesions EYES: normal, Conjunctiva are pink and non-injected, sclera clear OROPHARYNX:no exudate, no erythema and lips, buccal mucosa, and tongue normal  NECK: supple, thyroid normal size, non-tender, without nodularity LYMPH:  no palpable lymphadenopathy in the cervical, axillary or inguinal LUNGS: clear to auscultation and percussion with normal breathing effort HEART: regular rate & rhythm and no murmurs and no lower extremity edema ABDOMEN:abdomen soft, non-tender and normal bowel sounds MUSCULOSKELETAL:no cyanosis of digits and no clubbing  NEURO: alert & oriented x 3 with fluent speech, no focal motor/sensory deficits EXTREMITIES: No lower extremity edema  LABORATORY DATA:  I have reviewed the data as listed   Chemistry      Component Value Date/Time   NA 135 (L) 09/23/2017 1436   K 4.0 09/23/2017 1436   CL 103 09/10/2017 1122   CO2 22 09/23/2017 1436   BUN 19.4 09/23/2017 1436   CREATININE 0.8 09/23/2017 1436      Component Value Date/Time   CALCIUM 9.1 09/23/2017 1436    ALKPHOS 137 09/23/2017 1436   AST 16 09/23/2017 1436   ALT 31 09/23/2017 1436   BILITOT 0.48 09/23/2017 1436       Lab Results  Component Value Date   WBC 1.1 (L) 09/23/2017   HGB 11.9 09/23/2017   HCT 35.0 09/23/2017   MCV 87.9 09/23/2017   PLT 159 09/23/2017   NEUTROABS 0.2 (LL) 09/23/2017    ASSESSMENT & PLAN:  Malignant neoplasm of upper-inner quadrant of right breast in female, estrogen receptor positive ([place]) 08/06/2017 right mastectomy: Scattered foci of invasive lobular cancer, grade 2, 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  Mammaprint high risk: Dose since Adriamycin and Cytoxan x4 followed by Taxol weekly x12 started 09/23/2017 2. adjuvant radiation therapy 3.  Followed by adjuvant antiestrogen therapy -------------------------------------------------------------------------------------------------------------------------------------- UPBEAT clinical trial ([id number]): Newly diagnosed stage I to III breast cancer patients receiving either adjuvant or neoadjuvant chemotherapy undergo cardiac MRI before treatment and at 24 months along with  neurocognitive testing, exercise and disability measures at baseline 3, 12 and 24 months.  Current treatment: Cycle 1 day 8 dose dense Adriamycin and Cytoxan Echocardiogram 09/09/2017: EF 65-70% Chemo toxicities: 1.  Severe fatigue: Improvement since yesterday 2. 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  She is hoping to get stronger and time to be performing in the orchestra.  She hopes to perform in a few more orchestra performances throughout her chemotherapy.  Closely monitoring for chemotherapy toxicities. Return to clinic in one week for cycle 2.  I spent 25 minutes talking to the patient of which more than half  was spent in counseling and coordination of care.  No orders of the defined types were placed in this encounter.  The patient has a good understanding of the overall plan. she agrees with it. she will call with any problems that may develop before the next visit here.   Rulon Eisenmenger, MD 09/23/17

## 2017-09-25 ENCOUNTER — Telehealth: Payer: Self-pay | Admitting: Hematology and Oncology

## 2017-09-25 NOTE — Telephone Encounter (Signed)
No 12/17 los °

## 2017-09-30 ENCOUNTER — Ambulatory Visit: Payer: Medicare Other

## 2017-09-30 ENCOUNTER — Other Ambulatory Visit: Payer: Self-pay | Admitting: Medical

## 2017-09-30 ENCOUNTER — Encounter: Payer: Self-pay | Admitting: Adult Health

## 2017-09-30 ENCOUNTER — Ambulatory Visit (HOSPITAL_BASED_OUTPATIENT_CLINIC_OR_DEPARTMENT_OTHER): Payer: Medicare Other

## 2017-09-30 ENCOUNTER — Other Ambulatory Visit (HOSPITAL_BASED_OUTPATIENT_CLINIC_OR_DEPARTMENT_OTHER): Payer: Medicare Other

## 2017-09-30 ENCOUNTER — Ambulatory Visit (HOSPITAL_COMMUNITY)
Admission: RE | Admit: 2017-09-30 | Discharge: 2017-09-30 | Disposition: A | Discharge status: Home / Self Care | Payer: Medicare Other | Source: Ambulatory Visit | Attending: Medical | Admitting: Medical

## 2017-09-30 ENCOUNTER — Ambulatory Visit (HOSPITAL_BASED_OUTPATIENT_CLINIC_OR_DEPARTMENT_OTHER): Payer: Medicare Other | Admitting: Adult Health

## 2017-09-30 VITALS — BP 141/58 | HR 95 | Temp 98.4°F | Resp 17 | Wt 141.0 lb

## 2017-09-30 DIAGNOSIS — Z95828 Presence of other vascular implants and grafts: Secondary | ICD-10-CM | POA: Insufficient documentation

## 2017-09-30 DIAGNOSIS — C50211 Malignant neoplasm of upper-inner quadrant of right female breast: Secondary | ICD-10-CM

## 2017-09-30 DIAGNOSIS — Z170 Estrogen receptor positive status [ER+]: Principal | ICD-10-CM

## 2017-09-30 DIAGNOSIS — T82598A Other mechanical complication of other cardiac and vascular devices and implants, initial encounter: Secondary | ICD-10-CM | POA: Diagnosis not present

## 2017-09-30 DIAGNOSIS — Y838 Other surgical procedures as the cause of abnormal reaction of the patient, or of later complication, without mention of misadventure at the time of the procedure: Secondary | ICD-10-CM | POA: Diagnosis not present

## 2017-09-30 DIAGNOSIS — C50919 Malignant neoplasm of unspecified site of unspecified female breast: Secondary | ICD-10-CM

## 2017-09-30 DIAGNOSIS — Z5189 Encounter for other specified aftercare: Secondary | ICD-10-CM

## 2017-09-30 DIAGNOSIS — C773 Secondary and unspecified malignant neoplasm of axilla and upper limb lymph nodes: Secondary | ICD-10-CM

## 2017-09-30 DIAGNOSIS — T82898A Other specified complication of vascular prosthetic devices, implants and grafts, initial encounter: Secondary | ICD-10-CM | POA: Diagnosis not present

## 2017-09-30 DIAGNOSIS — Z5111 Encounter for antineoplastic chemotherapy: Secondary | ICD-10-CM | POA: Diagnosis present

## 2017-09-30 HISTORY — PX: IR CV LINE INJECTION: IMG2294

## 2017-09-30 LAB — CBC WITH DIFFERENTIAL/PLATELET
BASO%: 0.5 % (ref 0.0–2.0)
Basophils Absolute: 0.1 10*3/uL (ref 0.0–0.1)
EOS ABS: 0.1 10*3/uL (ref 0.0–0.5)
EOS%: 0.8 % (ref 0.0–7.0)
HCT: 34.3 % — ABNORMAL LOW (ref 34.8–46.6)
HGB: 11.3 g/dL — ABNORMAL LOW (ref 11.6–15.9)
LYMPH%: 14.6 % (ref 14.0–49.7)
MCH: 28.9 pg (ref 25.1–34.0)
MCHC: 33 g/dL (ref 31.5–36.0)
MCV: 87.6 fL (ref 79.5–101.0)
MONO#: 0.7 10*3/uL (ref 0.1–0.9)
MONO%: 5.9 % (ref 0.0–14.0)
NEUT%: 78.2 % — AB (ref 38.4–76.8)
NEUTROS ABS: 9.9 10*3/uL — AB (ref 1.5–6.5)
PLATELETS: 183 10*3/uL (ref 145–400)
RBC: 3.92 10*6/uL (ref 3.70–5.45)
RDW: 13.7 % (ref 11.2–14.5)
WBC: 12.6 10*3/uL — AB (ref 3.9–10.3)
lymph#: 1.8 10*3/uL (ref 0.9–3.3)

## 2017-09-30 LAB — COMPREHENSIVE METABOLIC PANEL
ALBUMIN: 3.7 g/dL (ref 3.5–5.0)
ALT: 13 U/L (ref 0–55)
AST: 12 U/L (ref 5–34)
Alkaline Phosphatase: 136 U/L (ref 40–150)
Anion Gap: 11 mEq/L (ref 3–11)
BUN: 14.4 mg/dL (ref 7.0–26.0)
CALCIUM: 8.9 mg/dL (ref 8.4–10.4)
CHLORIDE: 104 meq/L (ref 98–109)
CO2: 23 mEq/L (ref 22–29)
CREATININE: 1 mg/dL (ref 0.6–1.1)
EGFR: 54 mL/min/{1.73_m2} — ABNORMAL LOW (ref >60)
GLUCOSE: 120 mg/dL (ref 70–140)
POTASSIUM: 4 meq/L (ref 3.5–5.1)
SODIUM: 138 meq/L (ref 136–145)
Total Bilirubin: <0.22 mg/dL (ref 0.20–1.20)
Total Protein: 6.9 g/dL (ref 6.4–8.3)

## 2017-09-30 MED ORDER — DOXORUBICIN HCL CHEMO IV INJECTION 2 MG/ML
50.0000 mg/m2 | Freq: Once | INTRAVENOUS | Status: AC
  Administered 2017-09-30: 84 mg via INTRAVENOUS
  Filled 2017-09-30: qty 42

## 2017-09-30 MED ORDER — SODIUM CHLORIDE 0.9 % IV SOLN
500.0000 mg/m2 | Freq: Once | INTRAVENOUS | Status: AC
  Administered 2017-09-30: 840 mg via INTRAVENOUS
  Filled 2017-09-30: qty 42

## 2017-09-30 MED ORDER — LIDOCAINE-PRILOCAINE 2.5-2.5 % EX CREA
TOPICAL_CREAM | CUTANEOUS | Status: AC
  Filled 2017-09-30: qty 5

## 2017-09-30 MED ORDER — SODIUM CHLORIDE 0.9 % IV SOLN
Freq: Once | INTRAVENOUS | Status: AC
  Administered 2017-09-30: 16:00:00 via INTRAVENOUS
  Filled 2017-09-30: qty 5

## 2017-09-30 MED ORDER — IOPAMIDOL (ISOVUE-300) INJECTION 61%
50.0000 mL | Freq: Once | INTRAVENOUS | Status: AC | PRN
  Administered 2017-09-30: 10 mL

## 2017-09-30 MED ORDER — PEGFILGRASTIM 6 MG/0.6ML ~~LOC~~ PSKT
6.0000 mg | PREFILLED_SYRINGE | Freq: Once | SUBCUTANEOUS | Status: AC
  Administered 2017-09-30: 6 mg via SUBCUTANEOUS

## 2017-09-30 MED ORDER — SODIUM CHLORIDE 0.9 % IV SOLN
Freq: Once | INTRAVENOUS | Status: AC
  Administered 2017-09-30: 16:00:00 via INTRAVENOUS

## 2017-09-30 MED ORDER — SODIUM CHLORIDE 0.9% FLUSH
10.0000 mL | INTRAVENOUS | Status: DC | PRN
  Administered 2017-09-30: 10 mL
  Filled 2017-09-30: qty 10

## 2017-09-30 MED ORDER — PEGFILGRASTIM 6 MG/0.6ML ~~LOC~~ PSKT
PREFILLED_SYRINGE | SUBCUTANEOUS | Status: AC
  Filled 2017-09-30: qty 0.6

## 2017-09-30 MED ORDER — HEPARIN SOD (PORK) LOCK FLUSH 100 UNIT/ML IV SOLN
500.0000 [IU] | Freq: Once | INTRAVENOUS | Status: AC | PRN
  Administered 2017-09-30: 500 [IU]
  Filled 2017-09-30: qty 5

## 2017-09-30 MED ORDER — PALONOSETRON HCL INJECTION 0.25 MG/5ML
INTRAVENOUS | Status: AC
  Filled 2017-09-30: qty 5

## 2017-09-30 MED ORDER — IOPAMIDOL (ISOVUE-300) INJECTION 61%
INTRAVENOUS | Status: AC
  Filled 2017-09-30: qty 50

## 2017-09-30 MED ORDER — SODIUM CHLORIDE 0.9% FLUSH
10.0000 mL | INTRAVENOUS | Status: DC | PRN
  Administered 2017-09-30: 10 mL via INTRAVENOUS
  Filled 2017-09-30: qty 10

## 2017-09-30 MED ORDER — PALONOSETRON HCL INJECTION 0.25 MG/5ML
0.2500 mg | Freq: Once | INTRAVENOUS | Status: AC
  Administered 2017-09-30: 0.25 mg via INTRAVENOUS

## 2017-09-30 NOTE — Progress Notes (Signed)
These preliminary result these preliminary results were noted.  Awaiting final report.

## 2017-09-30 NOTE — Progress Notes (Signed)
Patient accessed by flush nurse with some diffficulty but able to obtain labs.  Patient presented to infusion room for treatment and unable to flush port, reaccessed with no ability to flush.  Patient send for IR study for placement.

## 2017-09-30 NOTE — Patient Instructions (Signed)
[facility] Discharge Instructions for Patients Receiving Chemotherapy  Today you received the following chemotherapy agents: Adriamycin, Cytoxan , Neulasta  To help prevent nausea and vomiting after your treatment, we encourage you to take your nausea medication as directed.    If you develop nausea and vomiting that is not controlled by your nausea medication, call the clinic.   BELOW ARE SYMPTOMS THAT SHOULD BE REPORTED IMMEDIATELY:  *FEVER GREATER THAN 100.5 F  *CHILLS WITH OR WITHOUT FEVER  NAUSEA AND VOMITING THAT IS NOT CONTROLLED WITH YOUR NAUSEA MEDICATION  *UNUSUAL SHORTNESS OF BREATH  *UNUSUAL BRUISING OR BLEEDING  TENDERNESS IN MOUTH AND THROAT WITH OR WITHOUT PRESENCE OF ULCERS  *URINARY PROBLEMS  *BOWEL PROBLEMS  UNUSUAL RASH Items with * indicate a potential emergency and should be followed up as soon as possible.  Feel free to call the clinic should you have any questions or concerns. The clinic phone number is [phone].  Please show the [facility] at check-in to the Emergency Department and triage nurse.    Doxorubicin injection What is this medicine? DOXORUBICIN (dox oh ROO bi sin) is a chemotherapy drug. It is used to treat many kinds of cancer like leukemia, lymphoma, neuroblastoma, sarcoma, and Wilms' tumor. It is also used to treat bladder cancer, breast cancer, lung cancer, ovarian cancer, stomach cancer, and thyroid cancer. This medicine may be used for other purposes; ask your health care provider or pharmacist if you have questions. COMMON BRAND NAME(S): Adriamycin, Adriamycin PFS, Adriamycin RDF, Rubex What should I tell my health care provider before I take this medicine? They need to know if you have any of these conditions: -heart disease -history of low blood counts caused by a medicine -liver disease -recent or ongoing radiation therapy -an unusual or allergic reaction to doxorubicin, other chemotherapy agents,  other medicines, foods, dyes, or preservatives -pregnant or trying to get pregnant -breast-feeding How should I use this medicine? This drug is given as an infusion into a vein. It is administered in a hospital or clinic by a specially trained health care professional. If you have pain, swelling, burning or any unusual feeling around the site of your injection, tell your health care professional right away. Talk to your pediatrician regarding the use of this medicine in children. Special care may be needed. Overdosage: If you think you have taken too much of this medicine contact a poison control center or emergency room at once. NOTE: This medicine is only for you. Do not share this medicine with others. What if I miss a dose? It is important not to miss your dose. Call your doctor or health care professional if you are unable to keep an appointment. What may interact with this medicine? This medicine may interact with the following medications: -6-mercaptopurine -paclitaxel -phenytoin -St. John's Wort -trastuzumab -verapamil This list may not describe all possible interactions. Give your health care provider a list of all the medicines, herbs, non-prescription drugs, or dietary supplements you use. Also tell them if you smoke, drink alcohol, or use illegal drugs. Some items may interact with your medicine. What should I watch for while using this medicine? This drug may make you feel generally unwell. This is not uncommon, as chemotherapy can affect healthy cells as well as cancer cells. Report any side effects. Continue your course of treatment even though you feel ill unless your doctor tells you to stop. There is a maximum amount of this medicine you should receive throughout your life.  The amount depends on the medical condition being treated and your overall health. Your doctor will watch how much of this medicine you receive in your lifetime. Tell your doctor if you have taken this  medicine before. You may need blood work done while you are taking this medicine. Your urine may turn red for a few days after your dose. This is not blood. If your urine is dark or brown, call your doctor. In some cases, you may be given additional medicines to help with side effects. Follow all directions for their use. Call your doctor or health care professional for advice if you get a fever, chills or sore throat, or other symptoms of a cold or flu. Do not treat yourself. This drug decreases your body's ability to fight infections. Try to avoid being around people who are sick. This medicine may increase your risk to bruise or bleed. Call your doctor or health care professional if you notice any unusual bleeding. Talk to your doctor about your risk of cancer. You may be more at risk for certain types of cancers if you take this medicine. Do not become pregnant while taking this medicine or for 6 months after stopping it. Women should inform their doctor if they wish to become pregnant or think they might be pregnant. Men should not father a child while taking this medicine and for 6 months after stopping it. There is a potential for serious side effects to an unborn child. Talk to your health care professional or pharmacist for more information. Do not breast-feed an infant while taking this medicine. This medicine has caused ovarian failure in some women and reduced sperm counts in some men This medicine may interfere with the ability to have a child. Talk with your doctor or health care professional if you are concerned about your fertility. What side effects may I notice from receiving this medicine? Side effects that you should report to your doctor or health care professional as soon as possible: -allergic reactions like skin rash, itching or hives, swelling of the face, lips, or tongue -breathing problems -chest pain -fast or irregular heartbeat -low blood counts - this medicine may  decrease the number of white blood cells, red blood cells and platelets. You may be at increased risk for infections and bleeding. -pain, redness, or irritation at site where injected -signs of infection - fever or chills, cough, sore throat, pain or difficulty passing urine -signs of decreased platelets or bleeding - bruising, pinpoint red spots on the skin, black, tarry stools, blood in the urine -swelling of the ankles, feet, hands -tiredness -weakness Side effects that usually do not require medical attention (report to your doctor or health care professional if they continue or are bothersome): -diarrhea -hair loss -mouth sores -nail discoloration or damage -nausea -red colored urine -vomiting This list may not describe all possible side effects. Call your doctor for medical advice about side effects. You may report side effects to FDA at 1-800-FDA-1088. Where should I keep my medicine? This drug is given in a hospital or clinic and will not be stored at home. NOTE: This sheet is a summary. It may not cover all possible information. If you have questions about this medicine, talk to your doctor, pharmacist, or health care provider.  2018 Else[employer]/Gold Standard (2015-11-21 11:28:51)    Cyclophosphamide injection What is this medicine? CYCLOPHOSPHAMIDE (sye kloe FOSS fa mide) is a chemotherapy drug. It slows the growth of cancer cells. This medicine is used to treat  many types of cancer like lymphoma, myeloma, leukemia, breast cancer, and ovarian cancer, to name a few. This medicine may be used for other purposes; ask your health care provider or pharmacist if you have questions. COMMON BRAND NAME(S): Cytoxan, Neosar What should I tell my health care provider before I take this medicine? They need to know if you have any of these conditions: -blood disorders -history of other chemotherapy -infection -kidney disease -liver disease -recent or ongoing radiation therapy -tumors in  the bone marrow -an unusual or allergic reaction to cyclophosphamide, other chemotherapy, other medicines, foods, dyes, or preservatives -pregnant or trying to get pregnant -breast-feeding How should I use this medicine? This drug is usually given as an injection into a vein or muscle or by infusion into a vein. It is administered in a hospital or clinic by a specially trained health care professional. Talk to your pediatrician regarding the use of this medicine in children. Special care may be needed. Overdosage: If you think you have taken too much of this medicine contact a poison control center or emergency room at once. NOTE: This medicine is only for you. Do not share this medicine with others. What if I miss a dose? It is important not to miss your dose. Call your doctor or health care professional if you are unable to keep an appointment. What may interact with this medicine? This medicine may interact with the following medications: -amiodarone -amphotericin B -azathioprine -certain antiviral medicines for HIV or AIDS such as protease inhibitors (e.g., indinavir, ritonavir) and zidovudine -certain blood pressure medications such as benazepril, captopril, enalapril, fosinopril, lisinopril, moexipril, monopril, perindopril, quinapril, ramipril, trandolapril -certain cancer medications such as anthracyclines (e.g., daunorubicin, doxorubicin), busulfan, cytarabine, paclitaxel, pentostatin, tamoxifen, trastuzumab -certain diuretics such as chlorothiazide, chlorthalidone, hydrochlorothiazide, indapamide, metolazone -certain medicines that treat or prevent blood clots like warfarin -certain muscle relaxants such as succinylcholine -cyclosporine -etanercept -indomethacin -medicines to increase blood counts like filgrastim, pegfilgrastim, sargramostim -medicines used as general anesthesia -metronidazole -natalizumab This list may not describe all possible interactions. Give your health  care provider a list of all the medicines, herbs, non-prescription drugs, or dietary supplements you use. Also tell them if you smoke, drink alcohol, or use illegal drugs. Some items may interact with your medicine. What should I watch for while using this medicine? Visit your doctor for checks on your progress. This drug may make you feel generally unwell. This is not uncommon, as chemotherapy can affect healthy cells as well as cancer cells. Report any side effects. Continue your course of treatment even though you feel ill unless your doctor tells you to stop. Drink water or other fluids as directed. Urinate often, even at night. In some cases, you may be given additional medicines to help with side effects. Follow all directions for their use. Call your doctor or health care professional for advice if you get a fever, chills or sore throat, or other symptoms of a cold or flu. Do not treat yourself. This drug decreases your body's ability to fight infections. Try to avoid being around people who are sick. This medicine may increase your risk to bruise or bleed. Call your doctor or health care professional if you notice any unusual bleeding. Be careful brushing and flossing your teeth or using a toothpick because you may get an infection or bleed more easily. If you have any dental work done, tell your dentist you are receiving this medicine. You may get drowsy or dizzy. Do not drive, use machinery, or  do anything that needs mental alertness until you know how this medicine affects you. Do not become pregnant while taking this medicine or for 1 year after stopping it. Women should inform their doctor if they wish to become pregnant or think they might be pregnant. Men should not father a child while taking this medicine and for 4 months after stopping it. There is a potential for serious side effects to an unborn child. Talk to your health care professional or pharmacist for more information. Do not  breast-feed an infant while taking this medicine. This medicine may interfere with the ability to have a child. This medicine has caused ovarian failure in some women. This medicine has caused reduced sperm counts in some men. You should talk with your doctor or health care professional if you are concerned about your fertility. If you are going to have surgery, tell your doctor or health care professional that you have taken this medicine. What side effects may I notice from receiving this medicine? Side effects that you should report to your doctor or health care professional as soon as possible: -allergic reactions like skin rash, itching or hives, swelling of the face, lips, or tongue -low blood counts - this medicine may decrease the number of white blood cells, red blood cells and platelets. You may be at increased risk for infections and bleeding. -signs of infection - fever or chills, cough, sore throat, pain or difficulty passing urine -signs of decreased platelets or bleeding - bruising, pinpoint red spots on the skin, black, tarry stools, blood in the urine -signs of decreased red blood cells - unusually weak or tired, fainting spells, lightheadedness -breathing problems -dark urine -dizziness -palpitations -swelling of the ankles, feet, hands -trouble passing urine or change in the amount of urine -weight gain -yellowing of the eyes or skin Side effects that usually do not require medical attention (report to your doctor or health care professional if they continue or are bothersome): -changes in nail or skin color -hair loss -missed menstrual periods -mouth sores -nausea, vomiting This list may not describe all possible side effects. Call your doctor for medical advice about side effects. You may report side effects to FDA at 1-800-FDA-1088. Where should I keep my medicine? This drug is given in a hospital or clinic and will not be stored at home. NOTE: This sheet is a  summary. It may not cover all possible information. If you have questions about this medicine, talk to your doctor, pharmacist, or health care provider.  2018 Else[employer]/Gold Standard (2012-08-08 16:22:58)  Pegfilgrastim injection What is this medicine? PEGFILGRASTIM (PEG fil gra stim) is a long-acting granulocyte colony-stimulating factor that stimulates the growth of neutrophils, a type of white blood cell important in the body's fight against infection. It is used to reduce the incidence of fever and infection in patients with certain types of cancer who are receiving chemotherapy that affects the bone marrow, and to increase survival after being exposed to high doses of radiation. This medicine may be used for other purposes; ask your health care provider or pharmacist if you have questions. COMMON BRAND NAME(S): Neulasta What should I tell my health care provider before I take this medicine? They need to know if you have any of these conditions: -kidney disease -latex allergy -ongoing radiation therapy -sickle cell disease -skin reactions to acrylic adhesives (On-Body Injector only) -an unusual or allergic reaction to pegfilgrastim, filgrastim, other medicines, foods, dyes, or preservatives -pregnant or trying to get pregnant -breast-feeding How  should I use this medicine? This medicine is for injection under the skin. If you get this medicine at home, you will be taught how to prepare and give the pre-filled syringe or how to use the On-body Injector. Refer to the patient Instructions for Use for detailed instructions. Use exactly as directed. Tell your healthcare provider immediately if you suspect that the On-body Injector may not have performed as intended or if you suspect the use of the On-body Injector resulted in a missed or partial dose. It is important that you put your used needles and syringes in a special sharps container. Do not put them in a trash can. If you do not have a  sharps container, call your pharmacist or healthcare provider to get one. Talk to your pediatrician regarding the use of this medicine in children. While this drug may be prescribed for selected conditions, precautions do apply. Overdosage: If you think you have taken too much of this medicine contact a poison control center or emergency room at once. NOTE: This medicine is only for you. Do not share this medicine with others. What if I miss a dose? It is important not to miss your dose. Call your doctor or health care professional if you miss your dose. If you miss a dose due to an On-body Injector failure or leakage, a new dose should be administered as soon as possible using a single prefilled syringe for manual use. What may interact with this medicine? Interactions have not been studied. Give your health care provider a list of all the medicines, herbs, non-prescription drugs, or dietary supplements you use. Also tell them if you smoke, drink alcohol, or use illegal drugs. Some items may interact with your medicine. This list may not describe all possible interactions. Give your health care provider a list of all the medicines, herbs, non-prescription drugs, or dietary supplements you use. Also tell them if you smoke, drink alcohol, or use illegal drugs. Some items may interact with your medicine. What should I watch for while using this medicine? You may need blood work done while you are taking this medicine. If you are going to need a MRI, CT scan, or other procedure, tell your doctor that you are using this medicine (On-Body Injector only). What side effects may I notice from receiving this medicine? Side effects that you should report to your doctor or health care professional as soon as possible: -allergic reactions like skin rash, itching or hives, swelling of the face, lips, or tongue -dizziness -fever -pain, redness, or irritation at site where injected -pinpoint red spots on the  skin -red or dark-brown urine -shortness of breath or breathing problems -stomach or side pain, or pain at the shoulder -swelling -tiredness -trouble passing urine or change in the amount of urine Side effects that usually do not require medical attention (report to your doctor or health care professional if they continue or are bothersome): -bone pain -muscle pain This list may not describe all possible side effects. Call your doctor for medical advice about side effects. You may report side effects to FDA at 1-800-FDA-1088. Where should I keep my medicine? Keep out of the reach of children. Store pre-filled syringes in a refrigerator between 2 and 8 degrees C (36 and 46 degrees F). Do not freeze. Keep in carton to protect from light. Throw away this medicine if it is left out of the refrigerator for more than 48 hours. Throw away any unused medicine after the expiration date. NOTE: This  sheet is a summary. It may not cover all possible information. If you have questions about this medicine, talk to your doctor, pharmacist, or health care provider.  2018 [employer]/Gold Standard (2016-09-20 12:58:03)   Pegfilgrastim injection What is this medicine? PEGFILGRASTIM (PEG fil gra stim) is a long-acting granulocyte colony-stimulating factor that stimulates the growth of neutrophils, a type of white blood cell important in the body's fight against infection. It is used to reduce the incidence of fever and infection in patients with certain types of cancer who are receiving chemotherapy that affects the bone marrow, and to increase survival after being exposed to high doses of radiation. This medicine may be used for other purposes; ask your health care provider or pharmacist if you have questions. COMMON BRAND NAME(S): Neulasta What should I tell my health care provider before I take this medicine? They need to know if you have any of these conditions: -kidney disease -latex allergy -ongoing  radiation therapy -sickle cell disease -skin reactions to acrylic adhesives (On-Body Injector only) -an unusual or allergic reaction to pegfilgrastim, filgrastim, other medicines, foods, dyes, or preservatives -pregnant or trying to get pregnant -breast-feeding How should I use this medicine? This medicine is for injection under the skin. If you get this medicine at home, you will be taught how to prepare and give the pre-filled syringe or how to use the On-body Injector. Refer to the patient Instructions for Use for detailed instructions. Use exactly as directed. Tell your healthcare provider immediately if you suspect that the On-body Injector may not have performed as intended or if you suspect the use of the On-body Injector resulted in a missed or partial dose. It is important that you put your used needles and syringes in a special sharps container. Do not put them in a trash can. If you do not have a sharps container, call your pharmacist or healthcare provider to get one. Talk to your pediatrician regarding the use of this medicine in children. While this drug may be prescribed for selected conditions, precautions do apply. Overdosage: If you think you have taken too much of this medicine contact a poison control center or emergency room at once. NOTE: This medicine is only for you. Do not share this medicine with others. What if I miss a dose? It is important not to miss your dose. Call your doctor or health care professional if you miss your dose. If you miss a dose due to an On-body Injector failure or leakage, a new dose should be administered as soon as possible using a single prefilled syringe for manual use. What may interact with this medicine? Interactions have not been studied. Give your health care provider a list of all the medicines, herbs, non-prescription drugs, or dietary supplements you use. Also tell them if you smoke, drink alcohol, or use illegal drugs. Some items may  interact with your medicine. This list may not describe all possible interactions. Give your health care provider a list of all the medicines, herbs, non-prescription drugs, or dietary supplements you use. Also tell them if you smoke, drink alcohol, or use illegal drugs. Some items may interact with your medicine. What should I watch for while using this medicine? You may need blood work done while you are taking this medicine. If you are going to need a MRI, CT scan, or other procedure, tell your doctor that you are using this medicine (On-Body Injector only). What side effects may I notice from receiving this medicine? Side effects that you  should report to your doctor or health care professional as soon as possible: -allergic reactions like skin rash, itching or hives, swelling of the face, lips, or tongue -dizziness -fever -pain, redness, or irritation at site where injected -pinpoint red spots on the skin -red or dark-brown urine -shortness of breath or breathing problems -stomach or side pain, or pain at the shoulder -swelling -tiredness -trouble passing urine or change in the amount of urine Side effects that usually do not require medical attention (report to your doctor or health care professional if they continue or are bothersome): -bone pain -muscle pain This list may not describe all possible side effects. Call your doctor for medical advice about side effects. You may report side effects to FDA at 1-800-FDA-1088. Where should I keep my medicine? Keep out of the reach of children. Store pre-filled syringes in a refrigerator between 2 and 8 degrees C (36 and 46 degrees F). Do not freeze. Keep in carton to protect from light. Throw away this medicine if it is left out of the refrigerator for more than 48 hours. Throw away any unused medicine after the expiration date. NOTE: This sheet is a summary. It may not cover all possible information. If you have questions about this  medicine, talk to your doctor, pharmacist, or health care provider.  2018 [employer]/Gold Standard (2016-09-20 12:58:03)

## 2017-09-30 NOTE — Assessment & Plan Note (Addendum)
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  Mammaprint high risk: Dose since Adriamycin and Cytoxan x4 followed by Taxol weekly x12 started 09/23/2017 2. adjuvant radiation therapy 3.  Followed by adjuvant antiestrogen therapy -------------------------------------------------------------------------------------------------------------------------------------- UPBEAT clinical trial ([id number]): Newly diagnosed stage I to III breast cancer patients receiving either adjuvant or neoadjuvant chemotherapy undergo cardiac MRI before treatment and at 24 months along with neurocognitive testing, exercise and disability measures at baseline 3, 12 and 24 months.  Current treatment: Cycle 2 day 1 dose dense Adriamycin and Cytoxan Echocardiogram 09/09/2017: EF 65-70% Chemo toxicities: Gloria Ware is doing well today.  Her labs have improved as compared to last week.  She will proceed with chemotherapy today.  I reviewed her anti emetics with her again in addition to how to take the claritin.  She will return in 2 weeks for labs, f/u with Dr. Lindi Adie, and cycle 3 of Adriamcyin/Cytoxan.

## 2017-09-30 NOTE — Progress Notes (Signed)
[facility] Follow up:    Gloria Ware, [place] [address]   DIAGNOSIS: Cancer Staging Malignant neoplasm of upper-inner quadrant of right breast in female, estrogen receptor positive ([place]) Staging form: Breast, AJCC 8th Edition - Clinical stage from 06/26/2017: Stage IB (cT1b, cN1, cM0, G2, ER: Positive, PR: Positive, HER2: Negative) - Unsigned Staging comments: Staged at breast conference on 9.19.18 - Pathologic: Stage IA (pT1b, pN1a, cM0, G2, ER: Positive, PR: Positive, HER2: Negative) - Signed by Gardenia Phlegm, NP on 08/21/2017   SUMMARY OF ONCOLOGIC HISTORY:   Malignant neoplasm of upper-inner quadrant of right breast in female, estrogen receptor positive ([place])   06/21/2017 Initial Diagnosis    Screening detected right breast mass and calcifications 1 cm at 1:00 position biopsy of mass and calcifications revealed invasive lobular cancer with pleomorphic features, grade 2, axillary lymph node biopsy positive for cancer, ER/PR positive HER-2 negative with a Ki-67 of 40%, T1b N1 stage IB AJCC 8      08/06/2017 Surgery    Right mastectomy: Scattered foci of invasive lobular cancer, grade 2, 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 female returns for evaluation prior to receiving cycle two of adjuvant AC.  She is tolerating it well for the most part.  She is fatigued.  She denies any other issues today.     Patient Active Problem List   Diagnosis Date Noted  . Port-A-Cath in place 09/30/2017  . Breast cancer  metastasized to multiple sites, right ([place]) 08/06/2017  . Genetic testing 06/27/2017  . Family history of breast cancer   . Breast CA ([place])   . Malignant neoplasm of upper-inner quadrant of right breast in female, estrogen receptor positive ([place]) 06/26/2017    is allergic to penicillins; sulfa antibiotics; atorvastatin; and zetia [ezetimibe].  MEDICAL HISTORY: Past Medical History:  Diagnosis Date  . Anxiety   . Breast CA ([place])   . Colon polyp 09/12/2005  . Depression   . Eczema    right forearm   . Family history of breast cancer   . Genetic testing 06/27/2017   Multi-Cancer panel (83 genes) @ [facility] - No pathogenic mutations detected  . Hearing loss   . High cholesterol   . Injury     left foot metatarsal fracture sustined 08-03-17 , treated with boot; patient  reports  improvement as of today  . Osteopenia 11/2016   T score -2.1 stable from prior DEXA  . PONV (postoperative nausea and vomiting)    post op nausea  . Tinnitus     SURGICAL HISTORY: Past Surgical History:  Procedure Laterality Date  . CARDIAC CATHETERIZATION     husband was having symtpoms and needed a stent; subsequently she states she started " to feel something"  but thinks it was " all in my head bc of my hbsand". subsequently saw same cardiologist as her husband who sent for treadmill stress that had inconcluisicve results so she was sent for cardiac catherization  which she states was " definitely negative "   . cataract surg    . DILATION AND CURETTAGE OF UTERUS    . MASTECTOMY W/ SENTINEL NODE BIOPSY Right 08/06/2017   Procedure: RIGHT MASTECTOMY WITH SENTINEL LYMPH NODE BIOPSY;  Surgeon: [name], MD;  Location: [facility];  Service: General;  Laterality: Right;  . MOUTH SURGERY    . PORTACATH PLACEMENT N/A 09/12/2017   Procedure: INSERTION PORT-A-CATH;  Surgeon: [name], MD;  Location: [facility];  Service: General;  Laterality: N/A;  . RADIOACTIVE SEED GUIDED AXILLARY  SENTINEL LYMPH NODE Right 08/06/2017   Procedure: SEED TARGETED AXILLARY LYMPH NODE EXCISION;  Surgeon: [name], MD;  Location: [facility];  Service: General;  Laterality: Right;    SOCIAL HISTORY: Social History   Socioeconomic History  . Marital status: Married    Spouse name: Not on file  . Number of children: Not on file  . Years of education: Not on file  . Highest education level: Not on file  Social Needs  . Financial resource strain: Not on file  . Food insecurity - worry: Not on file  . Food insecurity - inability: Not on file  . Transportation needs - medical: Not on file  . Transportation needs - non-medical: Not on file  Occupational History  . Not on file  Tobacco Use  . Smoking status: Never Smoker  . Smokeless tobacco: Never Used  Substance and Sexual Activity  . Alcohol use: No    Alcohol/week: 0.0 oz    Frequency: Never    Comment: WINE - rarely  . Drug use: No  . Sexual activity: Yes    Partners: Male    Birth control/protection: Post-menopausal    Comment: 1st intercourse 52 yo-1 partner  Other Topics Concern  . Not on file  Social History Narrative  . Not on file    FAMILY HISTORY: Family History  Problem Relation Age of Onset  . Breast cancer Sister 94       currently 92  . Hypertension Maternal Grandmother   . Stroke Maternal Grandmother   . Hypertension Maternal Grandfather   . Stroke Maternal Grandfather   . Breast cancer Cousin        unconfirmed in maternal first cousin  . Ovarian cancer Cousin        unconfirmed if ovarian vs. uterine in paternal first cousin    Review of Systems  Constitutional: Positive for fatigue. Negative for appetite change, chills, fever and unexpected weight change.  HENT:   Negative for hearing loss and lump/mass.   Eyes: Negative for eye problems and icterus.  Respiratory: Negative for chest tightness, cough, shortness of breath and wheezing.   Cardiovascular: Negative for chest  pain, leg swelling and palpitations.  Gastrointestinal: Negative for abdominal distention, abdominal pain, blood in stool, constipation, diarrhea, nausea and vomiting.  Musculoskeletal: Negative for arthralgias.  Skin: Negative for itching and rash.  Neurological: Negative for dizziness, extremity weakness, headaches and numbness.  Hematological: Negative for adenopathy. Does not bruise/bleed easily.  Psychiatric/Behavioral: Negative for depression. The patient is not nervous/anxious.       PHYSICAL EXAMINATION  ECOG PERFORMANCE STATUS: 1 - Symptomatic but completely ambulatory  Vitals:   09/30/17 1338  BP: (!) 141/58  Pulse: 95  Resp: 17  Temp: 98.4 F (36.9 C)  SpO2: 99%    Physical Exam  Constitutional: She is oriented to person, place, and time and well-developed, well-nourished, and in no distress.  HENT:  Head: Normocephalic and atraumatic.  Mouth/Throat: Oropharynx is clear and moist. No oropharyngeal exudate.  Eyes: Pupils are equal, round, and reactive to light. No scleral icterus.  Neck: Neck supple.  Cardiovascular: Normal rate, regular rhythm, normal heart sounds and intact distal pulses.  Pulmonary/Chest: Effort normal and breath sounds normal.  Abdominal: Soft. Bowel sounds are normal. She exhibits no distension and no mass. There is no tenderness. There is no rebound and no guarding.  Musculoskeletal: She exhibits no edema.  Lymphadenopathy:    She has no cervical adenopathy.  Neurological: She is alert and oriented to person, place, and time.  Skin: Skin is warm and dry. No rash noted. No erythema.  Psychiatric: Mood and affect normal.    LABORATORY DATA:  CBC    Component Value Date/Time   WBC 12.6 (H) 09/30/2017 1242   WBC 5.4 09/10/2017 1122   RBC 3.92 09/30/2017 1242   RBC 4.20 09/10/2017 1122   HGB 11.3 (L) 09/30/2017 1242   HCT 34.3 (L) 09/30/2017 1242   PLT 183 09/30/2017 1242   MCV 87.6 09/30/2017 1242   MCH 28.9 09/30/2017 1242   MCH  29.0 09/10/2017 1122   MCHC 33.0 09/30/2017 1242   MCHC 32.6 09/10/2017 1122   RDW 13.7 09/30/2017 1242   LYMPHSABS 1.8 09/30/2017 1242   MONOABS 0.7 09/30/2017 1242   EOSABS 0.1 09/30/2017 1242   BASOSABS 0.1 09/30/2017 1242    CMP     Component Value Date/Time   NA 138 09/30/2017 1242   K 4.0 09/30/2017 1242   CL 103 09/10/2017 1122   CO2 23 09/30/2017 1242   GLUCOSE 120 09/30/2017 1242   BUN 14.4 09/30/2017 1242   CREATININE 1.0 09/30/2017 1242   CALCIUM 8.9 09/30/2017 1242   PROT 6.9 09/30/2017 1242   ALBUMIN 3.7 09/30/2017 1242   AST 12 09/30/2017 1242   ALT 13 09/30/2017 1242   ALKPHOS 136 09/30/2017 1242   BILITOT <0.22 09/30/2017 1242   GFRNONAA >60 09/10/2017 1122   GFRAA >60 09/10/2017 1122           ASSESSMENT and PLAN:   Malignant neoplasm of upper-inner quadrant of right breast in female, estrogen receptor positive ([place]) 08/06/2017 right mastectomy: Scattered foci of invasive lobular cancer, grade 2, 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  Mammaprint high risk: Dose since Adriamycin and Cytoxan x4 followed by Taxol weekly x12 started 09/23/2017 2. adjuvant radiation therapy 3.  Followed by adjuvant antiestrogen therapy -------------------------------------------------------------------------------------------------------------------------------------- UPBEAT clinical trial ([id number]): Newly diagnosed stage I to III breast cancer patients receiving either adjuvant or neoadjuvant chemotherapy undergo cardiac MRI before treatment and at 24 months along with neurocognitive testing, exercise and disability measures at baseline 3, 12 and 24 months.  Current treatment: Cycle 2 day 1 dose dense Adriamycin and Cytoxan Echocardiogram 09/09/2017: EF 65-70% Chemo toxicities: Jorita is doing well today.  Her labs have  improved as compared to last week.  She will proceed with chemotherapy today.  I reviewed her anti emetics with her again in addition to how to take the claritin.  She will return in 2 weeks for labs, f/u with Dr. Lindi Adie, and cycle 3 of Adriamcyin/Cytoxan.          All questions were answered. The patient knows to call the clinic with any problems, questions or concerns. We  can certainly see the patient much sooner if necessary.  A total of (30) minutes of face-to-face time was spent with this patient with greater than 50% of that time in counseling and care-coordination.  This note was electronically signed. Scot Dock, NP 09/30/2017

## 2017-10-02 ENCOUNTER — Ambulatory Visit: Payer: Medicare Other | Admitting: Physical Therapy

## 2017-10-02 ENCOUNTER — Telehealth: Payer: Self-pay | Admitting: Adult Health

## 2017-10-02 DIAGNOSIS — R293 Abnormal posture: Secondary | ICD-10-CM

## 2017-10-02 DIAGNOSIS — R262 Difficulty in walking, not elsewhere classified: Secondary | ICD-10-CM | POA: Diagnosis not present

## 2017-10-02 DIAGNOSIS — M25611 Stiffness of right shoulder, not elsewhere classified: Secondary | ICD-10-CM

## 2017-10-02 DIAGNOSIS — M6281 Muscle weakness (generalized): Secondary | ICD-10-CM | POA: Diagnosis not present

## 2017-10-02 NOTE — Telephone Encounter (Signed)
No 12/24 los.  

## 2017-10-02 NOTE — Therapy (Signed)
[facility] [place], [place], [address] Phone: [phone]   Fax:  [phone]  Physical Therapy Treatment  Patient Details  Name: Gloria Ware MRN: [id number] Date of Birth: 29-Apr-1946 Referring Provider: Dr. [name]   Encounter Date: 10/02/2017  PT End of Session - 10/02/17 1522    Visit Number  5    Number of Visits  21    Date for PT Re-Evaluation  12/03/17    PT Start Time  6333    PT Stop Time  1518    PT Time Calculation (min)  46 min    Activity Tolerance  Patient tolerated treatment well    Behavior During Therapy  [facility] for tasks assessed/performed       Past Medical History:  Diagnosis Date  . Anxiety   . Breast CA ([place])   . Colon polyp 09/12/2005  . Depression   . Eczema    right forearm   . Family history of breast cancer   . Genetic testing 06/27/2017   Multi-Cancer panel (83 genes) @ [facility] - No pathogenic mutations detected  . Hearing loss   . High cholesterol   . Injury     left foot metatarsal fracture sustined 08-03-17 , treated with boot; patient  reports  improvement as of today  . Osteopenia 11/2016   T score -2.1 stable from prior DEXA  . PONV (postoperative nausea and vomiting)    post op nausea  . Tinnitus     Past Surgical History:  Procedure Laterality Date  . CARDIAC CATHETERIZATION     husband was having symtpoms and needed a stent; subsequently she states she started " to feel something"  but thinks it was " all in my head bc of my hbsand". subsequently saw same cardiologist as her husband who sent for treadmill stress that had inconcluisicve results so she was sent for cardiac catherization which she states was " definitely negative "   . cataract surg    . DILATION AND CURETTAGE OF UTERUS    . IR CV LINE INJECTION  09/30/2017  . MASTECTOMY W/ SENTINEL NODE BIOPSY Right 08/06/2017   Procedure: RIGHT MASTECTOMY WITH SENTINEL LYMPH NODE BIOPSY;  Surgeon:  [name], MD;  Location: [facility];  Service: General;  Laterality: Right;  . MOUTH SURGERY    . PORTACATH PLACEMENT N/A 09/12/2017   Procedure: INSERTION PORT-A-CATH;  Surgeon: [name], MD;  [facility];  Service: General;  Laterality: N/A;  . RADIOACTIVE SEED GUIDED AXILLARY SENTINEL LYMPH NODE Right 08/06/2017   Procedure: SEED TARGETED AXILLARY LYMPH NODE EXCISION;  Surgeon: [name], MD;  Location: [facility];  Service: General;  Laterality: Right;    There were no vitals filed for this visit.  Subjective Assessment - 10/02/17 1436    Subjective  Dr. Doran Durand said I could cancel my appointment if I needed to--I'm going to cancel, because my foot's doing fine. Has had two chemo treatments, one two days ago.  Now I'm lowing my hair.  Our next stop is [employer] and the hat store.  I'm feeling okay; I'm tired but I had a nap for an hour and I feel much better. Hasn't been doing the Theraband exercises because of feeling nauseous and tired last week, but has been doing the stretches. Blood counts were okay two days ago when she had chemo. In the morning when I get up it feels like I have a tight  bra on--it just feels tight--but after I move around some it's okay. Is anxious to get a bra and prosthesis.     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 It measured 1 cm and is located in the upper inner quadrant. 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 She underwent a right mastectomy and sentinel node biopsy on 08/06/17. She is wearing a boot on her left foot due to a fall and foot fracture 08/04/17. Her orthopedist for her foot is Dr. Doran Durand.     Currently in Pain?  No/denies                      [facility] Adult PT Treatment/Exercise - 10/02/17 0001      Knee/Hip Exercises:  Standing   Heel Raises  Both;20 reps;1 second 3 lb. weights on each ankle, hands on counter for support    Hip Abduction  Right;Left;10 reps;2 sets;Knee straight;Stengthening 3 lb. weights on ankles, hands on counter for support      Knee/Hip Exercises: Seated   Long Arc Quad  Strengthening;Right;Left;20 reps;Weights    Long Arc Quad Weight  3 lbs.    Marching  Strengthening;Right;Left;20 reps 20 each side    Marching Weights  3 lbs.      Knee/Hip Exercises: Supine   Hip Adduction Isometric  Strengthening;Both;10 reps count of 5 hold    Bridges with Ball Squeeze  Strengthening;Both;10 reps    Straight Leg Raises  AROM;Right;Left;5 reps    Other Supine Knee/Hip Exercises  from hooklying, oppposite arm from overhead to touch opposite knee (lifting knee up to hand), 10 with each arm/leg combination                   Breast Clinic Goals - 06/26/17 2119      Patient will be able to verbalize understanding of pertinent lymphedema risk reduction practices relevant to her diagnosis specifically related to skin care.   Time  1    Period  Days    Status  Achieved      Patient will be able to return demonstrate and/or verbalize understanding of the post-op home exercise program related to regaining shoulder range of motion.   Time  1    Period  Days    Status  Achieved      Patient will be able to verbalize understanding of the importance of attending the postoperative After Breast Cancer Class for further lymphedema risk reduction education and therapeutic exercise.   Time  1    Period  Days    Status  Achieved       Long Term Clinic Goals - 09/02/17 1504      CC Long Term Goal  #1   Title  Patient will perform home exercise program correctly and independently.    Time  8    Period  Weeks    Status  New      CC Long Term Goal  #2   Title  Patient will demonstrate right shoulder active flexion at >/= 130 degrees for increased ease reaching overhead.    Baseline  111  degrees today    Time  8    Period  Weeks    Status  New      CC Long Term Goal  #3   Title  Patient will demonstrate right shoulder active abduction at >/= 137 degrees for increased ease reaching overhead.    Baseline  125 degrees today    Time  8    Period  Weeks    Status  New      CC Long Term Goal  #4   Title  Patient will report she is able to participate in a regular walking program for 30 minutes, 5-6 days per week for reducing recurrence risk and better tolerance of chemotherapy.    Baseline  Unable to walk long distance with boot on.    Time  8    Period  Weeks    Status  New      CC Long Term Goal  #5   Title  Patient will verbalize understanding of risk reduction practices related to lymphedema.    Time  8    Period  Weeks    Status  New      CC Long Term Goal  #6   Title  Quick DASH score reduced to </= 4 for improved function of right arm.    Time  8    Period  Weeks    Status  New      Additional Goals   Additional Goals  Yes         Plan - 10/02/17 1523    Clinical Impression Statement  Pt. has been having fatigue and reports weakness in her legs and difficulty moving in bed.  She did well with exercises today, but she did require short seated rests between exercises.  She felt good at the end of her session.  She feels it may be difficult for her to get to the Prehab exercise class while she's doing chemo, so we agreed to change her clinic physical therapy frequency to 2x/week.    Rehab Potential  Excellent    Clinical Impairments Affecting Rehab Potential  Left foot fracture but no longer wearing boot; started chemo 09/16/17    PT Frequency  2x / week    PT Duration  8 weeks    PT Treatment/Interventions  Therapeutic exercise;Patient/family education;Balance training;Neuromuscular re-education;Gait training;ADLs/Self Care Home Management;Therapeutic activities;Functional mobility training;Manual techniques;Passive range of motion;Scar mobilization    PT  Next Visit Plan  Cont Nustep and review HEP prn; cont Rt shoulder ROM and LE strength; rest breaks as needed between appts.    PT Home Exercise Plan  Seated LE strngth and supine dowel exercises, supine scapular exercises    Consulted and Agree with Plan of Care  Patient       Patient will benefit from skilled therapeutic intervention in order to improve the following deficits and impairments:  Postural dysfunction, Decreased knowledge of precautions, Pain, Impaired UE functional use, Decreased range of motion, Difficulty walking, Decreased activity tolerance, Decreased strength  Visit Diagnosis: Muscle weakness (generalized) - Plan: PT plan of care cert/re-cert  Abnormal posture - Plan: PT plan of care cert/re-cert  Stiffness of right shoulder, not elsewhere classified - Plan: PT plan of care cert/re-cert  Difficulty in walking, not elsewhere classified - Plan: PT plan of care cert/re-cert     Problem List Patient Active Problem List   Diagnosis Date Noted  . Port-A-Cath in place 09/30/2017  . Breast cancer metastasized to multiple sites, right ([place]) 08/06/2017  . Genetic testing 06/27/2017  . Family history of breast cancer   . Breast CA ([place])   . Malignant neoplasm of upper-inner quadrant of right breast in female, estrogen receptor  positive ([place]) 06/26/2017    SALISBURY,DONNA 10/02/2017, 3:28 PM  [place], [place], [address] Phone: [phone]   Fax:  [phone]  Name: Gloria Ware MRN: [id number] Date of Birth: 13-Aug-1946  Serafina Royals, PT 10/02/17 3:29 PM

## 2017-10-05 ENCOUNTER — Other Ambulatory Visit: Payer: Self-pay

## 2017-10-05 ENCOUNTER — Encounter (HOSPITAL_COMMUNITY): Payer: Self-pay

## 2017-10-05 ENCOUNTER — Emergency Department (HOSPITAL_COMMUNITY): Payer: Medicare Other

## 2017-10-05 ENCOUNTER — Emergency Department (HOSPITAL_COMMUNITY)
Admission: EM | Admit: 2017-10-05 | Discharge: 2017-10-05 | Disposition: A | Discharge status: Home / Self Care | Payer: Medicare Other | Source: Home / Self Care | Attending: Emergency Medicine | Admitting: Emergency Medicine

## 2017-10-05 DIAGNOSIS — M5440 Lumbago with sciatica, unspecified side: Secondary | ICD-10-CM | POA: Diagnosis not present

## 2017-10-05 DIAGNOSIS — Z7982 Long term (current) use of aspirin: Secondary | ICD-10-CM | POA: Insufficient documentation

## 2017-10-05 DIAGNOSIS — R7989 Other specified abnormal findings of blood chemistry: Secondary | ICD-10-CM | POA: Diagnosis not present

## 2017-10-05 DIAGNOSIS — Z79899 Other long term (current) drug therapy: Secondary | ICD-10-CM | POA: Insufficient documentation

## 2017-10-05 DIAGNOSIS — E860 Dehydration: Secondary | ICD-10-CM | POA: Diagnosis not present

## 2017-10-05 DIAGNOSIS — R0602 Shortness of breath: Secondary | ICD-10-CM | POA: Diagnosis not present

## 2017-10-05 DIAGNOSIS — G4700 Insomnia, unspecified: Secondary | ICD-10-CM | POA: Diagnosis not present

## 2017-10-05 DIAGNOSIS — M545 Low back pain: Secondary | ICD-10-CM | POA: Diagnosis not present

## 2017-10-05 DIAGNOSIS — F419 Anxiety disorder, unspecified: Secondary | ICD-10-CM

## 2017-10-05 DIAGNOSIS — F329 Major depressive disorder, single episode, unspecified: Secondary | ICD-10-CM | POA: Diagnosis not present

## 2017-10-05 DIAGNOSIS — C50211 Malignant neoplasm of upper-inner quadrant of right female breast: Secondary | ICD-10-CM | POA: Diagnosis not present

## 2017-10-05 DIAGNOSIS — D701 Agranulocytosis secondary to cancer chemotherapy: Secondary | ICD-10-CM | POA: Diagnosis not present

## 2017-10-05 LAB — TROPONIN I: Troponin I: <0.03 ng/mL (ref <0.03)

## 2017-10-05 LAB — URINALYSIS, ROUTINE W REFLEX MICROSCOPIC
BILIRUBIN URINE: NEGATIVE
Bacteria, UA: NONE SEEN
Glucose, UA: NEGATIVE mg/dL
Ketones, ur: 5 mg/dL — AB
Leukocytes, UA: NEGATIVE
NITRITE: NEGATIVE
PH: 6 (ref 5.0–8.0)
Protein, ur: NEGATIVE mg/dL
SPECIFIC GRAVITY, URINE: 1.008 (ref 1.005–1.030)

## 2017-10-05 LAB — COMPREHENSIVE METABOLIC PANEL
ALT: 18 U/L (ref 14–54)
AST: 22 U/L (ref 15–41)
Albumin: 3.9 g/dL (ref 3.5–5.0)
Alkaline Phosphatase: 166 U/L — ABNORMAL HIGH (ref 38–126)
Anion gap: 9 (ref 5–15)
BUN: 18 mg/dL (ref 6–20)
CO2: 21 mmol/L — ABNORMAL LOW (ref 22–32)
Calcium: 9.1 mg/dL (ref 8.9–10.3)
Chloride: 101 mmol/L (ref 101–111)
Creatinine, Ser: 0.73 mg/dL (ref 0.44–1.00)
GFR calc Af Amer: >60 mL/min (ref >60)
GFR calc non Af Amer: >60 mL/min (ref >60)
Glucose, Bld: 114 mg/dL — ABNORMAL HIGH (ref 65–99)
Potassium: 3.9 mmol/L (ref 3.5–5.1)
Sodium: 131 mmol/L — ABNORMAL LOW (ref 135–145)
Total Bilirubin: 0.8 mg/dL (ref 0.3–1.2)
Total Protein: 7 g/dL (ref 6.5–8.1)

## 2017-10-05 LAB — CBC WITH DIFFERENTIAL/PLATELET
Basophils Absolute: 0.1 10*3/uL (ref 0.0–0.1)
Basophils Relative: 1 %
Eosinophils Absolute: 0 10*3/uL (ref 0.0–0.7)
Eosinophils Relative: 0 %
HCT: 33.5 % — ABNORMAL LOW (ref 36.0–46.0)
Hemoglobin: 11.5 g/dL — ABNORMAL LOW (ref 12.0–15.0)
Lymphocytes Relative: 6 %
Lymphs Abs: 0.6 10*3/uL — ABNORMAL LOW (ref 0.7–4.0)
MCH: 29.6 pg (ref 26.0–34.0)
MCHC: 34.3 g/dL (ref 30.0–36.0)
MCV: 86.3 fL (ref 78.0–100.0)
Monocytes Absolute: 0.1 10*3/uL (ref 0.1–1.0)
Monocytes Relative: 1 %
Neutro Abs: 9.1 10*3/uL — ABNORMAL HIGH (ref 1.7–7.7)
Neutrophils Relative %: 92 %
Platelets: 185 10*3/uL (ref 150–400)
RBC: 3.88 MIL/uL (ref 3.87–5.11)
RDW: 13.6 % (ref 11.5–15.5)
WBC: 9.9 10*3/uL (ref 4.0–10.5)

## 2017-10-05 LAB — BRAIN NATRIURETIC PEPTIDE: B NATRIURETIC PEPTIDE 5: 397.7 pg/mL — AB (ref 0.0–100.0)

## 2017-10-05 LAB — I-STAT CG4 LACTIC ACID, ED: Lactic Acid, Venous: 1.64 mmol/L (ref 0.5–1.9)

## 2017-10-05 LAB — LIPASE, BLOOD: Lipase: 21 U/L (ref 11–51)

## 2017-10-05 LAB — D-DIMER, QUANTITATIVE (NOT AT ARMC): D DIMER QUANT: 1.36 ug{FEU}/mL — AB (ref 0.00–0.50)

## 2017-10-05 MED ORDER — SODIUM CHLORIDE 0.9 % IJ SOLN
INTRAMUSCULAR | Status: AC
  Administered 2017-10-05: 16:00:00
  Filled 2017-10-05: qty 50

## 2017-10-05 MED ORDER — OXYCODONE-ACETAMINOPHEN 5-325 MG PO TABS: 1.0000 | ORAL_TABLET | ORAL | 0 refills | Status: DC | PRN

## 2017-10-05 MED ORDER — ONDANSETRON HCL 4 MG/2ML IJ SOLN
4.0000 mg | Freq: Once | INTRAMUSCULAR | Status: AC
  Administered 2017-10-05: 4 mg via INTRAVENOUS
  Filled 2017-10-05: qty 2

## 2017-10-05 MED ORDER — IOPAMIDOL (ISOVUE-370) INJECTION 76%
INTRAVENOUS | Status: AC
  Administered 2017-10-05: 100 mL via INTRAVENOUS
  Filled 2017-10-05: qty 100

## 2017-10-05 MED ORDER — LORAZEPAM 1 MG PO TABS: 1.0000 mg | ORAL_TABLET | Freq: Four times a day (QID) | ORAL | 0 refills | Status: DC | PRN

## 2017-10-05 MED ORDER — MORPHINE SULFATE (PF) 4 MG/ML IV SOLN
4.0000 mg | Freq: Once | INTRAVENOUS | Status: AC
  Administered 2017-10-05: 4 mg via INTRAVENOUS
  Filled 2017-10-05: qty 1

## 2017-10-05 MED ORDER — SODIUM CHLORIDE 0.9 % IV BOLUS (SEPSIS)
1000.0000 mL | Freq: Once | INTRAVENOUS | Status: AC
  Administered 2017-10-05: 1000 mL via INTRAVENOUS

## 2017-10-05 MED ORDER — LORAZEPAM 2 MG/ML IJ SOLN
1.0000 mg | Freq: Once | INTRAMUSCULAR | Status: AC
  Administered 2017-10-05: 1 mg via INTRAVENOUS
  Filled 2017-10-05: qty 1

## 2017-10-05 NOTE — ED Notes (Signed)
Bed: WA02 Expected date:  Expected time:  Means of arrival:  Comments: 

## 2017-10-05 NOTE — ED Triage Notes (Signed)
She c/o low back pain since receiving chemo and Neulasta 5-6 days ago. She further reports shortness of breath since yesterday evening, which persists. She has a port, however, she requests we use her right arm for IV/blood draws.

## 2017-10-05 NOTE — ED Provider Notes (Signed)
[facility] DEPT Provider Note   CSN: [id number] Arrival date & time: 10/05/17  2951     History   Chief Complaint Chief Complaint  Patient presents with  . Chemotherapy  . Shortness of Breath  . Back Pain    HPI   Blood pressure (!) 121/52, pulse (!) 122, temperature 98.2 F (36.8 C), temperature source Oral, resp. rate (!) 21, last menstrual period 07/30/1998, SpO2 97 %.  Gloria Ware is a 71 y.o. female complaining of shortness of breath, anxiousness, low back pain and difficulty sleeping last night.  Patient has recently initiated chemotherapy for breast cancer (oncologist is [place]) patient has history of anxiety and took Ativan and Tylenol last night with little relief.  She denies any fevers, chills, chest pains, palpitations, increasing peripheral edema, history of DVT/PE, calf pain or leg swelling.  She states that she has anxiety and this does feel like an anxiety attack for her.  She has decreased appetite but she is been trying to drink water aggressively.  She has a history of chronic back pain.  She states that the pain that she has is typical for her chronic pain.  She denies any incontinence, numbness or weakness.  Past Medical History:  Diagnosis Date  . Anxiety   . Breast CA ([place])   . Colon polyp 09/12/2005  . Depression   . Eczema    right forearm   . Family history of breast cancer   . Genetic testing 06/27/2017   Multi-Cancer panel (83 genes) @ [facility] - No pathogenic mutations detected  . Hearing loss   . High cholesterol   . Injury     left foot metatarsal fracture sustined 08-03-17 , treated with boot; patient  reports  improvement as of today  . Osteopenia 11/2016   T score -2.1 stable from prior DEXA  . PONV (postoperative nausea and vomiting)    post op nausea  . Tinnitus     Patient Active Problem List   Diagnosis Date Noted  . Port-A-Cath in place 09/30/2017  . Breast cancer metastasized to multiple sites,  right ([place]) 08/06/2017  . Genetic testing 06/27/2017  . Family history of breast cancer   . Breast CA ([place])   . Malignant neoplasm of upper-inner quadrant of right breast in female, estrogen receptor positive ([place]) 06/26/2017    Past Surgical History:  Procedure Laterality Date  . CARDIAC CATHETERIZATION     husband was having symtpoms and needed a stent; subsequently she states she started " to feel something"  but thinks it was " all in my head bc of my hbsand". subsequently saw same cardiologist as her husband who sent for treadmill stress that had inconcluisicve results so she was sent for cardiac catherization which she states was " definitely negative "   . cataract surg    . DILATION AND CURETTAGE OF UTERUS    . IR CV LINE INJECTION  09/30/2017  . MASTECTOMY W/ SENTINEL NODE BIOPSY Right 08/06/2017   Procedure: RIGHT MASTECTOMY WITH SENTINEL LYMPH NODE BIOPSY;  Surgeon: [name], MD;  Location: [facility];  Service: General;  Laterality: Right;  . MOUTH SURGERY    . PORTACATH PLACEMENT N/A 09/12/2017   Procedure: INSERTION PORT-A-CATH;  Surgeon: [name], MD;  Location: [facility];  Service: General;  Laterality: N/A;  . RADIOACTIVE SEED GUIDED AXILLARY SENTINEL LYMPH NODE Right 08/06/2017   Procedure: SEED TARGETED AXILLARY LYMPH NODE EXCISION;  Surgeon: [name], MD;  Location: [facility];  Service: General;  Laterality: Right;    OB History    Gravida Para Term Preterm AB Living   2 2       2    SAB TAB Ectopic Multiple Live Births                   Home Medications    Prior to Admission medications   Medication Sig Start Date End Date Taking? Authorizing Provider  acetaminophen (TYLENOL) 500 MG tablet Take 500-1,000 mg by mouth every 6 (six) hours as needed for moderate pain or headache.    Yes [provider]  aspirin 81 MG tablet Take 81 mg by mouth daily.     Yes [provider]  Calcium Carbonate-Vitamin D  (CALTRATE 600+D PO) Take 1 tablet by mouth 2 (two) times daily.   Yes [provider]  dexamethasone (DECADRON) 4 MG tablet Take 4 mg by mouth daily. TAKE 1 TABLET DAILY FOR 2 DAYS AFTER EACH CHEMO TREATMENT   Yes [provider]  lidocaine-prilocaine (EMLA) cream Apply to affected area once 09/02/17  Yes Nicholas Lose, MD  loratadine (CLARITIN) 10 MG tablet Take 10 mg by mouth as needed for allergies.   Yes [provider]  pegfilgrastim (NEULASTA ONPRO) 6 MG/0.6ML injection Inject 6 mg into the skin once. Inject via provided programmed delivery device. AFTER CHEMO EVERY OTHER MONDAY   Yes [provider]  prochlorperazine (COMPAZINE) 10 MG tablet Take 1 tablet (10 mg total) by mouth every 6 (six) hours as needed (Nausea or vomiting). 09/20/17  Yes Nicholas Lose, MD  sertraline (ZOLOFT) 100 MG tablet Take 200 mg by mouth daily.    Yes [provider]  anastrozole (ARIMIDEX) 1 MG tablet Take 1 tablet (1 mg total) by mouth daily. Patient not taking: Reported on 10/05/2017 08/28/17   Nicholas Lose, MD  LORazepam (ATIVAN) 1 MG tablet Take 1 tablet (1 mg total) by mouth every 6 (six) hours as needed for anxiety or sleep. 10/05/17   Nozomi Mettler, Elmyra Ricks, PA-C  methocarbamol (ROBAXIN) 500 MG tablet Take 1 tablet (500 mg total) by mouth every 6 (six) hours as needed for muscle spasms. Patient not taking: Reported on 10/02/2017 08/06/17   [name], MD  ondansetron (ZOFRAN) 8 MG tablet Take 1 tablet (8 mg total) by mouth 2 (two) times daily as needed. Start on the third day after chemotherapy. Patient not taking: Reported on 10/02/2017 09/02/17   Nicholas Lose, MD  oxyCODONE-acetaminophen (PERCOCET) 5-325 MG tablet Take 1 tablet by mouth every 4 (four) hours as needed. 10/05/17   Mackinzee Roszak, Elmyra Ricks, PA-C    Family History Family History  Problem Relation Age of Onset  . Breast cancer Sister 50       currently 55  . Hypertension Maternal Grandmother   .  Stroke Maternal Grandmother   . Hypertension Maternal Grandfather   . Stroke Maternal Grandfather   . Breast cancer Cousin        unconfirmed in maternal first cousin  . Ovarian cancer Cousin        unconfirmed if ovarian vs. uterine in paternal first cousin    Social History Social History   Tobacco Use  . Smoking status: Never Smoker  . Smokeless tobacco: Never Used  Substance Use Topics  . Alcohol use: No    Alcohol/week: 0.0 oz    Frequency: Never    Comment: WINE - rarely  . Drug use: No  Allergies   Penicillins; Sulfa antibiotics; Atorvastatin; and Zetia [ezetimibe]   Review of Systems Review of Systems  A complete review of systems was obtained and all systems are negative except as noted in the HPI and PMH.    Physical Exam Updated Vital Signs BP (!) 144/71 (BP Location: Right Arm)   Pulse (!) 116   Temp 98.2 F (36.8 C) (Oral)   Resp 17   LMP 07/30/1998   SpO2 100%   Physical Exam  Constitutional: She is oriented to person, place, and time. She appears well-developed and well-nourished. No distress.  HENT:  Head: Normocephalic.  Mouth/Throat: Oropharynx is clear and moist.  Eyes: Conjunctivae are normal.  Neck: Normal range of motion. No JVD present. No tracheal deviation present.  Cardiovascular: Regular rhythm and intact distal pulses.  Tachycardic Regular  Pulmonary/Chest: Effort normal and breath sounds normal. No stridor. No respiratory distress. She has no wheezes. She has no rales. She exhibits no tenderness.  Abdominal: Soft. She exhibits no distension and no mass. There is no tenderness. There is no rebound and no guarding.  Musculoskeletal: Normal range of motion. She exhibits no edema or tenderness.  No calf asymmetry, superficial collaterals, palpable cords, edema, Homans sign negative bilaterally.    Neurological: She is alert and oriented to person, place, and time.  Skin: Skin is warm. She is not diaphoretic.  Psychiatric:    Mildly anxious  Nursing note and vitals reviewed.    ED Treatments / Results  Labs (all labs ordered are listed, but only abnormal results are displayed) Labs Reviewed  CBC WITH DIFFERENTIAL/PLATELET - Abnormal; Notable for the following components:      Result Value   Hemoglobin 11.5 (*)    HCT 33.5 (*)    Neutro Abs 9.1 (*)    Lymphs Abs 0.6 (*)    All other components within normal limits  COMPREHENSIVE METABOLIC PANEL - Abnormal; Notable for the following components:   Sodium 131 (*)    CO2 21 (*)    Glucose, Bld 114 (*)    Alkaline Phosphatase 166 (*)    All other components within normal limits  D-DIMER, QUANTITATIVE (NOT AT [facility]) - Abnormal; Notable for the following components:   D-Dimer, Quant 1.36 (*)    All other components within normal limits  BRAIN NATRIURETIC PEPTIDE - Abnormal; Notable for the following components:   B Natriuretic Peptide 397.7 (*)    All other components within normal limits  URINALYSIS, ROUTINE W REFLEX MICROSCOPIC - Abnormal; Notable for the following components:   Color, Urine STRAW (*)    Hgb urine dipstick SMALL (*)    Ketones, ur 5 (*)    Squamous Epithelial / LPF 0-5 (*)    All other components within normal limits  LIPASE, BLOOD  TROPONIN I  I-STAT CG4 LACTIC ACID, ED    EKG  EKG Interpretation  Date/Time:  Saturday October 05 2017 11:24:53 EST Ventricular Rate:  102 PR Interval:    QRS Duration: 76 QT Interval:  347 QTC Calculation: 452 R Axis:   75 Text Interpretation:  Sinus tachycardia Borderline T abnormalities, anterior leads No old tracing to compare Confirmed by Virgel Manifold [phone]) on 10/05/2017 2:10:32 PM       Radiology Dg Chest 2 View  Result Date: 10/05/2017 CLINICAL DATA:  Shortness of breath and back discomfort. History of breast cancer, currently on chemotherapy. EXAM: CHEST  2 VIEW COMPARISON:  Chest x-ray dated September 12, 2017. FINDINGS: Unchanged left chest wall port  catheter with the tip in  the distal superior vena cava. The cardiomediastinal silhouette is normal in size. Normal pulmonary vascularity. No focal consolidation, pleural effusion, or pneumothorax. No acute osseous abnormality. Unchanged surgical clips in the right axilla. IMPRESSION: No active cardiopulmonary disease. Electronically Signed   By: Titus Dubin M.D.   On: 10/05/2017 10:36   Dg Lumbar Spine Complete  Result Date: 10/05/2017 CLINICAL DATA:  She c/o low back pain since receiving chemo for breast cancer and Neulasta 5-6 days ago. She further reports shortness of breath since yesterday evening, which persists. H/o osteopenia. EXAM: LUMBAR SPINE - COMPLETE 4+ VIEW COMPARISON:  Abdominopelvic CT of 09/05/2017 FINDINGS: Mild osteopenia. Five lumbar type vertebral bodies. Maintenance of vertebral body height and alignment. Loss of intervertebral disc height, primarily at the lumbosacral junction. Facet arthropathy at L4-5 and L5-S1. No focal osseous lesion. IMPRESSION: Lumbosacral spondylosis, without acute superimposed process. Electronically Signed   By: Abigail Miyamoto M.D.   On: 10/05/2017 12:31   Ct Angio Chest Pe W And/or Wo Contrast  Result Date: 10/05/2017 CLINICAL DATA:  Shortness of breath. Positive D-dimer. Suspected pulmonary embolism. Currently undergoing chemotherapy for right breast carcinoma. EXAM: CT ANGIOGRAPHY CHEST WITH CONTRAST TECHNIQUE: Multidetector CT imaging of the chest was performed using the standard protocol during bolus administration of intravenous contrast. Multiplanar CT image reconstructions and MIPs were obtained to evaluate the vascular anatomy. CONTRAST:  <See Chart> ISOVUE-370 IOPAMIDOL (ISOVUE-370) INJECTION 76% COMPARISON:  None. FINDINGS: Cardiovascular: Satisfactory opacification of pulmonary arteries noted, and no pulmonary emboli identified. No evidence of thoracic aortic dissection or aneurysm. Mediastinum/Nodes: No masses or pathologically enlarged lymph nodes identified. Prior  right mastectomy and axillary lymph node dissection. No axillary lymphadenopathy identified. Lungs/Pleura: No pulmonary mass, infiltrate, or effusion. Upper abdomen: No acute findings. Musculoskeletal: No suspicious bone lesions identified. Review of the MIP images confirms the above findings. IMPRESSION: Negative. No evidence of pulmonary embolism or other active disease within the thorax. Electronically Signed   By: Earle Gell M.D.   On: 10/05/2017 15:58    Procedures Procedures (including critical care time)  Medications Ordered in ED Medications  sodium chloride 0.9 % bolus 1,000 mL (0 mLs Intravenous Stopped 10/05/17 1343)  morphine 4 MG/ML injection 4 mg (4 mg Intravenous Given 10/05/17 1127)  ondansetron (ZOFRAN) injection 4 mg (4 mg Intravenous Given 10/05/17 1127)  LORazepam (ATIVAN) injection 1 mg (1 mg Intravenous Given 10/05/17 1442)  sodium chloride 0.9 % injection (  Given by Other 10/05/17 1553)  iopamidol (ISOVUE-370) 76 % injection (100 mLs Intravenous Contrast Given 10/05/17 1525)     Initial Impression / Assessment and Plan / ED Course  I have reviewed the triage vital signs and the nursing notes.  Pertinent labs & imaging results that were available during my care of the patient were reviewed by me and considered in my medical decision making (see chart for details).     Vitals:   10/05/17 1148 10/05/17 1500 10/05/17 1515 10/05/17 1619  BP: (!) 112/98 (!) 107/52 111/63 (!) 144/71  Pulse: (!) 106 (!) 111 (!) 107 (!) 116  Resp: 16 19 17 17   Temp:      TempSrc:      SpO2: 100% 94% 100% 100%    Medications  sodium chloride 0.9 % bolus 1,000 mL (0 mLs Intravenous Stopped 10/05/17 1343)  morphine 4 MG/ML injection 4 mg (4 mg Intravenous Given 10/05/17 1127)  ondansetron (ZOFRAN) injection 4 mg (4 mg Intravenous Given 10/05/17 1127)  LORazepam (ATIVAN)  injection 1 mg (1 mg Intravenous Given 10/05/17 1442)  sodium chloride 0.9 % injection (  Given by Other 10/05/17  1553)  iopamidol (ISOVUE-370) 76 % injection (100 mLs Intravenous Contrast Given 10/05/17 1525)    Gloria Ware is 71 y.o. female presenting with shortness of breath, she also is reporting some difficulty sleeping last night.  Patient with active breast cancer undergoing chemotherapy and she is also getting Neulasta shots.  Patient is tachycardic, regular on my exam.  This is a sinus tachycardia by EKG.  In this patient with active cancer I am concerned for a PE however, patient is extremely anxious and I think it is more likely related to her anxiety.  I will obtain a d-dimer and if or hypotension no chest pain, hemoptysis.  Patient has exacerbation of her chronic low back pain with a nonfocal neurologic exam however will obtain plain films to evaluate for pathologic fracture.  There is been a significant delay in obtaining blood work because the blood work was not found in the lab.  D-dimer is borderline elevated, troponin negative, patient is pending CTA.  PA negative, patient is feeling much improved after Ativan.  I think this is a combination of her back pain with a normal anxiety stress response to her newly diagnosed and treated cancer.  I will write her a prescription for Percocet and Ativan as needed.  This is a shared visit with the attending physician who personally evaluated the patient and agrees with the care plan.   Evaluation does not show pathology that would require ongoing emergent intervention or inpatient treatment. Pt is hemodynamically stable and mentating appropriately. Discussed findings and plan with patient/guardian, who agrees with care plan. All questions answered. Return precautions discussed and outpatient follow up given.    Final Clinical Impressions(s) / ED Diagnoses   Final diagnoses:  Acute low back pain, unspecified back pain laterality, with sciatica presence unspecified  Anxiety    ED Discharge Orders        Ordered    LORazepam (ATIVAN) 1 MG tablet   Every 6 hours PRN     10/05/17 1619    oxyCODONE-acetaminophen (PERCOCET) 5-325 MG tablet  Every 4 hours PRN     10/05/17 1619       Shaquanna Lycan, Charna Elizabeth 10/05/17 1621    Virgel Manifold, MD 10/08/17 [phone]

## 2017-10-05 NOTE — ED Notes (Signed)
Bed: [facility] Expected date:  Expected time:  Means of arrival:  Comments: 

## 2017-10-05 NOTE — Discharge Instructions (Signed)
Take percocet for breakthrough pain, do not drink alcohol, drive, care for children or do other critical tasks while taking percocet. ° °Please follow with your primary care doctor in the next 2 days for a check-up. They must obtain records for further management.  ° °Do not hesitate to return to the Emergency Department for any new, worsening or concerning symptoms.  ° °

## 2017-10-07 ENCOUNTER — Encounter (HOSPITAL_COMMUNITY): Payer: Self-pay | Admitting: *Deleted

## 2017-10-07 ENCOUNTER — Other Ambulatory Visit: Payer: Self-pay

## 2017-10-07 ENCOUNTER — Inpatient Hospital Stay (HOSPITAL_COMMUNITY)
Admission: AD | Admit: 2017-10-07 | Discharge: 2017-10-09 | DRG: 599 | Disposition: A | Discharge status: Home / Self Care | Payer: Medicare Other | Source: Ambulatory Visit | Attending: Hematology and Oncology | Admitting: Hematology and Oncology

## 2017-10-07 ENCOUNTER — Ambulatory Visit (HOSPITAL_BASED_OUTPATIENT_CLINIC_OR_DEPARTMENT_OTHER): Payer: Medicare Other | Admitting: Hematology and Oncology

## 2017-10-07 ENCOUNTER — Encounter: Payer: Self-pay | Admitting: *Deleted

## 2017-10-07 DIAGNOSIS — F418 Other specified anxiety disorders: Secondary | ICD-10-CM | POA: Diagnosis not present

## 2017-10-07 DIAGNOSIS — M545 Low back pain: Secondary | ICD-10-CM | POA: Diagnosis not present

## 2017-10-07 DIAGNOSIS — Z95828 Presence of other vascular implants and grafts: Secondary | ICD-10-CM

## 2017-10-07 DIAGNOSIS — C50911 Malignant neoplasm of unspecified site of right female breast: Secondary | ICD-10-CM

## 2017-10-07 DIAGNOSIS — Z86010 Personal history of colonic polyps: Secondary | ICD-10-CM

## 2017-10-07 DIAGNOSIS — E860 Dehydration: Secondary | ICD-10-CM | POA: Diagnosis not present

## 2017-10-07 DIAGNOSIS — C773 Secondary and unspecified malignant neoplasm of axilla and upper limb lymph nodes: Secondary | ICD-10-CM | POA: Diagnosis not present

## 2017-10-07 DIAGNOSIS — G4700 Insomnia, unspecified: Secondary | ICD-10-CM

## 2017-10-07 DIAGNOSIS — M8580 Other specified disorders of bone density and structure, unspecified site: Secondary | ICD-10-CM | POA: Diagnosis present

## 2017-10-07 DIAGNOSIS — F329 Major depressive disorder, single episode, unspecified: Secondary | ICD-10-CM | POA: Diagnosis present

## 2017-10-07 DIAGNOSIS — R531 Weakness: Secondary | ICD-10-CM | POA: Diagnosis present

## 2017-10-07 DIAGNOSIS — F419 Anxiety disorder, unspecified: Secondary | ICD-10-CM

## 2017-10-07 DIAGNOSIS — D701 Agranulocytosis secondary to cancer chemotherapy: Secondary | ICD-10-CM | POA: Diagnosis present

## 2017-10-07 DIAGNOSIS — Z170 Estrogen receptor positive status [ER+]: Secondary | ICD-10-CM | POA: Diagnosis not present

## 2017-10-07 DIAGNOSIS — Z880 Allergy status to penicillin: Secondary | ICD-10-CM | POA: Diagnosis not present

## 2017-10-07 DIAGNOSIS — H9190 Unspecified hearing loss, unspecified ear: Secondary | ICD-10-CM | POA: Diagnosis present

## 2017-10-07 DIAGNOSIS — Z9011 Acquired absence of right breast and nipple: Secondary | ICD-10-CM

## 2017-10-07 DIAGNOSIS — R5383 Other fatigue: Secondary | ICD-10-CM | POA: Diagnosis not present

## 2017-10-07 DIAGNOSIS — C50211 Malignant neoplasm of upper-inner quadrant of right female breast: Secondary | ICD-10-CM

## 2017-10-07 DIAGNOSIS — F5101 Primary insomnia: Secondary | ICD-10-CM

## 2017-10-07 DIAGNOSIS — Z888 Allergy status to other drugs, medicaments and biological substances status: Secondary | ICD-10-CM

## 2017-10-07 DIAGNOSIS — M62830 Muscle spasm of back: Secondary | ICD-10-CM

## 2017-10-07 DIAGNOSIS — M47817 Spondylosis without myelopathy or radiculopathy, lumbosacral region: Secondary | ICD-10-CM | POA: Diagnosis present

## 2017-10-07 DIAGNOSIS — T451X5A Adverse effect of antineoplastic and immunosuppressive drugs, initial encounter: Secondary | ICD-10-CM | POA: Diagnosis present

## 2017-10-07 DIAGNOSIS — R627 Adult failure to thrive: Secondary | ICD-10-CM | POA: Diagnosis not present

## 2017-10-07 DIAGNOSIS — Z882 Allergy status to sulfonamides status: Secondary | ICD-10-CM | POA: Diagnosis not present

## 2017-10-07 MED ORDER — OXYCODONE-ACETAMINOPHEN 5-325 MG PO TABS
ORAL_TABLET | ORAL | Status: AC
  Filled 2017-10-07: qty 1

## 2017-10-07 MED ORDER — OXYCODONE-ACETAMINOPHEN 5-325 MG PO TABS
1.0000 | ORAL_TABLET | Freq: Once | ORAL | Status: AC
Start: 2017-10-07 — End: 2017-10-07
  Administered 2017-10-07: 1 via ORAL

## 2017-10-07 MED ORDER — HYDROCORTISONE 2.5 % RE CREA: 1.0000 "application " | TOPICAL_CREAM | Freq: Two times a day (BID) | RECTAL | Status: DC | PRN

## 2017-10-07 MED ORDER — SODIUM CHLORIDE 0.9 % IV SOLN
INTRAVENOUS | Status: DC
  Administered 2017-10-07 – 2017-10-08 (×2): via INTRAVENOUS

## 2017-10-07 MED ORDER — ALUM & MAG HYDROXIDE-SIMETH 200-200-20 MG/5ML PO SUSP: 60.0000 mL | ORAL | Status: DC | PRN

## 2017-10-07 MED ORDER — LORAZEPAM 2 MG/ML IJ SOLN
INTRAMUSCULAR | Status: AC
  Filled 2017-10-07: qty 1

## 2017-10-07 MED ORDER — PROCHLORPERAZINE MALEATE 10 MG PO TABS
10.0000 mg | ORAL_TABLET | Freq: Four times a day (QID) | ORAL | Status: DC | PRN
Start: 2017-10-07 — End: 2017-10-09
  Filled 2017-10-07: qty 1

## 2017-10-07 MED ORDER — SODIUM CHLORIDE 0.9 % IV SOLN
8.0000 mg | Freq: Three times a day (TID) | INTRAVENOUS | Status: DC | PRN
Start: 2017-10-07 — End: 2017-10-09
  Filled 2017-10-07: qty 4

## 2017-10-07 MED ORDER — PROMETHAZINE HCL 25 MG/ML IJ SOLN
25.0000 mg | Freq: Once | INTRAMUSCULAR | Status: AC
  Administered 2017-10-07: 25 mg via INTRAVENOUS
  Filled 2017-10-07: qty 1

## 2017-10-07 MED ORDER — ONDANSETRON HCL 4 MG/2ML IJ SOLN
4.0000 mg | Freq: Three times a day (TID) | INTRAMUSCULAR | Status: DC | PRN
  Administered 2017-10-08: 4 mg via INTRAVENOUS
  Filled 2017-10-07: qty 2

## 2017-10-07 MED ORDER — SERTRALINE HCL 100 MG PO TABS
200.0000 mg | ORAL_TABLET | Freq: Every day | ORAL | Status: DC
Start: 2017-10-08 — End: 2017-10-09
  Administered 2017-10-08 – 2017-10-09 (×2): 200 mg via ORAL
  Filled 2017-10-07 (×2): qty 2

## 2017-10-07 MED ORDER — OXYCODONE-ACETAMINOPHEN 5-325 MG PO TABS
1.0000 | ORAL_TABLET | ORAL | Status: DC | PRN
  Administered 2017-10-07 – 2017-10-09 (×7): 1 via ORAL
  Filled 2017-10-07 (×9): qty 1

## 2017-10-07 MED ORDER — GUAIFENESIN-DM 100-10 MG/5ML PO SYRP: 10.0000 mL | ORAL_SOLUTION | ORAL | Status: DC | PRN

## 2017-10-07 MED ORDER — ENOXAPARIN SODIUM 40 MG/0.4ML ~~LOC~~ SOLN
40.0000 mg | SUBCUTANEOUS | Status: DC
  Administered 2017-10-08: 40 mg via SUBCUTANEOUS
  Filled 2017-10-07 (×2): qty 0.4

## 2017-10-07 MED ORDER — LORAZEPAM 1 MG PO TABS: 1.0000 mg | ORAL_TABLET | Freq: Four times a day (QID) | ORAL | 0 refills | Status: DC | PRN

## 2017-10-07 MED ORDER — LORAZEPAM 2 MG/ML IJ SOLN
1.0000 mg | Freq: Once | INTRAMUSCULAR | Status: AC
  Administered 2017-10-07: 1 mg via INTRAVENOUS

## 2017-10-07 MED ORDER — SODIUM CHLORIDE 0.9% FLUSH: 10.0000 mL | INTRAVENOUS | Status: DC | PRN

## 2017-10-07 MED ORDER — SENNOSIDES-DOCUSATE SODIUM 8.6-50 MG PO TABS
1.0000 | ORAL_TABLET | Freq: Every evening | ORAL | Status: DC | PRN
  Filled 2017-10-07: qty 1

## 2017-10-07 MED ORDER — ONDANSETRON HCL 4 MG PO TABS: 4.0000 mg | ORAL_TABLET | Freq: Three times a day (TID) | ORAL | Status: DC | PRN

## 2017-10-07 MED ORDER — ONDANSETRON 4 MG PO TBDP: 4.0000 mg | ORAL_TABLET | Freq: Three times a day (TID) | ORAL | Status: DC | PRN

## 2017-10-07 MED ORDER — SODIUM CHLORIDE 0.9 % IV SOLN
Freq: Once | INTRAVENOUS | Status: AC
  Administered 2017-10-07: 10:00:00 via INTRAVENOUS

## 2017-10-07 MED ORDER — ACETAMINOPHEN 325 MG PO TABS
650.0000 mg | ORAL_TABLET | ORAL | Status: DC | PRN
  Administered 2017-10-08 – 2017-10-09 (×3): 650 mg via ORAL
  Filled 2017-10-07 (×2): qty 2

## 2017-10-07 MED ORDER — OXYCODONE-ACETAMINOPHEN 5-325 MG PO TABS: 1.0000 | ORAL_TABLET | ORAL | 0 refills | Status: DC | PRN

## 2017-10-07 NOTE — Progress Notes (Signed)
Pt resting peacefully, fluids infusing. O2 readings and heart rate WNL and charted.

## 2017-10-07 NOTE — Progress Notes (Signed)
Patient Care Team: [name], MD as PCP - General (Family Medicine) [name], MD as Consulting Physician (General Surgery) Nicholas Lose, MD as Consulting Physician (Hematology and Oncology) Kyung Rudd, MD as Consulting Physician (Radiation Oncology)  DIAGNOSIS: No diagnosis found.  SUMMARY OF ONCOLOGIC HISTORY:   Malignant neoplasm of upper-inner quadrant of right breast in female, estrogen receptor positive ([place])   06/21/2017 Initial Diagnosis    Screening detected right breast mass and calcifications 1 cm at 1:00 position biopsy of mass and calcifications revealed invasive lobular cancer with pleomorphic features, grade 2, axillary lymph node biopsy positive for cancer, ER/PR positive HER-2 negative with a Ki-67 of 40%, T1b N1 stage IB AJCC 8      08/06/2017 Surgery    Right mastectomy: Scattered foci of invasive lobular cancer, grade 2, 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, LCIS, margins negative, 2/6 lymph nodes +1 with isolated tumor cells, T1BN1 stage I the AJCC 8 ER 95%, PR 30%, HER-2 negative, Ki-67 40%      08/12/2017 Miscellaneous    Mammaprint high risk luminal type B      09/17/2017 -  Chemotherapy    Adjuvant chemo with dose dense Adriamycin and Cytoxan followed by Taxol weekly x12       CHIEF COMPLIANT: Severe anxiety, has not slept for the last couple of days, low back pain  INTERVAL HISTORY: Gloria Ware is a 71 year old with above-mentioned history of right breast cancer currently on adjuvant chemotherapy with dose dense Adriamycin and Cytoxan.  After cycle 2 of chemotherapy she called [name] over the weekend multiple times complaining that she is not able to sleep and she is completely wired up and anxious.  She was also having low back pain which has made it very difficult for her to ambulate as well.  She was in the emergency room and they discharged her with pain medication.  REVIEW OF SYSTEMS:   Constitutional: Completely wired up, inability to sleep Eyes:  Denies blurriness of vision Ears, nose, mouth, throat, and face: Denies mucositis or sore throat Respiratory: Denies cough, dyspnea or wheezes Cardiovascular: Denies palpitation, chest discomfort Gastrointestinal:  Denies nausea, heartburn or change in bowel habits Skin: Denies abnormal skin rashes Lymphatics: Denies new lymphadenopathy or easy bruising Neurological: Generalized weakness Behavioral/Psych: Severe anxiety Extremities: No lower extremity edema All other systems were reviewed with the patient and are negative.  I have reviewed the past medical history, past surgical history, social history and family history with the patient and they are unchanged from previous note.  ALLERGIES:  is allergic to penicillins; sulfa antibiotics; atorvastatin; and zetia [ezetimibe].  MEDICATIONS:  Current Outpatient Medications  Medication Sig Dispense Refill  . acetaminophen (TYLENOL) 500 MG tablet Take 500-1,000 mg by mouth every 6 (six) hours as needed for moderate pain or headache.     . anastrozole (ARIMIDEX) 1 MG tablet Take 1 tablet (1 mg total) by mouth daily. (Patient not taking: Reported on 10/05/2017) 90 tablet 3  . aspirin 81 MG tablet Take 81 mg by mouth daily.      . Calcium Carbonate-Vitamin D (CALTRATE 600+D PO) Take 1 tablet by mouth 2 (two) times daily.    Marland Kitchen dexamethasone (DECADRON) 4 MG tablet Take 4 mg by mouth daily. TAKE 1 TABLET DAILY FOR 2 DAYS AFTER EACH CHEMO TREATMENT    . lidocaine-prilocaine (EMLA) cream Apply to affected area once 30 g 3  . loratadine (CLARITIN) 10 MG tablet Take 10 mg by mouth as needed for allergies.    Marland Kitchen  LORazepam (ATIVAN) 1 MG tablet Take 1 tablet (1 mg total) by mouth every 6 (six) hours as needed for anxiety or sleep. 60 tablet 0  . methocarbamol (ROBAXIN) 500 MG tablet Take 1 tablet (500 mg total) by mouth every 6 (six) hours as needed for muscle spasms. (Patient not taking: Reported on 10/02/2017) 20 tablet 1  . ondansetron (ZOFRAN) 8 MG  tablet Take 1 tablet (8 mg total) by mouth 2 (two) times daily as needed. Start on the third day after chemotherapy. (Patient not taking: Reported on 10/02/2017) 30 tablet 1  . oxyCODONE-acetaminophen (PERCOCET) 5-325 MG tablet Take 1 tablet by mouth every 4 (four) hours as needed. 60 tablet 0  . pegfilgrastim (NEULASTA ONPRO) 6 MG/0.6ML injection Inject 6 mg into the skin once. Inject via provided programmed delivery device. AFTER CHEMO EVERY OTHER MONDAY    . prochlorperazine (COMPAZINE) 10 MG tablet Take 1 tablet (10 mg total) by mouth every 6 (six) hours as needed (Nausea or vomiting). 90 tablet 0  . sertraline (ZOLOFT) 100 MG tablet Take 200 mg by mouth daily.      No current facility-administered medications for this visit.     PHYSICAL EXAMINATION: ECOG PERFORMANCE STATUS: 2 - Symptomatic, <50% confined to bed  Vitals:   10/07/17 1035 10/07/17 1153  BP: (!) 106/48   Pulse: 98 92  Resp: 14   Temp:    SpO2: 98% 100%   Filed Weights   10/07/17 0845  Weight: 139 lb (63 kg)    GENERAL:alert, no distress and comfortable SKIN: skin color, texture, turgor are normal, no rashes or significant lesions EYES: normal, Conjunctiva are pink and non-injected, sclera clear OROPHARYNX:no exudate, no erythema and lips, buccal mucosa, and tongue normal  NECK: supple, thyroid normal size, non-tender, without nodularity LYMPH:  no palpable lymphadenopathy in the cervical, axillary or inguinal LUNGS: clear to auscultation and percussion with normal breathing effort HEART: regular rate & rhythm and no murmurs and no lower extremity edema ABDOMEN:abdomen soft, non-tender and normal bowel sounds MUSCULOSKELETAL:no cyanosis of digits and no clubbing  NEURO: alert & oriented x 3 with fluent speech, no focal motor/sensory deficits, low back pain EXTREMITIES: No lower extremity edema  LABORATORY DATA:  I have reviewed the data as listed   Chemistry      Component Value Date/Time   NA 131 (L)  10/05/2017 0955   NA 138 09/30/2017 1242   K 3.9 10/05/2017 0955   K 4.0 09/30/2017 1242   CL 101 10/05/2017 0955   CO2 21 (L) 10/05/2017 0955   CO2 23 09/30/2017 1242   BUN 18 10/05/2017 0955   BUN 14.4 09/30/2017 1242   CREATININE 0.73 10/05/2017 0955   CREATININE 1.0 09/30/2017 1242      Component Value Date/Time   CALCIUM 9.1 10/05/2017 0955   CALCIUM 8.9 09/30/2017 1242   ALKPHOS 166 (H) 10/05/2017 0955   ALKPHOS 136 09/30/2017 1242   AST 22 10/05/2017 0955   AST 12 09/30/2017 1242   ALT 18 10/05/2017 0955   ALT 13 09/30/2017 1242   BILITOT 0.8 10/05/2017 0955   BILITOT <0.22 09/30/2017 1242       Lab Results  Component Value Date   WBC 9.9 10/05/2017   HGB 11.5 (L) 10/05/2017   HCT 33.5 (L) 10/05/2017   MCV 86.3 10/05/2017   PLT 185 10/05/2017   NEUTROABS 9.1 (H) 10/05/2017    ASSESSMENT & PLAN:  No problem-specific Assessment & Plan notes found for this encounter.  I spent 25 minutes talking to the patient of which more than half was spent in counseling and coordination of care.  No orders of the defined types were placed in this encounter.  The patient has a good understanding of the overall plan. she agrees with it. she will call with any problems that may develop before the next visit here.   Harriette Ohara, MD 10/07/17

## 2017-10-07 NOTE — Progress Notes (Signed)
Fluids completed. Port is saline locked. Pt woke and needed to use restroom. She had already had an accident. Took pt to bathroom x2. Varney Biles RN found a gown for [name] to put on pt. Husband has soiled clothing. Patient alert and oriented but drowsy. Vitals WNL. Pt is being admitted to [facility]. They asked to wait 2min prior to calling report. Patient and husband are in family waiting room. She is comfortable in wheelchair with blankets.  Cyndia Bent RN

## 2017-10-07 NOTE — H&P (Signed)
[facility] NOTE  Patient Care Team: [name], MD as PCP - General (Family Medicine) [name], MD as Consulting Physician (General Surgery) Nicholas Lose, MD as Consulting Physician (Hematology and Oncology) Kyung Rudd, MD as Consulting Physician (Radiation Oncology)  CHIEF COMPLAINTS:  Failure to thrive, severe fatigue, insomnia, dehydration after cycle 2 of chemotherapy with dose dense Adriamycin and Cytoxan  HISTORY OF PRESENTING ILLNESS:  Gloria Ware 71 y.o. female is admitted to the hospital from the clinic because she developed dehydration, severe fatigue and generalized weakness as well as insomnia as a result of chemotherapy.  She arrived to the clinic fairly weak condition and told me that she has not had sleep over the past 48 hours in spite of taking oral Ativan.  She was also taking oxycodone pain medication for her low back pain which has been bothering her for the past couple of days.  The pain has not improved in spite of multiple oxycodone tablets.  She was completely wired up and we administered in the clinic IV fluids along with 1 mg of Ativan and 25 mg of Phenergan.  She immediately slept and was partially not arousable for couple of hours.  When she woke up she soiled her clothes but was very drowsy and weak.  In order to continue to treat her dehydration and low back pain accompanied with insomnia, she is being admitted to the hospital.  I reviewed her records extensively and collaborated the history with the patient.  SUMMARY OF ONCOLOGIC HISTORY:   Malignant neoplasm of upper-inner quadrant of right breast in female, estrogen receptor positive ([place])   06/21/2017 Initial Diagnosis    Screening detected right breast mass and calcifications 1 cm at 1:00 position biopsy of mass and calcifications revealed invasive lobular cancer with pleomorphic features, grade 2, axillary lymph node biopsy positive for cancer, ER/PR positive HER-2 negative  with a Ki-67 of 40%, T1b N1 stage IB AJCC 8      08/06/2017 Surgery    Right mastectomy: Scattered foci of invasive lobular cancer, grade 2, 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, LCIS, margins negative, 2/6 lymph nodes +1 with isolated tumor cells, T1BN1 stage I the AJCC 8 ER 95%, PR 30%, HER-2 negative, Ki-67 40%      08/12/2017 Miscellaneous    Mammaprint high risk luminal type B      09/17/2017 -  Chemotherapy    Adjuvant chemo with dose dense Adriamycin and Cytoxan followed by Taxol weekly x12      MEDICAL HISTORY:  Past Medical History:  Diagnosis Date  . Anxiety   . Breast CA ([place])   . Colon polyp 09/12/2005  . Depression   . Eczema    right forearm   . Family history of breast cancer   . Genetic testing 06/27/2017   Multi-Cancer panel (83 genes) @ [facility] - No pathogenic mutations detected  . Hearing loss   . High cholesterol   . Injury     left foot metatarsal fracture sustined 08-03-17 , treated with boot; patient  reports  improvement as of today  . Osteopenia 11/2016   T score -2.1 stable from prior DEXA  . PONV (postoperative nausea and vomiting)    post op nausea  . Tinnitus     SURGICAL HISTORY: Past Surgical History:  Procedure Laterality Date  . CARDIAC CATHETERIZATION     husband was having symtpoms and needed a stent; subsequently she states she started " to feel something"  but thinks it  was " all in my head bc of my hbsand". subsequently saw same cardiologist as her husband who sent for treadmill stress that had inconcluisicve results so she was sent for cardiac catherization which she states was " definitely negative "   . cataract surg    . DILATION AND CURETTAGE OF UTERUS    . IR CV LINE INJECTION  09/30/2017  . MASTECTOMY W/ SENTINEL NODE BIOPSY Right 08/06/2017   Procedure: RIGHT MASTECTOMY WITH SENTINEL LYMPH NODE BIOPSY;  Surgeon: [name], MD;  Location: [facility];  Service: General;  Laterality: Right;  . MOUTH SURGERY    .  PORTACATH PLACEMENT N/A 09/12/2017   Procedure: INSERTION PORT-A-CATH;  Surgeon: [name], MD;  Location: [facility];  Service: General;  Laterality: N/A;  . RADIOACTIVE SEED GUIDED AXILLARY SENTINEL LYMPH NODE Right 08/06/2017   Procedure: SEED TARGETED AXILLARY LYMPH NODE EXCISION;  Surgeon: [name], MD;  Location: [facility];  Service: General;  Laterality: Right;    SOCIAL HISTORY: Social History   Socioeconomic History  . Marital status: Married    Spouse name: Not on file  . Number of children: Not on file  . Years of education: Not on file  . Highest education level: Not on file  Social Needs  . Financial resource strain: Not on file  . Food insecurity - worry: Not on file  . Food insecurity - inability: Not on file  . Transportation needs - medical: Not on file  . Transportation needs - non-medical: Not on file  Occupational History  . Not on file  Tobacco Use  . Smoking status: Never Smoker  . Smokeless tobacco: Never Used  Substance and Sexual Activity  . Alcohol use: No    Alcohol/week: 0.0 oz    Frequency: Never    Comment: WINE - rarely  . Drug use: No  . Sexual activity: Yes    Partners: Male    Birth control/protection: Post-menopausal    Comment: 1st intercourse 28 yo-1 partner  Other Topics Concern  . Not on file  Social History Narrative  . Not on file    FAMILY HISTORY: Family History  Problem Relation Age of Onset  . Breast cancer Sister 42       currently 50  . Hypertension Maternal Grandmother   . Stroke Maternal Grandmother   . Hypertension Maternal Grandfather   . Stroke Maternal Grandfather   . Breast cancer Cousin        unconfirmed in maternal first cousin  . Ovarian cancer Cousin        unconfirmed if ovarian vs. uterine in paternal first cousin    ALLERGIES:  is allergic to penicillins; sulfa antibiotics; atorvastatin; and zetia [ezetimibe].  MEDICATIONS:  Current Facility-Administered Medications   Medication Dose Route Frequency Provider Last Rate Last Dose  . 0.9 %  sodium chloride infusion   Intravenous Continuous Causey, Charlestine Massed, NP      . acetaminophen (TYLENOL) tablet 650 mg  650 mg Oral Q4H PRN Gardenia Phlegm, NP      . alum & mag hydroxide-simeth (MAALOX/MYLANTA) 200-200-20 MG/5ML suspension 60 mL  60 mL Oral Q4H PRN Causey, Charlestine Massed, NP      . enoxaparin (LOVENOX) injection 40 mg  40 mg Subcutaneous Q24H Causey, Charlestine Massed, NP      . guaiFENesin-dextromethorphan (ROBITUSSIN DM) 100-10 MG/5ML syrup 10 mL  10 mL Oral Q4H PRN Causey, Charlestine Massed, NP      . hydrocortisone (  ANUSOL-HC) 2.5 % rectal cream 1 application  1 application Rectal BID PRN Gardenia Phlegm, NP      . ondansetron (ZOFRAN) tablet 4-8 mg  4-8 mg Oral Q8H PRN Causey, Charlestine Massed, NP       Or  . ondansetron (ZOFRAN-ODT) disintegrating tablet 4-8 mg  4-8 mg Oral Q8H PRN Causey, Charlestine Massed, NP       Or  . ondansetron (ZOFRAN) injection 4 mg  4 mg Intravenous Q8H PRN Causey, Charlestine Massed, NP       Or  . ondansetron (ZOFRAN) 8 mg in sodium chloride 0.9 % 50 mL IVPB  8 mg Intravenous Q8H PRN Causey, Charlestine Massed, NP      . oxyCODONE-acetaminophen (PERCOCET/ROXICET) 5-325 MG per tablet 1 tablet  1 tablet Oral Q4H PRN Causey, Charlestine Massed, NP      . prochlorperazine (COMPAZINE) tablet 10 mg  10 mg Oral Q6H PRN Causey, Charlestine Massed, NP      . senna-docusate (Senokot-S) tablet 1 tablet  1 tablet Oral QHS PRN Gardenia Phlegm, NP      . sertraline (ZOLOFT) tablet 200 mg  200 mg Oral Daily Causey, Charlestine Massed, NP        REVIEW OF SYSTEMS:   Constitutional: Generalized fatigue and weakness Eyes: Denies blurriness of vision, double vision or watery eyes Ears, nose, mouth, throat, and face: Denies mucositis or sore throat Respiratory: Denies cough, dyspnea or wheezes Cardiovascular: Denies palpitation, chest discomfort or lower extremity  swelling Gastrointestinal:  Denies nausea, heartburn or change in bowel habits Skin: Denies abnormal skin rashes Lymphatics: Denies new lymphadenopathy or easy bruising Neurological:Denies numbness, tingling or new weaknesses Behavioral/Psych: Prior history of anxiety and depression, combined with insomnia All other systems were reviewed with the patient and are negative.  PHYSICAL EXAMINATION: ECOG PERFORMANCE STATUS: 2 - Symptomatic, <50% confined to bed  Vitals:   10/07/17 1403  BP: 127/68  Pulse: 92  Resp: 18  Temp: 98.1 F (36.7 C)  SpO2: 100%   Filed Weights   10/07/17 1403  Weight: 148 lb 13 oz (67.5 kg)    GENERAL:alert, no distress and comfortable SKIN: skin color, texture, turgor are normal, no rashes or significant lesions EYES: normal, conjunctiva are pink and non-injected, sclera clear OROPHARYNX:no exudate, no erythema and lips, buccal mucosa, and tongue normal  NECK: supple, thyroid normal size, non-tender, without nodularity LYMPH:  no palpable lymphadenopathy in the cervical, axillary or inguinal LUNGS: clear to auscultation and percussion with normal breathing effort HEART: regular rate & rhythm and no murmurs and no lower extremity edema ABDOMEN:abdomen soft, non-tender and normal bowel sounds Musculoskeletal:no cyanosis of digits and no clubbing  PSYCH: alert & oriented x 3 with fluent speech NEURO: Essential tremor   LABORATORY DATA:  I have reviewed the data as listed Lab Results  Component Value Date   WBC 9.9 10/05/2017   HGB 11.5 (L) 10/05/2017   HCT 33.5 (L) 10/05/2017   MCV 86.3 10/05/2017   PLT 185 10/05/2017   Lab Results  Component Value Date   NA 131 (L) 10/05/2017   K 3.9 10/05/2017   CL 101 10/05/2017   CO2 21 (L) 10/05/2017    RADIOGRAPHIC STUDIES: I have personally reviewed the radiological reports and agreed with the findings in the report.  ASSESSMENT AND PLAN:  1.  Dehydration due to inability to drink fluids because  of chemotherapy-induced nausea as well as multiple other symptoms including back pain with inability to get comfortable in any  particular position and her inability to sleep. 2. after the intervention of the clinic, patient appears to be quite somnolent  but does answer questions.  She will be admitted to the hospital for observation  3.  Severe fatigue/failure to thrive: Patient is too weak to be discharged home. 4.  Breast cancer currently on adjuvant chemotherapy.  She completed 2 cycles of dose dense Adriamycin and Cytoxan on 09/30/2017 5.  DVT prophylaxis with Lovenox We will assess how the patient is doing tomorrow before making any decisions regarding further hospitalization. Expected hospital stay 1-2 nights  All questions were answered. The patient knows to call the clinic with any problems, questions or concerns.    Harriette Ohara, MD @T @

## 2017-10-07 NOTE — Patient Instructions (Signed)
Dehydration, Adult Dehydration is when there is not enough fluid or water in your body. This happens when you lose more fluids than you take in. Dehydration can range from mild to very bad. It should be treated right away to keep it from getting very bad. Symptoms of mild dehydration may include:  Thirst.  Dry lips.  Slightly dry mouth.  Dry, warm skin.  Dizziness. Symptoms of moderate dehydration may include:  Very dry mouth.  Muscle cramps.  Dark pee (urine). Pee may be the color of tea.  Your body making less pee.  Your eyes making fewer tears.  Heartbeat that is uneven or faster than normal (palpitations).  Headache.  Light-headedness, especially when you stand up from sitting.  Fainting (syncope). Symptoms of very bad dehydration may include:  Changes in skin, such as: ? Cold and clammy skin. ? Blotchy (mottled) or pale skin. ? Skin that does not quickly return to normal after being lightly pinched and let go (poor skin turgor).  Changes in body fluids, such as: ? Feeling very thirsty. ? Your eyes making fewer tears. ? Not sweating when body temperature is high, such as in hot weather. ? Your body making very little pee.  Changes in vital signs, such as: ? Weak pulse. ? Pulse that is more than 100 beats a minute when you are sitting still. ? Fast breathing. ? Low blood pressure.  Other changes, such as: ? Sunken eyes. ? Cold hands and feet. ? Confusion. ? Lack of energy (lethargy). ? Trouble waking up from sleep. ? Short-term weight loss. ? Unconsciousness. Follow these instructions at home:  If told by your doctor, drink an ORS: ? Make an ORS by using instructions on the package. ? Start by drinking small amounts, about  cup (120 mL) every 5-10 minutes. ? Slowly drink more until you have had the amount that your doctor said to have.  Drink enough clear fluid to keep your pee clear or pale yellow. If you were told to drink an ORS, finish the ORS  first, then start slowly drinking clear fluids. Drink fluids such as: ? Water. Do not drink only water by itself. Doing that can make the salt (sodium) level in your body get too low (hyponatremia). ? Ice chips. ? Fruit juice that you have added water to (diluted). ? Low-calorie sports drinks.  Avoid: ? Alcohol. ? Drinks that have a lot of sugar. These include high-calorie sports drinks, fruit juice that does not have water added, and soda. ? Caffeine. ? Foods that are greasy or have a lot of fat or sugar.  Take over-the-counter and prescription medicines only as told by your doctor.  Do not take salt tablets. Doing that can make the salt level in your body get too high (hypernatremia).  Eat foods that have minerals (electrolytes). Examples include bananas, oranges, potatoes, tomatoes, and spinach.  Keep all follow-up visits as told by your doctor. This is important. Contact a doctor if:  You have belly (abdominal) pain that: ? Gets worse. ? Stays in one area (localizes).  You have a rash.  You have a stiff neck.  You get angry or annoyed more easily than normal (irritability).  You are more sleepy than normal.  You have a harder time waking up than normal.  You feel: ? Weak. ? Dizzy. ? Very thirsty.  You have peed (urinated) only a small amount of very dark pee during 6-8 hours. Get help right away if:  You have symptoms of   very bad dehydration.  You cannot drink fluids without throwing up (vomiting).  Your symptoms get worse with treatment.  You have a fever.  You have a very bad headache.  You are throwing up or having watery poop (diarrhea) and it: ? Gets worse. ? Does not go away.  You have blood or something green (bile) in your throw-up.  You have blood in your poop (stool). This may cause poop to look black and tarry.  You have not peed in 6-8 hours.  You pass out (faint).  Your heart rate when you are sitting still is more than 100 beats a  minute.  You have trouble breathing. This information is not intended to replace advice given to you by your health care provider. Make sure you discuss any questions you have with your health care provider. Document Released: 07/21/2009 Document Revised: 04/13/2016 Document Reviewed: 11/18/2015 Elsevier Interactive Patient Education  2018 [employer]  

## 2017-10-07 NOTE — Assessment & Plan Note (Signed)
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  Mammaprint high risk: Dose since Adriamycin and Cytoxan x4 followed by Taxol weekly x12 started 09/23/2017 2. adjuvant radiation therapy 3.  Followed by adjuvant antiestrogen therapy -------------------------------------------------------------------------------------------------------------------------------------- Current treatment: Cycle 2  dose dense Adriamycin and Cytoxan Echocardiogram 09/09/2017: EF 65-70% Chemo toxicities: 1.  Severe insomnia: 2. Dehydration 3.  Low back pain  4.  Severe fatigue  5.  nausea: I gave her 1 mg of Ativan IV along with 25 mg of Phenergan and 1 Percocet Patient slept soundly for 3 hours she was hard to wake up and also soiled her clothes. Given the profound fatigue and failure to thrive, I recommended admission to the hospital for overnight observation.

## 2017-10-07 NOTE — Progress Notes (Signed)
Per Dr. Lindi Adie pt needed fluids, ativan 1mg  IV, percocet 5-325mg  PO, and phenergan 25mg  IV. Called infusion Charge RN, Julio Sicks and she expressed there was no room for an add on patient in infusion as of now. Symptom management was not here today. Accessed pt port a cath in office room. Started fluids 577ml/hr for 2 hours. Gave pt prescribed medications by Dr. Lindi Adie shown on [facility]. Patient became very drowsy. Patient was taken to breast center family room via wheelchair and places on the couch where RN myself and husband laid pt down. Vitals were all within normal limits and charted. Patient is resting peacefully. O2 stats are being monitored and are WNL. Husband is with patient. RN will check on patient as well and husband will let RN know if anything is needed.  Cyndia Bent RN

## 2017-10-08 DIAGNOSIS — F418 Other specified anxiety disorders: Secondary | ICD-10-CM

## 2017-10-08 LAB — COMPREHENSIVE METABOLIC PANEL
ALBUMIN: 3.9 g/dL (ref 3.5–5.0)
ALT: 16 U/L (ref 14–54)
ANION GAP: 8 (ref 5–15)
AST: 19 U/L (ref 15–41)
Alkaline Phosphatase: 102 U/L (ref 38–126)
BILIRUBIN TOTAL: 0.3 mg/dL (ref 0.3–1.2)
BUN: 9 mg/dL (ref 6–20)
CHLORIDE: 106 mmol/L (ref 101–111)
CO2: 22 mmol/L (ref 22–32)
Calcium: 8.8 mg/dL — ABNORMAL LOW (ref 8.9–10.3)
Creatinine, Ser: 0.79 mg/dL (ref 0.44–1.00)
GFR calc Af Amer: >60 mL/min (ref >60)
Glucose, Bld: 102 mg/dL — ABNORMAL HIGH (ref 65–99)
POTASSIUM: 3.4 mmol/L — AB (ref 3.5–5.1)
Sodium: 136 mmol/L (ref 135–145)
TOTAL PROTEIN: 6.9 g/dL (ref 6.5–8.1)

## 2017-10-08 LAB — CBC
HEMATOCRIT: 30.4 % — AB (ref 36.0–46.0)
HEMOGLOBIN: 10.5 g/dL — AB (ref 12.0–15.0)
MCH: 29.7 pg (ref 26.0–34.0)
MCHC: 34.5 g/dL (ref 30.0–36.0)
MCV: 86.1 fL (ref 78.0–100.0)
Platelets: 178 10*3/uL (ref 150–400)
RBC: 3.53 MIL/uL — ABNORMAL LOW (ref 3.87–5.11)
RDW: 13.3 % (ref 11.5–15.5)
WBC: 0.8 10*3/uL — AB (ref 4.0–10.5)

## 2017-10-08 MED ORDER — CYCLOBENZAPRINE HCL 5 MG PO TABS
5.0000 mg | ORAL_TABLET | Freq: Three times a day (TID) | ORAL | Status: DC | PRN
  Administered 2017-10-08 – 2017-10-09 (×4): 5 mg via ORAL
  Filled 2017-10-08 (×5): qty 1

## 2017-10-08 MED ORDER — CALCIUM CARBONATE-VITAMIN D 500-200 MG-UNIT PO TABS
1.0000 | ORAL_TABLET | Freq: Two times a day (BID) | ORAL | Status: DC
  Administered 2017-10-08 – 2017-10-09 (×3): 1 via ORAL
  Filled 2017-10-08 (×3): qty 1

## 2017-10-08 MED ORDER — ALTEPLASE 2 MG IJ SOLR
2.0000 mg | Freq: Once | INTRAMUSCULAR | Status: DC
  Filled 2017-10-08: qty 2

## 2017-10-08 MED ORDER — LORAZEPAM 2 MG/ML IJ SOLN
0.5000 mg | Freq: Three times a day (TID) | INTRAMUSCULAR | Status: DC | PRN
  Administered 2017-10-08 (×2): 0.5 mg via INTRAVENOUS
  Filled 2017-10-08 (×2): qty 1

## 2017-10-08 MED ORDER — ASPIRIN EC 81 MG PO TBEC
81.0000 mg | DELAYED_RELEASE_TABLET | Freq: Every day | ORAL | Status: DC
  Administered 2017-10-08 – 2017-10-09 (×2): 81 mg via ORAL
  Filled 2017-10-08 (×2): qty 1

## 2017-10-08 MED ORDER — CALCIUM CARBONATE-VITAMIN D 600-400 MG-UNIT PO TABS: ORAL_TABLET | Freq: Two times a day (BID) | ORAL | Status: DC

## 2017-10-08 NOTE — Progress Notes (Signed)
Pt has been frequently using her call light this shift, for multiple requests.  Has been difficult to get pt into a comfortable position with the pillows and positioning of head of bed and multiple adjusting of her hips.  Has been up in the chair x 2.  Has been medicated for pain.  Wanting to get up to bsc every hour.  Pt finally admitted that she is restless and overwhelmed by the cancer diagnosis, port placement, and chemo.  Allowed pt to talk and voice feelings.

## 2017-10-08 NOTE — Progress Notes (Signed)
Pt is very uncomfortable. She has not had any sleep. Pt is very anxious. Paged answering service for doc at cancer center. Will continue to monitor. Awaiting call from md.

## 2017-10-08 NOTE — Progress Notes (Signed)
CRITICAL VALUE ALERT  Critical Value:  WBC 0.80  Date & Time Notied:  10/08/2017, 1143  Provider Notified:[name], Vinay  Orders Received/Actions taken: no new orders received

## 2017-10-08 NOTE — Progress Notes (Signed)
HEMATOLOGY-ONCOLOGY PROGRESS NOTE  SUBJECTIVE: Rough night last night with being uncomfortable from back pain and difficulty with sleeping.  OBJECTIVE: REVIEW OF SYSTEMS:   Constitutional: Denies fevers, chills or abnormal weight loss Eyes: Denies blurriness of vision Ears, nose, mouth, throat, and face: Denies mucositis or sore throat Respiratory: mild SOB Cardiovascular: Denies palpitation, chest discomfort Gastrointestinal:  Denies nausea, heartburn or change in bowel habits Skin: Denies abnormal skin rashes Lymphatics: Denies new lymphadenopathy or easy bruising Neurological:Low back pain Behavioral/Psych: Mood is stable, no new changes  Extremities: No lower extremity edema All other systems were reviewed with the patient and are negative.  I have reviewed the past medical history, past surgical history, social history and family history with the patient and they are unchanged from previous note.   PHYSICAL EXAMINATION: ECOG PERFORMANCE STATUS: 3 - Symptomatic, >50% confined to bed  Vitals:   10/07/17 2120 10/08/17 0454  BP: (!) 111/56 (!) 116/58  Pulse: (!) 103 94  Resp: 18 18  Temp: 98.3 F (36.8 C) 98.5 F (36.9 C)  SpO2: 98% 96%   Filed Weights   10/07/17 1403 10/08/17 0129  Weight: 148 lb 13 oz (67.5 kg) 146 lb (66.2 kg)    GENERAL:alert, no distress and comfortable SKIN: skin color, texture, turgor are normal, no rashes or significant lesions EYES: normal, Conjunctiva are pink and non-injected, sclera clear OROPHARYNX:no exudate, no erythema and lips, buccal mucosa, and tongue normal  NECK: supple, thyroid normal size, non-tender, without nodularity LYMPH:  no palpable lymphadenopathy in the cervical, axillary or inguinal LUNGS: clear to auscultation and percussion with normal breathing effort HEART: regular rate & rhythm and no murmurs and no lower extremity edema ABDOMEN:abdomen soft, non-tender and normal bowel sounds Musculoskeletal:no cyanosis of  digits and no clubbing  NEURO: alert & oriented x 3 with fluent speech, no focal motor/sensory deficits  LABORATORY DATA:  I have reviewed the data as listed CMP Latest Ref Rng & Units 10/05/2017 09/30/2017 09/23/2017  Glucose 65 - 99 mg/dL 114(H) 120 106  BUN 6 - 20 mg/dL 18 14.4 19.4  Creatinine 0.44 - 1.00 mg/dL 0.73 1.0 0.8  Sodium 135 - 145 mmol/L 131(L) 138 135(L)  Potassium 3.5 - 5.1 mmol/L 3.9 4.0 4.0  Chloride 101 - 111 mmol/L 101 - -  CO2 22 - 32 mmol/L 21(L) 23 22  Calcium 8.9 - 10.3 mg/dL 9.1 8.9 9.1  Total Protein 6.5 - 8.1 g/dL 7.0 6.9 7.2  Total Bilirubin 0.3 - 1.2 mg/dL 0.8 <0.22 0.48  Alkaline Phos 38 - 126 U/L 166(H) 136 137  AST 15 - 41 U/L 22 12 16   ALT 14 - 54 U/L 18 13 31     Lab Results  Component Value Date   WBC 9.9 10/05/2017   HGB 11.5 (L) 10/05/2017   HCT 33.5 (L) 10/05/2017   MCV 86.3 10/05/2017   PLT 185 10/05/2017   NEUTROABS 9.1 (H) 10/05/2017    ASSESSMENT AND PLAN: 1. Anxiety/ Depression: Ativan and Zoloft 2. Severe back pain: Added Flexeril to Percocet. She will need ortho as outpatient 3. Dehydration: Improving 4. Blood work needed today 5. Breast cancer: S/P 2 cycles of chemo. Will need to make decisions reg further chemo as an out patient 5. Discharge planning: Hope to send her home tomorrow.

## 2017-10-09 DIAGNOSIS — D701 Agranulocytosis secondary to cancer chemotherapy: Secondary | ICD-10-CM

## 2017-10-09 MED ORDER — HEPARIN SOD (PORK) LOCK FLUSH 100 UNIT/ML IV SOLN
500.0000 [IU] | INTRAVENOUS | Status: DC | PRN
  Filled 2017-10-09: qty 5

## 2017-10-09 MED ORDER — CYCLOBENZAPRINE HCL 5 MG PO TABS: 5.0000 mg | ORAL_TABLET | Freq: Three times a day (TID) | ORAL | 0 refills | Status: DC | PRN

## 2017-10-09 NOTE — Discharge Instructions (Signed)
Percocets every 8 hours as needed for back pain Flexeril twice daily as needed for back pain Keep the follow up next Monday with me (Dr.Daleyza Gadomski at [facility])

## 2017-10-09 NOTE — Discharge Summary (Signed)
Physician Discharge Summary  Patient ID: Gloria Ware MRN: [id number] DOB/AGE: [birth year] 74 y.o.  Admit date: 10/07/2017 Discharge date: 10/09/2017  Primary Care Physician:  [name], MD  Discharge Diagnoses:   1. Breast cancer 2. Back arthritis 3. Dehydration 4. Generalized weakness and Failure to thrive 5. Neutropenia due to chemo 6. Depression 7. Insomnia 8. Anxiety  Present on Admission: . Dehydration . Insomnia  Discharge Medications:  Allergies as of 10/09/2017      Reactions   Penicillins Other (See Comments)   Throat burning Has patient had a PCN reaction causing immediate rash, facial/tongue/throat swelling, SOB or lightheadedness with hypotension: No Has patient had a PCN reaction causing severe rash involving mucus membranes or skin necrosis: No Has patient had a PCN reaction that required hospitalization: No Has patient had a PCN reaction occurring within the last 10 years: No If all of the above answers are "NO", then may proceed with Cephalosporin use.   Sulfa Antibiotics Nausea And Vomiting   Atorvastatin     Muscle aches    Zetia [ezetimibe] Other (See Comments)   Muscle aches       Medication List    STOP taking these medications   acetaminophen 500 MG tablet Commonly known as:  TYLENOL   loratadine 10 MG tablet Commonly known as:  CLARITIN   NEULASTA ONPRO 6 MG/0.6ML injection Generic drug:  pegfilgrastim     TAKE these medications   aspirin 81 MG tablet Take 81 mg by mouth daily.   CALTRATE 600+D PO Take 1 tablet by mouth 2 (two) times daily.   cyclobenzaprine 5 MG tablet Commonly known as:  FLEXERIL Take 1 tablet (5 mg total) by mouth 3 (three) times daily as needed for muscle spasms.   lidocaine-prilocaine cream Commonly known as:  EMLA Apply to affected area once   LORazepam 0.5 MG tablet Commonly known as:  ATIVAN TAKE 1 TABLET AT BEDTIME AS NEEDED FOR NAUSEA OR VOMITING What changed:  Another medication  with the same name was removed. Continue taking this medication, and follow the directions you see here.   ondansetron 8 MG tablet Commonly known as:  ZOFRAN TAKE 1 TABLET TWICE A DAY AS NEEDED FOR NAUSEA AND VOMITING. START ON THE 3RD DAY AFTER CHEMOTHERAPY.   oxyCODONE-acetaminophen 5-325 MG tablet Commonly known as:  PERCOCET Take 1 tablet by mouth every 4 (four) hours as needed.   prochlorperazine 10 MG tablet Commonly known as:  COMPAZINE Take 1 tablet (10 mg total) by mouth every 6 (six) hours as needed (Nausea or vomiting).   ZOLOFT 100 MG tablet Generic drug:  sertraline Take 200 mg by mouth daily.        Disposition and Follow-up: Follow up next Monday at cancer center  Significant Diagnostic Studies:  Dg Chest 2 View  Result Date: 10/05/2017 CLINICAL DATA:  Shortness of breath and back discomfort. History of breast cancer, currently on chemotherapy. EXAM: CHEST  2 VIEW COMPARISON:  Chest x-ray dated September 12, 2017. FINDINGS: Unchanged left chest wall port catheter with the tip in the distal superior vena cava. The cardiomediastinal silhouette is normal in size. Normal pulmonary vascularity. No focal consolidation, pleural effusion, or pneumothorax. No acute osseous abnormality. Unchanged surgical clips in the right axilla. IMPRESSION: No active cardiopulmonary disease. Electronically Signed   By: Titus Dubin M.D.   On: 10/05/2017 10:36   Dg Lumbar Spine Complete  Result Date: 10/05/2017 CLINICAL DATA:  She c/o low back pain since receiving chemo for  breast cancer and Neulasta 5-6 days ago. She further reports shortness of breath since yesterday evening, which persists. H/o osteopenia. EXAM: LUMBAR SPINE - COMPLETE 4+ VIEW COMPARISON:  Abdominopelvic CT of 09/05/2017 FINDINGS: Mild osteopenia. Five lumbar type vertebral bodies. Maintenance of vertebral body height and alignment. Loss of intervertebral disc height, primarily at the lumbosacral junction. Facet  arthropathy at L4-5 and L5-S1. No focal osseous lesion. IMPRESSION: Lumbosacral spondylosis, without acute superimposed process. Electronically Signed   By: Abigail Miyamoto M.D.   On: 10/05/2017 12:31   Ct Angio Chest Pe W And/or Wo Contrast  Result Date: 10/05/2017 CLINICAL DATA:  Shortness of breath. Positive D-dimer. Suspected pulmonary embolism. Currently undergoing chemotherapy for right breast carcinoma. EXAM: CT ANGIOGRAPHY CHEST WITH CONTRAST TECHNIQUE: Multidetector CT imaging of the chest was performed using the standard protocol during bolus administration of intravenous contrast. Multiplanar CT image reconstructions and MIPs were obtained to evaluate the vascular anatomy. CONTRAST:  <See Chart> ISOVUE-370 IOPAMIDOL (ISOVUE-370) INJECTION 76% COMPARISON:  None. FINDINGS: Cardiovascular: Satisfactory opacification of pulmonary arteries noted, and no pulmonary emboli identified. No evidence of thoracic aortic dissection or aneurysm. Mediastinum/Nodes: No masses or pathologically enlarged lymph nodes identified. Prior right mastectomy and axillary lymph node dissection. No axillary lymphadenopathy identified. Lungs/Pleura: No pulmonary mass, infiltrate, or effusion. Upper abdomen: No acute findings. Musculoskeletal: No suspicious bone lesions identified. Review of the MIP images confirms the above findings. IMPRESSION: Negative. No evidence of pulmonary embolism or other active disease within the thorax. Electronically Signed   By: Earle Gell M.D.   On: 10/05/2017 15:58   Ir Cv Line Injection  Result Date: 09/30/2017 INDICATION: Port malfunction, breast cancer, receiving chemotherapy EXAM: Right subclavian port catheter injection MEDICATIONS: None. ANESTHESIA/SEDATION: None. FLUOROSCOPY TIME:  Fluoroscopy Time: 6 seconds (11 mGy). COMPLICATIONS: None immediate. PROCEDURE: Informed written consent was obtained from the patient after a thorough discussion of the procedural risks, benefits and  alternatives. All questions were addressed. Maximal Sterile Barrier Technique was utilized including caps, mask, sterile gowns, sterile gloves, sterile drape, hand hygiene and skin antiseptic. A timeout was performed prior to the initiation of the procedure. Under sterile conditions, the left chest subclavian power port catheter was accessed. Contrast injection performed under fluoroscopy. Subtraction imaging obtained. Left subclavian port catheter is patent. Tip in the mid SVC. Catheter tip fibrin sheath noted. Despite this, there is preserved patency of the port catheter and blood aspirated easily. Catheter ready for use. Needle will remain in place. IMPRESSION: Widely patent left subclavian power port catheter. Tip mid SVC. Catheter tip fibrin sheath evident as detailed above. Access needle remains for use later today. Electronically Signed   By: Jerilynn Mages.  Shick M.D.   On: 09/30/2017 16:03   Dg Chest Port 1 View  Result Date: 09/12/2017 CLINICAL DATA:  Port-A-Cath placement. History of right-sided breast carcinoma EXAM: PORTABLE CHEST 1 VIEW COMPARISON:  Chest CT September 05, 2017 FINDINGS: Port-A-Cath tip is in the superior vena cava.  No pneumothorax. There is no edema or consolidation. Heart size and pulmonary vascular normal. No adenopathy. There are surgical clips the right axillary region. No evident bone lesions. IMPRESSION: Port-A-Cath tip in superior vena cava. No pneumothorax. Lungs clear. Heart size normal. Surgical clips right axillary region. Electronically Signed   By: Lowella Grip III M.D.   On: 09/12/2017 09:30   Dg C-arm 1-60 Min-no Report  Result Date: 09/12/2017 Fluoroscopy was utilized by the requesting physician.  No radiographic interpretation.    Discharge Laboratory Values: CBC Latest Ref Rng &  Units 10/08/2017 10/05/2017 09/30/2017  WBC 4.0 - 10.5 K/uL 0.8(LL) 9.9 12.6(H)  Hemoglobin 12.0 - 15.0 g/dL 10.5(L) 11.5(L) 11.3(L)  Hematocrit 36.0 - 46.0 % 30.4(L) 33.5(L) 34.3(L)   Platelets 150 - 400 K/uL 178 185 183     Brief H and P: For complete details please refer to admission H and P, but in brief, Mrs.Villanueva was admitted to hospital with failure to thrive, dehydration, nausea and severe anxiety. She hadnt slept in several days. She was also having intractable low back pain. She was given pain meds (percocet) and muscle relaxant (flexeril) which relieved the pain and got her comfortable that she was able to sleep and shes being discharged. Labs revealed neutropenia which is due to chemo and is expected.  Physical Exam at Discharge: BP 127/64 (BP Location: Left Arm)   Pulse (!) 109   Temp 98.4 F (36.9 C) (Oral)   Resp 18   Ht 5\' 1"  (1.549 m)   Wt 146 lb (66.2 kg)   LMP 07/30/1998   SpO2 98%   BMI 27.59 kg/m  PYP:PJKDT alert and smiling Cardiovascular:S1S2 Normal Respiratory:Clear Gastrointestinal:No abd distention Extremities:No edema  Hospital Course:  Active Problems:   Malignant neoplasm of upper-inner quadrant of right breast in female, estrogen receptor positive ([facility])   Insomnia   Dehydration   Diet:  Regular   Activity:  As tolerated  Condition at Discharge:   Stable.   Signed Harriette Ohara, MD  10/09/2017, 7:57 AM

## 2017-10-13 NOTE — Assessment & Plan Note (Signed)
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  Mammaprint high risk: Dose since Adriamycin and Cytoxan x4 followed by Taxol weekly x12 started 09/23/2017 2. adjuvant radiation therapy 3.  Followed by adjuvant antiestrogen therapy -------------------------------------------------------------------------------------------------------------------------------------- Current treatment: Cycle 2  dose dense Adriamycin and Cytoxan, stopped for toxicities and hospitalization. Taxol cycle 1 Echocardiogram 09/09/2017: EF 65-70%  Chemo toxicities: 1.  Severe insomnia: 2. Dehydration 3.  Low back pain  4.  Severe fatigue  5.  nausea:  RTC in 1 week for cycle 2 Taxol

## 2017-10-14 ENCOUNTER — Inpatient Hospital Stay: Payer: Medicare Other

## 2017-10-14 ENCOUNTER — Inpatient Hospital Stay (HOSPITAL_BASED_OUTPATIENT_CLINIC_OR_DEPARTMENT_OTHER): Payer: Medicare Other | Admitting: Hematology and Oncology

## 2017-10-14 ENCOUNTER — Inpatient Hospital Stay: Payer: Medicare Other | Attending: Hematology and Oncology

## 2017-10-14 VITALS — BP 142/75 | HR 103 | Temp 99.1°F | Resp 19

## 2017-10-14 VITALS — BP 128/69 | HR 107 | Temp 97.6°F | Resp 20 | Wt 137.0 lb

## 2017-10-14 DIAGNOSIS — R5383 Other fatigue: Secondary | ICD-10-CM | POA: Diagnosis not present

## 2017-10-14 DIAGNOSIS — R251 Tremor, unspecified: Secondary | ICD-10-CM

## 2017-10-14 DIAGNOSIS — M545 Low back pain: Secondary | ICD-10-CM | POA: Diagnosis not present

## 2017-10-14 DIAGNOSIS — R110 Nausea: Secondary | ICD-10-CM | POA: Insufficient documentation

## 2017-10-14 DIAGNOSIS — Z7982 Long term (current) use of aspirin: Secondary | ICD-10-CM | POA: Diagnosis not present

## 2017-10-14 DIAGNOSIS — R531 Weakness: Secondary | ICD-10-CM | POA: Insufficient documentation

## 2017-10-14 DIAGNOSIS — C50211 Malignant neoplasm of upper-inner quadrant of right female breast: Secondary | ICD-10-CM

## 2017-10-14 DIAGNOSIS — Z5111 Encounter for antineoplastic chemotherapy: Secondary | ICD-10-CM | POA: Diagnosis not present

## 2017-10-14 DIAGNOSIS — G4700 Insomnia, unspecified: Secondary | ICD-10-CM

## 2017-10-14 DIAGNOSIS — R000 Tachycardia, unspecified: Secondary | ICD-10-CM | POA: Insufficient documentation

## 2017-10-14 DIAGNOSIS — Z170 Estrogen receptor positive status [ER+]: Secondary | ICD-10-CM

## 2017-10-14 DIAGNOSIS — Z9011 Acquired absence of right breast and nipple: Secondary | ICD-10-CM | POA: Diagnosis not present

## 2017-10-14 DIAGNOSIS — Z79899 Other long term (current) drug therapy: Secondary | ICD-10-CM

## 2017-10-14 DIAGNOSIS — Z95828 Presence of other vascular implants and grafts: Secondary | ICD-10-CM

## 2017-10-14 DIAGNOSIS — E860 Dehydration: Secondary | ICD-10-CM

## 2017-10-14 DIAGNOSIS — F329 Major depressive disorder, single episode, unspecified: Secondary | ICD-10-CM | POA: Insufficient documentation

## 2017-10-14 LAB — COMPREHENSIVE METABOLIC PANEL
ALT: 23 U/L (ref 0–55)
AST: 15 U/L (ref 5–34)
Albumin: 3.9 g/dL (ref 3.5–5.0)
Alkaline Phosphatase: 136 U/L (ref 40–150)
Anion gap: 9 (ref 3–11)
BUN: 12 mg/dL (ref 7–26)
CO2: 25 mmol/L (ref 22–29)
Calcium: 9.3 mg/dL (ref 8.4–10.4)
Chloride: 103 mmol/L (ref 98–109)
Creatinine, Ser: 0.82 mg/dL (ref 0.60–1.10)
GFR calc Af Amer: >60 mL/min (ref >60)
GFR calc non Af Amer: >60 mL/min (ref >60)
Glucose, Bld: 105 mg/dL (ref 70–140)
Potassium: 3.4 mmol/L (ref 3.3–4.7)
Sodium: 137 mmol/L (ref 136–145)
Total Bilirubin: <0.2 mg/dL — ABNORMAL LOW (ref 0.2–1.2)
Total Protein: 7 g/dL (ref 6.4–8.3)

## 2017-10-14 LAB — CBC WITH DIFFERENTIAL/PLATELET
Abs Granulocyte: 11.3 10*3/uL — ABNORMAL HIGH (ref 1.5–6.5)
Basophils Absolute: 0 10*3/uL (ref 0.0–0.1)
Basophils Relative: 0 %
Eosinophils Absolute: 0 10*3/uL (ref 0.0–0.5)
Eosinophils Relative: 0 %
HCT: 31.9 % — ABNORMAL LOW (ref 34.8–46.6)
Hemoglobin: 10.9 g/dL — ABNORMAL LOW (ref 11.6–15.9)
Lymphocytes Relative: 10 %
Lymphs Abs: 1.5 10*3/uL (ref 0.9–3.3)
MCH: 29.6 pg (ref 25.1–34.0)
MCHC: 34.2 g/dL (ref 31.5–36.0)
MCV: 86.7 fL (ref 79.5–101.0)
Monocytes Absolute: 1.3 10*3/uL — ABNORMAL HIGH (ref 0.1–0.9)
Monocytes Relative: 9 %
Neutro Abs: 11.3 10*3/uL — ABNORMAL HIGH (ref 1.5–6.5)
Neutrophils Relative %: 81 %
Platelets: 194 10*3/uL (ref 145–400)
RBC: 3.68 MIL/uL — ABNORMAL LOW (ref 3.70–5.45)
RDW: 13.9 % (ref 11.2–16.1)
WBC: 14.2 10*3/uL — ABNORMAL HIGH (ref 3.9–10.3)

## 2017-10-14 MED ORDER — SODIUM CHLORIDE 0.9% FLUSH
10.0000 mL | INTRAVENOUS | Status: DC | PRN
  Administered 2017-10-14: 10 mL via INTRAVENOUS
  Filled 2017-10-14: qty 10

## 2017-10-14 MED ORDER — HEPARIN SOD (PORK) LOCK FLUSH 100 UNIT/ML IV SOLN
500.0000 [IU] | Freq: Once | INTRAVENOUS | Status: AC | PRN
  Administered 2017-10-14: 500 [IU] via INTRAVENOUS
  Filled 2017-10-14: qty 5

## 2017-10-14 MED ORDER — SODIUM CHLORIDE 0.9 % IV SOLN
Freq: Once | INTRAVENOUS | Status: DC
  Administered 2017-10-14: 16:00:00 via INTRAVENOUS

## 2017-10-14 NOTE — Patient Instructions (Addendum)
Dehydration, Adult Dehydration is when there is not enough fluid or water in your body. This happens when you lose more fluids than you take in. Dehydration can range from mild to very bad. It should be treated right away to keep it from getting very bad. Symptoms of mild dehydration may include:  Thirst.  Dry lips.  Slightly dry mouth.  Dry, warm skin.  Dizziness. Symptoms of moderate dehydration may include:  Very dry mouth.  Muscle cramps.  Dark pee (urine). Pee may be the color of tea.  Your body making less pee.  Your eyes making fewer tears.  Heartbeat that is uneven or faster than normal (palpitations).  Headache.  Light-headedness, especially when you stand up from sitting.  Fainting (syncope). Symptoms of very bad dehydration may include:  Changes in skin, such as: ? Cold and clammy skin. ? Blotchy (mottled) or pale skin. ? Skin that does not quickly return to normal after being lightly pinched and let go (poor skin turgor).  Changes in body fluids, such as: ? Feeling very thirsty. ? Your eyes making fewer tears. ? Not sweating when body temperature is high, such as in hot weather. ? Your body making very little pee.  Changes in vital signs, such as: ? Weak pulse. ? Pulse that is more than 100 beats a minute when you are sitting still. ? Fast breathing. ? Low blood pressure.  Other changes, such as: ? Sunken eyes. ? Cold hands and feet. ? Confusion. ? Lack of energy (lethargy). ? Trouble waking up from sleep. ? Short-term weight loss. ? Unconsciousness. Follow these instructions at home:  If told by your doctor, drink an ORS: ? Make an ORS by using instructions on the package. ? Start by drinking small amounts, about  cup (120 mL) every 5-10 minutes. ? Slowly drink more until you have had the amount that your doctor said to have.  Drink enough clear fluid to keep your pee clear or pale yellow. If you were told to drink an ORS, finish the ORS  first, then start slowly drinking clear fluids. Drink fluids such as: ? Water. Do not drink only water by itself. Doing that can make the salt (sodium) level in your body get too low (hyponatremia). ? Ice chips. ? Fruit juice that you have added water to (diluted). ? Low-calorie sports drinks.  Avoid: ? Alcohol. ? Drinks that have a lot of sugar. These include high-calorie sports drinks, fruit juice that does not have water added, and soda. ? Caffeine. ? Foods that are greasy or have a lot of fat or sugar.  Take over-the-counter and prescription medicines only as told by your doctor.  Do not take salt tablets. Doing that can make the salt level in your body get too high (hypernatremia).  Eat foods that have minerals (electrolytes). Examples include bananas, oranges, potatoes, tomatoes, and spinach.  Keep all follow-up visits as told by your doctor. This is important. Contact a doctor if:  You have belly (abdominal) pain that: ? Gets worse. ? Stays in one area (localizes).  You have a rash.  You have a stiff neck.  You get angry or annoyed more easily than normal (irritability).  You are more sleepy than normal.  You have a harder time waking up than normal.  You feel: ? Weak. ? Dizzy. ? Very thirsty.  You have peed (urinated) only a small amount of very dark pee during 6-8 hours. Get help right away if:  You have symptoms of   very bad dehydration.  You cannot drink fluids without throwing up (vomiting).  Your symptoms get worse with treatment.  You have a fever.  You have a very bad headache.  You are throwing up or having watery poop (diarrhea) and it: ? Gets worse. ? Does not go away.  You have blood or something green (bile) in your throw-up.  You have blood in your poop (stool). This may cause poop to look black and tarry.  You have not peed in 6-8 hours.  You pass out (faint).  Your heart rate when you are sitting still is more than 100 beats a  minute.  You have trouble breathing. This information is not intended to replace advice given to you by your health care provider. Make sure you discuss any questions you have with your health care provider. Document Released: 07/21/2009 Document Revised: 04/13/2016 Document Reviewed: 11/18/2015 Elsevier Interactive Patient Education  2018 [employer]  Rehydration, Adult Rehydration is the replacement of body fluids and salts and minerals (electrolytes) that are lost during dehydration. Dehydration is when there is not enough fluid or water in the body. This happens when you lose more fluids than you take in. Common causes of dehydration include:  Vomiting.  Diarrhea.  Excessive sweating, such as from heat exposure or exercise.  Taking medicines that cause the body to lose excess fluid (diuretics).  Impaired kidney function.  Not drinking enough fluid.  Certain illnesses or infections.  Certain poorly controlled long-term (chronic) illnesses, such as diabetes, heart disease, and kidney disease.  Symptoms of mild dehydration may include thirst, dry lips and mouth, dry skin, and dizziness. Symptoms of severe dehydration may include increased heart rate, confusion, fainting, and not urinating. You can rehydrate by drinking certain fluids or getting fluids through an IV tube, as told by your health care provider. What are the risks? Generally, rehydration is safe. However, one problem that can happen is taking in too much fluid (overhydration). This is rare. If overhydration happens, it can cause an electrolyte imbalance, kidney failure, or a decrease in salt (sodium) levels in the body. How to rehydrate Follow instructions from your health care provider for rehydration. The kind of fluid you should drink and the amount you should drink depend on your condition.  If directed by your health care provider, drink an oral rehydration solution (ORS). This is a drink designed to treat  dehydration that is found in pharmacies and retail stores. ? Make an ORS by following instructions on the package. ? Start by drinking small amounts, about  cup (120 mL) every 5-10 minutes. ? Slowly increase how much you drink until you have taken the amount recommended by your health care provider.  Drink enough clear fluids to keep your urine clear or pale yellow. If you were instructed to drink an ORS, finish the ORS first, then start slowly drinking other clear fluids. Drink fluids such as: ? Water. Do not drink only water. Doing that can lead to having too little sodium in your body (hyponatremia). ? Ice chips. ? Fruit juice that you have added water to (diluted juice). ? Low-calorie sports drinks.  If you are severely dehydrated, your health care provider may recommend that you receive fluids through an IV tube in the hospital.  Do not take sodium tablets. Doing that can lead to the condition of having too much sodium in your body (hypernatremia).  Eating while you rehydrate Follow instructions from your health care provider about what to eat while you   rehydrate. Your health care provider may recommend that you slowly begin eating regular foods in small amounts.  Eat foods that contain a healthy balance of electrolytes, such as bananas, oranges, potatoes, tomatoes, and spinach.  Avoid foods that are greasy or contain a lot of fat or sugar.  In some cases, you may get nutrition through a feeding tube that is passed through your nose and into your stomach (nasogastric tube, or NG tube). This may be done if you have uncontrolled vomiting or diarrhea. Beverages to avoid Certain beverages may make dehydration worse. While you rehydrate, avoid:  Alcohol.  Caffeine.  Drinks that contain a lot of sugar. These include: ? High-calorie sports drinks. ? Fruit juice that is not diluted. ? Soda.  Check nutrition labels to see how much sugar or caffeine a beverage contains. Signs of  dehydration recovery You may be recovering from dehydration if:  You are urinating more often than before you started rehydrating.  Your urine is clear or pale yellow.  Your energy level improves.  You vomit less frequently.  You have diarrhea less frequently.  Your appetite improves or returns to normal.  You feel less dizzy or less light-headed.  Your skin tone and color start to look more normal.  Contact a health care provider if:  You continue to have symptoms of mild dehydration, such as: ? Thirst. ? Dry lips. ? Slightly dry mouth. ? Dry, warm skin. ? Dizziness.  You continue to vomit or have diarrhea. Get help right away if:  You have symptoms of dehydration that get worse.  You feel: ? Confused. ? Weak. ? Like you are going to faint.  You have not urinated in 6-8 hours.  You have very dark urine.  You have trouble breathing.  Your heart rate while sitting still is over 100 beats a minute.  You cannot drink fluids without vomiting.  You have vomiting or diarrhea that: ? Gets worse. ? Does not go away.  You have a fever. This information is not intended to replace advice given to you by your health care provider. Make sure you discuss any questions you have with your health care provider. Document Released: 12/17/2011 Document Revised: 04/13/2016 Document Reviewed: 11/18/2015 Elsevier Interactive Patient Education  2018 [employer]  

## 2017-10-14 NOTE — Patient Instructions (Signed)
Implanted Port Home Guide An implanted port is a type of central line that is placed under the skin. Central lines are used to provide IV access when treatment or nutrition needs to be given through a person's veins. Implanted ports are used for long-term IV access. An implanted port may be placed because:  You need IV medicine that would be irritating to the small veins in your hands or arms.  You need long-term IV medicines, such as antibiotics.  You need IV nutrition for a long period.  You need frequent blood draws for lab tests.  You need dialysis.  Implanted ports are usually placed in the chest area, but they can also be placed in the upper arm, the abdomen, or the leg. An implanted port has two main parts:  Reservoir. The reservoir is round and will appear as a small, raised area under your skin. The reservoir is the part where a needle is inserted to give medicines or draw blood.  Catheter. The catheter is a thin, flexible tube that extends from the reservoir. The catheter is placed into a large vein. Medicine that is inserted into the reservoir goes into the catheter and then into the vein.  How will I care for my incision site? Do not get the incision site wet. Bathe or shower as directed by your health care provider. How is my port accessed? Special steps must be taken to access the port:  Before the port is accessed, a numbing cream can be placed on the skin. This helps numb the skin over the port site.  Your health care provider uses a sterile technique to access the port. ? Your health care provider must put on a mask and sterile gloves. ? The skin over your port is cleaned carefully with an antiseptic and allowed to dry. ? The port is gently pinched between sterile gloves, and a needle is inserted into the port.  Only "non-coring" port needles should be used to access the port. Once the port is accessed, a blood return should be checked. This helps ensure that the port  is in the vein and is not clogged.  If your port needs to remain accessed for a constant infusion, a clear (transparent) bandage will be placed over the needle site. The bandage and needle will need to be changed every week, or as directed by your health care provider.  Keep the bandage covering the needle clean and dry. Do not get it wet. Follow your health care provider's instructions on how to take a shower or bath while the port is accessed.  If your port does not need to stay accessed, no bandage is needed over the port.  What is flushing? Flushing helps keep the port from getting clogged. Follow your health care provider's instructions on how and when to flush the port. Ports are usually flushed with saline solution or a medicine called heparin. The need for flushing will depend on how the port is used.  If the port is used for intermittent medicines or blood draws, the port will need to be flushed: ? After medicines have been given. ? After blood has been drawn. ? As part of routine maintenance.  If a constant infusion is running, the port may not need to be flushed.  How long will my port stay implanted? The port can stay in for as long as your health care provider thinks it is needed. When it is time for the port to come out, surgery will be   done to remove it. The procedure is similar to the one performed when the port was put in. When should I seek immediate medical care? When you have an implanted port, you should seek immediate medical care if:  You notice a bad smell coming from the incision site.  You have swelling, redness, or drainage at the incision site.  You have more swelling or pain at the port site or the surrounding area.  You have a fever that is not controlled with medicine.  This information is not intended to replace advice given to you by your health care provider. Make sure you discuss any questions you have with your health care provider. Document  Released: 09/24/2005 Document Revised: 03/01/2016 Document Reviewed: 06/01/2013 Elsevier Interactive Patient Education  2017 [employer]  

## 2017-10-14 NOTE — Progress Notes (Signed)
Pt will not receive chemo today per Dr.[name]. Pt will receive IVF's today. May infuse over 1 hr. Notified infusion and pharmacy.

## 2017-10-14 NOTE — Progress Notes (Signed)
Patient Care Team: [name], MD as PCP - General (Family Medicine) [name], MD as Consulting Physician (General Surgery) Nicholas Lose, MD as Consulting Physician (Hematology and Oncology) Kyung Rudd, MD as Consulting Physician (Radiation Oncology)  DIAGNOSIS:  Encounter Diagnoses  Name Primary?  . Malignant neoplasm of upper-inner quadrant of right breast in female, estrogen receptor positive ([place])   . Dehydration Yes    SUMMARY OF ONCOLOGIC HISTORY:   Malignant neoplasm of upper-inner quadrant of right breast in female, estrogen receptor positive ([place])   06/21/2017 Initial Diagnosis    Screening detected right breast mass and calcifications 1 cm at 1:00 position biopsy of mass and calcifications revealed invasive lobular cancer with pleomorphic features, grade 2, axillary lymph node biopsy positive for cancer, ER/PR positive HER-2 negative with a Ki-67 of 40%, T1b N1 stage IB AJCC 8      08/06/2017 Surgery    Right mastectomy: Scattered foci of invasive lobular cancer, grade 2, 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, LCIS, margins negative, 2/6 lymph nodes +1 with isolated tumor cells, T1BN1 stage I the AJCC 8 ER 95%, PR 30%, HER-2 negative, Ki-67 40%      08/12/2017 Miscellaneous    Mammaprint high risk luminal type B      09/17/2017 -  Chemotherapy    Adjuvant chemo with dose dense Adriamycin and Cytoxan X 2 (Stopped due to toxicities) followed by Taxol weekly x12      10/07/2017 - 10/09/2017 Hospital Admission    Adm for failure to throve, insomnia and back pain       CHIEF COMPLIANT: Follow-up after recent hospitalization, complains of tremors, generalized weakness, low back pain  INTERVAL HISTORY: Gloria Ware is a 72 year old who received 2 cycles of chemotherapy with Adriamycin and Cytoxan and is here today after recent hospitalization for generalized fatigue and weakness and difficulty with sleeping along with low back pain.  She appears to be doing moderately well.   The back pain does come back intermittently at a low grade.  She is using Flexeril and Tylenol for the pain.  This appears to be working very well.  She does not need Percocet for pain relief.  She has not even filled the prescription.  She has been eating reasonably well.  However she feels extremely weak.  She is also fairly anxious.  REVIEW OF SYSTEMS:   Constitutional: Denies fevers, chills or abnormal weight loss Eyes: Denies blurriness of vision Ears, nose, mouth, throat, and face: Denies mucositis or sore throat Respiratory: Denies cough, dyspnea or wheezes Cardiovascular: Denies palpitation, chest discomfort Gastrointestinal:  Denies nausea, heartburn or change in bowel habits Skin: Denies abnormal skin rashes Lymphatics: Denies new lymphadenopathy or easy bruising Neurological: Tremors and generalized weakness Behavioral/Psych: Anxiety and depressed mood Extremities: No lower extremity edema Breast:  denies any pain or lumps or nodules in either breasts All other systems were reviewed with the patient and are negative.  I have reviewed the past medical history, past surgical history, social history and family history with the patient and they are unchanged from previous note.  ALLERGIES:  is allergic to penicillins; sulfa antibiotics; atorvastatin; and zetia [ezetimibe].  MEDICATIONS:  Current Outpatient Medications  Medication Sig Dispense Refill  . aspirin 81 MG tablet Take 81 mg by mouth daily.      . Calcium Carbonate-Vitamin D (CALTRATE 600+D PO) Take 1 tablet by mouth 2 (two) times daily.    . cyclobenzaprine (FLEXERIL) 5 MG tablet Take 1 tablet (5 mg total) by mouth  3 (three) times daily as needed for muscle spasms. 30 tablet 0  . lidocaine-prilocaine (EMLA) cream Apply to affected area once 30 g 3  . LORazepam (ATIVAN) 0.5 MG tablet TAKE 1 TABLET AT BEDTIME AS NEEDED FOR NAUSEA OR VOMITING  0  . ondansetron (ZOFRAN) 8 MG tablet TAKE 1 TABLET TWICE A DAY AS NEEDED FOR  NAUSEA AND VOMITING. START ON THE 3RD DAY AFTER CHEMOTHERAPY.  1  . oxyCODONE-acetaminophen (PERCOCET) 5-325 MG tablet Take 1 tablet by mouth every 4 (four) hours as needed. 60 tablet 0  . prochlorperazine (COMPAZINE) 10 MG tablet Take 1 tablet (10 mg total) by mouth every 6 (six) hours as needed (Nausea or vomiting). 90 tablet 0  . sertraline (ZOLOFT) 100 MG tablet Take 200 mg by mouth daily.      Current Facility-Administered Medications  Medication Dose Route Frequency Provider Last Rate Last Dose  . 0.9 %  sodium chloride infusion   Intravenous Once Nicholas Lose, MD        PHYSICAL EXAMINATION: ECOG PERFORMANCE STATUS: 1 - Symptomatic but completely ambulatory  Vitals:   10/14/17 1418  BP: 128/69  Pulse: (!) 107  Resp: 20  Temp: 97.6 F (36.4 C)  SpO2: 100%   Filed Weights   10/14/17 1418  Weight: 137 lb (62.1 kg)    GENERAL:alert, no distress and comfortable SKIN: skin color, texture, turgor are normal, no rashes or significant lesions EYES: normal, Conjunctiva are pink and non-injected, sclera clear OROPHARYNX:no exudate, no erythema and lips, buccal mucosa, and tongue normal  NECK: supple, thyroid normal size, non-tender, without nodularity LYMPH:  no palpable lymphadenopathy in the cervical, axillary or inguinal LUNGS: clear to auscultation and percussion with normal breathing effort HEART: regular rate & rhythm and no murmurs and no lower extremity edema ABDOMEN:abdomen soft, non-tender and normal bowel sounds MUSCULOSKELETAL:no cyanosis of digits and no clubbing  NEURO: alert & oriented x 3 with fluent speech, no focal motor/sensory deficits EXTREMITIES: No lower extremity edema  LABORATORY DATA:  I have reviewed the data as listed   Chemistry      Component Value Date/Time   NA 136 10/08/2017 1143   NA 138 09/30/2017 1242   K 3.4 (L) 10/08/2017 1143   K 4.0 09/30/2017 1242   CL 106 10/08/2017 1143   CO2 22 10/08/2017 1143   CO2 23 09/30/2017 1242   BUN  9 10/08/2017 1143   BUN 14.4 09/30/2017 1242   CREATININE 0.79 10/08/2017 1143   CREATININE 1.0 09/30/2017 1242      Component Value Date/Time   CALCIUM 8.8 (L) 10/08/2017 1143   CALCIUM 8.9 09/30/2017 1242   ALKPHOS 102 10/08/2017 1143   ALKPHOS 136 09/30/2017 1242   AST 19 10/08/2017 1143   AST 12 09/30/2017 1242   ALT 16 10/08/2017 1143   ALT 13 09/30/2017 1242   BILITOT 0.3 10/08/2017 1143   BILITOT <0.22 09/30/2017 1242       Lab Results  Component Value Date   WBC 14.2 (H) 10/14/2017   HGB 10.9 (L) 10/14/2017   HCT 31.9 (L) 10/14/2017   MCV 86.7 10/14/2017   PLT 194 10/14/2017   NEUTROABS 11.3 (H) 10/14/2017    ASSESSMENT & PLAN:  Malignant neoplasm of upper-inner quadrant of right breast in female, estrogen receptor positive ([place]) 08/06/2017 right mastectomy: Scattered foci of invasive lobular cancer, grade 2, 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, LCIS, margins negative, 2/6 lymph nodes +1 with isolated tumor cells, T1BN1 stage I the AJCC 8 ER  95%, PR 30%, HER-2 negative, Ki-67 40%  Treatment plan: 1.  Mammaprint high risk: Dose since Adriamycin and Cytoxan x4 followed by Taxol weekly x12 started 09/23/2017 2. adjuvant radiation therapy 3.  Followed by adjuvant antiestrogen therapy -------------------------------------------------------------------------------------------------------------------------------------- Current treatment: Cycle 2  dose dense Adriamycin and Cytoxan, stopped for toxicities and hospitalization. Taxol cycle 1 Echocardiogram 09/09/2017: EF 65-70%  Chemo toxicities: 1.  Severe insomnia: 2. Dehydration 3.  Low back pain  4.  Severe fatigue  5.  nausea:  Patient continues to have the above symptoms although they are much better.  I do not believe she is ready to start her treatment.  I discussed with her in the hospital that we will not give any more Adriamycin and Cytoxan and that we will plan to move towards giving her weekly Taxol treatment.  However  because she is still extremely weak, we will postpone Taxol by another week. We will administer IV fluids today with normal saline to help her dehydration.  I spent 25 minutes talking to the patient of which more than half was spent in counseling and coordination of care.  No orders of the defined types were placed in this encounter.  The patient has a good understanding of the overall plan. she agrees with it. she will call with any problems that may develop before the next visit here.   Harriette Ohara, MD 10/14/17

## 2017-10-15 ENCOUNTER — Other Ambulatory Visit: Payer: Self-pay

## 2017-10-15 DIAGNOSIS — C50211 Malignant neoplasm of upper-inner quadrant of right female breast: Secondary | ICD-10-CM

## 2017-10-15 DIAGNOSIS — Z170 Estrogen receptor positive status [ER+]: Principal | ICD-10-CM

## 2017-10-15 MED ORDER — PROCHLORPERAZINE MALEATE 10 MG PO TABS: 10.0000 mg | ORAL_TABLET | Freq: Four times a day (QID) | ORAL | 0 refills | Status: DC | PRN

## 2017-10-16 ENCOUNTER — Other Ambulatory Visit: Payer: Self-pay

## 2017-10-16 ENCOUNTER — Encounter: Payer: Self-pay | Admitting: General Practice

## 2017-10-16 ENCOUNTER — Ambulatory Visit: Payer: Medicare Other | Attending: General Surgery

## 2017-10-16 DIAGNOSIS — M6281 Muscle weakness (generalized): Secondary | ICD-10-CM | POA: Insufficient documentation

## 2017-10-16 DIAGNOSIS — R262 Difficulty in walking, not elsewhere classified: Secondary | ICD-10-CM | POA: Insufficient documentation

## 2017-10-16 DIAGNOSIS — M25611 Stiffness of right shoulder, not elsewhere classified: Secondary | ICD-10-CM | POA: Insufficient documentation

## 2017-10-16 DIAGNOSIS — R293 Abnormal posture: Secondary | ICD-10-CM

## 2017-10-16 NOTE — Therapy (Signed)
[place], [place], [address] Phone: [phone]   Fax:  [phone]  Physical Therapy Treatment  Patient Details  Name: [name] MRN: [id number] Date of Birth: 1945-12-26 Referring Provider: Dr. [name]   Encounter Date: 10/16/2017  PT End of Session - 10/16/17 1459    Visit Number  5 No treatment today so carried kept session # same    Number of Visits  21    Date for PT Re-Evaluation  12/03/17    PT Start Time  1435 No charge    PT Stop Time  1450    PT Time Calculation (min)  15 min    Activity Tolerance  Patient limited by pain    Behavior During Therapy  Anxious;Restless       Past Medical History:  Diagnosis Date  . Anxiety   . Breast CA ([place])   . Colon polyp 09/12/2005  . Depression   . Eczema    right forearm   . Family history of breast cancer   . Genetic testing 06/27/2017   Multi-Cancer panel (83 genes) @ [facility] - No pathogenic mutations detected  . Hearing loss   . High cholesterol   . Injury     left foot metatarsal fracture sustined 08-03-17 , treated with boot; patient  reports  improvement as of today  . Osteopenia 11/2016   T score -2.1 stable from prior DEXA  . PONV (postoperative nausea and vomiting)    post op nausea  . Tinnitus     Past Surgical History:  Procedure Laterality Date  . CARDIAC CATHETERIZATION     husband was having symtpoms and needed a stent; subsequently she states she started " to feel something"  but thinks it was " all in my head bc of my hbsand". subsequently saw same cardiologist as her husband who sent for treadmill stress that had inconcluisicve results so she was sent for cardiac catherization which she states was " definitely negative "   . cataract surg    . DILATION AND CURETTAGE OF UTERUS    . IR CV LINE INJECTION  09/30/2017  . MASTECTOMY W/ SENTINEL NODE BIOPSY Right 08/06/2017   Procedure: RIGHT MASTECTOMY WITH SENTINEL  LYMPH NODE BIOPSY;  Surgeon: [name], MD;  Location: [facility];  Service: General;  Laterality: Right;  . MOUTH SURGERY    . PORTACATH PLACEMENT N/A 09/12/2017   Procedure: INSERTION PORT-A-CATH;  Surgeon: [name], MD;  Location: [facility];  Service: General;  Laterality: N/A;  . RADIOACTIVE SEED GUIDED AXILLARY SENTINEL LYMPH NODE Right 08/06/2017   Procedure: SEED TARGETED AXILLARY LYMPH NODE EXCISION;  Surgeon: [name], MD;  Location: [facility];  Service: General;  Laterality: Right;    There were no vitals filed for this visit.  Subjective Assessment - 10/16/17 1437    Subjective  I am having 10/10 back pain today and this has been happening since my last Neulasta treatment 2 weeks ago. Flexoril and Tylenol help but the pain always comes back and now I am having LE tremors as well. It's starting to take an emotional toll on me and I want to talk to Dr. Landis Gandy office about a referral to talk to a therapist/psychiatrist.    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 It measured 1 cm and  is located in the upper inner quadrant. 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 She underwent a right mastectomy and sentinel node biopsy on 08/06/17. She is wearing a boot on her left foot due to a fall and foot fracture 08/04/17. Her orthopedist for her foot is Dr. Doran Durand.     Patient Stated Goals  Make sure my arm is ok and that I'm strong enough for chemotherapy.    Currently in Pain?  Yes    Pain Score  10-Worst pain ever    Pain Location  Back    Pain Orientation  Lower    Pain Descriptors / Indicators  Shooting;Sharp;Aching    Pain Type  Acute pain    Pain Onset  1 to 4 weeks ago    Pain Frequency  Intermittent    Aggravating Factors   not sure    Pain Relieving Factors  Flexoril and Tylenol  help some for short periods of time                                    Breast Clinic Goals - 06/26/17 2119      Patient will be able to verbalize understanding of pertinent lymphedema risk reduction practices relevant to her diagnosis specifically related to skin care.   Time  1    Period  Days    Status  Achieved      Patient will be able to return demonstrate and/or verbalize understanding of the post-op home exercise program related to regaining shoulder range of motion.   Time  1    Period  Days    Status  Achieved      Patient will be able to verbalize understanding of the importance of attending the postoperative After Breast Cancer Class for further lymphedema risk reduction education and therapeutic exercise.   Time  1    Period  Days    Status  Achieved       Long Term Clinic Goals - 09/02/17 1504      CC Long Term Goal  #1   Title  Patient will perform home exercise program correctly and independently.    Time  8    Period  Weeks    Status  New      CC Long Term Goal  #2   Title  Patient will demonstrate right shoulder active flexion at >/= 130 degrees for increased ease reaching overhead.    Baseline  111 degrees today    Time  8    Period  Weeks    Status  New      CC Long Term Goal  #3   Title  Patient will demonstrate right shoulder active abduction at >/= 137 degrees for increased ease reaching overhead.    Baseline  125 degrees today    Time  8    Period  Weeks    Status  New      CC Long Term Goal  #4   Title  Patient will report she is able to participate in a regular walking program for 30 minutes, 5-6 days per week for reducing recurrence risk and better tolerance of chemotherapy.    Baseline  Unable to walk long distance with boot on.    Time  8    Period  Weeks    Status  New      CC Long  Term Goal  #5   Title  Patient will verbalize understanding of risk reduction practices related to lymphedema.    Time  8     Period  Weeks    Status  New      CC Long Term Goal  #6   Title  Quick DASH score reduced to </= 4 for improved function of right arm.    Time  8    Period  Weeks    Status  New      Additional Goals   Additional Goals  Yes         Plan - 10/16/17 1500    Clinical Impression Statement  Pt came in c/o 10/10 back pain and was hoping she could try some therapy but after sitting in chair and updating therapist after a few minutes she decided she didn't want to try anything today (offered gentle stretching to try to ease muscle tension) so ended visit. Assisted pt to car as she was unable to ambulate without +1-2 HHA. They planned to call Dr. Landis Gandy office immediately and try to be seen today.    Rehab Potential  Excellent    Clinical Impairments Affecting Rehab Potential  Left foot fracture but no longer wearing boot; started chemo 09/16/17    PT Frequency  2x / week    PT Duration  8 weeks    PT Treatment/Interventions  Therapeutic exercise;Patient/family education;Balance training;Neuromuscular re-education;Gait training;ADLs/Self Care Home Management;Therapeutic activities;Functional mobility training;Manual techniques;Passive range of motion;Scar mobilization    PT Next Visit Plan  See how visit with Dr. Lindi Adie went regarding recent increased back pain. Cont Nustep and review HEP prn; cont Rt shoulder ROM and LE strength; rest breaks as needed between appts.    Consulted and Agree with Plan of Care  Patient    Family Member Consulted  Husband       Patient will benefit from skilled therapeutic intervention in order to improve the following deficits and impairments:  Postural dysfunction, Decreased knowledge of precautions, Pain, Impaired UE functional use, Decreased range of motion, Difficulty walking, Decreased activity tolerance, Decreased strength  Visit Diagnosis: Muscle weakness (generalized)  Abnormal posture  Difficulty in walking, not elsewhere classified     Problem  List Patient Active Problem List   Diagnosis Date Noted  . Insomnia 10/07/2017  . Dehydration 10/07/2017  . Port-A-Cath in place 09/30/2017  . Breast cancer metastasized to multiple sites, right ([place]) 08/06/2017  . Genetic testing 06/27/2017  . Family history of breast cancer   . Breast CA ([place])   . Malignant neoplasm of upper-inner quadrant of right breast in female, estrogen receptor positive ([place]) 06/26/2017   Offered treatment to pt to try gentle stretches to try to alleviate pain but she decided she wasn't going to be able to tolerate any treatment after all due to pain so no charge for today's visit.   Otelia Limes, PTA 10/16/2017, 5:25 PM  [place], [place], [address] Phone: [phone]   Fax:  [phone]  Name: VERDIE WILMS MRN: [id number] Date of Birth: [birth year]

## 2017-10-16 NOTE — Progress Notes (Signed)
[facility] Spiritual Care Note  Referred by Mateo Flow Dodd/RN for emotional support and potential referral resources for pt's self-reported anxiety. Reached out by phone, speaking with Kendall's husband, whom I met at [facility]. He will encourage Brettany to phone me back tomorrow for next steps.   [place], [place], [facility] Pager [phone] Voicemail [phone]

## 2017-10-16 NOTE — Progress Notes (Addendum)
Pt called stating that she was on her way to physical therapy today but unable to go through treatment due to back pain. Pt took flexeril with extra strength tylenol. Pt was on her way home and would like to know if she can take a Roxicodone medication that she had left from her surgery with Dr.Byerly. Told pt that she may take one when she gets home for breakthrough pain. Pt verbalized understanding.  Pt states that normally, flexeril and tylenol extra strength works for controlling her pain, but she is very anxious and feels that her pain had worsened. Pt also wants to seek some counseling for her severe anxiety due to recent cancer diagnosis and treatments.   Contacted social work and spoke with Lelon Frohlich, [place] regarding pt request and will call pt. No further needs at this time.   48- Pt husband called and would like to know if pt could have refill of roxicodone. He also states that pt has percocet at home, but had never taken this medication before. Pt prefers to take roxicodone since it is much more effective for her breakthrough pain. She takes 1 per day for breakthrough pain. Will discuss with Dr.[name] and pt may call back tomorrow and see if pt can get refill for pain medicine.   10/17/17-  Per Dr.[name], okay to have pt refill Roxicodone. Husband will be in sometime today to pick up prescription.

## 2017-10-17 ENCOUNTER — Other Ambulatory Visit: Payer: Self-pay

## 2017-10-17 MED ORDER — OXYCODONE HCL 5 MG PO TABS: 5.0000 mg | ORAL_TABLET | ORAL | 0 refills | Status: DC | PRN

## 2017-10-17 NOTE — Addendum Note (Signed)
Addended by: Thelma Barge MAY J on: 10/17/2017 10:22 AM   Modules accepted: Orders

## 2017-10-18 ENCOUNTER — Telehealth: Payer: Self-pay

## 2017-10-18 ENCOUNTER — Telehealth: Payer: Self-pay | Admitting: Hematology and Oncology

## 2017-10-18 NOTE — Telephone Encounter (Signed)
Spoke with pt over the phone and wanted to just verify that she will be seeing Dr.[name] on Monday before her infusion. Pt will no longer get AC due to toxicities. Pt will be getting first time Taxol on Monday. Pt verbalized understanding and confirmed her appts.  Pt also confirmed that she spoke with Lorrin Jackson and will call her back this afternoon. Pt wanted to know if she needs a referral to psychiatry. Told pt contact behavioral health to get an assessment if she would like. Pt verbalized understanding and feels that she would like to speak with Lattie Haw for now for emotional support. No further needs at this time.

## 2017-10-18 NOTE — Telephone Encounter (Signed)
Left message with appt date and time for patient per 1/7 los -

## 2017-10-18 NOTE — Telephone Encounter (Signed)
Received VM from pt regarding question about her upcoming appt.  Returned her call, no answer. Left her VM with our contact info to call us back .

## 2017-10-21 ENCOUNTER — Other Ambulatory Visit: Payer: Self-pay

## 2017-10-21 ENCOUNTER — Inpatient Hospital Stay: Payer: Medicare Other

## 2017-10-21 ENCOUNTER — Telehealth: Payer: Self-pay | Admitting: Hematology and Oncology

## 2017-10-21 ENCOUNTER — Ambulatory Visit: Payer: Medicare Other | Admitting: Radiation Oncology

## 2017-10-21 ENCOUNTER — Encounter: Payer: Medicare Other | Admitting: Physical Therapy

## 2017-10-21 ENCOUNTER — Encounter: Payer: Self-pay | Admitting: General Practice

## 2017-10-21 ENCOUNTER — Inpatient Hospital Stay (HOSPITAL_BASED_OUTPATIENT_CLINIC_OR_DEPARTMENT_OTHER): Payer: Medicare Other | Admitting: Hematology and Oncology

## 2017-10-21 VITALS — BP 142/70 | HR 94 | Temp 99.4°F | Resp 18

## 2017-10-21 DIAGNOSIS — F329 Major depressive disorder, single episode, unspecified: Secondary | ICD-10-CM | POA: Diagnosis not present

## 2017-10-21 DIAGNOSIS — C50211 Malignant neoplasm of upper-inner quadrant of right female breast: Secondary | ICD-10-CM

## 2017-10-21 DIAGNOSIS — Z79899 Other long term (current) drug therapy: Secondary | ICD-10-CM

## 2017-10-21 DIAGNOSIS — G4700 Insomnia, unspecified: Secondary | ICD-10-CM

## 2017-10-21 DIAGNOSIS — Z170 Estrogen receptor positive status [ER+]: Secondary | ICD-10-CM

## 2017-10-21 DIAGNOSIS — Z7982 Long term (current) use of aspirin: Secondary | ICD-10-CM

## 2017-10-21 DIAGNOSIS — E860 Dehydration: Secondary | ICD-10-CM | POA: Diagnosis not present

## 2017-10-21 DIAGNOSIS — R251 Tremor, unspecified: Secondary | ICD-10-CM | POA: Diagnosis not present

## 2017-10-21 DIAGNOSIS — Z5111 Encounter for antineoplastic chemotherapy: Secondary | ICD-10-CM

## 2017-10-21 DIAGNOSIS — R110 Nausea: Secondary | ICD-10-CM

## 2017-10-21 DIAGNOSIS — Z95828 Presence of other vascular implants and grafts: Secondary | ICD-10-CM

## 2017-10-21 DIAGNOSIS — M545 Low back pain: Secondary | ICD-10-CM

## 2017-10-21 DIAGNOSIS — Z9011 Acquired absence of right breast and nipple: Secondary | ICD-10-CM | POA: Diagnosis not present

## 2017-10-21 DIAGNOSIS — R000 Tachycardia, unspecified: Secondary | ICD-10-CM | POA: Diagnosis not present

## 2017-10-21 DIAGNOSIS — R531 Weakness: Secondary | ICD-10-CM

## 2017-10-21 DIAGNOSIS — R5383 Other fatigue: Secondary | ICD-10-CM | POA: Diagnosis not present

## 2017-10-21 LAB — CBC WITH DIFFERENTIAL/PLATELET
BASOS ABS: 0 10*3/uL (ref 0.0–0.1)
Basophils Relative: 0 %
Eosinophils Absolute: 0 10*3/uL (ref 0.0–0.5)
Eosinophils Relative: 0 %
HCT: 33.7 % — ABNORMAL LOW (ref 34.8–46.6)
HEMOGLOBIN: 11.3 g/dL — AB (ref 11.6–15.9)
Lymphocytes Relative: 15 %
Lymphs Abs: 0.8 10*3/uL — ABNORMAL LOW (ref 0.9–3.3)
MCH: 29.5 pg (ref 25.1–34.0)
MCHC: 33.5 g/dL (ref 31.5–36.0)
MCV: 88 fL (ref 79.5–101.0)
Monocytes Absolute: 0.7 10*3/uL (ref 0.1–0.9)
Monocytes Relative: 13 %
NEUTROS ABS: 3.8 10*3/uL (ref 1.5–6.5)
NEUTROS PCT: 72 %
PLATELETS: 305 10*3/uL (ref 145–400)
RBC: 3.83 MIL/uL (ref 3.70–5.45)
RDW: 14.6 % (ref 11.2–16.1)
WBC: 5.3 10*3/uL (ref 3.9–10.3)

## 2017-10-21 LAB — COMPREHENSIVE METABOLIC PANEL
ALT: 22 U/L (ref 0–55)
ANION GAP: 10 (ref 3–11)
AST: 17 U/L (ref 5–34)
Albumin: 4.1 g/dL (ref 3.5–5.0)
Alkaline Phosphatase: 89 U/L (ref 40–150)
BILIRUBIN TOTAL: 0.3 mg/dL (ref 0.2–1.2)
BUN: 15 mg/dL (ref 7–26)
CO2: 24 mmol/L (ref 22–29)
Calcium: 9.7 mg/dL (ref 8.4–10.4)
Chloride: 102 mmol/L (ref 98–109)
Creatinine, Ser: 0.84 mg/dL (ref 0.60–1.10)
Glucose, Bld: 108 mg/dL (ref 70–140)
POTASSIUM: 4.2 mmol/L (ref 3.3–4.7)
Sodium: 136 mmol/L (ref 136–145)
TOTAL PROTEIN: 7.3 g/dL (ref 6.4–8.3)

## 2017-10-21 MED ORDER — DEXAMETHASONE SODIUM PHOSPHATE 10 MG/ML IJ SOLN
10.0000 mg | Freq: Once | INTRAMUSCULAR | Status: AC
  Administered 2017-10-21: 10 mg via INTRAVENOUS

## 2017-10-21 MED ORDER — DIPHENHYDRAMINE HCL 50 MG/ML IJ SOLN
25.0000 mg | Freq: Once | INTRAMUSCULAR | Status: AC
  Administered 2017-10-21: 25 mg via INTRAVENOUS

## 2017-10-21 MED ORDER — DIPHENHYDRAMINE HCL 50 MG/ML IJ SOLN
INTRAMUSCULAR | Status: AC
  Filled 2017-10-21: qty 1

## 2017-10-21 MED ORDER — SODIUM CHLORIDE 0.9% FLUSH
10.0000 mL | INTRAVENOUS | Status: DC | PRN
  Administered 2017-10-21: 10 mL
  Filled 2017-10-21: qty 10

## 2017-10-21 MED ORDER — FAMOTIDINE IN NACL 20-0.9 MG/50ML-% IV SOLN
INTRAVENOUS | Status: AC
  Filled 2017-10-21: qty 50

## 2017-10-21 MED ORDER — HEPARIN SOD (PORK) LOCK FLUSH 100 UNIT/ML IV SOLN
500.0000 [IU] | Freq: Once | INTRAVENOUS | Status: AC | PRN
  Administered 2017-10-21: 500 [IU]
  Filled 2017-10-21: qty 5

## 2017-10-21 MED ORDER — SODIUM CHLORIDE 0.9 % IV SOLN: 10.0000 mg | Freq: Once | INTRAVENOUS | Status: DC

## 2017-10-21 MED ORDER — SODIUM CHLORIDE 0.9 % IV SOLN
Freq: Once | INTRAVENOUS | Status: AC
  Administered 2017-10-21: 11:00:00 via INTRAVENOUS

## 2017-10-21 MED ORDER — SODIUM CHLORIDE 0.9% FLUSH
10.0000 mL | INTRAVENOUS | Status: DC | PRN
  Administered 2017-10-21: 10 mL via INTRAVENOUS
  Filled 2017-10-21: qty 10

## 2017-10-21 MED ORDER — SODIUM CHLORIDE 0.9 % IV SOLN
45.0000 mg/m2 | Freq: Once | INTRAVENOUS | Status: AC
  Administered 2017-10-21: 78 mg via INTRAVENOUS
  Filled 2017-10-21: qty 13

## 2017-10-21 MED ORDER — LORAZEPAM 0.5 MG PO TABS: ORAL_TABLET | ORAL | 3 refills | Status: DC

## 2017-10-21 MED ORDER — DEXAMETHASONE SODIUM PHOSPHATE 10 MG/ML IJ SOLN
INTRAMUSCULAR | Status: AC
  Filled 2017-10-21: qty 1

## 2017-10-21 MED ORDER — FAMOTIDINE IN NACL 20-0.9 MG/50ML-% IV SOLN
20.0000 mg | Freq: Once | INTRAVENOUS | Status: AC
  Administered 2017-10-21: 20 mg via INTRAVENOUS

## 2017-10-21 NOTE — Progress Notes (Signed)
Patient Care Team: [name], MD as PCP - General (Family Medicine) [name], MD as Consulting Physician (General Surgery) Nicholas Lose, MD as Consulting Physician (Hematology and Oncology) Kyung Rudd, MD as Consulting Physician (Radiation Oncology)  DIAGNOSIS:  Encounter Diagnosis  Name Primary?  . Malignant neoplasm of upper-inner quadrant of right breast in female, estrogen receptor positive ([place])     SUMMARY OF ONCOLOGIC HISTORY:   Malignant neoplasm of upper-inner quadrant of right breast in female, estrogen receptor positive ([place])   06/21/2017 Initial Diagnosis    Screening detected right breast mass and calcifications 1 cm at 1:00 position biopsy of mass and calcifications revealed invasive lobular cancer with pleomorphic features, grade 2, axillary lymph node biopsy positive for cancer, ER/PR positive HER-2 negative with a Ki-67 of 40%, T1b N1 stage IB AJCC 8      08/06/2017 Surgery    Right mastectomy: Scattered foci of invasive lobular cancer, grade 2, 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, LCIS, margins negative, 2/6 lymph nodes +1 with isolated tumor cells, T1BN1 stage I the AJCC 8 ER 95%, PR 30%, HER-2 negative, Ki-67 40%      08/12/2017 Miscellaneous    Mammaprint high risk luminal type B      09/17/2017 -  Chemotherapy    Adjuvant chemo with dose dense Adriamycin and Cytoxan X 2 (Stopped due to toxicities) followed by Taxol weekly x12      10/07/2017 - 10/09/2017 Hospital Admission    Adm for failure to thrive, insomnia and back pain       CHIEF COMPLIANT: Cycle 1 Taxol  INTERVAL HISTORY: Gloria Ware is a 72 year old with above-mentioned history of right breast cancer who underwent mastectomy followed by adjuvant chemotherapy.  After second cycle of Adriamycin and Cytoxan she was hospitalized for failure to thrive along with low back pain.  She was discharged home but she is still was very weak and we had to postpone her treatment until today.  She reports to me  that the low back pain has resolved.  She is also sleeping well for the past 3 days.  She continues to remain weak and mostly unsteady in her gait.  She is afraid of falling and therefore she needs some support to get around.  REVIEW OF SYSTEMS:   Constitutional: Denies fevers, chills or abnormal weight loss Eyes: Denies blurriness of vision Ears, nose, mouth, throat, and face: Denies mucositis or sore throat Respiratory: Denies cough, dyspnea or wheezes Cardiovascular: Denies palpitation, chest discomfort Gastrointestinal:  Denies nausea, heartburn or change in bowel habits Skin: Denies abnormal skin rashes Lymphatics: Denies new lymphadenopathy or easy bruising Neurological:Denies numbness, tingling or new weaknesses Behavioral/Psych: Mood is stable, no new changes  Extremities: No lower extremity edema All other systems were reviewed with the patient and are negative.  I have reviewed the past medical history, past surgical history, social history and family history with the patient and they are unchanged from previous note.  ALLERGIES:  is allergic to penicillins; sulfa antibiotics; atorvastatin; and zetia [ezetimibe].  MEDICATIONS:  Current Outpatient Medications  Medication Sig Dispense Refill  . aspirin 81 MG tablet Take 81 mg by mouth daily.      . Calcium Carbonate-Vitamin D (CALTRATE 600+D PO) Take 1 tablet by mouth 2 (two) times daily.    . cyclobenzaprine (FLEXERIL) 5 MG tablet Take 1 tablet (5 mg total) by mouth 3 (three) times daily as needed for muscle spasms. 30 tablet 0  . lidocaine-prilocaine (EMLA) cream Apply to affected area  once 30 g 3  . LORazepam (ATIVAN) 0.5 MG tablet TAKE 1 TABLET AT BEDTIME AS NEEDED FOR NAUSEA OR VOMITING 30 tablet 3  . ondansetron (ZOFRAN) 8 MG tablet TAKE 1 TABLET TWICE A DAY AS NEEDED FOR NAUSEA AND VOMITING. START ON THE 3RD DAY AFTER CHEMOTHERAPY.  1  . oxyCODONE (OXY IR/ROXICODONE) 5 MG immediate release tablet Take 1 tablet (5 mg total)  by mouth every 4 (four) hours as needed for severe pain. 30 tablet 0  . oxyCODONE-acetaminophen (PERCOCET) 5-325 MG tablet Take 1 tablet by mouth every 4 (four) hours as needed. 60 tablet 0  . prochlorperazine (COMPAZINE) 10 MG tablet Take 1 tablet (10 mg total) by mouth every 6 (six) hours as needed (Nausea or vomiting). 90 tablet 0  . sertraline (ZOLOFT) 100 MG tablet Take 200 mg by mouth daily.      No current facility-administered medications for this visit.    Facility-Administered Medications Ordered in Other Visits  Medication Dose Route Frequency Provider Last Rate Last Dose  . heparin lock flush 100 unit/mL  500 Units Intracatheter Once PRN Nicholas Lose, MD      . sodium chloride flush (NS) 0.9 % injection 10 mL  10 mL Intracatheter PRN Nicholas Lose, MD        PHYSICAL EXAMINATION: ECOG PERFORMANCE STATUS: 3 - Symptomatic, >50% confined to bed  Vitals:   10/21/17 0942  BP: (!) 118/49  Pulse: (!) 113  Resp: 16  Temp: 97.9 F (36.6 C)  SpO2: 98%   Filed Weights   10/21/17 0942  Weight: 135 lb 8 oz (61.5 kg)    GENERAL:alert, no distress and comfortable SKIN: skin color, texture, turgor are normal, no rashes or significant lesions EYES: normal, Conjunctiva are pink and non-injected, sclera clear OROPHARYNX:no exudate, no erythema and lips, buccal mucosa, and tongue normal  NECK: supple, thyroid normal size, non-tender, without nodularity LYMPH:  no palpable lymphadenopathy in the cervical, axillary or inguinal LUNGS: clear to auscultation and percussion with normal breathing effort HEART: regular rate & rhythm and no murmurs and no lower extremity edema ABDOMEN:abdomen soft, non-tender and normal bowel sounds MUSCULOSKELETAL:no cyanosis of digits and no clubbing  NEURO: Generalized weakness, tremor, gait ataxia EXTREMITIES: No lower extremity edema  LABORATORY DATA:  I have reviewed the data as listed CMP Latest Ref Rng & Units 10/21/2017 10/14/2017 10/08/2017    Glucose 70 - 140 mg/dL 108 105 102(H)  BUN 7 - 26 mg/dL 15 12 9   Creatinine 0.60 - 1.10 mg/dL 0.84 0.82 0.79  Sodium 136 - 145 mmol/L 136 137 136  Potassium 3.3 - 4.7 mmol/L 4.2 3.4 3.4(L)  Chloride 98 - 109 mmol/L 102 103 106  CO2 22 - 29 mmol/L 24 25 22   Calcium 8.4 - 10.4 mg/dL 9.7 9.3 8.8(L)  Total Protein 6.4 - 8.3 g/dL 7.3 7.0 6.9  Total Bilirubin 0.2 - 1.2 mg/dL 0.3 <0.2(L) 0.3  Alkaline Phos 40 - 150 U/L 89 136 102  AST 5 - 34 U/L 17 15 19   ALT 0 - 55 U/L 22 23 16     Lab Results  Component Value Date   WBC 5.3 10/21/2017   HGB 11.3 (L) 10/21/2017   HCT 33.7 (L) 10/21/2017   MCV 88.0 10/21/2017   PLT 305 10/21/2017   NEUTROABS 3.8 10/21/2017    ASSESSMENT & PLAN:  Malignant neoplasm of upper-inner quadrant of right breast in female, estrogen receptor positive ([place]) 08/06/2017 right mastectomy: Scattered foci of invasive lobular cancer, grade 2,  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  Mammaprint high risk: Dose since Adriamycin and Cytoxan x4 followed by Taxol weekly x12 started 09/23/2017 2. adjuvant radiation therapy 3.  Followed by adjuvant antiestrogen therapy -------------------------------------------------------------------------------------------------------------------------------------- Current treatment: Completed 2 cycles of dose dense Adriamycin and Cytoxan, stopped for toxicities and hospitalization. Taxol cycle 1 (at the very low dose to see if she tolerates it) Echocardiogram 09/09/2017: EF 65-70%   Chemo toxicities: 1.  Severe insomnia: 2. Dehydration 3.  Low back pain: Possibly Neulasta related 4.  Severe fatigue  5.  nausea: 6.  Dehydration  I discussed with the patient that we will start Taxol at a very low dose and see if she tolerates it.  If she does not tolerated then we will increase the dosage  progressively.  Gait ataxia: Patient is very anxious about walking by herself.  I encouraged her to use a cane instead. Return to clinic in 1 week for toxicity check   I spent 25 minutes talking to the patient of which more than half was spent in counseling and coordination of care.  No orders of the defined types were placed in this encounter.  The patient has a good understanding of the overall plan. she agrees with it. she will call with any problems that may develop before the next visit here.   Harriette Ohara, MD 10/21/17

## 2017-10-21 NOTE — Telephone Encounter (Signed)
Changed appts per 1/14 los - gave patient avs and calendar with appts

## 2017-10-21 NOTE — Patient Instructions (Addendum)
[facility] Discharge Instructions for Patients Receiving Chemotherapy  Today you received the following chemotherapy agent: Taxol (paclitaxel).  To help prevent nausea and vomiting after your treatment, we encourage you to take your nausea medication as prescribed. If you develop nausea and vomiting that is not controlled by your nausea medication, call the clinic.   BELOW ARE SYMPTOMS THAT SHOULD BE REPORTED IMMEDIATELY:  *FEVER GREATER THAN 100.5 F  *CHILLS WITH OR WITHOUT FEVER  NAUSEA AND VOMITING THAT IS NOT CONTROLLED WITH YOUR NAUSEA MEDICATION  *UNUSUAL SHORTNESS OF BREATH  *UNUSUAL BRUISING OR BLEEDING  TENDERNESS IN MOUTH AND THROAT WITH OR WITHOUT PRESENCE OF ULCERS  *URINARY PROBLEMS  *BOWEL PROBLEMS  UNUSUAL RASH Items with * indicate a potential emergency and should be followed up as soon as possible.  Feel free to call the clinic should you have any questions or concerns. The clinic phone number is [phone].  Please show the [facility] at check-in to the Emergency Department and triage nurse.  Paclitaxel injection What is this medicine? PACLITAXEL (PAK li TAX el) is a chemotherapy drug. It targets fast dividing cells, like cancer cells, and causes these cells to die. This medicine is used to treat ovarian cancer, breast cancer, and other cancers. This medicine may be used for other purposes; ask your health care provider or pharmacist if you have questions. COMMON BRAND NAME(S): Onxol, Taxol What should I tell my health care provider before I take this medicine? They need to know if you have any of these conditions: -blood disorders -irregular heartbeat -infection (especially a virus infection such as chickenpox, cold sores, or herpes) -liver disease -previous or ongoing radiation therapy -an unusual or allergic reaction to paclitaxel, alcohol, polyoxyethylated castor oil, other chemotherapy agents, other medicines, foods, dyes, or  preservatives -pregnant or trying to get pregnant -breast-feeding How should I use this medicine? This drug is given as an infusion into a vein. It is administered in a hospital or clinic by a specially trained health care professional. Talk to your pediatrician regarding the use of this medicine in children. Special care may be needed. Overdosage: If you think you have taken too much of this medicine contact a poison control center or emergency room at once. NOTE: This medicine is only for you. Do not share this medicine with others. What if I miss a dose? It is important not to miss your dose. Call your doctor or health care professional if you are unable to keep an appointment. What may interact with this medicine? Do not take this medicine with any of the following medications: -disulfiram -metronidazole This medicine may also interact with the following medications: -cyclosporine -diazepam -ketoconazole -medicines to increase blood counts like filgrastim, pegfilgrastim, sargramostim -other chemotherapy drugs like cisplatin, doxorubicin, epirubicin, etoposide, teniposide, vincristine -quinidine -testosterone -vaccines -verapamil Talk to your doctor or health care professional before taking any of these medicines: -acetaminophen -aspirin -ibuprofen -ketoprofen -naproxen This list may not describe all possible interactions. Give your health care provider a list of all the medicines, herbs, non-prescription drugs, or dietary supplements you use. Also tell them if you smoke, drink alcohol, or use illegal drugs. Some items may interact with your medicine. What should I watch for while using this medicine? Your condition will be monitored carefully while you are receiving this medicine. You will need important blood work done while you are taking this medicine. This medicine can cause serious allergic reactions. To reduce your risk you will need to take other  medicine(s) before  treatment with this medicine. If you experience allergic reactions like skin rash, itching or hives, swelling of the face, lips, or tongue, tell your doctor or health care professional right away. In some cases, you may be given additional medicines to help with side effects. Follow all directions for their use. This drug may make you feel generally unwell. This is not uncommon, as chemotherapy can affect healthy cells as well as cancer cells. Report any side effects. Continue your course of treatment even though you feel ill unless your doctor tells you to stop. Call your doctor or health care professional for advice if you get a fever, chills or sore throat, or other symptoms of a cold or flu. Do not treat yourself. This drug decreases your body's ability to fight infections. Try to avoid being around people who are sick. This medicine may increase your risk to bruise or bleed. Call your doctor or health care professional if you notice any unusual bleeding. Be careful brushing and flossing your teeth or using a toothpick because you may get an infection or bleed more easily. If you have any dental work done, tell your dentist you are receiving this medicine. Avoid taking products that contain aspirin, acetaminophen, ibuprofen, naproxen, or ketoprofen unless instructed by your doctor. These medicines may hide a fever. Do not become pregnant while taking this medicine. Women should inform their doctor if they wish to become pregnant or think they might be pregnant. There is a potential for serious side effects to an unborn child. Talk to your health care professional or pharmacist for more information. Do not breast-feed an infant while taking this medicine. Men are advised not to father a child while receiving this medicine. This product may contain alcohol. Ask your pharmacist or healthcare provider if this medicine contains alcohol. Be sure to tell all healthcare providers you are taking this medicine.  Certain medicines, like metronidazole and disulfiram, can cause an unpleasant reaction when taken with alcohol. The reaction includes flushing, headache, nausea, vomiting, sweating, and increased thirst. The reaction can last from 30 minutes to several hours. What side effects may I notice from receiving this medicine? Side effects that you should report to your doctor or health care professional as soon as possible: -allergic reactions like skin rash, itching or hives, swelling of the face, lips, or tongue -low blood counts - This drug may decrease the number of white blood cells, red blood cells and platelets. You may be at increased risk for infections and bleeding. -signs of infection - fever or chills, cough, sore throat, pain or difficulty passing urine -signs of decreased platelets or bleeding - bruising, pinpoint red spots on the skin, black, tarry stools, nosebleeds -signs of decreased red blood cells - unusually weak or tired, fainting spells, lightheadedness -breathing problems -chest pain -high or low blood pressure -mouth sores -nausea and vomiting -pain, swelling, redness or irritation at the injection site -pain, tingling, numbness in the hands or feet -slow or irregular heartbeat -swelling of the ankle, feet, hands Side effects that usually do not require medical attention (report to your doctor or health care professional if they continue or are bothersome): -bone pain -complete hair loss including hair on your head, underarms, pubic hair, eyebrows, and eyelashes -changes in the color of fingernails -diarrhea -loosening of the fingernails -loss of appetite -muscle or joint pain -red flush to skin -sweating This list may not describe all possible side effects. Call your doctor for medical advice about side effects.  You may report side effects to FDA at 1-800-FDA-1088. Where should I keep my medicine? This drug is given in a hospital or clinic and will not be stored at  home. NOTE: This sheet is a summary. It may not cover all possible information. If you have questions about this medicine, talk to your doctor, pharmacist, or health care provider.  2018 Elsevier/Gold Standard (2015-07-26 19:58:00)

## 2017-10-21 NOTE — Assessment & Plan Note (Signed)
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  Mammaprint high risk: Dose since Adriamycin and Cytoxan x4 followed by Taxol weekly x12 started 09/23/2017 2. adjuvant radiation therapy 3.  Followed by adjuvant antiestrogen therapy -------------------------------------------------------------------------------------------------------------------------------------- Current treatment: Completed 2 cycles of dose dense Adriamycin and Cytoxan, stopped for toxicities and hospitalization. Taxol cycle 1 Echocardiogram 09/09/2017: EF 65-70%   Chemo toxicities: 1.  Severe insomnia: 2. Dehydration 3.  Low back pain: Possibly Neulasta related 4.  Severe fatigue  5.  nausea: 6.  Dehydration  Return to clinic in 1 week for toxicity check

## 2017-10-22 ENCOUNTER — Telehealth: Payer: Self-pay

## 2017-10-22 NOTE — Telephone Encounter (Signed)
Called pt to follow up after her first taxol treatment yesterday. Patient stated she felt very weak this morning. I explained to her that some weakness is to be expected. She had some nausea but relieved by antiemetics. Educated patient to drink plenty of fluids today and rest and to call us if symptoms do not improve or get worse.  Cyndia Bent RN

## 2017-10-22 NOTE — Telephone Encounter (Signed)
-----   Message from Ma Hillock, RN sent at 10/21/2017  1:49 PM EST ----- Regarding: [name]: 1st Taxol F/U Patient received first dose of taxol today and tolerated the treatment without any complications. Please call to follow-up.

## 2017-10-22 NOTE — Progress Notes (Signed)
[facility] Spiritual Care Note  After many attempts to connect by phone, Gloria Ware and I were able to meet in person during her infusion. She shared about the ways that she's identifying, coping with, and learning from anxiety that is stirred up particularly by the unknowns in treatment and fear of recurrence following tx.  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 To this end, she also plans to explore [employer] healing arts offerings.  Similarly, reading is too taxing right now, so Gloria Ware plans to try audiobooks as a means to occupy her mind.  Per pt, she is also learning that some worries and low energy are just part of the process of dealing with dx/tx; this realization is helping her observe her feelings and move on, rather than spiraling into rumination that escalates her anxiety.  Gloria Ware is aware of ongoing Spiritual Care availability for emotional and spiritual support related meaning-making, purpose, coping, reframing, and other needs we have discussed, but please also page if immediate needs arise. Thank you.   [place], [place], [facility] Pager [phone] Voicemail [phone]

## 2017-10-23 ENCOUNTER — Ambulatory Visit: Payer: Medicare Other

## 2017-10-23 DIAGNOSIS — R262 Difficulty in walking, not elsewhere classified: Secondary | ICD-10-CM

## 2017-10-23 DIAGNOSIS — M25611 Stiffness of right shoulder, not elsewhere classified: Secondary | ICD-10-CM | POA: Diagnosis not present

## 2017-10-23 DIAGNOSIS — R293 Abnormal posture: Secondary | ICD-10-CM

## 2017-10-23 DIAGNOSIS — M6281 Muscle weakness (generalized): Secondary | ICD-10-CM | POA: Diagnosis not present

## 2017-10-23 NOTE — Patient Instructions (Signed)
[employer]    Straighten leg and try to hold it __5__ seconds.  Repeat __5-10__ times. Do __2-3__ sessions a day.   Hip: Adduction / Abduction   In sitting but can also do this laying down. Place ball or pillow between knees (knees should be aligned with hips).   Hold each position _5__ seconds. Repeat sequence _10-20__ times. Lie on left side. Do _2-3__ sessions per day.  Seated Alternating Leg Raise (Marching)    Sit on chair. Raise bent knee and return. Repeat with other leg. Do _2__ sets of _5-10__ repetitions.  Hip Extension (Standing)    Stand with support. Move right leg backward with straight knee. Hold for _3__ seconds.  Repeat _5-10__ times. Do _2-3__ times a day. Repeat with other leg.   Can also do side and front kicks with each leg at the counter.    Cancer Rehab [phone]

## 2017-10-23 NOTE — Therapy (Signed)
[facility] [place], [place], [address] Phone: [phone]   Fax:  [phone]  Physical Therapy Treatment  Patient Details  Name: Gloria Ware MRN: [id number] Date of Birth: 10-13-1945 Referring Provider: Dr. [name]   Encounter Date: 10/23/2017  PT End of Session - 10/23/17 1421    Visit Number  6    Number of Visits  21    Date for PT Re-Evaluation  12/03/17    PT Start Time  2952    PT Stop Time  1415 pt unable to tolerate full session due to fatigue from chemo    PT Time Calculation (min)  30 min    Activity Tolerance  Patient limited by fatigue    Behavior During Therapy  [facility] for tasks assessed/performed       Past Medical History:  Diagnosis Date  . Anxiety   . Breast CA ([place])   . Colon polyp 09/12/2005  . Depression   . Eczema    right forearm   . Family history of breast cancer   . Genetic testing 06/27/2017   Multi-Cancer panel (83 genes) @ [facility] - No pathogenic mutations detected  . Hearing loss   . High cholesterol   . Injury     left foot metatarsal fracture sustined 08-03-17 , treated with boot; patient  reports  improvement as of today  . Osteopenia 11/2016   T score -2.1 stable from prior DEXA  . PONV (postoperative nausea and vomiting)    post op nausea  . Tinnitus     Past Surgical History:  Procedure Laterality Date  . CARDIAC CATHETERIZATION     husband was having symtpoms and needed a stent; subsequently she states she started " to feel something"  but thinks it was " all in my head bc of my hbsand". subsequently saw same cardiologist as her husband who sent for treadmill stress that had inconcluisicve results so she was sent for cardiac catherization which she states was " definitely negative "   . cataract surg    . DILATION AND CURETTAGE OF UTERUS    . IR CV LINE INJECTION  09/30/2017  . MASTECTOMY W/ SENTINEL NODE BIOPSY Right 08/06/2017   Procedure: RIGHT  MASTECTOMY WITH SENTINEL LYMPH NODE BIOPSY;  Surgeon: [name], MD;  Location: [facility];  Service: General;  Laterality: Right;  . MOUTH SURGERY    . PORTACATH PLACEMENT N/A 09/12/2017   Procedure: INSERTION PORT-A-CATH;  Surgeon: [name], MD;  Location: [facility];  Service: General;  Laterality: N/A;  . RADIOACTIVE SEED GUIDED AXILLARY SENTINEL LYMPH NODE Right 08/06/2017   Procedure: SEED TARGETED AXILLARY LYMPH NODE EXCISION;  Surgeon: [name], MD;  Location: [facility];  Service: General;  Laterality: Right;    There were no vitals filed for this visit.  Subjective Assessment - 10/23/17 1351    Subjective  I am feeling better today than last week as far as pain goes, but I am just so tired and weak. I think all my pain last week was from the chemo I was taking which they stopped. My WBC had dropped but it's come back up now. They started me on a new chemo Monday (Taxol) and they only gave me half a dose. So far I can already tell I am handling it better.  My back was hurting this morning some but I took 2 extra stength Tylenol and I have no pain now.  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 It measured 1 cm and is located in the upper inner quadrant. 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 She underwent a right mastectomy and sentinel node biopsy on 08/06/17. She is wearing a boot on her left foot due to a fall and foot fracture 08/04/17. Her orthopedist for her foot is Dr. Doran Durand.     Patient Stated Goals  Make sure my arm is ok and that I'm strong enough for chemotherapy.    Currently in Pain?  No/denies                      [facility] Adult PT Treatment/Exercise - 10/23/17 0001      Knee/Hip Exercises: Standing   Hip Extension  Stengthening;Right;Left;1 set;5 reps     Extension Limitations  Pt reported starting to feel faint/fatigued and needed to stop.      Knee/Hip Exercises: Seated   Long Arc Quad  Strengthening;Right;Left;2 sets;5 reps 5 sec holds    Ball Squeeze  2 x 10, with 5 sec holds using bolster.    Marching  Strengthening;Right;Left;2 sets;5 reps             PT Education - 10/23/17 1419    Education provided  Yes    Education Details  Seated bil LE strength and standing hip extension; also educated pt and husband on how she could start walking around house as much as able and keep a log so she knows how often and long she is walking.     Person(s) Educated  Patient    Methods  Explanation;Demonstration;Handout    Comprehension  Verbalized understanding;Returned demonstration          [facility] Goals - 09/02/17 1504      CC Long Term Goal  #1   Title  Patient will perform home exercise program correctly and independently.    Time  8    Period  Weeks    Status  New      CC Long Term Goal  #2   Title  Patient will demonstrate right shoulder active flexion at >/= 130 degrees for increased ease reaching overhead.    Baseline  111 degrees today    Time  8    Period  Weeks    Status  New      CC Long Term Goal  #3   Title  Patient will demonstrate right shoulder active abduction at >/= 137 degrees for increased ease reaching overhead.    Baseline  125 degrees today    Time  8    Period  Weeks    Status  New      CC Long Term Goal  #4   Title  Patient will report she is able to participate in a regular walking program for 30 minutes, 5-6 days per week for reducing recurrence risk and better tolerance of chemotherapy.    Baseline  Unable to walk long distance with boot on.    Time  8    Period  Weeks    Status  New      CC Long Term Goal  #5   Title  Patient will verbalize understanding of risk reduction practices related to lymphedema.    Time  8    Period  Weeks  Status  New      CC Long Term Goal  #6    Title  Quick DASH score reduced to </= 4 for improved function of right arm.    Time  8    Period  Weeks    Status  New      Additional Goals   Additional Goals  Yes         Plan - 10/23/17 1421    Clinical Impression Statement  Pts back pain was 0/10 today though she still presents with tremors of her bil LE's. She was able to perform some exercises today but with very limited tolerance/frequent rest breaks and requested we end therapy early due to fatigue. Added exercises she was able to tolerate to HEP today and instructed her and husband in how these exercises could be tailored to how she was feeling each day (could be done in bed if feeling weaker, or in sitting/standing when feeling stronger). Both verbalized understanding.  Also encouraged pt to incorporate exercises into day as often as able, even if only for short reps, but make sure to isten to her body and rest prn as well.     Rehab Potential  Excellent    Clinical Impairments Affecting Rehab Potential  Recently healed Lt foot fracture; started chemo 09/16/17 but has now switched to Taxol due to poor tolerance/dec WBC=pt very weak    PT Frequency  2x / week    PT Duration  8 weeks    PT Treatment/Interventions  Therapeutic exercise;Patient/family education;Balance training;Neuromuscular re-education;Gait training;ADLs/Self Care Home Management;Therapeutic activities;Functional mobility training;Manual techniques;Passive range of motion;Scar mobilization    PT Next Visit Plan  Cont Nustep when pt feels able and review HEP prn; cont Rt shoulder ROM and LE strength; rest breaks as needed between appts.    PT Home Exercise Plan  Seated LE strength for now    Consulted and Agree with Plan of Care  Patient       Patient will benefit from skilled therapeutic intervention in order to improve the following deficits and impairments:  Postural dysfunction, Decreased knowledge of precautions, Pain, Impaired UE functional use, Decreased range  of motion, Difficulty walking, Decreased activity tolerance, Decreased strength  Visit Diagnosis: Muscle weakness (generalized)  Abnormal posture  Difficulty in walking, not elsewhere classified     Problem List Patient Active Problem List   Diagnosis Date Noted  . Insomnia 10/07/2017  . Dehydration 10/07/2017  . Port-A-Cath in place 09/30/2017  . Breast cancer metastasized to multiple sites, right ([place]) 08/06/2017  . Genetic testing 06/27/2017  . Family history of breast cancer   . Breast CA ([place])   . Malignant neoplasm of upper-inner quadrant of right breast in female, estrogen receptor positive ([place]) 06/26/2017    Otelia Limes, PTA 10/23/2017, 2:27 PM  [facility] [place], [place], [address] Phone: [phone]   Fax:  [phone]  Name: Gloria Ware MRN: [id number] Date of Birth: [birth year]

## 2017-10-24 ENCOUNTER — Encounter: Payer: Self-pay | Admitting: Pharmacist

## 2017-10-25 ENCOUNTER — Telehealth: Payer: Self-pay

## 2017-10-25 ENCOUNTER — Other Ambulatory Visit: Payer: Self-pay

## 2017-10-25 ENCOUNTER — Inpatient Hospital Stay (HOSPITAL_BASED_OUTPATIENT_CLINIC_OR_DEPARTMENT_OTHER): Payer: Medicare Other | Admitting: Hematology and Oncology

## 2017-10-25 DIAGNOSIS — R110 Nausea: Secondary | ICD-10-CM | POA: Diagnosis not present

## 2017-10-25 DIAGNOSIS — F329 Major depressive disorder, single episode, unspecified: Secondary | ICD-10-CM

## 2017-10-25 DIAGNOSIS — Z7982 Long term (current) use of aspirin: Secondary | ICD-10-CM | POA: Diagnosis not present

## 2017-10-25 DIAGNOSIS — C50211 Malignant neoplasm of upper-inner quadrant of right female breast: Secondary | ICD-10-CM

## 2017-10-25 DIAGNOSIS — R531 Weakness: Secondary | ICD-10-CM

## 2017-10-25 DIAGNOSIS — M545 Low back pain: Secondary | ICD-10-CM

## 2017-10-25 DIAGNOSIS — Z170 Estrogen receptor positive status [ER+]: Secondary | ICD-10-CM | POA: Diagnosis not present

## 2017-10-25 DIAGNOSIS — Z9011 Acquired absence of right breast and nipple: Secondary | ICD-10-CM

## 2017-10-25 DIAGNOSIS — R251 Tremor, unspecified: Secondary | ICD-10-CM | POA: Diagnosis not present

## 2017-10-25 DIAGNOSIS — R000 Tachycardia, unspecified: Secondary | ICD-10-CM

## 2017-10-25 DIAGNOSIS — E860 Dehydration: Secondary | ICD-10-CM

## 2017-10-25 DIAGNOSIS — G4700 Insomnia, unspecified: Secondary | ICD-10-CM | POA: Diagnosis not present

## 2017-10-25 DIAGNOSIS — R5383 Other fatigue: Secondary | ICD-10-CM

## 2017-10-25 DIAGNOSIS — Z5111 Encounter for antineoplastic chemotherapy: Secondary | ICD-10-CM | POA: Diagnosis not present

## 2017-10-25 DIAGNOSIS — Z79899 Other long term (current) drug therapy: Secondary | ICD-10-CM

## 2017-10-25 NOTE — Progress Notes (Signed)
Patient Care Team: [name], MD as PCP - General (Family Medicine) [name], MD as Consulting Physician (General Surgery) Nicholas Lose, MD as Consulting Physician (Hematology and Oncology) Kyung Rudd, MD as Consulting Physician (Radiation Oncology)  DIAGNOSIS:  Encounter Diagnosis  Name Primary?  . Malignant neoplasm of upper-inner quadrant of right breast in female, estrogen receptor positive ([place])     SUMMARY OF ONCOLOGIC HISTORY:   Malignant neoplasm of upper-inner quadrant of right breast in female, estrogen receptor positive ([place])   06/21/2017 Initial Diagnosis    Screening detected right breast mass and calcifications 1 cm at 1:00 position biopsy of mass and calcifications revealed invasive lobular cancer with pleomorphic features, grade 2, axillary lymph node biopsy positive for cancer, ER/PR positive HER-2 negative with a Ki-67 of 40%, T1b N1 stage IB AJCC 8      08/06/2017 Surgery    Right mastectomy: Scattered foci of invasive lobular cancer, grade 2, 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, LCIS, margins negative, 2/6 lymph nodes +1 with isolated tumor cells, T1BN1 stage I the AJCC 8 ER 95%, PR 30%, HER-2 negative, Ki-67 40%      08/12/2017 Miscellaneous    Mammaprint high risk luminal type B      09/17/2017 -  Chemotherapy    Adjuvant chemo with dose dense Adriamycin and Cytoxan X 2 (Stopped due to toxicities) followed by Taxol weekly x12      10/07/2017 - 10/09/2017 Hospital Admission    Adm for failure to thrive, insomnia and back pain       CHIEF COMPLIANT: Complains of severe insomnia, worsening low back pain, increasing essential tremor, severe fatigue  INTERVAL HISTORY: Gloria Ware is a 72 year old with above-mentioned history of right breast cancer treated with mastectomy and was found to be high risk on Mammaprint and was receiving systemic chemotherapy.  She took 2 doses of Adriamycin and Cytoxan and 1 dose of Taxol.  After the last dose of Taxol she has not  felt well.  She received only 50% of the dosage of Taxol but still she felt extremely weak and tired and has had worsening low back pain as well as profound fatigue and increasing essential tremor.  REVIEW OF SYSTEMS:   Constitutional: Denies fevers, chills or abnormal weight loss Eyes: Denies blurriness of vision Ears, nose, mouth, throat, and face: Denies mucositis or sore throat Respiratory: Denies cough, dyspnea or wheezes Cardiovascular: Denies palpitation, chest discomfort Gastrointestinal:  Denies nausea, heartburn or change in bowel habits Skin: Denies abnormal skin rashes Lymphatics: Denies new lymphadenopathy or easy bruising Neurological: Severe generalized weakness, essential tremor, low back pain Behavioral/Psych: Anxiety, depression Extremities: No lower extremity edema All other systems were reviewed with the patient and are negative.  I have reviewed the past medical history, past surgical history, social history and family history with the patient and they are unchanged from previous note.  ALLERGIES:  is allergic to penicillins; sulfa antibiotics; atorvastatin; and zetia [ezetimibe].  MEDICATIONS:  Current Outpatient Medications  Medication Sig Dispense Refill  . aspirin 81 MG tablet Take 81 mg by mouth daily.      . Calcium Carbonate-Vitamin D (CALTRATE 600+D PO) Take 1 tablet by mouth 2 (two) times daily.    . cyclobenzaprine (FLEXERIL) 5 MG tablet Take 1 tablet (5 mg total) by mouth 3 (three) times daily as needed for muscle spasms. 30 tablet 0  . LORazepam (ATIVAN) 0.5 MG tablet TAKE 1 TABLET AT BEDTIME AS NEEDED FOR NAUSEA OR VOMITING 30 tablet 3  .  ondansetron (ZOFRAN) 8 MG tablet TAKE 1 TABLET TWICE A DAY AS NEEDED FOR NAUSEA AND VOMITING. START ON THE 3RD DAY AFTER CHEMOTHERAPY.  1  . oxyCODONE (OXY IR/ROXICODONE) 5 MG immediate release tablet Take 1 tablet (5 mg total) by mouth every 4 (four) hours as needed for severe pain. 30 tablet 0  . sertraline (ZOLOFT)  100 MG tablet Take 200 mg by mouth daily.      No current facility-administered medications for this visit.     PHYSICAL EXAMINATION: ECOG PERFORMANCE STATUS: 1 - Symptomatic but completely ambulatory  Vitals:   10/25/17 0919  BP: 123/60  Pulse: (!) 118  Resp: 17  Temp: 97.6 F (36.4 C)  SpO2: 99%   Filed Weights   10/25/17 0919  Weight: 136 lb 4.8 oz (61.8 kg)    GENERAL:alert, no distress and comfortable SKIN: skin color, texture, turgor are normal, no rashes or significant lesions EYES: normal, Conjunctiva are pink and non-injected, sclera clear OROPHARYNX:no exudate, no erythema and lips, buccal mucosa, and tongue normal  NECK: supple, thyroid normal size, non-tender, without nodularity LYMPH:  no palpable lymphadenopathy in the cervical, axillary or inguinal LUNGS: clear to auscultation and percussion with normal breathing effort HEART: regular rate & rhythm and no murmurs and no lower extremity edema ABDOMEN:abdomen soft, non-tender and normal bowel sounds MUSCULOSKELETAL:no cyanosis of digits and no clubbing  NEURO: alert & oriented x 3 with fluent speech, no focal motor/sensory deficits EXTREMITIES: No lower extremity edema  LABORATORY DATA:  I have reviewed the data as listed CMP Latest Ref Rng & Units 10/21/2017 10/14/2017 10/08/2017  Glucose 70 - 140 mg/dL 108 105 102(H)  BUN 7 - 26 mg/dL _0 Creatinine 0.60 - 1.10 mg/dL 0.84 0.82 0.79  Sodium 136 - 145 mmol/L 136 137 136  Potassium 3.3 - 4.7 mmol/L 4.2 3.4 3.4(L)  Chloride 98 - 109 mmol/L 102 103 106  CO2 22 - 29 mmol/L _1 Calcium 8.4 - 10.4 mg/dL 9.7 9.3 8.8(L)  Total Protein 6.4 - 8.3 g/dL 7.3 7.0 6.9  Total Bilirubin 0.2 - 1.2 mg/dL 0.3 <0.2(L) 0.3  Alkaline Phos 40 - 150 U/L 89 136 102  AST 5 - 34 U/L _2 ALT 0 - 55 U/L _3 Lab Results  Component Value Date   WBC 5.3 10/21/2017   HGB 11.3 (L) 10/21/2017   HCT 33.7 (L) 10/21/2017   MCV 88.0 10/21/2017   PLT 305 10/21/2017     NEUTROABS 3.8 10/21/2017    ASSESSMENT & PLAN:  Malignant neoplasm of upper-inner quadrant of right breast in female, estrogen receptor positive ([place]) 08/06/2017 right mastectomy: Scattered foci of invasive lobular cancer, grade 2, 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 Mammaprint high risk: Dose since Adriamycin and Cytoxan x4 followed by Taxol weekly x12 started 09/23/2017 2. adjuvant radiation therapy 3. Followed by adjuvant antiestrogen therapy -------------------------------------------------------------------------------------------------------------------------------------- Current treatment: Completed 2 cycles ofdose dense Adriamycin and Cytoxan, stopped for toxicities and hospitalization. Taxol cycle 1 given 10/21/2017 (at the very low dose to see if she tolerates it)  Urgent visit today for 1.  Recurrent severe insomnia: In spite of multiple sedative medications.  2. low back pain with gait ataxia: I instructed her to take around-the-clock muscle relaxants every 8 hours along with Tylenol. 3.  Major depression 4.  Severe fatigue   Because the patient is unable to tolerate even such a low-dose of Paxil, I recommended that we discontinue further chemotherapy. She will come back on Monday for IV fluids. I instructed to increase her oral fluid intake.  Patient has tachycardia but tells me that she drinks 7 glasses of water every day. We will plan on administering IV fluids next Monday.  I spent 25 minutes talking to the patient of which more than half was spent in counseling and coordination of care.  No orders of the defined types were placed in this encounter.  The patient has a good understanding of the overall plan. she agrees with it. she will call with any problems that may develop before the next visit here.   Harriette Ohara, MD 10/25/17

## 2017-10-25 NOTE — Telephone Encounter (Signed)
Returned pt call. After 1st treatment taxol on 01/15 pt is unable to sleep, weak, and having a lot of lower back pain. She is coming in this am to see Dr. Lindi Adie.  Cyndia Bent RN

## 2017-10-25 NOTE — Assessment & Plan Note (Signed)
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 Mammaprint high risk: Dose since Adriamycin and Cytoxan x4 followed by Taxol weekly x12 started 09/23/2017 2. adjuvant radiation therapy 3. Followed by adjuvant antiestrogen therapy -------------------------------------------------------------------------------------------------------------------------------------- Current treatment: Completed 2 cycles ofdose dense Adriamycin and Cytoxan, stopped for toxicities and hospitalization. Taxol cycle 1 given 10/21/2017 (at the very low dose to see if she tolerates it)  Urgent visit today for 1.  Recurrent severe insomnia: In spite of multiple sedative medications.  2. low back pain with gait ataxia: We will obtain an MRI of the lower back to evaluate the cause of her recurrent pain. 3.  Major depression 4.  Severe fatigue Return to clinic weekly for follow-ups

## 2017-10-28 ENCOUNTER — Other Ambulatory Visit: Payer: Self-pay

## 2017-10-28 ENCOUNTER — Inpatient Hospital Stay: Payer: Medicare Other

## 2017-10-28 ENCOUNTER — Inpatient Hospital Stay (HOSPITAL_BASED_OUTPATIENT_CLINIC_OR_DEPARTMENT_OTHER): Payer: Medicare Other | Admitting: Hematology and Oncology

## 2017-10-28 ENCOUNTER — Encounter: Payer: Medicare Other | Admitting: Nutrition

## 2017-10-28 ENCOUNTER — Other Ambulatory Visit: Payer: Self-pay | Admitting: *Deleted

## 2017-10-28 ENCOUNTER — Inpatient Hospital Stay: Payer: Medicare Other | Admitting: Nutrition

## 2017-10-28 ENCOUNTER — Encounter: Payer: Self-pay | Admitting: *Deleted

## 2017-10-28 ENCOUNTER — Telehealth: Payer: Self-pay | Admitting: Hematology and Oncology

## 2017-10-28 VITALS — BP 116/57 | HR 107 | Temp 97.8°F | Resp 20 | Ht 61.0 in | Wt 136.8 lb

## 2017-10-28 DIAGNOSIS — R531 Weakness: Secondary | ICD-10-CM

## 2017-10-28 DIAGNOSIS — Z95828 Presence of other vascular implants and grafts: Secondary | ICD-10-CM

## 2017-10-28 DIAGNOSIS — E860 Dehydration: Secondary | ICD-10-CM

## 2017-10-28 DIAGNOSIS — R251 Tremor, unspecified: Secondary | ICD-10-CM

## 2017-10-28 DIAGNOSIS — C50211 Malignant neoplasm of upper-inner quadrant of right female breast: Secondary | ICD-10-CM | POA: Diagnosis not present

## 2017-10-28 DIAGNOSIS — Z170 Estrogen receptor positive status [ER+]: Principal | ICD-10-CM

## 2017-10-28 DIAGNOSIS — F329 Major depressive disorder, single episode, unspecified: Secondary | ICD-10-CM

## 2017-10-28 DIAGNOSIS — G4700 Insomnia, unspecified: Secondary | ICD-10-CM

## 2017-10-28 DIAGNOSIS — Z9011 Acquired absence of right breast and nipple: Secondary | ICD-10-CM

## 2017-10-28 DIAGNOSIS — F5101 Primary insomnia: Secondary | ICD-10-CM

## 2017-10-28 DIAGNOSIS — Z7982 Long term (current) use of aspirin: Secondary | ICD-10-CM

## 2017-10-28 DIAGNOSIS — R000 Tachycardia, unspecified: Secondary | ICD-10-CM | POA: Diagnosis not present

## 2017-10-28 DIAGNOSIS — R5383 Other fatigue: Secondary | ICD-10-CM | POA: Diagnosis not present

## 2017-10-28 DIAGNOSIS — M545 Low back pain: Secondary | ICD-10-CM

## 2017-10-28 DIAGNOSIS — Z79899 Other long term (current) drug therapy: Secondary | ICD-10-CM

## 2017-10-28 DIAGNOSIS — R110 Nausea: Secondary | ICD-10-CM | POA: Diagnosis not present

## 2017-10-28 DIAGNOSIS — C50911 Malignant neoplasm of unspecified site of right female breast: Secondary | ICD-10-CM

## 2017-10-28 DIAGNOSIS — Z5111 Encounter for antineoplastic chemotherapy: Secondary | ICD-10-CM | POA: Diagnosis not present

## 2017-10-28 LAB — CBC WITH DIFFERENTIAL (CANCER CENTER ONLY)
Basophils Absolute: 0.1 10*3/uL (ref 0.0–0.1)
Basophils Relative: 2 %
EOS ABS: 0.1 10*3/uL (ref 0.0–0.5)
EOS PCT: 2 %
HCT: 28.2 % — ABNORMAL LOW (ref 34.8–46.6)
HEMOGLOBIN: 9.5 g/dL — AB (ref 11.6–15.9)
LYMPHS ABS: 0.8 10*3/uL — AB (ref 0.9–3.3)
LYMPHS PCT: 20 %
MCH: 29.1 pg (ref 25.1–34.0)
MCHC: 33.7 g/dL (ref 31.5–36.0)
MCV: 86.2 fL (ref 79.5–101.0)
MONOS PCT: 15 %
Monocytes Absolute: 0.6 10*3/uL (ref 0.1–0.9)
NEUTROS PCT: 61 %
Neutro Abs: 2.5 10*3/uL (ref 1.5–6.5)
Platelet Count: 272 10*3/uL (ref 145–400)
RBC: 3.27 MIL/uL — ABNORMAL LOW (ref 3.70–5.45)
RDW: 14.8 % (ref 11.2–16.1)
WBC Count: 4 10*3/uL (ref 3.9–10.3)

## 2017-10-28 LAB — CMP (CANCER CENTER ONLY)
ALK PHOS: 75 U/L (ref 40–150)
ALT: 24 U/L (ref 0–55)
ANION GAP: 10 (ref 3–11)
AST: 26 U/L (ref 5–34)
Albumin: 3.9 g/dL (ref 3.5–5.0)
BUN: 19 mg/dL (ref 7–26)
CALCIUM: 9.2 mg/dL (ref 8.4–10.4)
CO2: 23 mmol/L (ref 22–29)
CREATININE: 0.93 mg/dL (ref 0.60–1.10)
Chloride: 102 mmol/L (ref 98–109)
GFR, EST NON AFRICAN AMERICAN: 60 mL/min — AB (ref >60)
GFR, Est AFR Am: >60 mL/min (ref >60)
Glucose, Bld: 100 mg/dL (ref 70–140)
Potassium: 3.8 mmol/L (ref 3.3–4.7)
SODIUM: 135 mmol/L — AB (ref 136–145)
TOTAL PROTEIN: 6.8 g/dL (ref 6.4–8.3)
Total Bilirubin: 0.3 mg/dL (ref 0.2–1.2)

## 2017-10-28 LAB — SAMPLE TO BLOOD BANK

## 2017-10-28 MED ORDER — HEPARIN SOD (PORK) LOCK FLUSH 100 UNIT/ML IV SOLN
500.0000 [IU] | Freq: Once | INTRAVENOUS | Status: AC | PRN
  Administered 2017-10-28: 500 [IU] via INTRAVENOUS
  Filled 2017-10-28: qty 5

## 2017-10-28 MED ORDER — SODIUM CHLORIDE 0.9 % IV SOLN
Freq: Once | INTRAVENOUS | Status: AC
  Administered 2017-10-28: 15:00:00 via INTRAVENOUS

## 2017-10-28 MED ORDER — SODIUM CHLORIDE 0.9% FLUSH
10.0000 mL | INTRAVENOUS | Status: DC | PRN
  Administered 2017-10-28: 10 mL via INTRAVENOUS
  Filled 2017-10-28: qty 10

## 2017-10-28 MED ORDER — SODIUM CHLORIDE 0.9 % IV SOLN: Freq: Once | INTRAVENOUS | Status: DC

## 2017-10-28 NOTE — Assessment & Plan Note (Signed)
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 Mammaprint high risk: Dose since Adriamycin and Cytoxan x4 followed by Taxol weekly x12 started 09/23/2017 2. adjuvant radiation therapy 3. Followed by adjuvant antiestrogen therapy -------------------------------------------------------------------------------------------------------------------------------------- Current treatment: Completed 2 cycles ofdose dense Adriamycin and Cytoxan, stopped for toxicities and hospitalization. Discontinued Taxol after 1 cycle given 10/21/2017  Continuing problems: 1.  Insomnia 2. low back pain We will administer IV fluids to help with fluid intake deficiencies. I will arrange for radiation oncology consultation with the patient.

## 2017-10-28 NOTE — Progress Notes (Signed)
Patient Care Team: [name], MD as PCP - General (Family Medicine) [name], MD as Consulting Physician (General Surgery) Nicholas Lose, MD as Consulting Physician (Hematology and Oncology) Kyung Rudd, MD as Consulting Physician (Radiation Oncology)  DIAGNOSIS:  Encounter Diagnoses  Name Primary?  . Malignant neoplasm of upper-inner quadrant of right breast in female, estrogen receptor positive ([place]) Yes  . 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 Primary insomnia   . Dehydration     SUMMARY OF ONCOLOGIC HISTORY:   Malignant neoplasm of upper-inner quadrant of right breast in female, estrogen receptor positive ([place])   06/21/2017 Initial Diagnosis    Screening detected right breast mass and calcifications 1 cm at 1:00 position biopsy of mass and calcifications revealed invasive lobular cancer with pleomorphic features, grade 2, axillary lymph node biopsy positive for cancer, ER/PR positive HER-2 negative with a Ki-67 of 40%, T1b N1 stage IB AJCC 8      08/06/2017 Surgery    Right mastectomy: Scattered foci of invasive lobular cancer, grade 2, 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, LCIS, margins negative, 2/6 lymph nodes +1 with isolated tumor cells, T1BN1 stage I the AJCC 8 ER 95%, PR 30%, HER-2 negative, Ki-67 40%      08/12/2017 Miscellaneous    Mammaprint high risk luminal type B      09/17/2017 -  Chemotherapy    Adjuvant chemo with dose dense Adriamycin and Cytoxan X 2 (Stopped due to toxicities) followed by Taxol weekly x12      10/07/2017 - 10/09/2017 Hospital Admission    Adm for failure to thrive, insomnia and back pain       CHIEF COMPLIANT: Follow-up from last week's dehydration  INTERVAL HISTORY: Gloria Ware is a 72 year old with above-mentioned history of right breast cancer who could not tolerate adjuvant chemotherapy and hence chemo was discontinued.  She is here to discuss how he is feeling better overall her tremors have continued to be a  problem.  She is also fairly weak and is starting physical therapy.  She is unsure about her gait and the physical activity.  REVIEW OF SYSTEMS:   Constitutional: Denies fevers, chills or abnormal weight loss Eyes: Denies blurriness of vision Ears, nose, mouth, throat, and face: Denies mucositis or sore throat Respiratory: Denies cough, dyspnea or wheezes Cardiovascular: Denies palpitation, chest discomfort Gastrointestinal:  Denies nausea, heartburn or change in bowel habits Skin: Denies abnormal skin rashes Lymphatics: Denies new lymphadenopathy or easy bruising Neurological:Denies numbness, tingling or new weaknesses Behavioral/Psych: Mood is stable, no new changes  Extremities: No lower extremity edema  All other systems were reviewed with the patient and are negative.  I have reviewed the past medical history, past surgical history, social history and family history with the patient and they are unchanged from previous note.  ALLERGIES:  is allergic to penicillins; sulfa antibiotics; atorvastatin; and zetia [ezetimibe].  MEDICATIONS:  Current Outpatient Medications  Medication Sig Dispense Refill  . aspirin 81 MG tablet Take 81 mg by mouth daily.      . Calcium Carbonate-Vitamin D (CALTRATE 600+D PO) Take 1 tablet by mouth 2 (two) times daily.    . cyclobenzaprine (FLEXERIL) 5 MG tablet Take 1 tablet (5 mg total) by mouth 3 (three) times daily as needed for muscle spasms. 30 tablet 0  . LORazepam (ATIVAN) 0.5 MG tablet TAKE 1 TABLET AT BEDTIME AS NEEDED FOR NAUSEA OR VOMITING 30 tablet 3  . ondansetron (ZOFRAN) 8 MG tablet TAKE 1 TABLET  TWICE A DAY AS NEEDED FOR NAUSEA AND VOMITING. START ON THE 3RD DAY AFTER CHEMOTHERAPY.  1  . oxyCODONE (OXY IR/ROXICODONE) 5 MG immediate release tablet Take 1 tablet (5 mg total) by mouth every 4 (four) hours as needed for severe pain. 30 tablet 0  . sertraline (ZOLOFT) 100 MG tablet Take 200 mg by mouth daily.      No current  facility-administered medications for this visit.     PHYSICAL EXAMINATION: ECOG PERFORMANCE STATUS: 2 - Symptomatic, <50% confined to bed  Vitals:   10/28/17 1349  BP: (!) 116/57  Pulse: (!) 107  Resp: 20  Temp: 97.8 F (36.6 C)  SpO2: 97%   Filed Weights   10/28/17 1349  Weight: 136 lb 12.8 oz (62.1 kg)    GENERAL:alert, no distress and comfortable SKIN: skin color, texture, turgor are normal, no rashes or significant lesions EYES: normal, Conjunctiva are pink and non-injected, sclera clear OROPHARYNX:no exudate, no erythema and lips, buccal mucosa, and tongue normal  NECK: supple, thyroid normal size, non-tender, without nodularity LYMPH:  no palpable lymphadenopathy in the cervical, axillary or inguinal LUNGS: clear to auscultation and percussion with normal breathing effort HEART: regular rate & rhythm and no murmurs and no lower extremity edema ABDOMEN:abdomen soft, non-tender and normal bowel sounds MUSCULOSKELETAL:no cyanosis of digits and no clubbing  NEURO: alert & oriented x 3 with fluent speech, no focal motor/sensory deficits EXTREMITIES: No lower extremity edema  LABORATORY DATA:  I have reviewed the data as listed CMP Latest Ref Rng & Units 10/28/2017 10/21/2017 10/14/2017  Glucose 70 - 140 mg/dL 100 108 105  BUN 7 - 26 mg/dL 19 15 12   Creatinine 0.60 - 1.10 mg/dL - 0.84 0.82  Sodium 136 - 145 mmol/L 135(L) 136 137  Potassium 3.3 - 4.7 mmol/L 3.8 4.2 3.4  Chloride 98 - 109 mmol/L 102 102 103  CO2 22 - 29 mmol/L 23 24 25   Calcium 8.4 - 10.4 mg/dL 9.2 9.7 9.3  Total Protein 6.4 - 8.3 g/dL 6.8 7.3 7.0  Total Bilirubin 0.2 - 1.2 mg/dL 0.3 0.3 <0.2(L)  Alkaline Phos 40 - 150 U/L 75 89 136  AST 5 - 34 U/L 26 17 15   ALT 0 - 55 U/L 24 22 23     Lab Results  Component Value Date   WBC 4.0 10/28/2017   HGB 11.3 (L) 10/21/2017   HCT 28.2 (L) 10/28/2017   MCV 86.2 10/28/2017   PLT 272 10/28/2017   NEUTROABS 2.5 10/28/2017    ASSESSMENT & PLAN:  Malignant  neoplasm of upper-inner quadrant of right breast in female, estrogen receptor positive ([place]) 08/06/2017 right mastectomy: Scattered foci of invasive lobular cancer, grade 2, 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 Mammaprint high risk: Dose since Adriamycin and Cytoxan x4 followed by Taxol weekly x12 started 09/23/2017 2. adjuvant radiation therapy 3. Followed by adjuvant antiestrogen therapy -------------------------------------------------------------------------------------------------------------------------------------- Current treatment: Completed 2 cycles ofdose dense Adriamycin and Cytoxan, stopped for toxicities and hospitalization. Discontinued Taxol after 1 cycle given 10/21/2017  Continuing problems: 1.  Insomnia 2. low back pain We will administer IV fluids to help with fluid intake deficiencies. I will arrange for radiation oncology consultation with the patient. Return to clinic in 2 months for follow-up with [name].  I spent 25 minutes talking to the patient of which more than half was  spent in counseling and coordination of care.  Orders Placed This Encounter  Procedures  . CBC with Differential (Cancer Center Only)    Standing Status:   Future    Standing Expiration Date:   10/28/2018  . CMP ([facility] only)    Standing Status:   Future    Standing Expiration Date:   10/28/2018   The patient has a good understanding of the overall plan. she agrees with it. she will call with any problems that may develop before the next visit here.   Harriette Ohara, MD 10/28/17

## 2017-10-28 NOTE — Patient Instructions (Signed)
Implanted Port Home Guide An implanted port is a type of central line that is placed under the skin. Central lines are used to provide IV access when treatment or nutrition needs to be given through a person's veins. Implanted ports are used for long-term IV access. An implanted port may be placed because:  You need IV medicine that would be irritating to the small veins in your hands or arms.  You need long-term IV medicines, such as antibiotics.  You need IV nutrition for a long period.  You need frequent blood draws for lab tests.  You need dialysis.  Implanted ports are usually placed in the chest area, but they can also be placed in the upper arm, the abdomen, or the leg. An implanted port has two main parts:  Reservoir. The reservoir is round and will appear as a small, raised area under your skin. The reservoir is the part where a needle is inserted to give medicines or draw blood.  Catheter. The catheter is a thin, flexible tube that extends from the reservoir. The catheter is placed into a large vein. Medicine that is inserted into the reservoir goes into the catheter and then into the vein.  How will I care for my incision site? Do not get the incision site wet. Bathe or shower as directed by your health care provider. How is my port accessed? Special steps must be taken to access the port:  Before the port is accessed, a numbing cream can be placed on the skin. This helps numb the skin over the port site.  Your health care provider uses a sterile technique to access the port. ? Your health care provider must put on a mask and sterile gloves. ? The skin over your port is cleaned carefully with an antiseptic and allowed to dry. ? The port is gently pinched between sterile gloves, and a needle is inserted into the port.  Only "non-coring" port needles should be used to access the port. Once the port is accessed, a blood return should be checked. This helps ensure that the port  is in the vein and is not clogged.  If your port needs to remain accessed for a constant infusion, a clear (transparent) bandage will be placed over the needle site. The bandage and needle will need to be changed every week, or as directed by your health care provider.  Keep the bandage covering the needle clean and dry. Do not get it wet. Follow your health care provider's instructions on how to take a shower or bath while the port is accessed.  If your port does not need to stay accessed, no bandage is needed over the port.  What is flushing? Flushing helps keep the port from getting clogged. Follow your health care provider's instructions on how and when to flush the port. Ports are usually flushed with saline solution or a medicine called heparin. The need for flushing will depend on how the port is used.  If the port is used for intermittent medicines or blood draws, the port will need to be flushed: ? After medicines have been given. ? After blood has been drawn. ? As part of routine maintenance.  If a constant infusion is running, the port may not need to be flushed.  How long will my port stay implanted? The port can stay in for as long as your health care provider thinks it is needed. When it is time for the port to come out, surgery will be   done to remove it. The procedure is similar to the one performed when the port was put in. When should I seek immediate medical care? When you have an implanted port, you should seek immediate medical care if:  You notice a bad smell coming from the incision site.  You have swelling, redness, or drainage at the incision site.  You have more swelling or pain at the port site or the surrounding area.  You have a fever that is not controlled with medicine.  This information is not intended to replace advice given to you by your health care provider. Make sure you discuss any questions you have with your health care provider. Document  Released: 09/24/2005 Document Revised: 03/01/2016 Document Reviewed: 06/01/2013 Elsevier Interactive Patient Education  2017 [employer]  

## 2017-10-28 NOTE — Progress Notes (Signed)
Per Dr.[name], pt will not be getting chemo treatment at this time. Pt will, however, be receiving IVF's today. Cancelled all future chemo appts. Notified infusion RN and pharmacy. Orders placed in [facility] for IVF's

## 2017-10-28 NOTE — Patient Instructions (Signed)
Dehydration, Adult Dehydration is a condition in which there is not enough fluid or water in the body. This happens when you lose more fluids than you take in. Important organs, such as the kidneys, brain, and heart, cannot function without a proper amount of fluids. Any loss of fluids from the body can lead to dehydration. Dehydration can range from mild to severe. This condition should be treated right away to prevent it from becoming severe. What are the causes? This condition may be caused by:  Vomiting.  Diarrhea.  Excessive sweating, such as from heat exposure or exercise.  Not drinking enough fluid, especially: ? When ill. ? While doing activity that requires a lot of energy.  Excessive urination.  Fever.  Infection.  Certain medicines, such as medicines that cause the body to lose excess fluid (diuretics).  Inability to access safe drinking water.  Reduced physical ability to get adequate water and food.  What increases the risk? This condition is more likely to develop in people:  Who have a poorly controlled long-term (chronic) illness, such as diabetes, heart disease, or kidney disease.  Who are age 65 or older.  Who are disabled.  Who live in a place with high altitude.  Who play endurance sports.  What are the signs or symptoms? Symptoms of mild dehydration may include:  Thirst.  Dry lips.  Slightly dry mouth.  Dry, warm skin.  Dizziness. Symptoms of moderate dehydration may include:  Very dry mouth.  Muscle cramps.  Dark urine. Urine may be the color of tea.  Decreased urine production.  Decreased tear production.  Heartbeat that is irregular or faster than normal (palpitations).  Headache.  Light-headedness, especially when you stand up from a sitting position.  Fainting (syncope). Symptoms of severe dehydration may include:  Changes in skin, such as: ? Cold and clammy skin. ? Blotchy (mottled) or pale skin. ? Skin that does  not quickly return to normal after being lightly pinched and released (poor skin turgor).  Changes in body fluids, such as: ? Extreme thirst. ? No tear production. ? Inability to sweat when body temperature is high, such as in hot weather. ? Very little urine production.  Changes in vital signs, such as: ? Weak pulse. ? Pulse that is more than 100 beats a minute when sitting still. ? Rapid breathing. ? Low blood pressure.  Other changes, such as: ? Sunken eyes. ? Cold hands and feet. ? Confusion. ? Lack of energy (lethargy). ? Difficulty waking up from sleep. ? Short-term weight loss. ? Unconsciousness. How is this diagnosed? This condition is diagnosed based on your symptoms and a physical exam. Blood and urine tests may be done to help confirm the diagnosis. How is this treated? Treatment for this condition depends on the severity. Mild or moderate dehydration can often be treated at home. Treatment should be started right away. Do not wait until dehydration becomes severe. Severe dehydration is an emergency and it needs to be treated in a hospital. Treatment for mild dehydration may include:  Drinking more fluids.  Replacing salts and minerals in your blood (electrolytes) that you may have lost. Treatment for moderate dehydration may include:  Drinking an oral rehydration solution (ORS). This is a drink that helps you replace fluids and electrolytes (rehydrate). It can be found at pharmacies and retail stores. Treatment for severe dehydration may include:  Receiving fluids through an IV tube.  Receiving an electrolyte solution through a feeding tube that is passed through your nose   and into your stomach (nasogastric tube, or NG tube).  Correcting any abnormalities in electrolytes.  Treating the underlying cause of dehydration. Follow these instructions at home:  If directed by your health care provider, drink an ORS: ? Make an ORS by following instructions on the  package. ? Start by drinking small amounts, about  cup (120 mL) every 5-10 minutes. ? Slowly increase how much you drink until you have taken the amount recommended by your health care provider.  Drink enough clear fluid to keep your urine clear or pale yellow. If you were told to drink an ORS, finish the ORS first, then start slowly drinking other clear fluids. Drink fluids such as: ? Water. Do not drink only water. Doing that can lead to having too little salt (sodium) in the body (hyponatremia). ? Ice chips. ? Fruit juice that you have added water to (diluted fruit juice). ? Low-calorie sports drinks.  Avoid: ? Alcohol. ? Drinks that contain a lot of sugar. These include high-calorie sports drinks, fruit juice that is not diluted, and soda. ? Caffeine. ? Foods that are greasy or contain a lot of fat or sugar.  Take over-the-counter and prescription medicines only as told by your health care provider.  Do not take sodium tablets. This can lead to having too much sodium in the body (hypernatremia).  Eat foods that contain a healthy balance of electrolytes, such as bananas, oranges, potatoes, tomatoes, and spinach.  Keep all follow-up visits as told by your health care provider. This is important. Contact a health care provider if:  You have abdominal pain that: ? Gets worse. ? Stays in one area (localizes).  You have a rash.  You have a stiff neck.  You are more irritable than usual.  You are sleepier or more difficult to wake up than usual.  You feel weak or dizzy.  You feel very thirsty.  You have urinated only a small amount of very dark urine over 6-8 hours. Get help right away if:  You have symptoms of severe dehydration.  You cannot drink fluids without vomiting.  Your symptoms get worse with treatment.  You have a fever.  You have a severe headache.  You have vomiting or diarrhea that: ? Gets worse. ? Does not go away.  You have blood or green matter  (bile) in your vomit.  You have blood in your stool. This may cause stool to look black and tarry.  You have not urinated in 6-8 hours.  You faint.  Your heart rate while sitting still is over 100 beats a minute.  You have trouble breathing. This information is not intended to replace advice given to you by your health care provider. Make sure you discuss any questions you have with your health care provider. Document Released: 09/24/2005 Document Revised: 04/20/2016 Document Reviewed: 11/18/2015 Elsevier Interactive Patient Education  2018 [employer]  

## 2017-10-28 NOTE — Progress Notes (Signed)
72 year old female diagnosed with breast cancer.  Past medical history includes osteopenia, hypercholesterolemia, major depression and anxiety.  Medications include calcium, Ativan, Zofran, Compazine, and Zoloft.  Labs include albumin 3.9 improved to 4.1 on January 14.  Height: 61 inches. Weight: 136.3 pounds January 18. Usual body weight: 150 pounds in September. BMI: 25.75.  Patient reports she will not receive any more chemotherapy but plans on having radiation treatments. She no longer has a taste for sweets. Patient complains of fatigue and being uncomfortable.  She denies pain. Patient reports she has not been eating fruits and vegetables.  She has started to drink smoothies.  Patient consumes very small amounts of food throughout the day.  Nutrition diagnosis:  Food and nutrition related knowledge deficit related to breast cancer and associated treatments as evidenced by no prior need for nutrition related information.  Intervention: I educated patient on the importance of increasing both calories and protein. Recommended oral nutrition supplements. Suggested patient could add whey protein powder to fruit smoothies. Reviewed importance of increased fluids and provided fact sheet on dehydration. Questions were answered.  Teach back method used.  Contact information provided.  Monitoring, evaluation, goals: Patient will tolerate increased calories and protein for weight maintenance.  Next visit: Patient prefers to contact me for follow-up.  **Disclaimer: This note was dictated with voice recognition software. Similar sounding words can inadvertently be transcribed and this note may contain transcription errors which may not have been corrected upon publication of note.**

## 2017-10-28 NOTE — Telephone Encounter (Signed)
Canceled all future appointments per provider request. Patient scheduled per 1/21 los.

## 2017-10-28 NOTE — Telephone Encounter (Signed)
No 1/18 los.  °

## 2017-10-30 ENCOUNTER — Ambulatory Visit: Payer: Medicare Other

## 2017-10-30 DIAGNOSIS — M25611 Stiffness of right shoulder, not elsewhere classified: Secondary | ICD-10-CM | POA: Diagnosis not present

## 2017-10-30 DIAGNOSIS — M6281 Muscle weakness (generalized): Secondary | ICD-10-CM | POA: Diagnosis not present

## 2017-10-30 DIAGNOSIS — R293 Abnormal posture: Secondary | ICD-10-CM | POA: Diagnosis not present

## 2017-10-30 DIAGNOSIS — R262 Difficulty in walking, not elsewhere classified: Secondary | ICD-10-CM | POA: Diagnosis not present

## 2017-10-30 NOTE — Therapy (Signed)
[place], [place], [address] Phone: [phone]   Fax:  [phone]  Physical Therapy Treatment  Patient Details  Name: Gloria Ware MRN: [id number] Date of Birth: 01-29-1946 Referring Provider: Dr. [name]   Encounter Date: 10/30/2017  PT End of Session - 10/30/17 1723    Visit Number  7    Number of Visits  21    Date for PT Re-Evaluation  12/03/17    PT Start Time  1347    PT Stop Time  1430    PT Time Calculation (min)  43 min    Activity Tolerance  Patient limited by fatigue;Patient tolerated treatment well    Behavior During Therapy  [facility] for tasks assessed/performed       Past Medical History:  Diagnosis Date  . Anxiety   . Breast CA ([place])   . Colon polyp 09/12/2005  . Depression   . Eczema    right forearm   . Family history of breast cancer   . Genetic testing 06/27/2017   Multi-Cancer panel (83 genes) @ [facility] - No pathogenic mutations detected  . Hearing loss   . High cholesterol   . Injury     left foot metatarsal fracture sustined 08-03-17 , treated with boot; patient  reports  improvement as of today  . Osteopenia 11/2016   T score -2.1 stable from prior DEXA  . PONV (postoperative nausea and vomiting)    post op nausea  . Tinnitus     Past Surgical History:  Procedure Laterality Date  . CARDIAC CATHETERIZATION     husband was having symtpoms and needed a stent; subsequently she states she started " to feel something"  but thinks it was " all in my head bc of my hbsand". subsequently saw same cardiologist as her husband who sent for treadmill stress that had inconcluisicve results so she was sent for cardiac catherization which she states was " definitely negative "   . cataract surg    . DILATION AND CURETTAGE OF UTERUS    . IR CV LINE INJECTION  09/30/2017  . MASTECTOMY W/ SENTINEL NODE BIOPSY Right 08/06/2017   Procedure: RIGHT MASTECTOMY WITH SENTINEL LYMPH  NODE BIOPSY;  Surgeon: [name], MD;  Location: [place];  Service: General;  Laterality: Right;  . MOUTH SURGERY    . PORTACATH PLACEMENT N/A 09/12/2017   Procedure: INSERTION PORT-A-CATH;  Surgeon: [name], MD;  Location: [facility];  Service: General;  Laterality: N/A;  . RADIOACTIVE SEED GUIDED AXILLARY SENTINEL LYMPH NODE Right 08/06/2017   Procedure: SEED TARGETED AXILLARY LYMPH NODE EXCISION;  Surgeon: [name], MD;  Location: [place];  Service: General;  Laterality: Right;    There were no vitals filed for this visit.  Subjective Assessment - 10/30/17 1354    Subjective  I saw Dr. Lindi Adie Monday and he decided to stop all future chemo treatments since I've been so weak lately. He said I got enough chemo and everything looked good. I'm ready to try to get better, I'm just still so weak though. I also received fluids thru IVF Monday.     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 It measured 1 cm and is located in the upper inner quadrant. She also has a fairly large lipoma present on her right superior  shoulder. She underwent a right mastectomy and sentinel node biopsy on 08/06/17. She is wearing a boot on her left foot due to a fall and foot fracture 08/04/17. Her orthopedist for her foot is Dr. Doran Durand.     Patient Stated Goals  Make sure my arm is ok and that I'm strong enough for chemotherapy.    Currently in Pain?  No/denies                      [facility] Adult PT Treatment/Exercise - 10/30/17 0001      Elbow Exercises   Elbow Flexion  Strengthening;Both;10 reps;Seated;Bar weights/barbell 1 lb weight on wrist      Knee/Hip Exercises: Standing   Heel Raises  Both;2 sets;10 reps    Hip Flexion  Stengthening;Right;Left;10 reps;Knee bent 1 lb each ankle at counter    Hip Abduction  Right;Left;10  reps;Knee straight;Stengthening 1 lb each ankle at counter    Hip Extension  Stengthening;Right;Left;10 reps 1 lb each ankle at counter    Other Standing Knee Exercises  Bil sidestepping along counter in room 2x each way with SBA      Knee/Hip Exercises: Seated   Long Arc Quad  Strengthening;Right;Left;2 sets;5 reps;Weights 5 sec holds    Long Arc Quad Weight  1 lbs.    Marching  Strengthening;Right;Left;2 sets;5 reps;Weights    Marching Weights  1 lbs.      Shoulder Exercises: Seated   Flexion  AROM;Strengthening;Right;Left;10 reps tried 1 lb but too heavy                   Breast Clinic Goals - 06/26/17 2119      Patient will be able to verbalize understanding of pertinent lymphedema risk reduction practices relevant to her diagnosis specifically related to skin care.   Time  1    Period  Days    Status  Achieved      Patient will be able to return demonstrate and/or verbalize understanding of the post-op home exercise program related to regaining shoulder range of motion.   Time  1    Period  Days    Status  Achieved      Patient will be able to verbalize understanding of the importance of attending the postoperative After Breast Cancer Class for further lymphedema risk reduction education and therapeutic exercise.   Time  1    Period  Days    Status  Achieved       Long Term Clinic Goals - 09/02/17 1504      CC Long Term Goal  #1   Title  Patient will perform home exercise program correctly and independently.    Time  8    Period  Weeks    Status  New      CC Long Term Goal  #2   Title  Patient will demonstrate right shoulder active flexion at >/= 130 degrees for increased ease reaching overhead.    Baseline  111 degrees today    Time  8    Period  Weeks    Status  New      CC Long Term Goal  #3   Title  Patient will demonstrate right shoulder active abduction at >/= 137 degrees for increased ease reaching overhead.    Baseline  125 degrees today     Time  8    Period  Weeks    Status  New      CC Long Term  Goal  #4   Title  Patient will report she is able to participate in a regular walking program for 30 minutes, 5-6 days per week for reducing recurrence risk and better tolerance of chemotherapy.    Baseline  Unable to walk long distance with boot on.    Time  8    Period  Weeks    Status  New      CC Long Term Goal  #5   Title  Patient will verbalize understanding of risk reduction practices related to lymphedema.    Time  8    Period  Weeks    Status  New      CC Long Term Goal  #6   Title  Quick DASH score reduced to </= 4 for improved function of right arm.    Time  8    Period  Weeks    Status  New      Additional Goals   Additional Goals  Yes         Plan - 10/30/17 1723    Clinical Impression Statement  Pt requires HHA for all sit-stand though is independent with motion, she just reports feeling unstable. Have been walking her out to carport while husband brings car at end of session due to pt feeling so fatigued. She is slowly improving at each session tolerating 1 lb to some exercises and able to stand longer today as well. Pt is pleased that she was able tolerate longer session though did report mod-max fatigue at end of session. She continues with HEP and increasing her walking routine around the house as much as able. Pt will no longer be receiving chemo treatments.     Rehab Potential  Excellent    Clinical Impairments Affecting Rehab Potential  Recently healed Lt foot fracture; started chemo 09/16/17 but has now switched to Taxol due to poor tolerance/dec WBC=pt verry weak; pt no longer receiving chemo    PT Frequency  2x / week    PT Duration  8 weeks    PT Treatment/Interventions  Therapeutic exercise;Patient/family education;Balance training;Neuromuscular re-education;Gait training;ADLs/Self Care Home Management;Therapeutic activities;Functional mobility training;Manual techniques;Passive range of  motion;Scar mobilization    PT Next Visit Plan  Cont Nustep when pt feels able and review HEP prn; cont Rt shoulder ROM (though pt reports this not limiting her) and LE strength    Consulted and Agree with Plan of Care  Patient       Patient will benefit from skilled therapeutic intervention in order to improve the following deficits and impairments:  Postural dysfunction, Decreased knowledge of precautions, Pain, Impaired UE functional use, Decreased range of motion, Difficulty walking, Decreased activity tolerance, Decreased strength  Visit Diagnosis: Muscle weakness (generalized)  Abnormal posture  Difficulty in walking, not elsewhere classified     Problem List Patient Active Problem List   Diagnosis Date Noted  . Insomnia 10/07/2017  . Dehydration 10/07/2017  . Port-A-Cath in place 09/30/2017  . Breast cancer metastasized to multiple sites, right ([place]) 08/06/2017  . Genetic testing 06/27/2017  . Family history of breast cancer   . Breast CA ([place])   . Malignant neoplasm of upper-inner quadrant of right breast in female, estrogen receptor positive ([place]) 06/26/2017    Otelia Limes, PTA 10/30/2017, 5:30 PM  [place], [place], [address] Phone: [phone]   Fax:  [phone]  Name: ADISEN BENNION MRN: [id number] Date of Birth: [birth year]

## 2017-11-01 ENCOUNTER — Encounter: Payer: Self-pay | Admitting: Radiation Oncology

## 2017-11-01 ENCOUNTER — Ambulatory Visit: Payer: Medicare Other | Admitting: Physical Therapy

## 2017-11-01 ENCOUNTER — Encounter: Payer: Self-pay | Admitting: Physical Therapy

## 2017-11-01 DIAGNOSIS — R293 Abnormal posture: Secondary | ICD-10-CM | POA: Diagnosis not present

## 2017-11-01 DIAGNOSIS — M6281 Muscle weakness (generalized): Secondary | ICD-10-CM

## 2017-11-01 DIAGNOSIS — R262 Difficulty in walking, not elsewhere classified: Secondary | ICD-10-CM | POA: Diagnosis not present

## 2017-11-01 DIAGNOSIS — M25611 Stiffness of right shoulder, not elsewhere classified: Secondary | ICD-10-CM

## 2017-11-01 NOTE — Therapy (Addendum)
[place], [place], [address] Phone: [phone]   Fax:  [phone]  Physical Therapy Treatment  Patient Details  Name: Gloria Ware MRN: [id number] Date of Birth: Jul 01, 1946 Referring Provider: Dr. [name]   Encounter Date: 11/01/2017  PT End of Session - 11/01/17 1108    Visit Number  8    Number of Visits  21    Date for PT Re-Evaluation  12/03/17    PT Start Time  1020    PT Stop Time  1100    PT Time Calculation (min)  40 min    Activity Tolerance  Patient tolerated treatment well;Patient limited by fatigue    Behavior During Therapy  [facility] for tasks assessed/performed       Past Medical History:  Diagnosis Date  . Anxiety   . Breast CA ([place])   . Colon polyp 09/12/2005  . Depression   . Eczema    right forearm   . Family history of breast cancer   . Genetic testing 06/27/2017   Multi-Cancer panel (83 genes) @ [facility] - No pathogenic mutations detected  . Hearing loss   . High cholesterol   . Injury     left foot metatarsal fracture sustined 08-03-17 , treated with boot; patient  reports  improvement as of today  . Osteopenia 11/2016   T score -2.1 stable from prior DEXA  . PONV (postoperative nausea and vomiting)    post op nausea  . Tinnitus     Past Surgical History:  Procedure Laterality Date  . CARDIAC CATHETERIZATION     husband was having symtpoms and needed a stent; subsequently she states she started " to feel something"  but thinks it was " all in my head bc of my hbsand". subsequently saw same cardiologist as her husband who sent for treadmill stress that had inconcluisicve results so she was sent for cardiac catherization which she states was " definitely negative "   . cataract surg    . DILATION AND CURETTAGE OF UTERUS    . IR CV LINE INJECTION  09/30/2017  . MASTECTOMY W/ SENTINEL NODE BIOPSY Right 08/06/2017   Procedure: RIGHT MASTECTOMY WITH SENTINEL LYMPH  NODE BIOPSY;  Surgeon: [name], MD;  Location: [place];  Service: General;  Laterality: Right;  . MOUTH SURGERY    . PORTACATH PLACEMENT N/A 09/12/2017   Procedure: INSERTION PORT-A-CATH;  Surgeon: [name], MD;  [facility];  Service: General;  Laterality: N/A;  . RADIOACTIVE SEED GUIDED AXILLARY SENTINEL LYMPH NODE Right 08/06/2017   Procedure: SEED TARGETED AXILLARY LYMPH NODE EXCISION;  Surgeon: [name], MD;  Location: [place];  Service: General;  Laterality: Right;    There were no vitals filed for this visit.  Subjective Assessment - 11/01/17 1024    Subjective  I have a consultation next week for radiation. Today I woke up very uncomfortable. I have trouble sleeping and then the shaking starts.     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 It measured 1 cm and is located in the upper inner quadrant. 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 She underwent a right mastectomy and sentinel node biopsy on 08/06/17. She is wearing a boot on her left foot due to a fall and foot  fracture 08/04/17. Her orthopedist for her foot is Dr. Doran Durand.     Patient Stated Goals  Make sure my arm is ok and that I'm strong enough for chemotherapy.    Currently in Pain?  No/denies    Pain Score  0-No pain                      [facility] Adult PT Treatment/Exercise - 11/01/17 0001      Elbow Exercises   Elbow Flexion  Strengthening;Both;10 reps;Seated;Bar weights/barbell 1 lb weight on wrist      Knee/Hip Exercises: Standing   Heel Raises  Both;2 sets;10 reps    Hip Flexion  Stengthening;Right;Left;10 reps;Knee bent      Knee/Hip Exercises: Seated   Long Arc Quad  Strengthening;Right;Left;2 sets;5 reps;Weights 5 sec holds    Long Arc Quad Weight  1 lbs.    Marching   Strengthening;Right;Left;2 sets;5 reps;Weights      Shoulder Exercises: Seated   Flexion  AROM;Strengthening;Right;Left;10 reps                   Breast Clinic Goals - 06/26/17 2119      Patient will be able to verbalize understanding of pertinent lymphedema risk reduction practices relevant to her diagnosis specifically related to skin care.   Time  1    Period  Days    Status  Achieved      Patient will be able to return demonstrate and/or verbalize understanding of the post-op home exercise program related to regaining shoulder range of motion.   Time  1    Period  Days    Status  Achieved      Patient will be able to verbalize understanding of the importance of attending the postoperative After Breast Cancer Class for further lymphedema risk reduction education and therapeutic exercise.   Time  1    Period  Days    Status  Achieved       Long Term Clinic Goals - 11/01/17 1025      CC Long Term Goal  #1   Title  Patient will perform home exercise program correctly and independently.    Time  8    Period  Weeks    Status  On-going      CC Long Term Goal  #2   Title  Patient will demonstrate right shoulder active flexion at >/= 130 degrees for increased ease reaching overhead.    Baseline  111 degrees today, 11/01/17- 125 degrees    Time  8    Period  Weeks    Status  On-going      CC Long Term Goal  #3   Title  Patient will demonstrate right shoulder active abduction at >/= 137 degrees for increased ease reaching overhead.    Baseline  125 degrees today, 11/01/17- 121 degrees     Time  8    Period  Weeks    Status  On-going      CC Long Term Goal  #4   Title  Patient will report she is able to participate in a regular walking program for 30 minutes, 5-6 days per week for reducing recurrence risk and better tolerance of chemotherapy.    Baseline  Unable to walk long distance with boot on., 11/01/17- pt has been unable to walk due to shakiness    Time  8     Period  Weeks    Status  On-going  CC Long Term Goal  #5   Title  Patient will verbalize understanding of risk reduction practices related to lymphedema.    Baseline  11/01/17- pt required some cueing for this    Time  8    Period  Weeks    Status  On-going      CC Long Term Goal  #6   Title  Quick DASH score reduced to </= 4 for improved function of right arm.    Time  8    Period  Weeks    Status  On-going         Plan - 11/01/17 1106    Clinical Impression Statement  Pt still requires HHA for all sit to stand due to skakiness and fear of falling. She would benefit from a front wheeled walker for more stability. At end of session walked pt to carport while husband pulled car around due to pt's fatigue and shakiness. She was more fatigued today and was not able to complete all exercies that were completed at last session. She was very motivated to participate despite being limited by fatigue.     Clinical Impairments Affecting Rehab Potential  Recently healed Lt foot fracture; started chemo 09/16/17 but has now switched to Taxol due to poor tolerance/dec WBC=pt verry weak; pt no longer receiving chemo    PT Frequency  2x / week    PT Duration  8 weeks    PT Treatment/Interventions  Therapeutic exercise;Patient/family education;Balance training;Neuromuscular re-education;Gait training;ADLs/Self Care Home Management;Therapeutic activities;Functional mobility training;Manual techniques;Passive range of motion;Scar mobilization    PT Next Visit Plan  Cont Nustep when pt feels able and review HEP prn; cont Rt shoulder ROM (though pt reports this not limiting her) and LE strength    PT Home Exercise Plan  Seated LE strength for now    Consulted and Agree with Plan of Care  Patient       Patient will benefit from skilled therapeutic intervention in order to improve the following deficits and impairments:  Postural dysfunction, Decreased knowledge of precautions, Pain, Impaired UE  functional use, Decreased range of motion, Difficulty walking, Decreased activity tolerance, Decreased strength  Visit Diagnosis: Muscle weakness (generalized)  Abnormal posture  Difficulty in walking, not elsewhere classified  Stiffness of right shoulder, not elsewhere classified     Problem List Patient Active Problem List   Diagnosis Date Noted  . Insomnia 10/07/2017  . Dehydration 10/07/2017  . Port-A-Cath in place 09/30/2017  . Breast cancer metastasized to multiple sites, right ([place]) 08/06/2017  . Genetic testing 06/27/2017  . Family history of breast cancer   . Breast CA ([place])   . Malignant neoplasm of upper-inner quadrant of right breast in female, estrogen receptor positive ([place]) 06/26/2017    Allyson Sabal [facility] 11/01/2017, [place], [place], [address] Phone: [phone]   Fax:  [phone]  Name: Gloria Ware MRN: [id number] Date of Birth: [birth year]  Manus Gunning, PT 11/01/17 11:14 AM

## 2017-11-04 ENCOUNTER — Encounter: Payer: Medicare Other | Admitting: Physical Therapy

## 2017-11-04 ENCOUNTER — Other Ambulatory Visit: Payer: Medicare Other

## 2017-11-04 ENCOUNTER — Ambulatory Visit: Payer: Medicare Other

## 2017-11-05 ENCOUNTER — Encounter (HOSPITAL_COMMUNITY): Payer: Self-pay

## 2017-11-05 ENCOUNTER — Emergency Department (HOSPITAL_COMMUNITY)
Admission: EM | Admit: 2017-11-05 | Discharge: 2017-11-05 | Disposition: A | Discharge status: Home / Self Care | Payer: Medicare Other | Attending: Emergency Medicine | Admitting: Emergency Medicine

## 2017-11-05 ENCOUNTER — Other Ambulatory Visit: Payer: Self-pay

## 2017-11-05 DIAGNOSIS — Z79899 Other long term (current) drug therapy: Secondary | ICD-10-CM | POA: Insufficient documentation

## 2017-11-05 DIAGNOSIS — R52 Pain, unspecified: Secondary | ICD-10-CM | POA: Diagnosis not present

## 2017-11-05 DIAGNOSIS — M791 Myalgia, unspecified site: Secondary | ICD-10-CM

## 2017-11-05 DIAGNOSIS — Z853 Personal history of malignant neoplasm of breast: Secondary | ICD-10-CM | POA: Diagnosis not present

## 2017-11-05 DIAGNOSIS — Z7982 Long term (current) use of aspirin: Secondary | ICD-10-CM | POA: Insufficient documentation

## 2017-11-05 DIAGNOSIS — E7800 Pure hypercholesterolemia, unspecified: Secondary | ICD-10-CM | POA: Insufficient documentation

## 2017-11-05 DIAGNOSIS — R251 Tremor, unspecified: Secondary | ICD-10-CM | POA: Insufficient documentation

## 2017-11-05 LAB — CBC WITH DIFFERENTIAL/PLATELET
Basophils Absolute: 0.1 10*3/uL (ref 0.0–0.1)
Basophils Relative: 1 %
EOS ABS: 0.1 10*3/uL (ref 0.0–0.7)
EOS PCT: 1 %
HCT: 29.9 % — ABNORMAL LOW (ref 36.0–46.0)
Hemoglobin: 10.5 g/dL — ABNORMAL LOW (ref 12.0–15.0)
LYMPHS PCT: 25 %
Lymphs Abs: 1.1 10*3/uL (ref 0.7–4.0)
MCH: 30.7 pg (ref 26.0–34.0)
MCHC: 35.1 g/dL (ref 30.0–36.0)
MCV: 87.4 fL (ref 78.0–100.0)
MONO ABS: 0.5 10*3/uL (ref 0.1–1.0)
Monocytes Relative: 12 %
Neutro Abs: 2.6 10*3/uL (ref 1.7–7.7)
Neutrophils Relative %: 61 %
PLATELETS: 260 10*3/uL (ref 150–400)
RBC: 3.42 MIL/uL — AB (ref 3.87–5.11)
RDW: 15.7 % — AB (ref 11.5–15.5)
WBC: 4.3 10*3/uL (ref 4.0–10.5)

## 2017-11-05 LAB — I-STAT CHEM 8, ED
BUN: 14 mg/dL (ref 6–20)
CHLORIDE: 100 mmol/L — AB (ref 101–111)
Calcium, Ion: 1.19 mmol/L (ref 1.15–1.40)
Creatinine, Ser: 0.7 mg/dL (ref 0.44–1.00)
GLUCOSE: 104 mg/dL — AB (ref 65–99)
HCT: 30 % — ABNORMAL LOW (ref 36.0–46.0)
HEMOGLOBIN: 10.2 g/dL — AB (ref 12.0–15.0)
Potassium: 3.8 mmol/L (ref 3.5–5.1)
SODIUM: 136 mmol/L (ref 135–145)
TCO2: 21 mmol/L — ABNORMAL LOW (ref 22–32)

## 2017-11-05 LAB — CK: CK TOTAL: 148 U/L (ref 38–234)

## 2017-11-05 MED ORDER — DIAZEPAM 5 MG PO TABS
5.0000 mg | ORAL_TABLET | Freq: Once | ORAL | Status: AC
  Administered 2017-11-05: 5 mg via ORAL
  Filled 2017-11-05: qty 1

## 2017-11-05 MED ORDER — SODIUM CHLORIDE 0.9 % IV BOLUS (SEPSIS)
1000.0000 mL | Freq: Once | INTRAVENOUS | Status: AC
  Administered 2017-11-05: 1000 mL via INTRAVENOUS

## 2017-11-05 MED ORDER — DIAZEPAM 5 MG PO TABS: 5.0000 mg | ORAL_TABLET | Freq: Two times a day (BID) | ORAL | 0 refills | Status: DC | PRN

## 2017-11-05 NOTE — ED Triage Notes (Signed)
Pt c/o worsening generalized tremors. Pt denies sob and cp. Pt denies pain. Pt stopped IV Chemo x2 weeks ago because she could tolerate it. Pt denies fevers at home. Pt A+OX4.

## 2017-11-05 NOTE — Discharge Instructions (Signed)
You were seen today for generalized discomfort and tremor.  Your workup is reassuring.  We will change her medications from Flexeril and Ativan to Valium which will provide some muscle relaxation as well as any antianxiety component.  Follow-up with your primary doctor and oncologist.

## 2017-11-05 NOTE — ED Provider Notes (Signed)
[facility] DEPT Provider Note   CSN: [id number] Arrival date & time: 11/05/17  0439     History   Chief Complaint Chief Complaint  Patient presents with  . CHEMO Card  . Tremors    HPI Gloria Ware is a 72 y.o. female.  HPI This is a 72 year old female with a history of breast cancer status post mastectomy on the right, anxiety, high cholesterol, osteopenia who presents with tremor and generalized body discomfort.  Patient reports that she has had to discontinue chemotherapy twice for similar symptoms in the past.  She reports resting tremor and inability to get comfortable in the bed.  She denies any specific pain but states in general she cannot get comfortable and has full body aches.  She does have some back pain at baseline.  She denies any weakness, numbness, tingling of lower extremities, bowel or bladder issues.  She is due for radiation and is being followed closely by her oncologist.  She was seen and evaluated for similar symptoms several weeks ago.  She takes Flexeril which initially seemed to help but does not help anymore.  She also takes Ativan for some anxiety.  She has been unable to sleep tonight.  She is unable to quantify her pain for me at this time.  Has any infectious symptoms or fevers.  Past Medical History:  Diagnosis Date  . Anxiety   . Breast CA ([place])   . Colon polyp 09/12/2005  . Depression   . Eczema    right forearm   . Family history of breast cancer   . Genetic testing 06/27/2017   Multi-Cancer panel (83 genes) @ [facility] - No pathogenic mutations detected  . Hearing loss   . High cholesterol   . Injury     left foot metatarsal fracture sustined 08-03-17 , treated with boot; patient  reports  improvement as of today  . Osteopenia 11/2016   T score -2.1 stable from prior DEXA  . PONV (postoperative nausea and vomiting)    post op nausea  . Tinnitus     Patient Active Problem List   Diagnosis Date Noted  .  Insomnia 10/07/2017  . Dehydration 10/07/2017  . Port-A-Cath in place 09/30/2017  . Breast cancer metastasized to multiple sites, right ([place]) 08/06/2017  . Genetic testing 06/27/2017  . Family history of breast cancer   . Breast CA ([place])   . Malignant neoplasm of upper-inner quadrant of right breast in female, estrogen receptor positive ([place]) 06/26/2017    Past Surgical History:  Procedure Laterality Date  . CARDIAC CATHETERIZATION     husband was having symtpoms and needed a stent; subsequently she states she started " to feel something"  but thinks it was " all in my head bc of my hbsand". subsequently saw same cardiologist as her husband who sent for treadmill stress that had inconcluisicve results so she was sent for cardiac catherization which she states was " definitely negative "   . cataract surg    . DILATION AND CURETTAGE OF UTERUS    . IR CV LINE INJECTION  09/30/2017  . MASTECTOMY W/ SENTINEL NODE BIOPSY Right 08/06/2017   Procedure: RIGHT MASTECTOMY WITH SENTINEL LYMPH NODE BIOPSY;  Surgeon: [name], MD;  Location: [facility];  Service: General;  Laterality: Right;  . MOUTH SURGERY    . PORTACATH PLACEMENT N/A 09/12/2017   Procedure: INSERTION PORT-A-CATH;  Surgeon: [name], MD;  Location: [facility];  Service: General;  Laterality: N/A;  . RADIOACTIVE SEED GUIDED AXILLARY SENTINEL LYMPH NODE Right 08/06/2017   Procedure: SEED TARGETED AXILLARY LYMPH NODE EXCISION;  Surgeon: [name], MD;  Location: [facility];  Service: General;  Laterality: Right;    OB History    Gravida Para Term Preterm AB Living   2 2       2    SAB TAB Ectopic Multiple Live Births                   Home Medications    Prior to Admission medications   Medication Sig Start Date End Date Taking? Authorizing Provider  acetaminophen (TYLENOL) 500 MG tablet Take 500 mg by mouth every 6 (six) hours as needed for mild pain, moderate pain or headache.   Yes  [provider]  aspirin 81 MG tablet Take 81 mg by mouth daily.     Yes [provider]  Calcium Carbonate-Vitamin D (CALTRATE 600+D PO) Take 1 tablet by mouth 2 (two) times daily.   Yes [provider]  oxyCODONE (OXY IR/ROXICODONE) 5 MG immediate release tablet Take 1 tablet (5 mg total) by mouth every 4 (four) hours as needed for severe pain. 10/17/17  Yes Nicholas Lose, MD  prochlorperazine (COMPAZINE) 10 MG tablet Take 10 mg by mouth every 6 (six) hours as needed for nausea or vomiting.   Yes [provider]  sertraline (ZOLOFT) 100 MG tablet Take 200 mg by mouth daily.    Yes [provider]  diazepam (VALIUM) 5 MG tablet Take 1 tablet (5 mg total) by mouth every 12 (twelve) hours as needed for anxiety or muscle spasms. 11/05/17   Luwana Butrick, Barbette Hair, MD  prochlorperazine (COMPAZINE) 10 MG tablet Take 1 tablet (10 mg total) by mouth every 6 (six) hours as needed (Nausea or vomiting). 10/15/17 11/05/17  Nicholas Lose, MD    Family History Family History  Problem Relation Age of Onset  . Breast cancer Sister 74       currently 51  . Hypertension Maternal Grandmother   . Stroke Maternal Grandmother   . Hypertension Maternal Grandfather   . Stroke Maternal Grandfather   . Breast cancer Cousin        unconfirmed in maternal first cousin  . Ovarian cancer Cousin        unconfirmed if ovarian vs. uterine in paternal first cousin    Social History Social History   Tobacco Use  . Smoking status: Never Smoker  . Smokeless tobacco: Never Used  Substance Use Topics  . Alcohol use: No    Alcohol/week: 0.0 oz    Frequency: Never    Comment: WINE - rarely  . Drug use: No     Allergies   Penicillins; Sulfa antibiotics; Atorvastatin; and Zetia [ezetimibe]   Review of Systems Review of Systems  Constitutional: Negative for fever.  Respiratory: Negative for shortness of breath.   Cardiovascular: Negative for chest pain.  Gastrointestinal:  Negative for abdominal pain, diarrhea, nausea and vomiting.  Genitourinary: Negative for dysuria.  Musculoskeletal: Positive for back pain and myalgias.  All other systems reviewed and are negative.    Physical Exam Updated Vital Signs BP 129/69 (BP Location: Right Arm)   Pulse (!) 109   Temp 97.6 F (36.4 C) (Oral)   Resp 16   LMP 07/30/1998   SpO2 94%   Physical Exam  Constitutional: She is oriented to person, place, and time.  Chronically ill-appearing but nontoxic, no acute distress, uncomfortable  appearing  HENT:  Head: Normocephalic and atraumatic.  Alopecia  Eyes: Pupils are equal, round, and reactive to light.  Cardiovascular: Regular rhythm and normal heart sounds.  No murmur heard. Tachycardia  Pulmonary/Chest: Effort normal. No respiratory distress. She has no wheezes.  Right mastectomy scar, left port palpable  Abdominal: Soft. Bowel sounds are normal. There is no tenderness.  Neurological: She is alert and oriented to person, place, and time.  5 out of 5 strength bilateral lower extremities, normal patellar reflexes bilaterally  Skin: Skin is warm and dry.  Psychiatric:  Anxious appearing  Nursing note and vitals reviewed.    ED Treatments / Results  Labs (all labs ordered are listed, but only abnormal results are displayed) Labs Reviewed  CBC WITH DIFFERENTIAL/PLATELET - Abnormal; Notable for the following components:      Result Value   RBC 3.42 (*)    Hemoglobin 10.5 (*)    HCT 29.9 (*)    RDW 15.7 (*)    All other components within normal limits  I-STAT CHEM 8, ED - Abnormal; Notable for the following components:   Chloride 100 (*)    Glucose, Bld 104 (*)    TCO2 21 (*)    Hemoglobin 10.2 (*)    HCT 30.0 (*)    All other components within normal limits  CK    EKG  EKG Interpretation None       Radiology No results found.  Procedures Procedures (including critical care time)  Medications Ordered in ED Medications  sodium  chloride 0.9 % bolus 1,000 mL (1,000 mLs Intravenous New Bag/Given 11/05/17 0644)  diazepam (VALIUM) tablet 5 mg (5 mg Oral Given 11/05/17 0630)     Initial Impression / Assessment and Plan / ED Course  I have reviewed the triage vital signs and the nursing notes.  Pertinent labs & imaging results that were available during my care of the patient were reviewed by me and considered in my medical decision making (see chart for details).     Presents with tremor and whole body aches.  She reports that she is unable to get comfortable in bed.  This is been ongoing and was previously related to chemotherapy.  She is no longer getting relief with Flexeril.  She appears uncomfortable but she is nontoxic.  Vital signs notable for tachycardia.  Lab work obtained and largely reassuring including CK.  Patient was given Valium for both antianxiety and muscle effect.  On recheck she states that she feels much better.  She was also given a liter of fluids.  Husband and patient are requesting discharge.  Recommend close follow-up with oncologist and primary physician.  After history, exam, and medical workup I feel the patient has been appropriately medically screened and is safe for discharge home. Pertinent diagnoses were discussed with the patient. Patient was given return precautions.   Final Clinical Impressions(s) / ED Diagnoses   Final diagnoses:  Tremor  Muscle discomfort    ED Discharge Orders        Ordered    diazepam (VALIUM) 5 MG tablet  Every 12 hours PRN     11/05/17 0731       Merryl Hacker, MD 11/05/17 [phone]

## 2017-11-06 ENCOUNTER — Ambulatory Visit: Payer: Medicare Other

## 2017-11-06 DIAGNOSIS — M25611 Stiffness of right shoulder, not elsewhere classified: Secondary | ICD-10-CM | POA: Diagnosis not present

## 2017-11-06 DIAGNOSIS — M6281 Muscle weakness (generalized): Secondary | ICD-10-CM

## 2017-11-06 DIAGNOSIS — R262 Difficulty in walking, not elsewhere classified: Secondary | ICD-10-CM | POA: Diagnosis not present

## 2017-11-06 DIAGNOSIS — R293 Abnormal posture: Secondary | ICD-10-CM | POA: Diagnosis not present

## 2017-11-06 NOTE — Progress Notes (Addendum)
Location of Breast Cancer:Upper-inner quadrant of right breast in female  Histology per Pathology Report:  Diagnosis 07-19-17 1. Breast, right, needle core biopsy, upper outer quadrant - LOBULAR NEOPLASIA (LOBULAR CARCINOMA IN SITU). 2. Breast, right, needle core biopsy - LOBULAR NEOPLASIA (LOBULAR CARCINOMA IN SITU).    Receptor Status: ER(), PR (), Her2-neu (.), Ki-()  Did patient present with symptoms (if so, please note symptoms) or was this found on screening mammography?: Screening detected right breast mass and calcifications   Past/Anticipated interventions by surgeon, if any:     Diagnosis 08-06-17 Dr. Faera Byerly 1. Breast, simple mastectomy, Right - INVASIVE LOBULAR CARCINOMA, GRADE II/III, SPANNING 0.6 CM. - LOBULAR CARCINOMA IN SITU. - THE SURGICAL RESECTION MARGINS ARE NEGATIVE FOR CARCINOMA. - SEE ONCOLOGY TABLE BELOW. 2. Lymph node, biopsy, Right Axillary - METASTATIC CARCINOMA IN 1 OF 1 LYMPH NODE (1/1). 3. Lymph node, sentinel, biopsy, Right - ISOLATED TUMOR CELLS IN 1 OF 1 LYMPH NODE. - SEE COMMENT. 4. Lymph node, sentinel, biopsy, Right - THERE IS NO EVIDENCE OF CARCINOMA IN 1 OF 1 LYMPH NODE (0/1). - SEE COMMENT. 5. Lymph node, sentinel, biopsy, Right - THERE IS NO EVIDENCE OF CARCINOMA IN 1 OF 1 LYMPH NODE (0/1). - SEE COMMENT. 6. Lymph node, sentinel, biopsy, Right - THERE IS NO EVIDENCE OF CARCINOMA IN 1 OF 1 LYMPH NODE (0/1). - SEE COMMENT. 7. Lymph node, sentinel, biopsy, Right - THERE IS NO EVIDENCE OF CARCINOMA IN 1 OF 1 LYMPH NODE (0/1).  Receptor Status: ER(95 %), PR (30 %), Her2-neu (No amplification was detected by immunohistochemistry.), Ki-(40 %)  Past/Anticipated interventions by medical oncology, if any: Dr. [name]  1. 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 adjuvant radiation therapy 3. adjuvant antiestrogen  therapy   Lymphedema issues, if any: No Rom to right arm good.  Receiving PT twice a week for mobility and strength Skin to right breast normal color: follow up appointment with Dr. Byerly in November 2018.  Plans to call the office about the port-a-cath removal.  Pain issues, if any: No, shakey today to valium last night.  SAFETY ISSUES:Yes  Prior radiation? : No  Pacemaker/ICD?: No  Possible current pregnancy?: No  Is the patient on methotrexate? : No  Menarche 13   G2 P 2   BC No   Menopause 51   HRT 2 years Wt Readings from Last 3 Encounters:  11/07/17 136 lb 6.4 oz (61.9 kg)  10/28/17 136 lb 12.8 oz (62.1 kg)  10/25/17 136 lb 4.8 oz (61.8 kg)  BP 122/60 (BP Location: Left Arm, Patient Position: Sitting, Cuff Size: Normal)   Pulse (!) 111   Temp (!) 97.5 F (36.4 C) (Oral)   Resp 18   Ht 5' 1" (1.549 m)   Wt 136 lb 6.4 oz (61.9 kg)   LMP 07/30/1998   SpO2 100%   BMI 25.77 kg/m   Current Complaints / other details:     Bryant, Tonie Blackwell, RN 11/06/2017,11:25 AM   

## 2017-11-06 NOTE — Therapy (Signed)
[facility] [place], [place], [address] Phone: [phone]   Fax:  [phone]  Physical Therapy Treatment  Patient Details  Name: Gloria Ware MRN: [id number] Date of Birth: 04-May-1946 Referring Provider: Dr. [name]   Encounter Date: 11/06/2017  PT End of Session - 11/06/17 1718    Visit Number  9    Number of Visits  21    Date for PT Re-Evaluation  12/03/17    PT Start Time  1620 Pt arrived late    PT Stop Time  1700    PT Time Calculation (min)  40 min    Activity Tolerance  Patient tolerated treatment well    Behavior During Therapy  [facility] for tasks assessed/performed       Past Medical History:  Diagnosis Date  . Anxiety   . Breast CA ([place])   . Colon polyp 09/12/2005  . Depression   . Eczema    right forearm   . Family history of breast cancer   . Genetic testing 06/27/2017   Multi-Cancer panel (83 genes) @ [facility] - No pathogenic mutations detected  . Hearing loss   . High cholesterol   . Injury     left foot metatarsal fracture sustined 08-03-17 , treated with boot; patient  reports  improvement as of today  . Osteopenia 11/2016   T score -2.1 stable from prior DEXA  . PONV (postoperative nausea and vomiting)    post op nausea  . Tinnitus     Past Surgical History:  Procedure Laterality Date  . CARDIAC CATHETERIZATION     husband was having symtpoms and needed a stent; subsequently she states she started " to feel something"  but thinks it was " all in my head bc of my hbsand". subsequently saw same cardiologist as her husband who sent for treadmill stress that had inconcluisicve results so she was sent for cardiac catherization which she states was " definitely negative "   . cataract surg    . DILATION AND CURETTAGE OF UTERUS    . IR CV LINE INJECTION  09/30/2017  . MASTECTOMY W/ SENTINEL NODE BIOPSY Right 08/06/2017   Procedure: RIGHT MASTECTOMY WITH SENTINEL LYMPH NODE  BIOPSY;  Surgeon: [name], MD;  Location: [facility];  Service: General;  Laterality: Right;  . MOUTH SURGERY    . PORTACATH PLACEMENT N/A 09/12/2017   Procedure: INSERTION PORT-A-CATH;  Surgeon: [name], MD;  Location: [facility];  Service: General;  Laterality: N/A;  . RADIOACTIVE SEED GUIDED AXILLARY SENTINEL LYMPH NODE Right 08/06/2017   Procedure: SEED TARGETED AXILLARY LYMPH NODE EXCISION;  Surgeon: [name], MD;  Location: [facility];  Service: General;  Laterality: Right;    There were no vitals filed for this visit.  Subjective Assessment - 11/06/17 1631    Subjective  I had to go to the ED yesterday because my tremors had gotten worse and they gave me Valium which helped immediately. So I have a prescription for that now. I feel a little better today though, just a little unsteady when I walk. Tomorrow I have a consultation for radiation.     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 It measured 1 cm and is located in the upper inner quadrant. She also has a fairly large lipoma present on her right  superior shoulder. She underwent a right mastectomy and sentinel node biopsy on 08/06/17. She is wearing a boot on her left foot due to a fall and foot fracture 08/04/17. Her orthopedist for her foot is Dr. Doran [facility].     Patient Stated Goals  Make sure my arm is ok and that I'm strong enough for chemotherapy.    Currently in Pain?  No/denies                      [facility] Adult PT Treatment/Exercise - 11/06/17 0001      Knee/Hip Exercises: Aerobic   Stationary Bike  No resistance x7 mins      Knee/Hip Exercises: Standing   Hip Abduction  Right;Left;10 reps;Knee straight;Stengthening At back of bike    Hip Extension  Stengthening;Right;Left;10 reps At back of bike    Forward Step Up  Right;Left;5 reps;Hand  Hold: 2;Step Height: 2" At back of bike with SBA for TKE keeping foot on step      Knee/Hip Exercises: Seated   Long Arc Quad  Strengthening;Right;Left;20 reps;Weights    Long Arc Quad Weight  1 lbs.    Ball Squeeze  20x with 5 sec holds    Other Seated Knee/Hip Exercises  Gluteal squeezes 20x    Marching  Strengthening;Right;Left;20 reps;Weights    Marching Weights  1 lbs.                   Breast Clinic Goals - 06/26/17 2119      Patient will be able to verbalize understanding of pertinent lymphedema risk reduction practices relevant to her diagnosis specifically related to skin care.   Time  1    Period  Days    Status  Achieved      Patient will be able to return demonstrate and/or verbalize understanding of the post-op home exercise program related to regaining shoulder range of motion.   Time  1    Period  Days    Status  Achieved      Patient will be able to verbalize understanding of the importance of attending the postoperative After Breast Cancer Class for further lymphedema risk reduction education and therapeutic exercise.   Time  1    Period  Days    Status  Achieved       Long Term Clinic Goals - 11/01/17 1025      CC Long Term Goal  #1   Title  Patient will perform home exercise program correctly and independently.    Time  8    Period  Weeks    Status  On-going      CC Long Term Goal  #2   Title  Patient will demonstrate right shoulder active flexion at >/= 130 degrees for increased ease reaching overhead.    Baseline  111 degrees today, 11/01/17- 125 degrees    Time  8    Period  Weeks    Status  On-going      CC Long Term Goal  #3   Title  Patient will demonstrate right shoulder active abduction at >/= 137 degrees for increased ease reaching overhead.    Baseline  125 degrees today, 11/01/17- 121 degrees     Time  8    Period  Weeks    Status  On-going      CC Long Term Goal  #4   Title  Patient will report she is able to participate  in a regular walking program  for 30 minutes, 5-6 days per week for reducing recurrence risk and better tolerance of chemotherapy.    Baseline  Unable to walk long distance with boot on., 11/01/17- pt has been unable to walk due to shakiness    Time  8    Period  Weeks    Status  On-going      CC Long Term Goal  #5   Title  Patient will verbalize understanding of risk reduction practices related to lymphedema.    Baseline  11/01/17- pt required some cueing for this    Time  8    Period  Weeks    Status  On-going      CC Long Term Goal  #6   Title  Quick DASH score reduced to </= 4 for improved function of right arm.    Time  8    Period  Weeks    Status  On-going         Plan - 11/06/17 1718    Clinical Impression Statement  Pt showed some good progress today, though her tremors still slow her activity, mostly transitioning to and from sit-stand. She was able to progress to recumbent bike, though with no resistance. By end of session pt was pleased with her activity tolerance today and reports will continue to try to progress her exercises and walking at home.     Rehab Potential  Excellent    Clinical Impairments Affecting Rehab Potential  Recently healed Lt foot fracture; started chemo 09/16/17 but has now switched to Taxol due to poor tolerance/dec WBC=pt verry weak; pt no longer receiving chemo    PT Frequency  2x / week    PT Duration  8 weeks    PT Treatment/Interventions  Therapeutic exercise;Patient/family education;Balance training;Neuromuscular re-education;Gait training;ADLs/Self Care Home Management;Therapeutic activities;Functional mobility training;Manual techniques;Passive range of motion;Scar mobilization    PT Next Visit Plan  Cont Nustep or bike as able and review HEP prn and LE strength and balance activities, encouraging pt throughout session    Consulted and Agree with Plan of Care  Patient       Patient will benefit from skilled therapeutic intervention in order  to improve the following deficits and impairments:  Postural dysfunction, Decreased knowledge of precautions, Pain, Impaired UE functional use, Decreased range of motion, Difficulty walking, Decreased activity tolerance, Decreased strength  Visit Diagnosis: Muscle weakness (generalized)  Abnormal posture  Difficulty in walking, not elsewhere classified     Problem List Patient Active Problem List   Diagnosis Date Noted  . Insomnia 10/07/2017  . Dehydration 10/07/2017  . Port-A-Cath in place 09/30/2017  . Breast cancer metastasized to multiple sites, right ([employer]) 08/06/2017  . Genetic testing 06/27/2017  . Family history of breast cancer   . Breast CA ([place])   . Malignant neoplasm of upper-inner quadrant of right breast in female, estrogen receptor positive ([place]) 06/26/2017    Otelia Limes, PTA 11/06/2017, 5:25 PM  [place], [place], [address] Phone: [phone]   Fax:  [phone]  Name: NASIAH POLINSKY MRN: [id number] Date of Birth: 1946/01/15

## 2017-11-07 ENCOUNTER — Encounter: Payer: Self-pay | Admitting: Radiation Oncology

## 2017-11-07 ENCOUNTER — Ambulatory Visit
Admission: RE | Admit: 2017-11-07 | Discharge: 2017-11-07 | Disposition: A | Discharge status: Home / Self Care | Payer: Medicare Other | Source: Ambulatory Visit | Attending: Radiation Oncology | Admitting: Radiation Oncology

## 2017-11-07 VITALS — BP 122/60 | HR 111 | Temp 97.5°F | Resp 18 | Ht 61.0 in | Wt 136.4 lb

## 2017-11-07 DIAGNOSIS — Z170 Estrogen receptor positive status [ER+]: Secondary | ICD-10-CM | POA: Insufficient documentation

## 2017-11-07 DIAGNOSIS — C773 Secondary and unspecified malignant neoplasm of axilla and upper limb lymph nodes: Secondary | ICD-10-CM | POA: Diagnosis not present

## 2017-11-07 DIAGNOSIS — R251 Tremor, unspecified: Secondary | ICD-10-CM | POA: Diagnosis not present

## 2017-11-07 DIAGNOSIS — C50211 Malignant neoplasm of upper-inner quadrant of right female breast: Secondary | ICD-10-CM

## 2017-11-07 DIAGNOSIS — Z9011 Acquired absence of right breast and nipple: Secondary | ICD-10-CM | POA: Diagnosis not present

## 2017-11-07 DIAGNOSIS — M6281 Muscle weakness (generalized): Secondary | ICD-10-CM

## 2017-11-07 NOTE — Progress Notes (Signed)
Radiation Oncology         [phone]) [phone] _____________________________  Name: Gloria Ware        MRN: [id number]  Date of Service: 11/07/2017 DOB: 10-20-1945  TG:YBWLS, Herbie [place], MD  Nicholas Lose, MD     REFERRING PHYSICIAN: Nicholas Lose, MD   DIAGNOSIS: The primary encounter diagnosis was Malignant neoplasm of upper-inner quadrant of right breast in female, estrogen receptor positive ([place]). Diagnoses of Tremor and Muscle weakness (generalized) were also pertinent to this visit.   HISTORY OF PRESENT ILLNESS: Gloria Ware is a 72 y.o. femaleoriginally seen in multidisciplinary breast clinic for a new diagnosis of right breast cancer. The patient was noted to have a mass seen as well as calcifications in the upper inner quadrant of the right on her screening mammogram. Diagnostic imaging revealed a 1 x .7 x . 7 cm mass at 1:00, as well as calcifications, and an abnormally thickened lymph node. 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   She underwent right mastectomy with sentinel node biopsy on 08/06/17. Final pathology revealed scattered foci of invasive lobular cancer, grade 2, the largest measured 6 mm. She had 2/6 nodes positive and her mammaprint was high risk. She started systemic therapy with Adriamycin, Cytoxan, and had one cycle of taxol but this was also discontinued  due to toxicities. She comes today to discuss the options of adjuvant radiotherapy.   PREVIOUS RADIATION THERAPY: No  PAST MEDICAL HISTORY:  Past Medical History:  Diagnosis Date  . Anxiety   . Breast CA ([place])   . Colon polyp 09/12/2005  . Depression   . Eczema    right forearm   . Family history of breast cancer   . Genetic testing 06/27/2017   Multi-Cancer panel (83 genes) @ [facility] - No pathogenic mutations detected  . Hearing loss   . High cholesterol   . Injury     left foot  metatarsal fracture sustined 08-03-17 , treated with boot; patient  reports  improvement as of today  . Osteopenia 11/2016   T score -2.1 stable from prior DEXA  . PONV (postoperative nausea and vomiting)    post op nausea  . Tinnitus        PAST SURGICAL HISTORY: Past Surgical History:  Procedure Laterality Date  . CARDIAC CATHETERIZATION     husband was having symtpoms and needed a stent; subsequently she states she started " to feel something"  but thinks it was " all in my head bc of my hbsand". subsequently saw same cardiologist as her husband who sent for treadmill stress that had inconcluisicve results so she was sent for cardiac catherization which she states was " definitely negative "   . cataract surg    . DILATION AND CURETTAGE OF UTERUS    . IR CV LINE INJECTION  09/30/2017  . MASTECTOMY W/ SENTINEL NODE BIOPSY Right 08/06/2017   Procedure: RIGHT MASTECTOMY WITH SENTINEL LYMPH NODE BIOPSY;  Surgeon: [name], MD;  Location: [facility];  Service: General;  Laterality: Right;  . MOUTH SURGERY    . PORTACATH PLACEMENT N/A 09/12/2017   Procedure: INSERTION PORT-A-CATH;  Surgeon: [name], MD;  Location: [facility];  Service: General;  Laterality: N/A;  . RADIOACTIVE SEED GUIDED AXILLARY SENTINEL LYMPH NODE Right 08/06/2017   Procedure: SEED TARGETED AXILLARY LYMPH NODE EXCISION;  Surgeon: [name],  MD;  Location: [facility];  Service: General;  Laterality: Right;     FAMILY HISTORY:  Family History  Problem Relation Age of Onset  . Breast cancer Sister 61       currently 50  . Hypertension Maternal Grandmother   . Stroke Maternal Grandmother   . Hypertension Maternal Grandfather   . Stroke Maternal Grandfather   . Breast cancer Cousin        unconfirmed in maternal first cousin  . Ovarian cancer Cousin        unconfirmed if ovarian vs. uterine in paternal first cousin     SOCIAL HISTORY:  reports that  has never smoked. she  has never used smokeless tobacco. She reports that she does not drink alcohol or use drugs. The patient is married and lives in [place]. She is accompanied by her husband.    ALLERGIES: Penicillins; Sulfa antibiotics; Atorvastatin; and Zetia [ezetimibe]   MEDICATIONS:  Current Outpatient Medications  Medication Sig Dispense Refill  . acetaminophen (TYLENOL) 500 MG tablet Take 500 mg by mouth every 6 (six) hours as needed for mild pain, moderate pain or headache.    Marland Kitchen aspirin 81 MG tablet Take 81 mg by mouth daily.      . Calcium Carbonate-Vitamin D (CALTRATE 600+D PO) Take 1 tablet by mouth 2 (two) times daily.    . diazepam (VALIUM) 5 MG tablet Take 1 tablet (5 mg total) by mouth every 12 (twelve) hours as needed for anxiety or muscle spasms. 20 tablet 0  . prochlorperazine (COMPAZINE) 10 MG tablet Take 10 mg by mouth every 6 (six) hours as needed for nausea or vomiting.    . sertraline (ZOLOFT) 100 MG tablet Take 200 mg by mouth daily.     Marland Kitchen oxyCODONE (OXY IR/ROXICODONE) 5 MG immediate release tablet Take 1 tablet (5 mg total) by mouth every 4 (four) hours as needed for severe pain. (Patient not taking: Reported on 11/07/2017) 30 tablet 0   No current facility-administered medications for this encounter.      REVIEW OF SYSTEMS: On review of systems, the patient reports that she is doing poorly. She notes increased generalize weakness but notes that over a year ago she had trouble with lower extremity weakness when standing. She says this was thought to be due to statins. She was apparently seen in the ED two nights ago and given valium. She denies any chest pain, shortness of breath, cough, fevers, chills. She reports that she feels anxious and that sometimes she can control her tremor of her upper extremities but not her lower. No other complaints are verbalized.    PHYSICAL EXAM:  Wt Readings from Last 3 Encounters:  11/07/17 136 lb 6.4 oz (61.9 kg)  10/28/17 136 lb 12.8 oz (62.1 kg)   10/25/17 136 lb 4.8 oz (61.8 kg)   Temp Readings from Last 3 Encounters:  11/07/17 (!) 97.5 F (36.4 C) (Oral)  11/05/17 97.6 F (36.4 C) (Oral)  10/28/17 97.8 F (36.6 C) (Oral)   BP Readings from Last 3 Encounters:  11/07/17 122/60  11/05/17 124/61  10/28/17 (!) 116/57   Pulse Readings from Last 3 Encounters:  11/07/17 (!) 111  11/05/17 95  10/28/17 (!) 107     In general this is a well appearing caucasian female in no acute distress. She is alert and oriented x4 and appropriate throughout the examination. HEENT reveals that the patient is normocephalic, atraumatic. EOMs are intact. PERRLA. Skin is intact without any evidence  of gross lesions. She is anxious but is able to relax somewhat during our discussion and her upper extremities do settle and stop shaking. Her tremor is nonspecific, and continues in her lower extremities. She is uncomfortable and stands and sits frequently but her movement is guarded and slow. She relies on her husband to hold her up while trying to stand. She does not have any gait disturbance that I can tell but she moves her legs very slowly.    ECOG = 0  0 - Asymptomatic (Fully active, able to carry on all predisease activities without restriction)  1 - Symptomatic but completely ambulatory (Restricted in physically strenuous activity but ambulatory and able to carry out work of a light or sedentary nature. For example, light housework, office work)  2 - Symptomatic, <50% in bed during the day (Ambulatory and capable of all self care but unable to carry out any work activities. Up and about more than 50% of waking hours)  3 - Symptomatic, >50% in bed, but not bedbound (Capable of only limited self-care, confined to bed or chair 50% or more of waking hours)  4 - Bedbound (Completely disabled. Cannot carry on any self-care. Totally confined to bed or chair)  5 - Death   Eustace Pen MM, Creech RH, Tormey DC, et al. [phone]). "Toxicity and response criteria of  the [facility]". [place] Oncol. 5 (6): 649-55    LABORATORY DATA:  Lab Results  Component Value Date   WBC 4.3 11/05/2017   HGB 10.2 (L) 11/05/2017   HCT 30.0 (L) 11/05/2017   MCV 87.4 11/05/2017   PLT 260 11/05/2017   Lab Results  Component Value Date   NA 136 11/05/2017   K 3.8 11/05/2017   CL 100 (L) 11/05/2017   CO2 23 10/28/2017   Lab Results  Component Value Date   ALT 24 10/28/2017   AST 26 10/28/2017   ALKPHOS 75 10/28/2017   BILITOT 0.3 10/28/2017      RADIOGRAPHY: No results found.     IMPRESSION/PLAN: 1. Stage IB, pT1bN1M0, grade 2, ER/PR positive invasive lobular carcinoma with pleomorphic changes of the right breast. We reviewed the rationale of adjuvant therapy and reviewed her course thusfar. We reviewed her pathology and discussed the rationale now that chemotherapy has been discontinued to proceed with adjuvant radiotherapy. However her course is that she would benefit from further workup with neurology. We discussed the risks, benefits, short, and long term effects of radiotherapy, and the patient is interested in proceeding. Dr. Lisbeth Renshaw discusses the delivery and logistics of radiotherapy and would anticipate a 6 1/2 week course of treatment. We will coordinate for her to proceed with simulation in about 2 weeks, and anticipate treatment in 3 weeks provided her neurologic health has improved. 2. Generalize weakness and tremor. The patient's had issues with sitting and standing as well as progressive episodes of tremor. Though it's possible that her symptoms could be due to anxiety, we'd like to rule out underlying neurologic concerns that would require treatment. We will plan to proceed with a brain mri as well as urgent referral to neurology.  In a visit lasting 60 minutes, greater than 50% of the time was spent face to face discussing her case and trying to understand the recent course with her tremor and sensitivities to her recent  chemotherapy course, and coordinating the patient's care.  The above documentation reflects my direct findings during this shared patient visit. Please see the separate note by  Dr. Lisbeth Renshaw on this date for the remainder of the patient's plan of care.    Carola Rhine, PAC

## 2017-11-08 ENCOUNTER — Telehealth: Payer: Self-pay | Admitting: General Practice

## 2017-11-08 ENCOUNTER — Other Ambulatory Visit: Payer: Self-pay | Admitting: General Surgery

## 2017-11-08 ENCOUNTER — Telehealth: Payer: Self-pay | Admitting: *Deleted

## 2017-11-08 NOTE — Progress Notes (Signed)
Gloria Ware was seen today in the movement disorders clinic for neurologic consultation at the request of Hayden Pedro,*.  The consultation is for the evaluation of tremor and generalized weakness.  The records that were made available to me were reviewed.  This patient is accompanied in the office by her husband who supplements the history.  The patient is a 72 year old female with a fairly new diagnosis of breast cancer.  The patient underwent right mastectomy with sentinel node biopsy on August 06, 2017.  She started systemic chemotherapy with Adriamycin, Cytoxan and had 1 cycle of Taxol, which was discontinued because of toxicity.  She has not completed further therapy because of tremor and presents today to discuss that.   Tremor: Yes.     How long has it been going on? States that as a young person she would have tremor if she was very stressed she would have episodes of tremor.  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  Husband then says she did have tremor before the mastectomy, during stressful and anxious episodes.  Started a year ago but was only with anxiety.  Started in legs after chemo.  States told Dr. Mariea Clonts about it about a year ago and statins were d/c and it helped some (but sounds like referring some to strength in legs when talking about that)  At rest or with activation?  activation  Fam hx of tremor?  Denies tremor when asked and then when going through fam hx said "my dad may have had parkinsons as he had a tremor."  Located where?  Arms and legs  Affected by caffeine:  No. (1 coffee/day)  Affected by alcohol:  Doesn't drink enough to know  Affected by stress:  Yes.    Affected by fatigue:  Yes.   - husband states that when changed from sleeping in sofa to bed, tremor improved  Spills soup if on spoon:  No. - "I don't have a tremor when I eat."  Spills  glass of liquid if full:  No.  Affects ADL's (tying shoes, brushing teeth, etc):  No.  States that took valium for tremor and it helped but made her sleep.  MRI of the brain was completed on November 09, 2017 with and without gadolinium.  I personally reviewed this.  There was evidence of mild small vessel disease.  Diffusion-weighted imaging was negative.  It was otherwise unremarkable.   ALLERGIES:   Allergies  Allergen Reactions  . Penicillins Other (See Comments)    Throat burning Has patient had a PCN reaction causing immediate rash, facial/tongue/throat swelling, SOB or lightheadedness with hypotension: No Has patient had a PCN reaction causing severe rash involving mucus membranes or skin necrosis: No Has patient had a PCN reaction that required hospitalization: No Has patient had a PCN reaction occurring within the last 10 years: No If all of the above answers are "NO", then may proceed with Cephalosporin use.   . Sulfa Antibiotics Nausea And Vomiting  . Atorvastatin      Muscle aches   . Zetia [Ezetimibe] Other (See Comments)    Muscle aches     CURRENT MEDICATIONS:  Outpatient Encounter Medications as of 11/11/2017  Medication Sig  . aspirin 81 MG tablet Take 81 mg by mouth daily.    . Calcium Carbonate-Vitamin D (CALTRATE 600+D PO) Take 1 tablet  by mouth 2 (two) times daily.  . diazepam (VALIUM) 5 MG tablet Take 1 tablet (5 mg total) by mouth every 12 (twelve) hours as needed for anxiety or muscle spasms.  Marland Kitchen sertraline (ZOLOFT) 100 MG tablet Take 200 mg by mouth daily.   . [DISCONTINUED] acetaminophen (TYLENOL) 500 MG tablet Take 500 mg by mouth every 6 (six) hours as needed for mild pain, moderate pain or headache.  . [DISCONTINUED] oxyCODONE (OXY IR/ROXICODONE) 5 MG immediate release tablet Take 1 tablet (5 mg total) by mouth every 4 (four) hours as needed for severe pain. (Patient not taking: Reported on 11/07/2017)  . [DISCONTINUED] prochlorperazine (COMPAZINE) 10 MG  tablet Take 10 mg by mouth every 6 (six) hours as needed for nausea or vomiting.   No facility-administered encounter medications on file as of 11/11/2017.     PAST MEDICAL HISTORY:   Past Medical History:  Diagnosis Date  . Anxiety   . Breast CA ([place])   . Colon polyp 09/12/2005  . Depression   . Eczema    right forearm   . Family history of breast cancer   . Genetic testing 06/27/2017   Multi-Cancer panel (83 genes) @ [facility] - No pathogenic mutations detected  . Hearing loss   . High cholesterol   . Injury     left foot metatarsal fracture sustined 08-03-17 , treated with boot; patient  reports  improvement as of today  . OCD (obsessive compulsive disorder)   . Osteopenia 11/2016   T score -2.1 stable from prior DEXA  . PONV (postoperative nausea and vomiting)    post op nausea  . Tinnitus     PAST SURGICAL HISTORY:   Past Surgical History:  Procedure Laterality Date  . CARDIAC CATHETERIZATION     husband was having symtpoms and needed a stent; subsequently she states she started " to feel something"  but thinks it was " all in my head bc of my hbsand". subsequently saw same cardiologist as her husband who sent for treadmill stress that had inconcluisicve results so she was sent for cardiac catherization which she states was " definitely negative "   . cataract surg    . DILATION AND CURETTAGE OF UTERUS    . IR CV LINE INJECTION  09/30/2017  . MASTECTOMY W/ SENTINEL NODE BIOPSY Right 08/06/2017   Procedure: RIGHT MASTECTOMY WITH SENTINEL LYMPH NODE BIOPSY;  Surgeon: [name], MD;  Location: [facility];  Service: General;  Laterality: Right;  . MOUTH SURGERY    . PORTACATH PLACEMENT N/A 09/12/2017   Procedure: INSERTION PORT-A-CATH;  Surgeon: [name], MD;  Location: [facility];  Service: General;  Laterality: N/A;  . RADIOACTIVE SEED GUIDED AXILLARY SENTINEL LYMPH NODE Right 08/06/2017   Procedure: SEED TARGETED AXILLARY LYMPH NODE EXCISION;  Surgeon:  [name], MD;  Location: [facility];  Service: General;  Laterality: Right;    SOCIAL HISTORY:   Social History   Socioeconomic History  . Marital status: Married    Spouse name: Not on file  . Number of children: Not on file  . Years of education: Not on file  . Highest education level: Not on file  Social Needs  . Financial resource strain: Not on file  . Food insecurity - worry: Not on file  . Food insecurity - inability: Not on file  . Transportation needs - medical: Not on file  . Transportation needs - non-medical: Not on file  Occupational History  . Not  on file  Tobacco Use  . Smoking status: Never Smoker  . Smokeless tobacco: Never Used  Substance and Sexual Activity  . Alcohol use: No    Alcohol/week: 0.0 oz    Frequency: Never  . Drug use: No  . Sexual activity: Yes    Partners: Male    Birth control/protection: Post-menopausal    Comment: 1st intercourse 57 yo-1 partner  Other Topics Concern  . Not on file  Social History Narrative  . Not on file    FAMILY HISTORY:   Family Status  Relation Name Status  . Sister  Alive  . MGM  (Not Specified)  . MGF  (Not Specified)  . Mother  Alive  . Father  Deceased  . Cousin  (Not Specified)  . Cousin  (Not Specified)  . Son 2 Alive    ROS:  A complete 10 system review of systems was obtained and was unremarkable apart from what is mentioned above.  PHYSICAL EXAMINATION:    VITALS:   Vitals:   11/11/17 1004  BP: 98/60  Pulse: (!) 102  SpO2: 98%  Weight: 131 lb (59.4 kg)  Height: 5\' 1"  (1.549 m)    GEN:  The patient appears stated age and is in NAD.  She is nervous/anxious at the beginning of the visit HEENT:  Normocephalic, atraumatic.  The mucous membranes are moist. The superficial temporal arteries are without ropiness or tenderness. CV:  Tachycardic.  Regular.   Lungs:  CTAB Neck/HEME:  There are no carotid bruits bilaterally.  Neurological examination:  Orientation: The  patient is alert and oriented x3. Fund of knowledge is appropriate.  Recent and remote memory are intact.  Attention and concentration are normal.    Able to name objects and repeat phrases. Cranial nerves: There is good facial symmetry. Pupils are equal round and reactive to light bilaterally. Fundoscopic exam reveals clear margins bilaterally. Extraocular muscles are intact. The visual fields are full to confrontational testing. The speech is fluent and clear. Soft palate rises symmetrically and there is no tongue deviation. Hearing is intact to conversational tone. Sensation: Sensation is intact to light and pinprick throughout (facial, trunk, extremities). Vibration is intact at the bilateral big toe. There is no extinction with double simultaneous stimulation. There is no sensory dermatomal level identified. Motor: Strength is 5/5 in the bilateral upper and lower extremities (requires some encouragement, but ultimately strength is normal).   Shoulder shrug is equal and symmetric.  There is no pronator drift. Deep tendon reflexes: Deep tendon reflexes are 3/4 at the bilateral biceps, triceps, brachioradialis, patella and achilles. Plantar responses are downgoing bilaterally.  Movement examination: Tone: There is normal tone in the bilateral upper extremities.  The tone in the lower extremities is normal.  Abnormal movements: There is leg tremor that completely abates with distraction.  No arm tremor is noted with posture or with intention.  Archimedes spirals are drawn well. Coordination:  There is no decremation with RAM's, with any form of RAMS, including alternating supination and pronation of the forearm, hand opening and closing, finger taps, heel taps and toe taps. Gait and Station: The patient pushes off of the chair.  She asks for the examiner's arm to walk down the hall.  She is somewhat unstable.  No results found for: TSH   Chemistry      Component Value Date/Time   NA 136 11/05/2017  0650   NA 138 09/30/2017 1242   K 3.8 11/05/2017 0650   K 4.0  09/30/2017 1242   CL 100 (L) 11/05/2017 0650   CO2 23 10/28/2017 1310   CO2 23 09/30/2017 1242   BUN 14 11/05/2017 0650   BUN 14.4 09/30/2017 1242   CREATININE 0.70 11/05/2017 0650   CREATININE 1.0 09/30/2017 1242      Component Value Date/Time   CALCIUM 9.2 10/28/2017 1310   CALCIUM 8.9 09/30/2017 1242   ALKPHOS 75 10/28/2017 1310   ALKPHOS 136 09/30/2017 1242   AST 26 10/28/2017 1310   AST 12 09/30/2017 1242   ALT 24 10/28/2017 1310   ALT 13 09/30/2017 1242   BILITOT 0.3 10/28/2017 1310   BILITOT <0.22 09/30/2017 1242       ASSESSMENT/PLAN:  1.  Tremor   -Long discussion with patient today.  I think that most of her tremor is stress and anxiety induced.  She actually agreed with me and asked me for a referral to counseling/psychotherapy before I even mentioned it.  She reported that she has a long history of OCD and went to psychotherapy many years ago and found it helpful.  I gave her a brochure to [facility] so that she can call and make an appointment, but she was also going to ask at the oncology center if resources are available to her.  I did tell her that I cannot completely rule out essential tremor, but felt that diagnosis was probably less likely.  In addition, she really did not want medicine, nor did I think she would tolerate medicine well.  She is certainly not a candidate for a beta-blocker given low blood pressure and she was not excited about primidone.  An anxiolytic may be helpful, but I would certainly leave that up to the counselor if they feel necessary to refer to psychiatry.  She was open to that idea (referral to psychiatry) but was not open to the idea of taking more medication.  -believes that TSH has been checked by Dr. Mariea Clonts.  If not done so, then should check   2.  Hyperreflexia  -I did talk to her about the fact that she could have physiologic hyperreflexia, or it could be  pathologic.  Her MRI of the brain was very unremarkable and I reviewed this with the patient and her husband today.  I told her we could do an MRI of the cervical spine to make sure that we were not missing anything, but ultimately she decided to hold off on that stating that she just could not fathom going back for another MRI machine right now.  I do think continuing with physical therapy would be of value.  I did not see anything focal or lateralizing on her examination today.  3.  F/u prn.  Much greater than 50% of this visit was spent in counseling and coordinating care.  Total face to face time:  60 min  Cc:  [name], MD

## 2017-11-08 NOTE — Telephone Encounter (Signed)
[place] Psychosocial Distress Screening Clinical Social Work  Clinical Social Work was referred by distress screening protocol.  The patient scored a 7 on the Psychosocial Distress Thermometer which indicates moderate distress. Clinical Social Worker Edwyna Shell to assess for distress and other psychosocial needs. Unable to reach patient, left detailed VM requesting call back.  Outlined resources available at [employer], offered to mail information packet if desired.  Provided contact information and encouraged patient to call CSW.    ONCBCN DISTRESS SCREENING 11/07/2017  Screening Type Initial Screening  Distress experienced in past week (1-10) 7  Emotional problem type Depression;Nervousness/Anxiety;Adjusting to appearance changes  Information Concerns Type Lack of info about complementary therapy choices;Lack of info about maintaining fitness  Physical Problem type Pain;Sleep/insomnia;Getting around;Loss of appetitie;Constipation/diarrhea  Physician notified of physical symptoms Yes    Clinical Social Worker follow up needed: No.  Will await call back from patient.   If yes, follow up plan:   Edwyna Shell, LCSW Clinical Social Worker Phone:  [phone]

## 2017-11-08 NOTE — Telephone Encounter (Signed)
CALLED PATIENT TO INFORM OF MRI - ARRIVAL TIME - 11:45 AM - @ [facility], NO RESTRICTIONS TO TEST, SPOKE WITH PATIENT AND SHE IS AWARE OF THIS TEST.

## 2017-11-09 ENCOUNTER — Ambulatory Visit (HOSPITAL_COMMUNITY)
Admission: RE | Admit: 2017-11-09 | Discharge: 2017-11-09 | Disposition: A | Discharge status: Home / Self Care | Payer: Medicare Other | Source: Ambulatory Visit | Attending: Radiation Oncology | Admitting: Radiation Oncology

## 2017-11-09 DIAGNOSIS — Z9841 Cataract extraction status, right eye: Secondary | ICD-10-CM | POA: Insufficient documentation

## 2017-11-09 DIAGNOSIS — Z8673 Personal history of transient ischemic attack (TIA), and cerebral infarction without residual deficits: Secondary | ICD-10-CM | POA: Insufficient documentation

## 2017-11-09 DIAGNOSIS — C50211 Malignant neoplasm of upper-inner quadrant of right female breast: Secondary | ICD-10-CM | POA: Diagnosis not present

## 2017-11-09 DIAGNOSIS — Z170 Estrogen receptor positive status [ER+]: Secondary | ICD-10-CM | POA: Insufficient documentation

## 2017-11-09 DIAGNOSIS — Z9842 Cataract extraction status, left eye: Secondary | ICD-10-CM | POA: Diagnosis not present

## 2017-11-09 DIAGNOSIS — R251 Tremor, unspecified: Secondary | ICD-10-CM | POA: Diagnosis not present

## 2017-11-09 DIAGNOSIS — M6281 Muscle weakness (generalized): Secondary | ICD-10-CM | POA: Diagnosis not present

## 2017-11-09 MED ORDER — GADOBENATE DIMEGLUMINE 529 MG/ML IV SOLN
15.0000 mL | Freq: Once | INTRAVENOUS | Status: AC | PRN
  Administered 2017-11-09: 13 mL via INTRAVENOUS

## 2017-11-11 ENCOUNTER — Other Ambulatory Visit: Payer: Medicare Other

## 2017-11-11 ENCOUNTER — Ambulatory Visit: Payer: Medicare Other | Admitting: Hematology and Oncology

## 2017-11-11 ENCOUNTER — Encounter: Payer: Self-pay | Admitting: Neurology

## 2017-11-11 ENCOUNTER — Telehealth: Payer: Self-pay | Admitting: Radiation Oncology

## 2017-11-11 ENCOUNTER — Ambulatory Visit: Payer: Medicare Other

## 2017-11-11 ENCOUNTER — Ambulatory Visit (INDEPENDENT_AMBULATORY_CARE_PROVIDER_SITE_OTHER): Payer: Medicare Other | Admitting: Neurology

## 2017-11-11 VITALS — BP 98/60 | HR 102 | Ht 61.0 in | Wt 131.0 lb

## 2017-11-11 DIAGNOSIS — R251 Tremor, unspecified: Secondary | ICD-10-CM

## 2017-11-11 DIAGNOSIS — R292 Abnormal reflex: Secondary | ICD-10-CM

## 2017-11-11 DIAGNOSIS — F411 Generalized anxiety disorder: Secondary | ICD-10-CM

## 2017-11-11 DIAGNOSIS — F429 Obsessive-compulsive disorder, unspecified: Secondary | ICD-10-CM

## 2017-11-11 NOTE — Progress Notes (Signed)
MRI was negative for acute changes! Thanks Bryson Ha

## 2017-11-11 NOTE — Telephone Encounter (Signed)
LM for pt regarding MRI showing no metastatic appearing changes in brain. She will see Dr. Carles Collet today as well.

## 2017-11-11 NOTE — Telephone Encounter (Addendum)
The patient called back and we discussed her brain MRI. She is on her way to see Dr. Carles Collet

## 2017-11-12 ENCOUNTER — Ambulatory Visit: Payer: Medicare Other | Attending: General Surgery | Admitting: Physical Therapy

## 2017-11-12 DIAGNOSIS — R293 Abnormal posture: Secondary | ICD-10-CM | POA: Insufficient documentation

## 2017-11-12 DIAGNOSIS — M6281 Muscle weakness (generalized): Secondary | ICD-10-CM | POA: Diagnosis not present

## 2017-11-12 DIAGNOSIS — R262 Difficulty in walking, not elsewhere classified: Secondary | ICD-10-CM | POA: Insufficient documentation

## 2017-11-12 DIAGNOSIS — M25611 Stiffness of right shoulder, not elsewhere classified: Secondary | ICD-10-CM | POA: Insufficient documentation

## 2017-11-12 NOTE — Therapy (Signed)
[place], [place], [address] Phone: [phone]   Fax:  [phone]  Physical Therapy Treatment  Patient Details  Name: Gloria Ware MRN: [id number] Date of Birth: 11-19-1945 Referring Provider: Dr. [name]   Encounter Date: 11/12/2017  PT End of Session - 11/12/17 1756    Visit Number  10    Number of Visits  21    Date for PT Re-Evaluation  12/03/17    PT Start Time  1350    PT Stop Time  1432    PT Time Calculation (min)  42 min    Activity Tolerance  Patient tolerated treatment well    Behavior During Therapy  [facility] for tasks assessed/performed       Past Medical History:  Diagnosis Date  . Anxiety   . Breast CA ([place])   . Colon polyp 09/12/2005  . Depression   . Eczema    right forearm   . Family history of breast cancer   . Genetic testing 06/27/2017   Multi-Cancer panel (83 genes) @ [facility] - No pathogenic mutations detected  . Hearing loss   . High cholesterol   . Injury     left foot metatarsal fracture sustined 08-03-17 , treated with boot; patient  reports  improvement as of today  . OCD (obsessive compulsive disorder)   . Osteopenia 11/2016   T score -2.1 stable from prior DEXA  . PONV (postoperative nausea and vomiting)    post op nausea  . Tinnitus     Past Surgical History:  Procedure Laterality Date  . CARDIAC CATHETERIZATION     husband was having symtpoms and needed a stent; subsequently she states she started " to feel something"  but thinks it was " all in my head bc of my hbsand". subsequently saw same cardiologist as her husband who sent for treadmill stress that had inconcluisicve results so she was sent for cardiac catherization which she states was " definitely negative "   . cataract surg    . DILATION AND CURETTAGE OF UTERUS    . IR CV LINE INJECTION  09/30/2017  . MASTECTOMY W/ SENTINEL NODE BIOPSY Right 08/06/2017   Procedure: RIGHT MASTECTOMY WITH  SENTINEL LYMPH NODE BIOPSY;  Surgeon: [name], MD;  Location: [place];  Service: General;  Laterality: Right;  . MOUTH SURGERY    . PORTACATH PLACEMENT N/A 09/12/2017   Procedure: INSERTION PORT-A-CATH;  Surgeon: [name], MD;  [facility];  Service: General;  Laterality: N/A;  . RADIOACTIVE SEED GUIDED AXILLARY SENTINEL LYMPH NODE Right 08/06/2017   Procedure: SEED TARGETED AXILLARY LYMPH NODE EXCISION;  Surgeon: [name], MD;  Location: [place];  Service: General;  Laterality: Right;    There were no vitals filed for this visit.  Subjective Assessment - 11/12/17 1353    Subjective  Had a brain MRI on Saturday and met with the neurologist, Dr. Carles Collet, yesterday.  She was told her brain looks good, that it is the brain of a 72 year-old.  She said there is no medication for the tremors. She is planning to schedule some counseling for the anxiety. Met with Dr. Lisbeth Renshaw but doesn't yet have a start date for radiation. My legs feel heavy all the time.  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 It measured 1 cm and is located in the upper inner quadrant. 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 She underwent a right mastectomy and sentinel node biopsy on 08/06/17. She is wearing a boot on her left foot due to a fall and foot fracture 08/04/17. Her orthopedist for her foot is Dr. Doran Durand.     Currently in Pain?  No/denies but legs always feel heavy                      [facility] Adult PT Treatment/Exercise - 11/12/17 0001      Self-Care   Self-Care  Other Self-Care Comments    Other Self-Care Comments   Encouraged patient to consider using an assistive device temporarily until she is more steady on her feet; discussed rollator walker  features      Knee/Hip Exercises: Aerobic   Stationary Bike  No resistance x7 mins      Knee/Hip Exercises: Standing   Other Standing Knee Exercises  both hands in therapist's hands: step to right and do mini squat, step to left and do mini-squat, 5x each side      Knee/Hip Exercises: Seated   Long Arc Quad  Strengthening;Right;Left;20 reps;Weights    Long Arc Quad Weight  1 lbs.    Ball Squeeze  20x with 5 sec holds    Other Seated Knee/Hip Exercises  Gluteal squeezes 20x    Marching  Strengthening;Right;Left;20 reps;Weights    Marching Weights  1 lbs.    Sit to Sand  5 reps;with UE support with both hands in therapists hands                   Breast Clinic Goals - 06/26/17 2119      Patient will be able to verbalize understanding of pertinent lymphedema risk reduction practices relevant to her diagnosis specifically related to skin care.   Time  1    Period  Days    Status  Achieved      Patient will be able to return demonstrate and/or verbalize understanding of the post-op home exercise program related to regaining shoulder range of motion.   Time  1    Period  Days    Status  Achieved      Patient will be able to verbalize understanding of the importance of attending the postoperative After Breast Cancer Class for further lymphedema risk reduction education and therapeutic exercise.   Time  1    Period  Days    Status  Achieved       Long Term Clinic Goals - 11/01/17 1025      CC Long Term Goal  #1   Title  Patient will perform home exercise program correctly and independently.    Time  8    Period  Weeks    Status  On-going      CC Long Term Goal  #2   Title  Patient will demonstrate right shoulder active flexion at >/= 130 degrees for increased ease reaching overhead.    Baseline  111 degrees today, 11/01/17- 125 degrees    Time  8    Period  Weeks    Status  On-going      CC Long Term Goal  #3   Title  Patient will demonstrate right shoulder  active abduction at >/= 137 degrees for increased ease reaching overhead.  Baseline  125 degrees today, 11/01/17- 121 degrees     Time  8    Period  Weeks    Status  On-going      CC Long Term Goal  #4   Title  Patient will report she is able to participate in a regular walking program for 30 minutes, 5-6 days per week for reducing recurrence risk and better tolerance of chemotherapy.    Baseline  Unable to walk long distance with boot on., 11/01/17- pt has been unable to walk due to shakiness    Time  8    Period  Weeks    Status  On-going      CC Long Term Goal  #5   Title  Patient will verbalize understanding of risk reduction practices related to lymphedema.    Baseline  11/01/17- pt required some cueing for this    Time  8    Period  Weeks    Status  On-going      CC Long Term Goal  #6   Title  Quick DASH score reduced to </= 4 for improved function of right arm.    Time  8    Period  Weeks    Status  On-going         Plan - 11/12/17 1757    Clinical Impression Statement  Pt. felt like stationary biking at last session felt good, so repeated this today.  She felt the exercise she did today was challenging and beneficial.      Rehab Potential  Excellent    Clinical Impairments Affecting Rehab Potential  Recently healed Lt foot fracture; started chemo 09/16/17 but has now switched to Taxol due to poor tolerance/decreased WBC=pt very weak; pt no longer receiving chemo    PT Frequency  2x / week    PT Duration  8 weeks    PT Treatment/Interventions  Therapeutic exercise;Patient/family education;Balance training;Neuromuscular re-education;Gait training;ADLs/Self Care Home Management;Therapeutic activities;Functional mobility training;Manual techniques;Passive range of motion;Scar mobilization    PT Next Visit Plan  Cont Nustep or bike as able.  Suggest more emphasis on functional/body weight strengthening such as sit to/from stand, step-ups, sidesteps, etc.; review HEP prn and LE  strength and balance activities, encouraging pt throughout session.  Continue to encourage patient to consider an assistive device for stability.    PT Home Exercise Plan  Seated LE strength for now    Consulted and Agree with Plan of Care  Patient       Patient will benefit from skilled therapeutic intervention in order to improve the following deficits and impairments:  Postural dysfunction, Decreased knowledge of precautions, Pain, Impaired UE functional use, Decreased range of motion, Difficulty walking, Decreased activity tolerance, Decreased strength  Visit Diagnosis: Muscle weakness (generalized)  Difficulty in walking, not elsewhere classified     Problem List Patient Active Problem List   Diagnosis Date Noted  . Insomnia 10/07/2017  . Dehydration 10/07/2017  . Port-A-Cath in place 09/30/2017  . Breast cancer metastasized to multiple sites, right ([place]) 08/06/2017  . Genetic testing 06/27/2017  . Family history of breast cancer   . Breast CA ([place])   . Malignant neoplasm of upper-inner quadrant of right breast in female, estrogen receptor positive ([place]) 06/26/2017    Axel Meas 11/12/2017, [place], [place], [address] Phone: [phone]   Fax:  [phone]  Name: Gloria Ware MRN: [id number] Date of Birth: 1946-07-28  Serafina Royals,  PT 11/12/17 6:01 PM  

## 2017-11-14 ENCOUNTER — Telehealth: Payer: Self-pay | Admitting: Radiation Oncology

## 2017-11-14 ENCOUNTER — Telehealth: Payer: Self-pay | Admitting: General Practice

## 2017-11-14 NOTE — Telephone Encounter (Signed)
Received voicemail message from patient requesting return call. Phoned patient back. Patient reports she is in need of a Social worker. Provided her with Edwyna Shell and Lattie Haw [place]'s contact information. Patient very appreciative for the information and return call.

## 2017-11-14 NOTE — Telephone Encounter (Signed)
Gloria Ware CSW Progress Note  Patient called, wants counseling for anxiety.  States anxiety has been barrier to continuing w cancer treatment.  Has been referred to [facility] but wait time to see therapist is two months, does not want to wait that long.  Explored patient's needs/concerns - as she wants more long term therapy for anxiety management, prefers to be referred to community therapist.  Linked patient w [name] LCSW [phone]).  With patient's permission, called therapist and provided brief background information and said that patient will be calling to schedule appointment.  Therapist willing to take patient, can see her next week.  Will be relocating practice in 4 months, will discuss options w patient.  CSW will follow up w patient next week to ensure continuity of care for anxiety management.  CSW will also explore option of referral to [employer] for additional community support.    Edwyna Shell, LCSW Clinical Social Worker Phone:  [phone]

## 2017-11-15 ENCOUNTER — Encounter: Payer: Self-pay | Admitting: Physical Therapy

## 2017-11-15 ENCOUNTER — Ambulatory Visit: Payer: Medicare Other | Admitting: Physical Therapy

## 2017-11-15 DIAGNOSIS — M6281 Muscle weakness (generalized): Secondary | ICD-10-CM | POA: Diagnosis not present

## 2017-11-15 DIAGNOSIS — R262 Difficulty in walking, not elsewhere classified: Secondary | ICD-10-CM

## 2017-11-15 DIAGNOSIS — M25611 Stiffness of right shoulder, not elsewhere classified: Secondary | ICD-10-CM | POA: Diagnosis not present

## 2017-11-15 DIAGNOSIS — R293 Abnormal posture: Secondary | ICD-10-CM | POA: Diagnosis not present

## 2017-11-15 NOTE — Therapy (Signed)
[place], [place], [address] Phone: [phone]   Fax:  [phone]  Physical Therapy Treatment  Patient Details  Name: Gloria Ware MRN: [id number] Date of Birth: 24-Sep-1946 Referring Provider: Dr. [name]   Encounter Date: 11/15/2017  PT End of Session - 11/15/17 1013    Visit Number  11    Number of Visits  21    Date for PT Re-Evaluation  12/03/17    PT Start Time  5809    PT Stop Time  1011 stopped session early due to fatigue    PT Time Calculation (min)  37 min    Activity Tolerance  Patient tolerated treatment well;Patient limited by fatigue    Behavior During Therapy  [facility] for tasks assessed/performed       Past Medical History:  Diagnosis Date  . Anxiety   . Breast CA ([place])   . Colon polyp 09/12/2005  . Depression   . Eczema    right forearm   . Family history of breast cancer   . Genetic testing 06/27/2017   Multi-Cancer panel (83 genes) @ [facility] - No pathogenic mutations detected  . Hearing loss   . High cholesterol   . Injury     left foot metatarsal fracture sustined 08-03-17 , treated with boot; patient  reports  improvement as of today  . OCD (obsessive compulsive disorder)   . Osteopenia 11/2016   T score -2.1 stable from prior DEXA  . PONV (postoperative nausea and vomiting)    post op nausea  . Tinnitus     Past Surgical History:  Procedure Laterality Date  . CARDIAC CATHETERIZATION     husband was having symtpoms and needed a stent; subsequently she states she started " to feel something"  but thinks it was " all in my head bc of my hbsand". subsequently saw same cardiologist as her husband who sent for treadmill stress that had inconcluisicve results so she was sent for cardiac catherization which she states was " definitely negative "   . cataract surg    . DILATION AND CURETTAGE OF UTERUS    . IR CV LINE INJECTION  09/30/2017  . MASTECTOMY W/ SENTINEL  NODE BIOPSY Right 08/06/2017   Procedure: RIGHT MASTECTOMY WITH SENTINEL LYMPH NODE BIOPSY;  Surgeon: [name], MD;  Location: [facility];  Service: General;  Laterality: Right;  . MOUTH SURGERY    . PORTACATH PLACEMENT N/A 09/12/2017   Procedure: INSERTION PORT-A-CATH;  Surgeon: [name], MD;  [facility];  Service: General;  Laterality: N/A;  . RADIOACTIVE SEED GUIDED AXILLARY SENTINEL LYMPH NODE Right 08/06/2017   Procedure: SEED TARGETED AXILLARY LYMPH NODE EXCISION;  Surgeon: [name], MD;  Location: [place];  Service: General;  Laterality: Right;    There were no vitals filed for this visit.  Subjective Assessment - 11/15/17 0936    Subjective  The night before last I had a bad night and was very shaky. I think that night I did not eat well and did not drink enough water. Yesterday I made a point to drink more water and eat more protein and I think it helped.     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 It measured 1 cm and is located in the upper inner quadrant. She also has  a fairly large lipoma present on her right superior shoulder. She underwent a right mastectomy and sentinel node biopsy on 08/06/17. She is wearing a boot on her left foot due to a fall and foot fracture 08/04/17. Her orthopedist for her foot is Dr. Doran Durand.     Patient Stated Goals  Make sure my arm is ok and that I'm strong enough for chemotherapy.    Currently in Pain?  No/denies    Pain Score  0-No pain                      [facility] Adult PT Treatment/Exercise - 11/15/17 0001      Self-Care   Self-Care  Other Self-Care Comments    Other Self-Care Comments   Continued to encourage patient to consider using an assistive device temporarily until she is more steady on her feet; discussed rollator walker features       Knee/Hip Exercises: Aerobic   Stationary Bike  No resistance x7 mins      Knee/Hip Exercises: Seated   Long Arc Quad  Strengthening;Right;Left;20 reps;Weights    Long Arc Quad Weight  1 lbs.    Ball Squeeze  20x with 5 sec holds    Other Seated Knee/Hip Exercises  Gluteal squeezes 20x    Marching  Strengthening;Right;Left;20 reps;Weights    Marching Weights  1 lbs.                   Breast Clinic Goals - 06/26/17 2119      Patient will be able to verbalize understanding of pertinent lymphedema risk reduction practices relevant to her diagnosis specifically related to skin care.   Time  1    Period  Days    Status  Achieved      Patient will be able to return demonstrate and/or verbalize understanding of the post-op home exercise program related to regaining shoulder range of motion.   Time  1    Period  Days    Status  Achieved      Patient will be able to verbalize understanding of the importance of attending the postoperative After Breast Cancer Class for further lymphedema risk reduction education and therapeutic exercise.   Time  1    Period  Days    Status  Achieved       Long Term Clinic Goals - 11/01/17 1025      CC Long Term Goal  #1   Title  Patient will perform home exercise program correctly and independently.    Time  8    Period  Weeks    Status  On-going      CC Long Term Goal  #2   Title  Patient will demonstrate right shoulder active flexion at >/= 130 degrees for increased ease reaching overhead.    Baseline  111 degrees today, 11/01/17- 125 degrees    Time  8    Period  Weeks    Status  On-going      CC Long Term Goal  #3   Title  Patient will demonstrate right shoulder active abduction at >/= 137 degrees for increased ease reaching overhead.    Baseline  125 degrees today, 11/01/17- 121 degrees     Time  8    Period  Weeks    Status  On-going      CC Long Term Goal  #4   Title  Patient will report she is able to participate in a  regular walking program for 30 minutes, 5-6 days per week for reducing recurrence risk and better tolerance of chemotherapy.    Baseline  Unable to walk long distance with boot on., 11/01/17- pt has been unable to walk due to shakiness    Time  8    Period  Weeks    Status  On-going      CC Long Term Goal  #5   Title  Patient will verbalize understanding of risk reduction practices related to lymphedema.    Baseline  11/01/17- pt required some cueing for this    Time  8    Period  Weeks    Status  On-going      CC Long Term Goal  #6   Title  Quick DASH score reduced to </= 4 for improved function of right arm.    Time  8    Period  Weeks    Status  On-going         Plan - 11/15/17 1014    Clinical Impression Statement  Pt continues to feel improved after using stationary bike at begining of session. She states mornings are harder for her than afternoons and she is more fatigued today. She only required one seated recovery period today. Her gait when she arrived was characterized by shuffling but this improved by end of session. She did have to stop session a little early today due to fatigue.     Rehab Potential  Excellent    Clinical Impairments Affecting Rehab Potential  Recently healed Lt foot fracture; started chemo 09/16/17 but has now switched to Taxol due to poor tolerance/decreased WBC=pt very weak; pt no longer receiving chemo    PT Frequency  2x / week    PT Duration  8 weeks    PT Treatment/Interventions  Therapeutic exercise;Patient/family education;Balance training;Neuromuscular re-education;Gait training;ADLs/Self Care Home Management;Therapeutic activities;Functional mobility training;Manual techniques;Passive range of motion;Scar mobilization    PT Next Visit Plan  Cont Nustep or bike as able.  Suggest more emphasis on functional/body weight strengthening such as sit to/from stand, step-ups, sidesteps, etc.; review HEP prn and LE strength and balance activities,  encouraging pt throughout session.  Continue to encourage patient to consider an assistive device for stability.    PT Home Exercise Plan  Seated LE strength for now    Consulted and Agree with Plan of Care  Patient       Patient will benefit from skilled therapeutic intervention in order to improve the following deficits and impairments:  Postural dysfunction, Decreased knowledge of precautions, Pain, Impaired UE functional use, Decreased range of motion, Difficulty walking, Decreased activity tolerance, Decreased strength  Visit Diagnosis: Muscle weakness (generalized)  Difficulty in walking, not elsewhere classified     Problem List Patient Active Problem List   Diagnosis Date Noted  . Insomnia 10/07/2017  . Dehydration 10/07/2017  . Port-A-Cath in place 09/30/2017  . Breast cancer metastasized to multiple sites, right ([place]) 08/06/2017  . Genetic testing 06/27/2017  . Family history of breast cancer   . Breast CA ([place])   . Malignant neoplasm of upper-inner quadrant of right breast in female, estrogen receptor positive ([place]) 06/26/2017    Allyson Sabal [facility] 11/15/2017, 10:16 AM  [place], [place], [address] Phone: [phone]   Fax:  [phone]  Name: Gloria Ware MRN: [id number] Date of Birth: 1946-07-03  Manus Gunning, PT 11/15/17 10:16 AM

## 2017-11-15 NOTE — Telephone Encounter (Signed)
I asked Gloria Ware to find out about social work options earlier this week. Did that happen?

## 2017-11-18 ENCOUNTER — Other Ambulatory Visit: Payer: Self-pay

## 2017-11-18 ENCOUNTER — Ambulatory Visit: Payer: Medicare Other | Admitting: Physical Therapy

## 2017-11-18 ENCOUNTER — Telehealth: Payer: Self-pay

## 2017-11-18 ENCOUNTER — Other Ambulatory Visit: Payer: Medicare Other

## 2017-11-18 ENCOUNTER — Encounter: Payer: Self-pay | Admitting: Physical Therapy

## 2017-11-18 ENCOUNTER — Ambulatory Visit: Payer: Medicare Other

## 2017-11-18 DIAGNOSIS — M25611 Stiffness of right shoulder, not elsewhere classified: Secondary | ICD-10-CM | POA: Diagnosis not present

## 2017-11-18 DIAGNOSIS — C50211 Malignant neoplasm of upper-inner quadrant of right female breast: Secondary | ICD-10-CM

## 2017-11-18 DIAGNOSIS — R262 Difficulty in walking, not elsewhere classified: Secondary | ICD-10-CM

## 2017-11-18 DIAGNOSIS — M6281 Muscle weakness (generalized): Secondary | ICD-10-CM | POA: Diagnosis not present

## 2017-11-18 DIAGNOSIS — R293 Abnormal posture: Secondary | ICD-10-CM | POA: Diagnosis not present

## 2017-11-18 DIAGNOSIS — Z170 Estrogen receptor positive status [ER+]: Principal | ICD-10-CM

## 2017-11-18 NOTE — Telephone Encounter (Signed)
Orders placed for walker.

## 2017-11-18 NOTE — Therapy (Signed)
[place], [place], [address] Phone: [phone]   Fax:  [phone]  Physical Therapy Treatment  Patient Details  Name: Gloria Ware MRN: [id number] Date of Birth: 18-Feb-1946 Referring Provider: Dr. [name]   Encounter Date: 11/18/2017  PT End of Session - 11/18/17 1554    Visit Number  12    Number of Visits  21    Date for PT Re-Evaluation  12/03/17    PT Start Time  0737    PT Stop Time  1435    PT Time Calculation (min)  41 min    Activity Tolerance  Patient tolerated treatment well    Behavior During Therapy  [facility] for tasks assessed/performed       Past Medical History:  Diagnosis Date  . Anxiety   . Breast CA ([place])   . Colon polyp 09/12/2005  . Depression   . Eczema    right forearm   . Family history of breast cancer   . Genetic testing 06/27/2017   Multi-Cancer panel (83 genes) @ [facility] - No pathogenic mutations detected  . Hearing loss   . High cholesterol   . Injury     left foot metatarsal fracture sustined 08-03-17 , treated with boot; patient  reports  improvement as of today  . OCD (obsessive compulsive disorder)   . Osteopenia 11/2016   T score -2.1 stable from prior DEXA  . PONV (postoperative nausea and vomiting)    post op nausea  . Tinnitus     Past Surgical History:  Procedure Laterality Date  . CARDIAC CATHETERIZATION     husband was having symtpoms and needed a stent; subsequently she states she started " to feel something"  but thinks it was " all in my head bc of my hbsand". subsequently saw same cardiologist as her husband who sent for treadmill stress that had inconcluisicve results so she was sent for cardiac catherization which she states was " definitely negative "   . cataract surg    . DILATION AND CURETTAGE OF UTERUS    . IR CV LINE INJECTION  09/30/2017  . MASTECTOMY W/ SENTINEL NODE BIOPSY Right 08/06/2017   Procedure: RIGHT MASTECTOMY WITH  SENTINEL LYMPH NODE BIOPSY;  Surgeon: [name], MD;  Location: [place];  Service: General;  Laterality: Right;  . MOUTH SURGERY    . PORTACATH PLACEMENT N/A 09/12/2017   Procedure: INSERTION PORT-A-CATH;  Surgeon: [name], MD;  [facility];  Service: General;  Laterality: N/A;  . RADIOACTIVE SEED GUIDED AXILLARY SENTINEL LYMPH NODE Right 08/06/2017   Procedure: SEED TARGETED AXILLARY LYMPH NODE EXCISION;  Surgeon: [name], MD;  Location: [place];  Service: General;  Laterality: Right;    There were no vitals filed for this visit.  Subjective Assessment - 11/18/17 1359    Subjective  I am very shaky today.     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 It measured 1 cm and is located in the upper inner quadrant. 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 She underwent a right mastectomy and sentinel node biopsy on 08/06/17. She is wearing a boot on her left foot due to a fall and foot fracture 08/04/17. Her orthopedist for her foot is Dr. Doran Durand.  Patient Stated Goals  Make sure my arm is ok and that I'm strong enough for chemotherapy.    Currently in Pain?  Yes    Pain Score  5     Pain Location  Hip    Pain Orientation  Left    Pain Descriptors / Indicators  Discomfort                      [facility] Adult PT Treatment/Exercise - 11/18/17 0001      Knee/Hip Exercises: Aerobic   Stationary Bike  No resistance x10 mins      Knee/Hip Exercises: Seated   Long Arc Quad  Strengthening;Right;Left;20 reps;Weights    Long Arc Quad Weight  1 lbs.    Ball Squeeze  20x with 5 sec holds    Other Seated Knee/Hip Exercises  Gluteal squeezes 20x    Marching  Strengthening;Right;Left;20 reps;Weights    Marching Weights  1 lbs.    Sit to Sand  5 reps;with UE support;without  UE support pt required both hands for 2 reps then 1 hand for 3 reps                   Breast Clinic Goals - 06/26/17 2119      Patient will be able to verbalize understanding of pertinent lymphedema risk reduction practices relevant to her diagnosis specifically related to skin care.   Time  1    Period  Days    Status  Achieved      Patient will be able to return demonstrate and/or verbalize understanding of the post-op home exercise program related to regaining shoulder range of motion.   Time  1    Period  Days    Status  Achieved      Patient will be able to verbalize understanding of the importance of attending the postoperative After Breast Cancer Class for further lymphedema risk reduction education and therapeutic exercise.   Time  1    Period  Days    Status  Achieved       Long Term Clinic Goals - 11/01/17 1025      CC Long Term Goal  #1   Title  Patient will perform home exercise program correctly and independently.    Time  8    Period  Weeks    Status  On-going      CC Long Term Goal  #2   Title  Patient will demonstrate right shoulder active flexion at >/= 130 degrees for increased ease reaching overhead.    Baseline  111 degrees today, 11/01/17- 125 degrees    Time  8    Period  Weeks    Status  On-going      CC Long Term Goal  #3   Title  Patient will demonstrate right shoulder active abduction at >/= 137 degrees for increased ease reaching overhead.    Baseline  125 degrees today, 11/01/17- 121 degrees     Time  8    Period  Weeks    Status  On-going      CC Long Term Goal  #4   Title  Patient will report she is able to participate in a regular walking program for 30 minutes, 5-6 days per week for reducing recurrence risk and better tolerance of chemotherapy.    Baseline  Unable to walk long distance with boot on., 11/01/17- pt has been unable to walk due to shakiness    Time  8    Period  Weeks    Status  On-going      CC Long Term Goal   #5   Title  Patient will verbalize understanding of risk reduction practices related to lymphedema.    Baseline  11/01/17- pt required some cueing for this    Time  8    Period  Weeks    Status  On-going      CC Long Term Goal  #6   Title  Quick DASH score reduced to </= 4 for improved function of right arm.    Time  8    Period  Weeks    Status  On-going         Plan - 11/18/17 1558    Clinical Impression Statement  Pt was able to tolerate increased time on the stationary bike today to help increase endurance and decrease fatigue. She was shaky again today and required assist for sit to stand from chair in waiting room and held on to therapist arm while walking down hallway for balance. By end of session, pt was able to ambulate with normalized gait pattern including step through gait pattern and increased step length. She feels she is more fatigued today secondary to not sleeping well at night due to the tremors and inability to get comfortable. Pt was able to stand from chair at end of session with use of 1 UE with encouragement. Educated pt to continue this at home and rely more on her legs to stand instead of her arms.     Rehab Potential  Excellent    Clinical Impairments Affecting Rehab Potential  Recently healed Lt foot fracture; started chemo 09/16/17 but has now switched to Taxol due to poor tolerance/decreased WBC=pt very weak; pt no longer receiving chemo    PT Frequency  2x / week    PT Duration  8 weeks    PT Treatment/Interventions  Therapeutic exercise;Patient/family education;Balance training;Neuromuscular re-education;Gait training;ADLs/Self Care Home Management;Therapeutic activities;Functional mobility training;Manual techniques;Passive range of motion;Scar mobilization    PT Next Visit Plan  Cont Nustep or bike as able.  Suggest more emphasis on functional/body weight strengthening such as sit to/from stand, step-ups, sidesteps, etc.; review HEP prn and LE strength and  balance activities, encouraging pt throughout session.  Continue to encourage patient to consider an assistive device for stability.    PT Home Exercise Plan  Seated LE strength for now    Consulted and Agree with Plan of Care  Patient       Patient will benefit from skilled therapeutic intervention in order to improve the following deficits and impairments:  Postural dysfunction, Decreased knowledge of precautions, Pain, Impaired UE functional use, Decreased range of motion, Difficulty walking, Decreased activity tolerance, Decreased strength  Visit Diagnosis: Muscle weakness (generalized)  Difficulty in walking, not elsewhere classified     Problem List Patient Active Problem List   Diagnosis Date Noted  . Insomnia 10/07/2017  . Dehydration 10/07/2017  . Port-A-Cath in place 09/30/2017  . Breast cancer metastasized to multiple sites, right ([place]) 08/06/2017  . Genetic testing 06/27/2017  . Family history of breast cancer   . Breast CA ([place])   . Malignant neoplasm of upper-inner quadrant of right breast in female, estrogen receptor positive ([place]) 06/26/2017    Allyson Sabal [facility] 11/18/2017, [place], [place], [address] Phone: [phone]   Fax:  [phone]  Name: Gloria Ware MRN: [id number]  Date of Birth: [birth year]  Allyson Sabal [place], [place] 11/18/17 4:15 PM

## 2017-11-19 ENCOUNTER — Telehealth: Payer: Self-pay

## 2017-11-19 DIAGNOSIS — F411 Generalized anxiety disorder: Secondary | ICD-10-CM | POA: Diagnosis not present

## 2017-11-19 NOTE — Telephone Encounter (Signed)
Left VM for Santiago Glad with [facility] for rolling walker for pt.  Cyndia Bent RN

## 2017-11-20 ENCOUNTER — Telehealth: Payer: Self-pay | Admitting: Hematology and Oncology

## 2017-11-20 ENCOUNTER — Encounter: Payer: Medicare Other | Admitting: Physical Therapy

## 2017-11-20 ENCOUNTER — Ambulatory Visit
Admission: RE | Admit: 2017-11-20 | Discharge: 2017-11-20 | Disposition: A | Discharge status: Home / Self Care | Payer: Medicare Other | Source: Ambulatory Visit | Attending: Radiation Oncology | Admitting: Radiation Oncology

## 2017-11-20 ENCOUNTER — Telehealth: Payer: Self-pay

## 2017-11-20 DIAGNOSIS — Z170 Estrogen receptor positive status [ER+]: Secondary | ICD-10-CM | POA: Diagnosis not present

## 2017-11-20 DIAGNOSIS — Z510 Encounter for antineoplastic radiation therapy: Secondary | ICD-10-CM | POA: Diagnosis not present

## 2017-11-20 DIAGNOSIS — C50211 Malignant neoplasm of upper-inner quadrant of right female breast: Secondary | ICD-10-CM | POA: Diagnosis not present

## 2017-11-20 NOTE — Telephone Encounter (Signed)
Left message again for patient regarding getting a walker for her.  Cyndia Bent RN

## 2017-11-21 NOTE — Progress Notes (Signed)
  Radiation Oncology         [phone] ________________________________  Name: Gloria Ware MRN: [id number]  Date: 11/20/2017  DOB: 05/07/1946  Optical Surface Tracking Plan:  Since intensity modulated radiotherapy (IMRT) and 3D conformal radiation treatment methods are predicated on accurate and precise positioning for treatment, intrafraction motion monitoring is medically necessary to ensure accurate and safe treatment delivery.  The ability to quantify intrafraction motion without excessive ionizing radiation dose can only be performed with optical surface tracking. Accordingly, surface imaging offers the opportunity to obtain 3D measurements of patient position throughout IMRT and 3D treatments without excessive radiation exposure.  I am ordering optical surface tracking for this patient's upcoming course of radiotherapy. ________________________________  Kyung Rudd, MD 11/21/2017 8:57 AM    Reference:   Ursula Alert, J, et al. Surface imaging-based analysis of intrafraction motion for breast radiotherapy patients.Journal of [place], n. 6, nov. 2014. ISSN [id number].   Available at: <http://www.jacmp.org/index.php/jacmp/article/view/4957>.

## 2017-11-21 NOTE — Progress Notes (Signed)
  Radiation Oncology         [phone] ________________________________  Name: Gloria Ware MRN: [id number]  Date: 11/20/2017  DOB: March 22, 1946  DIAGNOSIS:     ICD-10-CM   1. Malignant neoplasm of upper-inner quadrant of right breast in female, estrogen receptor positive ([place]) C50.211    Z17.0      SIMULATION AND TREATMENT PLANNING NOTE  The patient presented for simulation prior to beginning her course of radiation treatment for her diagnosis of right-sided breast cancer. The patient was placed in a supine position on a breast board. A customized vac-lock bag was also constructed and this complex treatment device will be used on a daily basis during her treatment. In this fashion, a CT scan was obtained through the chest area and an isocenter was placed near the chest wall at the upper aspect of the right chest.  The patient will be planned to receive a course of radiation initially to a dose of 50.4 gray. This will consist of a 4 field technique targeting the right chest wall as well as the supraclavicular region. Therefore 2 customized medial and lateral tangent fields have been created targeting the chest wall, and also 2 additional customized fields have been designed to treat the supraclavicular region both with a right supraclavicular field and a right posterior axillary boost field. A forward planning/reduced field technique will also be evaluated to determine if this significantly improves the dose homogeneity of the overall plan. Therefore, additional customized blocks/fields may be necessary.  This initial treatment will be accomplished at 1.8 gray per fraction.   The initial plan will consist of a 3-D conformal technique. The target volume/scar, heart and lungs have been contoured and dose volume histograms of each of these structures will be evaluated as part of the 3-D conformal treatment planning process.   It is anticipated that the patient will then receive a 10 gray boost  to the surgical scar. This will be accomplished at 2 gray per fraction. The final anticipated total dose therefore will correspond to 60.4 gray.    _______________________________   Jodelle Gross, MD, PhD

## 2017-11-22 ENCOUNTER — Telehealth: Payer: Self-pay | Admitting: General Practice

## 2017-11-22 NOTE — Telephone Encounter (Signed)
[facility] CSW Progress Note  Call from outpatient therapist, patient has been successfully linked w [name] LCSW for work on Sports administrator.  Edwyna Shell, LCSW Clinical Social Worker Phone:  [phone]

## 2017-11-25 ENCOUNTER — Encounter: Payer: Self-pay | Admitting: Physical Therapy

## 2017-11-25 ENCOUNTER — Other Ambulatory Visit: Payer: Medicare Other

## 2017-11-25 ENCOUNTER — Ambulatory Visit: Payer: Medicare Other

## 2017-11-25 ENCOUNTER — Ambulatory Visit: Payer: Medicare Other | Admitting: Physical Therapy

## 2017-11-25 ENCOUNTER — Ambulatory Visit: Payer: Medicare Other | Admitting: Hematology and Oncology

## 2017-11-25 DIAGNOSIS — M6281 Muscle weakness (generalized): Secondary | ICD-10-CM

## 2017-11-25 DIAGNOSIS — R262 Difficulty in walking, not elsewhere classified: Secondary | ICD-10-CM | POA: Diagnosis not present

## 2017-11-25 DIAGNOSIS — M25611 Stiffness of right shoulder, not elsewhere classified: Secondary | ICD-10-CM | POA: Diagnosis not present

## 2017-11-25 DIAGNOSIS — R293 Abnormal posture: Secondary | ICD-10-CM | POA: Diagnosis not present

## 2017-11-25 NOTE — Therapy (Signed)
[place], [place], [address] Phone: [phone]   Fax:  [phone]  Physical Therapy Treatment  Patient Details  Name: Gloria Ware MRN: [id number] Date of Birth: [birth year] Referring Provider: Dr. [name]   Encounter Date: 11/25/2017  PT End of Session - 11/25/17 1346    Visit Number  13    Number of Visits  21    Date for PT Re-Evaluation  12/03/17    PT Start Time  4315    PT Stop Time  1343    PT Time Calculation (min)  38 min    Activity Tolerance  Patient tolerated treatment well    Behavior During Therapy  [facility] for tasks assessed/performed       Past Medical History:  Diagnosis Date  . Anxiety   . Breast CA ([place])   . Colon polyp 09/12/2005  . Depression   . Eczema    right forearm   . Family history of breast cancer   . Genetic testing 06/27/2017   Multi-Cancer panel (83 genes) @ [facility] - No pathogenic mutations detected  . Hearing loss   . High cholesterol   . Injury     left foot metatarsal fracture sustined 08-03-17 , treated with boot; patient  reports  improvement as of today  . OCD (obsessive compulsive disorder)   . Osteopenia 11/2016   T score -2.1 stable from prior DEXA  . PONV (postoperative nausea and vomiting)    post op nausea  . Tinnitus     Past Surgical History:  Procedure Laterality Date  . CARDIAC CATHETERIZATION     husband was having symtpoms and needed a stent; subsequently she states she started " to feel something"  but thinks it was " all in my head bc of my hbsand". subsequently saw same cardiologist as her husband who sent for treadmill stress that had inconcluisicve results so she was sent for cardiac catherization which she states was " definitely negative "   . cataract surg    . DILATION AND CURETTAGE OF UTERUS    . IR CV LINE INJECTION  09/30/2017  . MASTECTOMY W/ SENTINEL NODE BIOPSY Right 08/06/2017   Procedure: RIGHT MASTECTOMY WITH  SENTINEL LYMPH NODE BIOPSY;  Surgeon: [name], MD;  Location: [place];  Service: General;  Laterality: Right;  . MOUTH SURGERY    . PORTACATH PLACEMENT N/A 09/12/2017   Procedure: INSERTION PORT-A-CATH;  Surgeon: [name], MD;  [facility];  Service: General;  Laterality: N/A;  . RADIOACTIVE SEED GUIDED AXILLARY SENTINEL LYMPH NODE Right 08/06/2017   Procedure: SEED TARGETED AXILLARY LYMPH NODE EXCISION;  Surgeon: [name], MD;  Location: [place];  Service: General;  Laterality: Right;    There were no vitals filed for this visit.  Subjective Assessment - 11/25/17 1318    Subjective  I am having a good day today. I walked around outside today and was able to get in the car without any help. I went out at night over the weekend and I made an effort to get dressed.     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 It measured 1 cm and is located in the upper inner quadrant. 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 She underwent a right mastectomy and  sentinel node biopsy on 08/06/17. She is wearing a boot on her left foot due to a fall and foot fracture 08/04/17. Her orthopedist for her foot is Dr. Doran Durand.     Patient Stated Goals  Make sure my arm is ok and that I'm strong enough for chemotherapy.    Currently in Pain?  No/denies    Pain Score  0-No pain                      [facility] Adult PT Treatment/Exercise - 11/25/17 0001      Knee/Hip Exercises: Aerobic   Stationary Bike  No resistance x8 mins      Knee/Hip Exercises: Standing   Heel Raises  -- 14 reps      Knee/Hip Exercises: Seated   Long Arc Quad  Strengthening;Right;Left;20 reps;Weights    Long Arc Quad Weight  2 lbs.    Ball Squeeze  20x with 5 sec holds    Other Seated Knee/Hip Exercises  Gluteal  squeezes 20x    Marching  Strengthening;Right;Left;20 reps;Weights    Marching Weights  2 lbs.    Sit to Sand  10 reps with only 1 UE support                   Breast Clinic Goals - 06/26/17 2119      Patient will be able to verbalize understanding of pertinent lymphedema risk reduction practices relevant to her diagnosis specifically related to skin care.   Time  1    Period  Days    Status  Achieved      Patient will be able to return demonstrate and/or verbalize understanding of the post-op home exercise program related to regaining shoulder range of motion.   Time  1    Period  Days    Status  Achieved      Patient will be able to verbalize understanding of the importance of attending the postoperative After Breast Cancer Class for further lymphedema risk reduction education and therapeutic exercise.   Time  1    Period  Days    Status  Achieved       Long Term Clinic Goals - 11/01/17 1025      CC Long Term Goal  #1   Title  Patient will perform home exercise program correctly and independently.    Time  8    Period  Weeks    Status  On-going      CC Long Term Goal  #2   Title  Patient will demonstrate right shoulder active flexion at >/= 130 degrees for increased ease reaching overhead.    Baseline  111 degrees today, 11/01/17- 125 degrees    Time  8    Period  Weeks    Status  On-going      CC Long Term Goal  #3   Title  Patient will demonstrate right shoulder active abduction at >/= 137 degrees for increased ease reaching overhead.    Baseline  125 degrees today, 11/01/17- 121 degrees     Time  8    Period  Weeks    Status  On-going      CC Long Term Goal  #4   Title  Patient will report she is able to participate in a regular walking program for 30 minutes, 5-6 days per week for reducing recurrence risk and better tolerance of chemotherapy.    Baseline  Unable to walk long distance with boot  on., 11/01/17- pt has been unable to walk due to shakiness     Time  8    Period  Weeks    Status  On-going      CC Long Term Goal  #5   Title  Patient will verbalize understanding of risk reduction practices related to lymphedema.    Baseline  11/01/17- pt required some cueing for this    Time  8    Period  Weeks    Status  On-going      CC Long Term Goal  #6   Title  Quick DASH score reduced to </= 4 for improved function of right arm.    Time  8    Period  Weeks    Status  On-going         Plan - 11/25/17 1346    Clinical Impression Statement  Patient had a great day in therapy. She was able to tolerate increased weights during LE exercises today from 1 to 2 lbs. She did  not need assistance to walk to treatment room or to her car. She demonstrated good step length with only occasional cues to increase foot clearance. Pt also was able to demonstrate 10 reps of sit to stand with use of only 1 UE. Gave pt info to obtain adjustable ankle weights for home use.     Rehab Potential  Excellent    Clinical Impairments Affecting Rehab Potential  Recently healed Lt foot fracture; started chemo 09/16/17 but has now switched to Taxol due to poor tolerance/decreased WBC=pt very weak; pt no longer receiving chemo    PT Frequency  2x / week    PT Duration  8 weeks    PT Treatment/Interventions  Therapeutic exercise;Patient/family education;Balance training;Neuromuscular re-education;Gait training;ADLs/Self Care Home Management;Therapeutic activities;Functional mobility training;Manual techniques;Passive range of motion;Scar mobilization    PT Next Visit Plan  See if they were able to obtain adjustable ankle weights, Cont Nustep or bike as able.  Suggest more emphasis on functional/body weight strengthening such as sit to/from stand, step-ups, sidesteps, etc.; review HEP prn and LE strength and balance activities, encouraging pt throughout session.  Continue to encourage patient to consider an assistive device for stability.    PT Home Exercise Plan  Seated  and standing LE exercises    Consulted and Agree with Plan of Care  Patient       Patient will benefit from skilled therapeutic intervention in order to improve the following deficits and impairments:  Postural dysfunction, Decreased knowledge of precautions, Pain, Impaired UE functional use, Decreased range of motion, Difficulty walking, Decreased activity tolerance, Decreased strength  Visit Diagnosis: Muscle weakness (generalized)  Difficulty in walking, not elsewhere classified     Problem List Patient Active Problem List   Diagnosis Date Noted  . Insomnia 10/07/2017  . Dehydration 10/07/2017  . Port-A-Cath in place 09/30/2017  . Genetic testing 06/27/2017  . Family history of breast cancer   . Malignant neoplasm of upper-inner quadrant of right breast in female, estrogen receptor positive ([place]) 06/26/2017    Allyson Sabal [facility] 11/25/2017, 1:50 PM  [place], [place], [address] Phone: [phone]   Fax:  [phone]  Name: KARNA ABED MRN: [id number] Date of Birth: [birth year]  Manus Gunning, PT 11/25/17 1:50 PM

## 2017-11-26 ENCOUNTER — Telehealth: Payer: Self-pay

## 2017-11-26 ENCOUNTER — Telehealth: Payer: Self-pay | Admitting: *Deleted

## 2017-11-26 DIAGNOSIS — F411 Generalized anxiety disorder: Secondary | ICD-10-CM | POA: Diagnosis not present

## 2017-11-26 DIAGNOSIS — C50211 Malignant neoplasm of upper-inner quadrant of right female breast: Secondary | ICD-10-CM | POA: Diagnosis not present

## 2017-11-26 DIAGNOSIS — Z510 Encounter for antineoplastic radiation therapy: Secondary | ICD-10-CM | POA: Diagnosis not present

## 2017-11-26 DIAGNOSIS — Z170 Estrogen receptor positive status [ER+]: Secondary | ICD-10-CM | POA: Diagnosis not present

## 2017-11-26 NOTE — Telephone Encounter (Signed)
Left VM for patient informing her that her walker was available for pick up at the [employer] Address provided.  Cyndia Bent RN

## 2017-11-26 NOTE — Telephone Encounter (Signed)
Call received from Ms. Lemon in reference to order for a walker.  "Someone called about an order.  I do not know if Dr. Barry Dienes, Dr. Lisbeth Renshaw or Dr. Lindi Adie ordered it or where to pick it up.  Dr. Barry Dienes ordered the Physical therapy."  Call transferred to [facility] ext. [phone] for best help with this matter.

## 2017-11-27 ENCOUNTER — Ambulatory Visit: Payer: Medicare Other | Admitting: Physical Therapy

## 2017-11-27 ENCOUNTER — Telehealth: Payer: Self-pay | Admitting: *Deleted

## 2017-11-27 ENCOUNTER — Encounter: Payer: Self-pay | Admitting: Physical Therapy

## 2017-11-27 ENCOUNTER — Other Ambulatory Visit: Payer: Self-pay

## 2017-11-27 ENCOUNTER — Ambulatory Visit
Admission: RE | Admit: 2017-11-27 | Discharge: 2017-11-27 | Disposition: A | Discharge status: Home / Self Care | Payer: Medicare Other | Source: Ambulatory Visit | Attending: Radiation Oncology | Admitting: Radiation Oncology

## 2017-11-27 DIAGNOSIS — R262 Difficulty in walking, not elsewhere classified: Secondary | ICD-10-CM | POA: Diagnosis not present

## 2017-11-27 DIAGNOSIS — M25611 Stiffness of right shoulder, not elsewhere classified: Secondary | ICD-10-CM | POA: Diagnosis not present

## 2017-11-27 DIAGNOSIS — R293 Abnormal posture: Secondary | ICD-10-CM | POA: Diagnosis not present

## 2017-11-27 DIAGNOSIS — M6281 Muscle weakness (generalized): Secondary | ICD-10-CM | POA: Diagnosis not present

## 2017-11-27 DIAGNOSIS — Z170 Estrogen receptor positive status [ER+]: Secondary | ICD-10-CM | POA: Diagnosis not present

## 2017-11-27 DIAGNOSIS — C50211 Malignant neoplasm of upper-inner quadrant of right female breast: Secondary | ICD-10-CM | POA: Diagnosis not present

## 2017-11-27 DIAGNOSIS — Z510 Encounter for antineoplastic radiation therapy: Secondary | ICD-10-CM | POA: Diagnosis not present

## 2017-11-27 MED ORDER — DIAZEPAM 5 MG PO TABS: 5.0000 mg | ORAL_TABLET | Freq: Two times a day (BID) | ORAL | 0 refills | Status: DC | PRN

## 2017-11-27 NOTE — Telephone Encounter (Signed)
Received call from pt asking for refill on valium 5 mg that was prescribed by ED.  It was ordered 1 every 12 hrs for anxiety or muscle spasms.  She reports that it helps her at hs.  She also wants to know if she can take ativan during the day for anxiety.  She doesn't need it for nausea as prescribed.  She will see Dr Lisbeth Renshaw today @ 3 pm.  Message to Dr Lorinda Creed.

## 2017-11-27 NOTE — Therapy (Signed)
[place], [place], [address] Phone: [phone]   Fax:  [phone]  Physical Therapy Treatment  Patient Details  Name: Gloria Ware MRN: [id number] Date of Birth: [birth year] Referring Provider: Dr. [name]   Encounter Date: 11/27/2017  PT End of Session - 11/27/17 1527    Visit Number  14    Number of Visits  21    Date for PT Re-Evaluation  12/03/17    PT Start Time  6962    PT Stop Time  1430    PT Time Calculation (min)  41 min    Activity Tolerance  Patient tolerated treatment well    Behavior During Therapy  [facility] for tasks assessed/performed       Past Medical History:  Diagnosis Date  . Anxiety   . Breast CA ([place])   . Colon polyp 09/12/2005  . Depression   . Eczema    right forearm   . Family history of breast cancer   . Genetic testing 06/27/2017   Multi-Cancer panel (83 genes) @ [facility] - No pathogenic mutations detected  . Hearing loss   . High cholesterol   . Injury     left foot metatarsal fracture sustined 08-03-17 , treated with boot; patient  reports  improvement as of today  . OCD (obsessive compulsive disorder)   . Osteopenia 11/2016   T score -2.1 stable from prior DEXA  . PONV (postoperative nausea and vomiting)    post op nausea  . Tinnitus     Past Surgical History:  Procedure Laterality Date  . CARDIAC CATHETERIZATION     husband was having symtpoms and needed a stent; subsequently she states she started " to feel something"  but thinks it was " all in my head bc of my hbsand". subsequently saw same cardiologist as her husband who sent for treadmill stress that had inconcluisicve results so she was sent for cardiac catherization which she states was " definitely negative "   . cataract surg    . DILATION AND CURETTAGE OF UTERUS    . IR CV LINE INJECTION  09/30/2017  . MASTECTOMY W/ SENTINEL NODE BIOPSY Right 08/06/2017   Procedure: RIGHT MASTECTOMY WITH  SENTINEL LYMPH NODE BIOPSY;  Surgeon: [name], MD;  Location: [place];  Service: General;  Laterality: Right;  . MOUTH SURGERY    . PORTACATH PLACEMENT N/A 09/12/2017   Procedure: INSERTION PORT-A-CATH;  Surgeon: [name], MD;  Location: [facility];  Service: General;  Laterality: N/A;  . RADIOACTIVE SEED GUIDED AXILLARY SENTINEL LYMPH NODE Right 08/06/2017   Procedure: SEED TARGETED AXILLARY LYMPH NODE EXCISION;  Surgeon: [name], MD;  Location: [place];  Service: General;  Laterality: Right;    There were no vitals filed for this visit.  Subjective Assessment - 11/27/17 1356    Subjective  I picked up my walker before I came in today.    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 It measured 1 cm and is located in the upper inner quadrant. 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 She underwent a right mastectomy and sentinel node biopsy on 08/06/17. She is wearing a boot on her left foot due to a fall and foot fracture 08/04/17. Her orthopedist for her foot is Dr. Doran Durand.  Patient Stated Goals  Make sure my arm is ok and that I'm strong enough for chemotherapy.    Currently in Pain?  No/denies    Pain Score  0-No pain                      [facility] Adult PT Treatment/Exercise - 11/27/17 0001      Ambulation/Gait   Ambulation/Gait  Yes    Ambulation/Gait Assistance  5: Supervision    Ambulation/Gait Assistance Details  Adjusted pt's rollator to proper height, instructed pt in how to transfer from chair to rollator, proper use of brakes on rollator, how to sit on rollator with it against wall to prevent it from sliding backwards, how to ambulate with rollator- pt able to return demonstrate all of this and was able to do so independently by end of session    Ambulation  Distance (Feet)  150 Feet x 4    Assistive device  Rollator    Gait Pattern  -- pt demonstrated improved step length and foot clearance     Ambulation Surface  Level      Knee/Hip Exercises: Aerobic   Stationary Bike  No resistance x8 mins      Knee/Hip Exercises: Seated   Long Arc Quad  Strengthening;Right;Left;20 reps;Weights    Long Arc Quad Weight  2 lbs.    Marching  Strengthening;Right;Left;20 reps;Weights    Marching Weights  2 lbs.                   Breast Clinic Goals - 06/26/17 2119      Patient will be able to verbalize understanding of pertinent lymphedema risk reduction practices relevant to her diagnosis specifically related to skin care.   Time  1    Period  Days    Status  Achieved      Patient will be able to return demonstrate and/or verbalize understanding of the post-op home exercise program related to regaining shoulder range of motion.   Time  1    Period  Days    Status  Achieved      Patient will be able to verbalize understanding of the importance of attending the postoperative After Breast Cancer Class for further lymphedema risk reduction education and therapeutic exercise.   Time  1    Period  Days    Status  Achieved       Long Term Clinic Goals - 11/01/17 1025      CC Long Term Goal  #1   Title  Patient will perform home exercise program correctly and independently.    Time  8    Period  Weeks    Status  On-going      CC Long Term Goal  #2   Title  Patient will demonstrate right shoulder active flexion at >/= 130 degrees for increased ease reaching overhead.    Baseline  111 degrees today, 11/01/17- 125 degrees    Time  8    Period  Weeks    Status  On-going      CC Long Term Goal  #3   Title  Patient will demonstrate right shoulder active abduction at >/= 137 degrees for increased ease reaching overhead.    Baseline  125 degrees today, 11/01/17- 121 degrees     Time  8    Period  Weeks    Status  On-going      CC Long  Term Goal  #4  Title  Patient will report she is able to participate in a regular walking program for 30 minutes, 5-6 days per week for reducing recurrence risk and better tolerance of chemotherapy.    Baseline  Unable to walk long distance with boot on., 11/01/17- pt has been unable to walk due to shakiness    Time  8    Period  Weeks    Status  On-going      CC Long Term Goal  #5   Title  Patient will verbalize understanding of risk reduction practices related to lymphedema.    Baseline  11/01/17- pt required some cueing for this    Time  8    Period  Weeks    Status  On-going      CC Long Term Goal  #6   Title  Quick DASH score reduced to </= 4 for improved function of right arm.    Time  8    Period  Weeks    Status  On-going         Plan - 11/27/17 1528    Clinical Impression Statement  Pt received rollator from DME provider just prior to this appointment. She did not know how to use it. Spent session today focusing on proper way to ambulate with walker, how and when to use the brakes, how to transfer from chair to walker, how to sit in walker by pushing it against the wall so it does not slide out etc. By end of session pt felt very confident with rollator and was demonstrated improved step length and foot clearance. Encouraged pt to use this at all times.     Rehab Potential  Excellent    Clinical Impairments Affecting Rehab Potential  Recently healed Lt foot fracture; started chemo 09/16/17 but has now switched to Taxol due to poor tolerance/decreased WBC=pt very weak; pt no longer receiving chemo    PT Frequency  2x / week    PT Duration  8 weeks    PT Treatment/Interventions  Therapeutic exercise;Patient/family education;Balance training;Neuromuscular re-education;Gait training;ADLs/Self Care Home Management;Therapeutic activities;Functional mobility training;Manual techniques;Passive range of motion;Scar mobilization    PT Next Visit Plan  See if they were able to obtain  adjustable ankle weights, Cont Nustep or bike as able.  Suggest more emphasis on functional/body weight strengthening such as sit to/from stand, step-ups, sidesteps, etc.; review HEP prn and LE strength and balance activities, encouraging pt throughout session.     PT Home Exercise Plan  Seated and standing LE exercises, use rollator for ambulation    Consulted and Agree with Plan of Care  Patient       Patient will benefit from skilled therapeutic intervention in order to improve the following deficits and impairments:  Postural dysfunction, Decreased knowledge of precautions, Pain, Impaired UE functional use, Decreased range of motion, Difficulty walking, Decreased activity tolerance, Decreased strength  Visit Diagnosis: Muscle weakness (generalized)  Difficulty in walking, not elsewhere classified     Problem List Patient Active Problem List   Diagnosis Date Noted  . Insomnia 10/07/2017  . Dehydration 10/07/2017  . Port-A-Cath in place 09/30/2017  . Genetic testing 06/27/2017  . Family history of breast cancer   . Malignant neoplasm of upper-inner quadrant of right breast in female, estrogen receptor positive ([place]) 06/26/2017    Allyson Sabal [facility] 11/27/2017, 3:31 PM  [place], [place], [address] Phone: [phone]   Fax:  [phone]  Name: Gloria Nery  Ware MRN: [id number] Date of Birth: 08/14/1946  Allyson Sabal Romeo, PT 11/27/17 3:31 PM

## 2017-11-27 NOTE — Telephone Encounter (Signed)
Pt called stating she called [facility] and they did not have any records of an order for a walker for her. Santiago Glad, [facility] liason had sent me a message two days ago stating it was ready and I left a vm for patient yesterday regarding this. I called [employer] in [place] and confirmed it is available and ready for pick up. Informed pt of this. She is also asking for a script refill for Valium. It will be available for pick up today.  Cyndia Bent RN

## 2017-11-28 ENCOUNTER — Ambulatory Visit
Admission: RE | Admit: 2017-11-28 | Discharge: 2017-11-28 | Disposition: A | Discharge status: Home / Self Care | Payer: Medicare Other | Source: Ambulatory Visit | Attending: Radiation Oncology | Admitting: Radiation Oncology

## 2017-11-28 DIAGNOSIS — Z510 Encounter for antineoplastic radiation therapy: Secondary | ICD-10-CM | POA: Diagnosis not present

## 2017-11-28 DIAGNOSIS — C50211 Malignant neoplasm of upper-inner quadrant of right female breast: Secondary | ICD-10-CM | POA: Diagnosis not present

## 2017-11-28 DIAGNOSIS — Z170 Estrogen receptor positive status [ER+]: Secondary | ICD-10-CM | POA: Diagnosis not present

## 2017-11-29 ENCOUNTER — Ambulatory Visit
Admission: RE | Admit: 2017-11-29 | Discharge: 2017-11-29 | Disposition: A | Discharge status: Home / Self Care | Payer: Medicare Other | Source: Ambulatory Visit | Attending: Radiation Oncology | Admitting: Radiation Oncology

## 2017-11-29 DIAGNOSIS — Z170 Estrogen receptor positive status [ER+]: Principal | ICD-10-CM

## 2017-11-29 DIAGNOSIS — Z510 Encounter for antineoplastic radiation therapy: Secondary | ICD-10-CM | POA: Diagnosis not present

## 2017-11-29 DIAGNOSIS — C50211 Malignant neoplasm of upper-inner quadrant of right female breast: Secondary | ICD-10-CM | POA: Diagnosis not present

## 2017-11-29 MED ORDER — RADIAPLEXRX EX GEL
Freq: Once | CUTANEOUS | Status: AC
  Administered 2017-11-29: 17:00:00 via TOPICAL

## 2017-11-29 MED ORDER — ALRA NON-METALLIC DEODORANT (RAD-ONC)
1.0000 "application " | Freq: Once | TOPICAL | Status: AC
  Administered 2017-11-29: 1 via TOPICAL

## 2017-11-29 NOTE — Progress Notes (Signed)

## 2017-12-02 ENCOUNTER — Encounter: Payer: Self-pay | Admitting: Physical Therapy

## 2017-12-02 ENCOUNTER — Ambulatory Visit
Admission: RE | Admit: 2017-12-02 | Discharge: 2017-12-02 | Disposition: A | Discharge status: Home / Self Care | Payer: Medicare Other | Source: Ambulatory Visit | Attending: Radiation Oncology | Admitting: Radiation Oncology

## 2017-12-02 ENCOUNTER — Ambulatory Visit: Payer: Medicare Other

## 2017-12-02 ENCOUNTER — Other Ambulatory Visit: Payer: Medicare Other

## 2017-12-02 ENCOUNTER — Ambulatory Visit: Payer: Medicare Other | Admitting: Physical Therapy

## 2017-12-02 DIAGNOSIS — R262 Difficulty in walking, not elsewhere classified: Secondary | ICD-10-CM | POA: Diagnosis not present

## 2017-12-02 DIAGNOSIS — M6281 Muscle weakness (generalized): Secondary | ICD-10-CM | POA: Diagnosis not present

## 2017-12-02 DIAGNOSIS — M25611 Stiffness of right shoulder, not elsewhere classified: Secondary | ICD-10-CM | POA: Diagnosis not present

## 2017-12-02 DIAGNOSIS — R293 Abnormal posture: Secondary | ICD-10-CM | POA: Diagnosis not present

## 2017-12-02 DIAGNOSIS — C50211 Malignant neoplasm of upper-inner quadrant of right female breast: Secondary | ICD-10-CM | POA: Diagnosis not present

## 2017-12-02 DIAGNOSIS — Z170 Estrogen receptor positive status [ER+]: Secondary | ICD-10-CM | POA: Diagnosis not present

## 2017-12-02 DIAGNOSIS — Z510 Encounter for antineoplastic radiation therapy: Secondary | ICD-10-CM | POA: Diagnosis not present

## 2017-12-02 NOTE — Therapy (Signed)
[place], [place], [address] Phone: [phone]   Fax:  [phone]  Physical Therapy Treatment  Patient Details  Name: Gloria Ware MRN: [id number] Date of Birth: [birth year] Referring Provider: Dr. [name]   Encounter Date: 12/02/2017  PT End of Session - 12/02/17 1346    Visit Number  15    Number of Visits  21    Date for PT Re-Evaluation  12/03/17    PT Start Time  0102    PT Stop Time  0145    PT Time Calculation (min)  43 min    Activity Tolerance  Patient tolerated treatment well    Behavior During Therapy  [facility] for tasks assessed/performed       Past Medical History:  Diagnosis Date  . Anxiety   . Breast CA ([place])   . Colon polyp 09/12/2005  . Depression   . Eczema    right forearm   . Family history of breast cancer   . Genetic testing 06/27/2017   Multi-Cancer panel (83 genes) @ [facility] - No pathogenic mutations detected  . Hearing loss   . High cholesterol   . Injury     left foot metatarsal fracture sustined 08-03-17 , treated with boot; patient  reports  improvement as of today  . OCD (obsessive compulsive disorder)   . Osteopenia 11/2016   T score -2.1 stable from prior DEXA  . PONV (postoperative nausea and vomiting)    post op nausea  . Tinnitus     Past Surgical History:  Procedure Laterality Date  . CARDIAC CATHETERIZATION     husband was having symtpoms and needed a stent; subsequently she states she started " to feel something"  but thinks it was " all in my head bc of my hbsand". subsequently saw same cardiologist as her husband who sent for treadmill stress that had inconcluisicve results so she was sent for cardiac catherization which she states was " definitely negative "   . cataract surg    . DILATION AND CURETTAGE OF UTERUS    . IR CV LINE INJECTION  09/30/2017  . MASTECTOMY W/ SENTINEL NODE BIOPSY Right 08/06/2017   Procedure: RIGHT MASTECTOMY WITH  SENTINEL LYMPH NODE BIOPSY;  Surgeon: [name], MD;  Location: [place] [facility];  Service: General;  Laterality: Right;  . MOUTH SURGERY    . PORTACATH PLACEMENT N/A 09/12/2017   Procedure: INSERTION PORT-A-CATH;  Surgeon: [name], MD;  [facility];  Service: General;  Laterality: N/A;  . RADIOACTIVE SEED GUIDED AXILLARY SENTINEL LYMPH NODE Right 08/06/2017   Procedure: SEED TARGETED AXILLARY LYMPH NODE EXCISION;  Surgeon: [name], MD;  Location: [place];  Service: General;  Laterality: Right;    There were no vitals filed for this visit.  Subjective Assessment - 12/02/17 1306    Subjective  I am having a good day today. I have been walking a lot with my walker at home. I did not bring my walker today because I am walking so much better at home.     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 It measured 1 cm and is located in the upper inner quadrant. 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 She underwent a right mastectomy and sentinel node biopsy on 08/06/17.  She is wearing a boot on her left foot due to a fall and foot fracture 08/04/17. Her orthopedist for her foot is Dr. Doran Durand.     Patient Stated Goals  Make sure my arm is ok and that I'm strong enough for chemotherapy.    Currently in Pain?  No/denies    Pain Score  0-No pain                      [facility] Adult PT Treatment/Exercise - 12/02/17 0001      Neuro Re-ed    Neuro Re-ed Details   heel walking but pt had difficulty clearing toes from the floor with this so stopped secondary to this, toe walking x 2 up/down hallway, sidestepping in both directions down hallway, braiding down 1/2 hallway length in both directions with pt having increased difficulty and occasional LOB with this - therapist able to correct with  gait belt therapist held gait belt for all activities      Knee/Hip Exercises: Aerobic   Stationary Bike  No resistance x8 mins- encouraged pt to do a faster pace to try and keep the screen on       Knee/Hip Exercises: Seated   Long Arc Quad  Strengthening;Right;Left;Weights 30 reps    Long Arc Quad Weight  2 lbs.    Marching  Strengthening;Right;Left;Weights 30 lbs     Marching Weights  2 lbs.    Sit to Sand  without UE support;10 reps pt able to do this with encouragement and no UEs                   Breast Clinic Goals - 06/26/17 2119      Patient will be able to verbalize understanding of pertinent lymphedema risk reduction practices relevant to her diagnosis specifically related to skin care.   Time  1    Period  Days    Status  Achieved      Patient will be able to return demonstrate and/or verbalize understanding of the post-op home exercise program related to regaining shoulder range of motion.   Time  1    Period  Days    Status  Achieved      Patient will be able to verbalize understanding of the importance of attending the postoperative After Breast Cancer Class for further lymphedema risk reduction education and therapeutic exercise.   Time  1    Period  Days    Status  Achieved       Long Term Clinic Goals - 11/01/17 1025      CC Long Term Goal  #1   Title  Patient will perform home exercise program correctly and independently.    Time  8    Period  Weeks    Status  On-going      CC Long Term Goal  #2   Title  Patient will demonstrate right shoulder active flexion at >/= 130 degrees for increased ease reaching overhead.    Baseline  111 degrees today, 11/01/17- 125 degrees    Time  8    Period  Weeks    Status  On-going      CC Long Term Goal  #3   Title  Patient will demonstrate right shoulder active abduction at >/= 137 degrees for increased ease reaching overhead.    Baseline  125 degrees today, 11/01/17- 121 degrees     Time  8    Period   Weeks  Status  On-going      CC Long Term Goal  #4   Title  Patient will report she is able to participate in a regular walking program for 30 minutes, 5-6 days per week for reducing recurrence risk and better tolerance of chemotherapy.    Baseline  Unable to walk long distance with boot on., 11/01/17- pt has been unable to walk due to shakiness    Time  8    Period  Weeks    Status  On-going      CC Long Term Goal  #5   Title  Patient will verbalize understanding of risk reduction practices related to lymphedema.    Baseline  11/01/17- pt required some cueing for this    Time  8    Period  Weeks    Status  On-going      CC Long Term Goal  #6   Title  Quick DASH score reduced to </= 4 for improved function of right arm.    Time  8    Period  Weeks    Status  On-going         Plan - 12/02/17 1346    Clinical Impression Statement  Pt was able to walk with more normalized gait to treatment room without rollator today. Worked on neuro re ed today doing toe walking, braiding, and sidestepping down hall. Increased reps of all seated exercises by 10 and next session will increase ankle weights. Will assess all goals next session and update POC accordingly. Pt was also able to stand from a chair 10x today without use of UEs which she has not been able to do.     Rehab Potential  Excellent    Clinical Impairments Affecting Rehab Potential  Recently healed Lt foot fracture; started chemo 09/16/17 but has now switched to Taxol due to poor tolerance/decreased WBC=pt very weak; pt no longer receiving chemo    PT Frequency  2x / week    PT Duration  8 weeks    PT Treatment/Interventions  Therapeutic exercise;Patient/family education;Balance training;Neuromuscular re-education;Gait training;ADLs/Self Care Home Management;Therapeutic activities;Functional mobility training;Manual techniques;Passive range of motion;Scar mobilization    PT Next Visit Plan  See if they were able to obtain adjustable  ankle weights, Cont Nustep or bike as able.  Suggest more emphasis on functional/body weight strengthening such as sit to/from stand, step-ups, sidesteps, etc.; review HEP prn and LE strength and balance activities, encouraging pt throughout session.     PT Home Exercise Plan  Seated and standing LE exercises, use rollator for ambulation    Consulted and Agree with Plan of Care  Patient       Patient will benefit from skilled therapeutic intervention in order to improve the following deficits and impairments:  Postural dysfunction, Decreased knowledge of precautions, Pain, Impaired UE functional use, Decreased range of motion, Difficulty walking, Decreased activity tolerance, Decreased strength  Visit Diagnosis: Muscle weakness (generalized)  Difficulty in walking, not elsewhere classified     Problem List Patient Active Problem List   Diagnosis Date Noted  . Insomnia 10/07/2017  . Dehydration 10/07/2017  . Port-A-Cath in place 09/30/2017  . Genetic testing 06/27/2017  . Family history of breast cancer   . Malignant neoplasm of upper-inner quadrant of right breast in female, estrogen receptor positive ([place]) 06/26/2017    Allyson Sabal [facility] 12/02/2017, 1:50 PM  [place], [place], [address] Phone: [phone]   Fax:  [phone]  Name: ADELAYDE MINNEY MRN: [id number] Date of Birth: Aug 11, 1946  Manus Gunning, PT 12/02/17 1:50 PM

## 2017-12-02 NOTE — Patient Instructions (Signed)
Toe walking  Sidestepping keeping toes pointed forward - do this in both directions

## 2017-12-03 ENCOUNTER — Ambulatory Visit
Admission: RE | Admit: 2017-12-03 | Discharge: 2017-12-03 | Disposition: A | Discharge status: Home / Self Care | Payer: Medicare Other | Source: Ambulatory Visit | Attending: Radiation Oncology | Admitting: Radiation Oncology

## 2017-12-03 DIAGNOSIS — Z170 Estrogen receptor positive status [ER+]: Secondary | ICD-10-CM | POA: Diagnosis not present

## 2017-12-03 DIAGNOSIS — Z510 Encounter for antineoplastic radiation therapy: Secondary | ICD-10-CM | POA: Diagnosis not present

## 2017-12-03 DIAGNOSIS — C50211 Malignant neoplasm of upper-inner quadrant of right female breast: Secondary | ICD-10-CM | POA: Diagnosis not present

## 2017-12-04 ENCOUNTER — Ambulatory Visit: Payer: Medicare Other | Admitting: Physical Therapy

## 2017-12-04 ENCOUNTER — Ambulatory Visit
Admission: RE | Admit: 2017-12-04 | Discharge: 2017-12-04 | Disposition: A | Discharge status: Home / Self Care | Payer: Medicare Other | Source: Ambulatory Visit | Attending: Radiation Oncology | Admitting: Radiation Oncology

## 2017-12-04 ENCOUNTER — Encounter: Payer: Self-pay | Admitting: Physical Therapy

## 2017-12-04 DIAGNOSIS — M6281 Muscle weakness (generalized): Secondary | ICD-10-CM

## 2017-12-04 DIAGNOSIS — Z170 Estrogen receptor positive status [ER+]: Secondary | ICD-10-CM | POA: Diagnosis not present

## 2017-12-04 DIAGNOSIS — R262 Difficulty in walking, not elsewhere classified: Secondary | ICD-10-CM

## 2017-12-04 DIAGNOSIS — C50211 Malignant neoplasm of upper-inner quadrant of right female breast: Secondary | ICD-10-CM | POA: Diagnosis not present

## 2017-12-04 DIAGNOSIS — Z510 Encounter for antineoplastic radiation therapy: Secondary | ICD-10-CM | POA: Diagnosis not present

## 2017-12-04 DIAGNOSIS — M25611 Stiffness of right shoulder, not elsewhere classified: Secondary | ICD-10-CM | POA: Diagnosis not present

## 2017-12-04 DIAGNOSIS — R293 Abnormal posture: Secondary | ICD-10-CM | POA: Diagnosis not present

## 2017-12-04 NOTE — Therapy (Signed)
[name], [place], [address] Phone: [phone]   Fax:  [phone]  Physical Therapy Treatment  Patient Details  Name: Gloria Ware MRN: [id number] Date of Birth: September 14, 1946 Referring Provider: Dr. [name]   Encounter Date: 12/04/2017  PT End of Session - 12/04/17 1623    Visit Number  16    Number of Visits  29    Date for PT Re-Evaluation  01/01/18    PT Start Time  1351    PT Stop Time  1432    PT Time Calculation (min)  41 min    Activity Tolerance  Patient tolerated treatment well    Behavior During Therapy  [facility] for tasks assessed/performed       Past Medical History:  Diagnosis Date  . Anxiety   . Breast CA ([place])   . Colon polyp 09/12/2005  . Depression   . Eczema    right forearm   . Family history of breast cancer   . Genetic testing 06/27/2017   Multi-Cancer panel (83 genes) @ [facility] - No pathogenic mutations detected  . Hearing loss   . High cholesterol   . Injury     left foot metatarsal fracture sustined 08-03-17 , treated with boot; patient  reports  improvement as of today  . OCD (obsessive compulsive disorder)   . Osteopenia 11/2016   T score -2.1 stable from prior DEXA  . PONV (postoperative nausea and vomiting)    post op nausea  . Tinnitus     Past Surgical History:  Procedure Laterality Date  . CARDIAC CATHETERIZATION     husband was having symtpoms and needed a stent; subsequently she states she started " to feel something"  but thinks it was " all in my head bc of my hbsand". subsequently saw same cardiologist as her husband who sent for treadmill stress that had inconcluisicve results so she was sent for cardiac catherization which she states was " definitely negative "   . cataract surg    . DILATION AND CURETTAGE OF UTERUS    . IR CV LINE INJECTION  09/30/2017  . MASTECTOMY W/ SENTINEL NODE BIOPSY Right 08/06/2017   Procedure: RIGHT MASTECTOMY WITH  SENTINEL LYMPH NODE BIOPSY;  Surgeon: [name], MD;  Location: [facility];  Service: General;  Laterality: Right;  . MOUTH SURGERY    . PORTACATH PLACEMENT N/A 09/12/2017   Procedure: INSERTION PORT-A-CATH;  Surgeon: [name], MD;  Location: [facility];  Service: General;  Laterality: N/A;  . RADIOACTIVE SEED GUIDED AXILLARY SENTINEL LYMPH NODE Right 08/06/2017   Procedure: SEED TARGETED AXILLARY LYMPH NODE EXCISION;  Surgeon: [name], MD;  Location: [facility];  Service: General;  Laterality: Right;    There were no vitals filed for this visit.  Subjective Assessment - 12/04/17 1353    Subjective  I ordered the ankle weights online. I am feeling better anxiety wise because I can see real improvement in my walking.     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 It measured 1 cm and is located in the upper inner quadrant. 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 She underwent a right mastectomy and sentinel node biopsy on 08/06/17. She is wearing a boot on her left foot due to a fall  and foot fracture 08/04/17. Her orthopedist for her foot is Dr. Doran Durand.     Patient Stated Goals  Make sure my arm is ok and that I'm strong enough for chemotherapy.    Currently in Pain?  No/denies    Pain Score  0-No pain         [facility] PT Assessment - 12/04/17 0001      Standardized Balance Assessment   Standardized Balance Assessment  Timed Up and Go Test 30 sec sit to stand: 6 reps      Timed Up and Go Test   TUG  Normal TUG    Normal TUG (seconds)  25                  [facility] Adult PT Treatment/Exercise - 12/04/17 0001      Knee/Hip Exercises: Aerobic   Stationary Bike  No resistance x5 mins- encouraged pt to do a faster pace to try and keep the screen on       Shoulder Exercises: Supine    Flexion  AAROM;5 reps;Both with dowel with 5 sec holds    ABduction  AAROM;Right;5 reps with dowel with 10 sec holds      Manual Therapy   Manual Therapy  Passive ROM    Passive ROM  to right shoulder in direction of abduction since pt is having increased trouble with radiation due to tightness in armpit                   Breast Clinic Goals - 06/26/17 2119      Patient will be able to verbalize understanding of pertinent lymphedema risk reduction practices relevant to her diagnosis specifically related to skin care.   Time  1    Period  Days    Status  Achieved      Patient will be able to return demonstrate and/or verbalize understanding of the post-op home exercise program related to regaining shoulder range of motion.   Time  1    Period  Days    Status  Achieved      Patient will be able to verbalize understanding of the importance of attending the postoperative After Breast Cancer Class for further lymphedema risk reduction education and therapeutic exercise.   Time  1    Period  Days    Status  Achieved       Long Term Clinic Goals - 12/04/17 1354      CC Long Term Goal  #1   Title  Patient will perform home exercise program correctly and independently.    Time  8    Period  Weeks    Status  On-going      CC Long Term Goal  #2   Title  Patient will demonstrate right shoulder active flexion at >/= 130 degrees for increased ease reaching overhead.    Baseline  111 degrees today, 11/01/17- 125 degrees, 12/04/17- 132 degrees    Time  8    Period  Weeks    Status  Achieved      CC Long Term Goal  #3   Title  Patient will demonstrate right shoulder active abduction at >/= 137 degrees for increased ease reaching overhead.    Baseline  125 degrees today, 11/01/17- 121 degrees, 12/04/17- 115 degrees    Time  8    Period  Weeks    Status  On-going      CC Long Term Goal  #4   Title  Patient will report she is able to participate in a regular walking program  for 30 minutes, 5-6 days per week for reducing recurrence risk and better tolerance of chemotherapy.    Baseline  Unable to walk long distance with boot on., 11/01/17- pt has been unable to walk due to shakiness, 12/04/17- pt now has a rollator and can walk around in her house with it, she has just started walking outside since her shakiness is more controlled    Time  8    Period  Weeks    Status  On-going      CC Long Term Goal  #5   Title  Patient will verbalize understanding of risk reduction practices related to lymphedema.    Baseline  11/01/17- pt required some cueing for this, 12/04/17- pt able to verbalize this    Time  8    Period  Weeks    Status  Achieved      CC Long Term Goal  #6   Title  Quick DASH score reduced to </= 4 for improved function of right arm.    Time  8    Period  Weeks    Status  On-going      CC Long Term Goal  #7   Title  Pt will be able to sit to stand 12x in 30 seconds to decrease risk of falls    Baseline  6    Time  4    Period  Weeks    Status  New    Target Date  01/01/18      CC Long Term Goal  #8   Title  Pt to be able to complete TUG in less than 13 seconds without use of UEs to decrease risk of falls    Baseline  25 seconds without use of UEs    Time  4    Period  Weeks    Status  New    Target Date  01/01/18      Additional Goals   Additional Goals  Yes         Plan - 12/04/17 1623    Clinical Impression Statement  Assessed pt's progress towards all goals in therapy. She has met her right shoulder flexion goal but is still limited in right shoulder abduction and is having difficulty obtaining position needed for radiation. Focused on PROM to R shoulder in direction of abduction today to improve ROM and decrease discomfort. Instructed pt in supine dowel exercises and added these to pt's home exercise proram. Assessed pt's 30 sec sit to stands and she was able to complete 6 reps - for community dwelling elderly of her age she should be  able to complete 13 reps to decrease fall risk. Added a goal for this. Also assessed pt's TUG which she took 25 seconds to complete which places her at an increased fall risk. Added a goal for this. Overall pt is improving and is not demonstrating increased shakiness as much and is now able to stand from a chair without use of UE support but she would still benefit from skilled PT services to increase strength in her UE and LEs, improve right shoulder ROM and decrease her fall risk.     Rehab Potential  Excellent    Clinical Impairments Affecting Rehab Potential  Recently healed Lt foot fracture; started chemo 09/16/17 but has now switched to Taxol due to poor tolerance/decreased WBC=pt very weak; pt no longer receiving chemo, pt currently receiving  radiation    PT Frequency  2x / week    PT Duration  8 weeks    PT Treatment/Interventions  Therapeutic exercise;Patient/family education;Balance training;Neuromuscular re-education;Gait training;ADLs/Self Care Home Management;Therapeutic activities;Functional mobility training;Manual techniques;Passive range of motion;Scar mobilization    PT Next Visit Plan  See if they were able to obtain adjustable ankle weights, Cont Nustep or bike as able.  R shoulder PROM in to abduction, Suggest more emphasis on functional/body weight strengthening such as sit to/from stand, step-ups, sidesteps, etc.; review HEP prn and LE strength and balance activities, encouraging pt throughout session.     PT Home Exercise Plan  Seated and standing LE exercises, use rollator for ambulation    Consulted and Agree with Plan of Care  Patient       Patient will benefit from skilled therapeutic intervention in order to improve the following deficits and impairments:  Postural dysfunction, Decreased knowledge of precautions, Pain, Impaired UE functional use, Decreased range of motion, Difficulty walking, Decreased activity tolerance, Decreased strength  Visit Diagnosis: Muscle  weakness (generalized)  Difficulty in walking, not elsewhere classified  Abnormal posture  Stiffness of right shoulder, not elsewhere classified     Problem List Patient Active Problem List   Diagnosis Date Noted  . Insomnia 10/07/2017  . Dehydration 10/07/2017  . Port-A-Cath in place 09/30/2017  . Genetic testing 06/27/2017  . Family history of breast cancer   . Malignant neoplasm of upper-inner quadrant of right breast in female, estrogen receptor positive ([place]) 06/26/2017    Allyson Sabal [facility] 12/04/2017, 4:29 PM  [place], [place], [address] Phone: [phone]   Fax:  [phone]  Name: JERUSHA REISING MRN: [id number] Date of Birth: Sep 12, 1946  Manus Gunning, PT 12/04/17 4:29 PM

## 2017-12-04 NOTE — Patient Instructions (Signed)
Shoulder: Flexion (Supine)    With hands shoulder width apart, slowly lower dowel to floor behind head. Do not let elbows bend. Keep back flat. Hold _10-30___ seconds. Repeat __10__ times. Do __2__ sessions per day. CAUTION: Stretch slowly and gently.  Copyright  VHI. All rights reserved.  Shoulder: Abduction (Supine)    With right arm flat on floor, hold dowel in palm. Slowly move arm up to side of head by pushing with opposite arm. Do not let elbow bend. Hold _10-30___ seconds. Repeat _10___ times. Do _2___ sessions per day. CAUTION: Stretch slowly and gently.  Copyright  VHI. All rights reserved.   

## 2017-12-05 ENCOUNTER — Ambulatory Visit
Admission: RE | Admit: 2017-12-05 | Discharge: 2017-12-05 | Disposition: A | Discharge status: Home / Self Care | Payer: Medicare Other | Source: Ambulatory Visit | Attending: Radiation Oncology | Admitting: Radiation Oncology

## 2017-12-05 DIAGNOSIS — Z510 Encounter for antineoplastic radiation therapy: Secondary | ICD-10-CM | POA: Diagnosis not present

## 2017-12-05 DIAGNOSIS — F411 Generalized anxiety disorder: Secondary | ICD-10-CM | POA: Diagnosis not present

## 2017-12-05 DIAGNOSIS — C50211 Malignant neoplasm of upper-inner quadrant of right female breast: Secondary | ICD-10-CM | POA: Diagnosis not present

## 2017-12-05 DIAGNOSIS — Z170 Estrogen receptor positive status [ER+]: Secondary | ICD-10-CM | POA: Diagnosis not present

## 2017-12-06 ENCOUNTER — Ambulatory Visit
Admission: RE | Admit: 2017-12-06 | Discharge: 2017-12-06 | Disposition: A | Discharge status: Home / Self Care | Payer: Medicare Other | Source: Ambulatory Visit | Attending: Radiation Oncology | Admitting: Radiation Oncology

## 2017-12-06 DIAGNOSIS — Z170 Estrogen receptor positive status [ER+]: Secondary | ICD-10-CM | POA: Diagnosis not present

## 2017-12-06 DIAGNOSIS — Z510 Encounter for antineoplastic radiation therapy: Secondary | ICD-10-CM | POA: Insufficient documentation

## 2017-12-06 DIAGNOSIS — C50211 Malignant neoplasm of upper-inner quadrant of right female breast: Secondary | ICD-10-CM | POA: Insufficient documentation

## 2017-12-09 ENCOUNTER — Ambulatory Visit: Payer: Medicare Other

## 2017-12-09 ENCOUNTER — Encounter: Payer: Self-pay | Admitting: Physical Therapy

## 2017-12-09 ENCOUNTER — Ambulatory Visit: Payer: Medicare Other | Attending: General Surgery | Admitting: Physical Therapy

## 2017-12-09 ENCOUNTER — Ambulatory Visit: Payer: Medicare Other | Admitting: Hematology and Oncology

## 2017-12-09 ENCOUNTER — Other Ambulatory Visit: Payer: Medicare Other

## 2017-12-09 ENCOUNTER — Ambulatory Visit
Admission: RE | Admit: 2017-12-09 | Discharge: 2017-12-09 | Disposition: A | Discharge status: Home / Self Care | Payer: Medicare Other | Source: Ambulatory Visit | Attending: Radiation Oncology | Admitting: Radiation Oncology

## 2017-12-09 DIAGNOSIS — R293 Abnormal posture: Secondary | ICD-10-CM | POA: Diagnosis not present

## 2017-12-09 DIAGNOSIS — C773 Secondary and unspecified malignant neoplasm of axilla and upper limb lymph nodes: Secondary | ICD-10-CM | POA: Diagnosis not present

## 2017-12-09 DIAGNOSIS — M25611 Stiffness of right shoulder, not elsewhere classified: Secondary | ICD-10-CM | POA: Insufficient documentation

## 2017-12-09 DIAGNOSIS — R262 Difficulty in walking, not elsewhere classified: Secondary | ICD-10-CM | POA: Diagnosis not present

## 2017-12-09 DIAGNOSIS — Z170 Estrogen receptor positive status [ER+]: Secondary | ICD-10-CM | POA: Diagnosis not present

## 2017-12-09 DIAGNOSIS — M6281 Muscle weakness (generalized): Secondary | ICD-10-CM | POA: Insufficient documentation

## 2017-12-09 DIAGNOSIS — C50919 Malignant neoplasm of unspecified site of unspecified female breast: Secondary | ICD-10-CM | POA: Diagnosis not present

## 2017-12-09 DIAGNOSIS — F419 Anxiety disorder, unspecified: Secondary | ICD-10-CM | POA: Diagnosis not present

## 2017-12-09 DIAGNOSIS — C50211 Malignant neoplasm of upper-inner quadrant of right female breast: Secondary | ICD-10-CM | POA: Diagnosis not present

## 2017-12-09 DIAGNOSIS — Z0000 Encounter for general adult medical examination without abnormal findings: Secondary | ICD-10-CM | POA: Diagnosis not present

## 2017-12-09 DIAGNOSIS — E7800 Pure hypercholesterolemia, unspecified: Secondary | ICD-10-CM | POA: Diagnosis not present

## 2017-12-09 DIAGNOSIS — F324 Major depressive disorder, single episode, in partial remission: Secondary | ICD-10-CM | POA: Diagnosis not present

## 2017-12-09 DIAGNOSIS — Z6825 Body mass index (BMI) 25.0-25.9, adult: Secondary | ICD-10-CM | POA: Diagnosis not present

## 2017-12-09 DIAGNOSIS — Z510 Encounter for antineoplastic radiation therapy: Secondary | ICD-10-CM | POA: Diagnosis not present

## 2017-12-09 NOTE — Therapy (Signed)
[place], [place], [address] Phone: [phone]   Fax:  [phone]  Physical Therapy Treatment  Patient Details  Name: Gloria Ware MRN: [id number] Date of Birth: 08-06-1946 Referring Provider: Dr. [name]   Encounter Date: 12/09/2017  PT End of Session - 12/09/17 1706    Visit Number  17    Number of Visits  29    Date for PT Re-Evaluation  01/01/18    PT Start Time  1339    PT Stop Time  1428    PT Time Calculation (min)  49 min    Activity Tolerance  Patient tolerated treatment well    Behavior During Therapy  [facility] for tasks assessed/performed       Past Medical History:  Diagnosis Date  . Anxiety   . Breast CA ([place])   . Colon polyp 09/12/2005  . Depression   . Eczema    right forearm   . Family history of breast cancer   . Genetic testing 06/27/2017   Multi-Cancer panel (83 genes) @ [facility] - No pathogenic mutations detected  . Hearing loss   . High cholesterol   . Injury     left foot metatarsal fracture sustined 08-03-17 , treated with boot; patient  reports  improvement as of today  . OCD (obsessive compulsive disorder)   . Osteopenia 11/2016   T score -2.1 stable from prior DEXA  . PONV (postoperative nausea and vomiting)    post op nausea  . Tinnitus     Past Surgical History:  Procedure Laterality Date  . CARDIAC CATHETERIZATION     husband was having symtpoms and needed a stent; subsequently she states she started " to feel something"  but thinks it was " all in my head bc of my hbsand". subsequently saw same cardiologist as her husband who sent for treadmill stress that had inconcluisicve results so she was sent for cardiac catherization which she states was " definitely negative "   . cataract surg    . DILATION AND CURETTAGE OF UTERUS    . IR CV LINE INJECTION  09/30/2017  . MASTECTOMY W/ SENTINEL NODE BIOPSY Right 08/06/2017   Procedure: RIGHT MASTECTOMY WITH  SENTINEL LYMPH NODE BIOPSY;  Surgeon: [name], MD;  Location: [facility];  Service: General;  Laterality: Right;  . MOUTH SURGERY    . PORTACATH PLACEMENT N/A 09/12/2017   Procedure: INSERTION PORT-A-CATH;  Surgeon: [name], MD;  Location[facility];  Service: General;  Laterality: N/A;  . RADIOACTIVE SEED GUIDED AXILLARY SENTINEL LYMPH NODE Right 08/06/2017   Procedure: SEED TARGETED AXILLARY LYMPH NODE EXCISION;  Surgeon: [name], MD;  Location: [facility] [place];  Service: General;  Laterality: Right;    There were no vitals filed for this visit.  Subjective Assessment - 12/09/17 1344    Subjective  I have been walking. I walked all the way down the block and back. I am not up to half an hour yet. I am doing the stretches you showed me last time.     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 It measured 1 cm and is located in the upper inner quadrant. 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 She underwent a right mastectomy and sentinel node biopsy on 08/06/17. She  is wearing a boot on her left foot due to a fall and foot fracture 08/04/17. Her orthopedist for her foot is Dr. Doran Durand.     Patient Stated Goals  Make sure my arm is ok and that I'm strong enough for chemotherapy.    Currently in Pain?  No/denies    Pain Score  0-No pain                      [facility] Adult PT Treatment/Exercise - 12/09/17 0001      Knee/Hip Exercises: Aerobic   Stationary Bike  No resistance x7 mins- encouraged pt to do a faster pace to try and keep the screen on by using metronome set to 70, this helped but pt still slows secondary to fatiuge set metronome on 70 bpm for pt to keep up speed       Knee/Hip Exercises: Seated   Long Arc Quad  Strengthening;Right;Left;Weights 30 reps    Long Arc Quad  Weight  2 lbs. 2.5    Marching  Strengthening;Right;Left;Weights 30 lbs     Marching Weights  2 lbs. 2.5      Shoulder Exercises: Supine   ABduction  AAROM;Right with dowel with 10 sec holds- reviewed these with pt v/c      Shoulder Exercises: Pulleys   Flexion  2 minutes    ABduction  2 minutes      Shoulder Exercises: Therapy Ball   Flexion  10 reps with stretch at top      Manual Therapy   Manual Therapy  Passive ROM    Passive ROM  to right shoulder in direction of abduction and flexion since pt is having increased trouble with radiation due to tightness in armpit                   Breast Clinic Goals - 06/26/17 2119      Patient will be able to verbalize understanding of pertinent lymphedema risk reduction practices relevant to her diagnosis specifically related to skin care.   Time  1    Period  Days    Status  Achieved      Patient will be able to return demonstrate and/or verbalize understanding of the post-op home exercise program related to regaining shoulder range of motion.   Time  1    Period  Days    Status  Achieved      Patient will be able to verbalize understanding of the importance of attending the postoperative After Breast Cancer Class for further lymphedema risk reduction education and therapeutic exercise.   Time  1    Period  Days    Status  Achieved       Long Term Clinic Goals - 12/04/17 1354      CC Long Term Goal  #1   Title  Patient will perform home exercise program correctly and independently.    Time  8    Period  Weeks    Status  On-going      CC Long Term Goal  #2   Title  Patient will demonstrate right shoulder active flexion at >/= 130 degrees for increased ease reaching overhead.    Baseline  111 degrees today, 11/01/17- 125 degrees, 12/04/17- 132 degrees    Time  8    Period  Weeks    Status  Achieved      CC Long Term Goal  #3   Title  Patient will demonstrate right shoulder  active abduction at >/= 137 degrees for  increased ease reaching overhead.    Baseline  125 degrees today, 11/01/17- 121 degrees, 12/04/17- 115 degrees    Time  8    Period  Weeks    Status  On-going      CC Long Term Goal  #4   Title  Patient will report she is able to participate in a regular walking program for 30 minutes, 5-6 days per week for reducing recurrence risk and better tolerance of chemotherapy.    Baseline  Unable to walk long distance with boot on., 11/01/17- pt has been unable to walk due to shakiness, 12/04/17- pt now has a rollator and can walk around in her house with it, she has just started walking outside since her shakiness is more controlled    Time  8    Period  Weeks    Status  On-going      CC Long Term Goal  #5   Title  Patient will verbalize understanding of risk reduction practices related to lymphedema.    Baseline  11/01/17- pt required some cueing for this, 12/04/17- pt able to verbalize this    Time  8    Period  Weeks    Status  Achieved      CC Long Term Goal  #6   Title  Quick DASH score reduced to </= 4 for improved function of right arm.    Time  8    Period  Weeks    Status  On-going      CC Long Term Goal  #7   Title  Pt will be able to sit to stand 12x in 30 seconds to decrease risk of falls    Baseline  6    Time  4    Period  Weeks    Status  New    Target Date  01/01/18      CC Long Term Goal  #8   Title  Pt to be able to complete TUG in less than 13 seconds without use of UEs to decrease risk of falls    Baseline  25 seconds without use of UEs    Time  4    Period  Weeks    Status  New    Target Date  01/01/18      Additional Goals   Additional Goals  Yes         Plan - 12/09/17 1706    Clinical Impression Statement  Added new AAROM today to help increase right shoulder ROM and then performed PROM to right shoulder. Pt obtained ankle weights so showed pt how to use these and had pt perform LE seated exercises with 2.5 lb ankle weights. Pt was able to keep the screen  on the bike on longer today after therapist started a metronome to help pt keep up a pace that was enough to keep the bike on. She fatigues quickly with this.     Rehab Potential  Excellent    Clinical Impairments Affecting Rehab Potential  Recently healed Lt foot fracture; started chemo 09/16/17 but has now switched to Taxol due to poor tolerance/decreased WBC=pt very weak; pt no longer receiving chemo, pt currently receiving radiation    PT Frequency  2x / week    PT Duration  8 weeks    PT Treatment/Interventions  Therapeutic exercise;Patient/family education;Balance training;Neuromuscular re-education;Gait training;ADLs/Self Care Home Management;Therapeutic activities;Functional mobility training;Manual techniques;Passive range of motion;Scar mobilization  PT Next Visit Plan  Cont Nustep or bike as able.  R shoulder PROM in to abduction, Suggest more emphasis on functional/body weight strengthening such as sit to/from stand, step-ups, sidesteps, etc.; review HEP prn and LE strength and balance activities, encouraging pt throughout session.     PT Home Exercise Plan  Seated and standing LE exercises, use rollator for ambulation    Consulted and Agree with Plan of Care  Patient       Patient will benefit from skilled therapeutic intervention in order to improve the following deficits and impairments:  Postural dysfunction, Decreased knowledge of precautions, Pain, Impaired UE functional use, Decreased range of motion, Difficulty walking, Decreased activity tolerance, Decreased strength  Visit Diagnosis: Muscle weakness (generalized)  Difficulty in walking, not elsewhere classified  Abnormal posture  Stiffness of right shoulder, not elsewhere classified     Problem List Patient Active Problem List   Diagnosis Date Noted  . Insomnia 10/07/2017  . Dehydration 10/07/2017  . Port-A-Cath in place 09/30/2017  . Genetic testing 06/27/2017  . Family history of breast cancer   . Malignant  neoplasm of upper-inner quadrant of right breast in female, estrogen receptor positive ([place]) 06/26/2017    Allyson Sabal [facility] 12/09/2017, 5:09 PM  [place], [place], [address] Phone: [phone]   Fax:  [phone]  Name: Gloria Ware MRN: [id number] Date of Birth: [birth year]  Manus Gunning, PT 12/09/17 5:09 PM

## 2017-12-10 ENCOUNTER — Ambulatory Visit
Admission: RE | Admit: 2017-12-10 | Discharge: 2017-12-10 | Disposition: A | Discharge status: Home / Self Care | Payer: Medicare Other | Source: Ambulatory Visit | Attending: Radiation Oncology | Admitting: Radiation Oncology

## 2017-12-10 DIAGNOSIS — Z170 Estrogen receptor positive status [ER+]: Secondary | ICD-10-CM | POA: Diagnosis not present

## 2017-12-10 DIAGNOSIS — C50211 Malignant neoplasm of upper-inner quadrant of right female breast: Secondary | ICD-10-CM | POA: Diagnosis not present

## 2017-12-10 DIAGNOSIS — Z510 Encounter for antineoplastic radiation therapy: Secondary | ICD-10-CM | POA: Diagnosis not present

## 2017-12-10 DIAGNOSIS — F411 Generalized anxiety disorder: Secondary | ICD-10-CM | POA: Diagnosis not present

## 2017-12-11 ENCOUNTER — Ambulatory Visit: Payer: Medicare Other

## 2017-12-11 ENCOUNTER — Ambulatory Visit
Admission: RE | Admit: 2017-12-11 | Discharge: 2017-12-11 | Disposition: A | Discharge status: Home / Self Care | Payer: Medicare Other | Source: Ambulatory Visit | Attending: Radiation Oncology | Admitting: Radiation Oncology

## 2017-12-11 DIAGNOSIS — M6281 Muscle weakness (generalized): Secondary | ICD-10-CM

## 2017-12-11 DIAGNOSIS — C50211 Malignant neoplasm of upper-inner quadrant of right female breast: Secondary | ICD-10-CM | POA: Diagnosis not present

## 2017-12-11 DIAGNOSIS — R293 Abnormal posture: Secondary | ICD-10-CM | POA: Diagnosis not present

## 2017-12-11 DIAGNOSIS — R262 Difficulty in walking, not elsewhere classified: Secondary | ICD-10-CM | POA: Diagnosis not present

## 2017-12-11 DIAGNOSIS — M25611 Stiffness of right shoulder, not elsewhere classified: Secondary | ICD-10-CM | POA: Diagnosis not present

## 2017-12-11 DIAGNOSIS — Z510 Encounter for antineoplastic radiation therapy: Secondary | ICD-10-CM | POA: Diagnosis not present

## 2017-12-11 DIAGNOSIS — Z170 Estrogen receptor positive status [ER+]: Secondary | ICD-10-CM | POA: Diagnosis not present

## 2017-12-11 NOTE — Patient Instructions (Signed)
Lunge    Place hands on wall. Inhale and step forward onto right leg while bending left knee and lifting left heel off the floor. Exhaling step back, bringing legs back together. Repeat with left leg forward. Repeat _2-3__ times, alternating legs, holding 10-20 seconds. Do _2-3__ times per day.  Cancer Rehab [phone]

## 2017-12-11 NOTE — Therapy (Signed)
[place], [place], [address] Phone: [phone]   Fax:  [phone]  Physical Therapy Treatment  Patient Details  Name: Gloria Ware MRN: [id number] Date of Birth: Jun 14, 1946 Referring Provider: Dr. [name]   Encounter Date: 12/11/2017  PT End of Session - 12/11/17 1503    Visit Number  18    Number of Visits  29    Date for PT Re-Evaluation  01/01/18    PT Start Time  6962    PT Stop Time  1433    PT Time Calculation (min)  44 min    Activity Tolerance  Patient tolerated treatment well    Behavior During Therapy  [facility] for tasks assessed/performed       Past Medical History:  Diagnosis Date  . Anxiety   . Breast CA ([place])   . Colon polyp 09/12/2005  . Depression   . Eczema    right forearm   . Family history of breast cancer   . Genetic testing 06/27/2017   Multi-Cancer panel (83 genes) @ [facility] - No pathogenic mutations detected  . Hearing loss   . High cholesterol   . Injury     left foot metatarsal fracture sustined 08-03-17 , treated with boot; patient  reports  improvement as of today  . OCD (obsessive compulsive disorder)   . Osteopenia 11/2016   T score -2.1 stable from prior DEXA  . PONV (postoperative nausea and vomiting)    post op nausea  . Tinnitus     Past Surgical History:  Procedure Laterality Date  . CARDIAC CATHETERIZATION     husband was having symtpoms and needed a stent; subsequently she states she started " to feel something"  but thinks it was " all in my head bc of my hbsand". subsequently saw same cardiologist as her husband who sent for treadmill stress that had inconcluisicve results so she was sent for cardiac catherization which she states was " definitely negative "   . cataract surg    . DILATION AND CURETTAGE OF UTERUS    . IR CV LINE INJECTION  09/30/2017  . MASTECTOMY W/ SENTINEL NODE BIOPSY Right 08/06/2017   Procedure: RIGHT MASTECTOMY WITH  SENTINEL LYMPH NODE BIOPSY;  Surgeon: [name], MD;  Location: [place];  Service: General;  Laterality: Right;  . MOUTH SURGERY    . PORTACATH PLACEMENT N/A 09/12/2017   Procedure: INSERTION PORT-A-CATH;  Surgeon: [name], MD;  Location: [facility];  Service: General;  Laterality: N/A;  . RADIOACTIVE SEED GUIDED AXILLARY SENTINEL LYMPH NODE Right 08/06/2017   Procedure: SEED TARGETED AXILLARY LYMPH NODE EXCISION;  Surgeon: [name], MD;  Location: [facility];  Service: General;  Laterality: Right;    There were no vitals filed for this visit.  Subjective Assessment - 12/11/17 1358    Subjective  I'm continuing to do more walking as able indoors, outdoors when the weather permits. And working on the [place].     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 It measured 1 cm and is located in the upper inner quadrant. 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 She underwent a right mastectomy and sentinel node biopsy on 08/06/17. She is wearing a boot on her left foot due to a fall and foot  fracture 08/04/17. Her orthopedist for her foot is Dr. Doran Durand.     Patient Stated Goals  Make sure my arm is ok and that I'm strong enough for chemotherapy.    Currently in Pain?  No/denies                      [facility] Adult PT Treatment/Exercise - 12/11/17 0001      Knee/Hip Exercises: Stretches   Hip Flexor Stretch  Right;Left;2 reps;10 seconds    Gastroc Stretch  Right;Left;2 reps;10 seconds      Knee/Hip Exercises: Aerobic   Stationary Bike  No resistance x9 mins- encouraged pt to do a faster pace to try and keep the screen on by using metronome set to 70, this helped but pt still slows secondary to fatiuge      Knee/Hip Exercises: Seated   Long Arc Quad  Strengthening;Right;Left;20  reps;Weights 3-5 sec holds    Long [employer] Weight  2 lbs.    Ball Squeeze  2 sets of 10 with 5 sec holds    Marching  Strengthening;Right;Left;20 reps;Weights      Shoulder Exercises: Seated   Flexion  AAROM;Right;5 reps with dowel, 5 sec holds    Abduction  AAROM;Right;5 reps;Limitations with dowel, 5 sec holds    ABduction Limitations  Pt required multiple demonstrations and encouragement that she was doing it correctly      Shoulder Exercises: Pulleys   Flexion  2 minutes    ABduction  2 minutes      Shoulder Exercises: Therapy Ball   Flexion  10 reps;Limitations with stretch at top    Flexion Limitations  Demonstration and encouragemnt for correct techique throughout             PT Education - 12/11/17 1502    Education provided  Yes    Education Details  Lunge stretch at wall for gastroc and hip flexor stretch    Person(s) Educated  Patient    Methods  Explanation;Demonstration;Handout    Comprehension  Verbalized understanding;Returned demonstration           Breast Clinic Goals - 06/26/17 2119      Patient will be able to verbalize understanding of pertinent lymphedema risk reduction practices relevant to her diagnosis specifically related to skin care.   Time  1    Period  Days    Status  Achieved      Patient will be able to return demonstrate and/or verbalize understanding of the post-op home exercise program related to regaining shoulder range of motion.   Time  1    Period  Days    Status  Achieved      Patient will be able to verbalize understanding of the importance of attending the postoperative After Breast Cancer Class for further lymphedema risk reduction education and therapeutic exercise.   Time  1    Period  Days    Status  Achieved       Long Term Clinic Goals - 12/04/17 1354      CC Long Term Goal  #1   Title  Patient will perform home exercise program correctly and independently.    Time  8    Period  Weeks    Status  On-going       CC Long Term Goal  #2   Title  Patient will demonstrate right shoulder active flexion at >/= 130 degrees for increased ease reaching overhead.    Baseline  111  degrees today, 11/01/17- 125 degrees, 12/04/17- 132 degrees    Time  8    Period  Weeks    Status  Achieved      CC Long Term Goal  #3   Title  Patient will demonstrate right shoulder active abduction at >/= 137 degrees for increased ease reaching overhead.    Baseline  125 degrees today, 11/01/17- 121 degrees, 12/04/17- 115 degrees    Time  8    Period  Weeks    Status  On-going      CC Long Term Goal  #4   Title  Patient will report she is able to participate in a regular walking program for 30 minutes, 5-6 days per week for reducing recurrence risk and better tolerance of chemotherapy.    Baseline  Unable to walk long distance with boot on., 11/01/17- pt has been unable to walk due to shakiness, 12/04/17- pt now has a rollator and can walk around in her house with it, she has just started walking outside since her shakiness is more controlled    Time  8    Period  Weeks    Status  On-going      CC Long Term Goal  #5   Title  Patient will verbalize understanding of risk reduction practices related to lymphedema.    Baseline  11/01/17- pt required some cueing for this, 12/04/17- pt able to verbalize this    Time  8    Period  Weeks    Status  Achieved      CC Long Term Goal  #6   Title  Quick DASH score reduced to </= 4 for improved function of right arm.    Time  8    Period  Weeks    Status  On-going      CC Long Term Goal  #7   Title  Pt will be able to sit to stand 12x in 30 seconds to decrease risk of falls    Baseline  6    Time  4    Period  Weeks    Status  New    Target Date  01/01/18      CC Long Term Goal  #8   Title  Pt to be able to complete TUG in less than 13 seconds without use of UEs to decrease risk of falls    Baseline  25 seconds without use of UEs    Time  4    Period  Weeks    Status  New     Target Date  01/01/18      Additional Goals   Additional Goals  Yes         Plan - 12/11/17 1503    Clinical Impression Statement  Pt continues with excellent progress with her endurance. She reports is working on walking more around house and when out in community. She reports is feeling more confident each day as she continues to feel stronger. Was able to progress to another 2 mins on bike though does cont to require encouragement for consistent pace as she is unable to do long with resistance yet.     Rehab Potential  Excellent    Clinical Impairments Affecting Rehab Potential  Recently healed Lt foot fracture; started chemo 09/16/17 but has now switched to Taxol due to poor tolerance/decreased WBC=pt very weak; pt no longer receiving chemo, pt currently receiving radiation    PT Frequency  2x / week  PT Duration  8 weeks    PT Treatment/Interventions  Therapeutic exercise;Patient/family education;Balance training;Neuromuscular re-education;Gait training;ADLs/Self Care Home Management;Therapeutic activities;Functional mobility training;Manual techniques;Passive range of motion;Scar mobilization    PT Next Visit Plan  Cont Nustep or bike as able.  R shoulder PROM in to abduction, Suggest more emphasis on functional/body weight strengthening such as sit to/from stand, step-ups, sidesteps, etc.; review HEP prn and LE strength and balance activities, encouraging pt throughout session.     Consulted and Agree with Plan of Care  Patient       Patient will benefit from skilled therapeutic intervention in order to improve the following deficits and impairments:  Postural dysfunction, Decreased knowledge of precautions, Pain, Impaired UE functional use, Decreased range of motion, Difficulty walking, Decreased activity tolerance, Decreased strength  Visit Diagnosis: Muscle weakness (generalized)  Difficulty in walking, not elsewhere classified  Abnormal posture  Stiffness of right shoulder,  not elsewhere classified     Problem List Patient Active Problem List   Diagnosis Date Noted  . Insomnia 10/07/2017  . Dehydration 10/07/2017  . Port-A-Cath in place 09/30/2017  . Genetic testing 06/27/2017  . Family history of breast cancer   . Malignant neoplasm of upper-inner quadrant of right breast in female, estrogen receptor positive ([place]) 06/26/2017    Otelia Limes, PTA 12/11/2017, 3:06 PM  [place], [place], [address] Phone: [phone]   Fax:  [phone]  Name: Gloria Ware MRN: [id number] Date of Birth: [birth year]

## 2017-12-12 ENCOUNTER — Ambulatory Visit
Admission: RE | Admit: 2017-12-12 | Discharge: 2017-12-12 | Disposition: A | Discharge status: Home / Self Care | Payer: Medicare Other | Source: Ambulatory Visit | Attending: Radiation Oncology | Admitting: Radiation Oncology

## 2017-12-12 DIAGNOSIS — Z170 Estrogen receptor positive status [ER+]: Secondary | ICD-10-CM | POA: Diagnosis not present

## 2017-12-12 DIAGNOSIS — C50211 Malignant neoplasm of upper-inner quadrant of right female breast: Secondary | ICD-10-CM | POA: Diagnosis not present

## 2017-12-12 DIAGNOSIS — Z510 Encounter for antineoplastic radiation therapy: Secondary | ICD-10-CM | POA: Diagnosis not present

## 2017-12-13 ENCOUNTER — Telehealth: Payer: Self-pay

## 2017-12-13 ENCOUNTER — Ambulatory Visit
Admission: RE | Admit: 2017-12-13 | Discharge: 2017-12-13 | Disposition: A | Discharge status: Home / Self Care | Payer: Medicare Other | Source: Ambulatory Visit | Attending: Radiation Oncology | Admitting: Radiation Oncology

## 2017-12-13 DIAGNOSIS — Z510 Encounter for antineoplastic radiation therapy: Secondary | ICD-10-CM | POA: Diagnosis not present

## 2017-12-13 DIAGNOSIS — Z170 Estrogen receptor positive status [ER+]: Secondary | ICD-10-CM | POA: Diagnosis not present

## 2017-12-13 DIAGNOSIS — C50211 Malignant neoplasm of upper-inner quadrant of right female breast: Secondary | ICD-10-CM | POA: Diagnosis not present

## 2017-12-13 NOTE — Telephone Encounter (Signed)
Received call from pt regarding an appt she has scheduled with Dr Lindi Adie on March 18 th but she is presently having appts for radiation and thought she would see Dr Lindi Adie toward the end of radiation treatments.  Confirmed this with pt and will send scheduling an inbasket to reschedule her 3-18 appt for the week of 01-13-2018 which is her last appt for radiation.  Pt prefers April 9,10,11, or 12th as she will have her port removed on April 15th.  No other needs per pt at this time.

## 2017-12-16 ENCOUNTER — Ambulatory Visit: Payer: Medicare Other

## 2017-12-16 ENCOUNTER — Ambulatory Visit: Payer: Medicare Other | Admitting: Physical Therapy

## 2017-12-16 ENCOUNTER — Encounter: Payer: Self-pay | Admitting: Physical Therapy

## 2017-12-16 ENCOUNTER — Ambulatory Visit
Admission: RE | Admit: 2017-12-16 | Discharge: 2017-12-16 | Disposition: A | Discharge status: Home / Self Care | Payer: Medicare Other | Source: Ambulatory Visit | Attending: Radiation Oncology | Admitting: Radiation Oncology

## 2017-12-16 ENCOUNTER — Other Ambulatory Visit: Payer: Medicare Other

## 2017-12-16 DIAGNOSIS — Z510 Encounter for antineoplastic radiation therapy: Secondary | ICD-10-CM | POA: Diagnosis not present

## 2017-12-16 DIAGNOSIS — M25611 Stiffness of right shoulder, not elsewhere classified: Secondary | ICD-10-CM

## 2017-12-16 DIAGNOSIS — Z170 Estrogen receptor positive status [ER+]: Secondary | ICD-10-CM | POA: Diagnosis not present

## 2017-12-16 DIAGNOSIS — M6281 Muscle weakness (generalized): Secondary | ICD-10-CM

## 2017-12-16 DIAGNOSIS — R262 Difficulty in walking, not elsewhere classified: Secondary | ICD-10-CM

## 2017-12-16 DIAGNOSIS — R293 Abnormal posture: Secondary | ICD-10-CM

## 2017-12-16 DIAGNOSIS — C50211 Malignant neoplasm of upper-inner quadrant of right female breast: Secondary | ICD-10-CM | POA: Diagnosis not present

## 2017-12-16 NOTE — Patient Instructions (Signed)
Strengthening: Resisted Extension    Hold tubing in both hands, arms forward. Pull arm back, elbow straight. Repeat __5-10__ times per set. Do __1-2__ sessions per day.   Then do it with alternating arms,  Then do with with both hands at side,pinch shoulder blades  Wall Taps Stand about 12 inches away from wall, facing away from wall,, reach hips to touch wall bending knees, then pop up to straghten up!

## 2017-12-16 NOTE — Therapy (Signed)
[place], [place], [address] Phone: [phone]   Fax:  [phone]  Physical Therapy Treatment  Patient Details  Name: Gloria Ware MRN: [id number] Date of Birth: [birth year] Referring Provider: Dr. [name]   Encounter Date: 12/16/2017  PT End of Session - 12/16/17 1605    Visit Number  19    Number of Visits  29    Date for PT Re-Evaluation  01/01/18    PT Start Time  1311 pt started late, had to use rest room     PT Stop Time  1345    PT Time Calculation (min)  34 min    Activity Tolerance  Patient tolerated treatment well    Behavior During Therapy  [facility] for tasks assessed/performed       Past Medical History:  Diagnosis Date  . Anxiety   . Breast CA ([place])   . Colon polyp 09/12/2005  . Depression   . Eczema    right forearm   . Family history of breast cancer   . Genetic testing 06/27/2017   Multi-Cancer panel (83 genes) @ [facility] - No pathogenic mutations detected  . Hearing loss   . High cholesterol   . Injury     left foot metatarsal fracture sustined 08-03-17 , treated with boot; patient  reports  improvement as of today  . OCD (obsessive compulsive disorder)   . Osteopenia 11/2016   T score -2.1 stable from prior DEXA  . PONV (postoperative nausea and vomiting)    post op nausea  . Tinnitus     Past Surgical History:  Procedure Laterality Date  . CARDIAC CATHETERIZATION     husband was having symtpoms and needed a stent; subsequently she states she started " to feel something"  but thinks it was " all in my head bc of my hbsand". subsequently saw same cardiologist as her husband who sent for treadmill stress that had inconcluisicve results so she was sent for cardiac catherization which she states was " definitely negative "   . cataract surg    . DILATION AND CURETTAGE OF UTERUS    . IR CV LINE INJECTION  09/30/2017  . MASTECTOMY W/ SENTINEL NODE BIOPSY Right  08/06/2017   Procedure: RIGHT MASTECTOMY WITH SENTINEL LYMPH NODE BIOPSY;  Surgeon: [name], MD;  Location: [facility];  Service: General;  Laterality: Right;  . MOUTH SURGERY    . PORTACATH PLACEMENT N/A 09/12/2017   Procedure: INSERTION PORT-A-CATH;  Surgeon: [name], MD;  [facility];  Service: General;  Laterality: N/A;  . RADIOACTIVE SEED GUIDED AXILLARY SENTINEL LYMPH NODE Right 08/06/2017   Procedure: SEED TARGETED AXILLARY LYMPH NODE EXCISION;  Surgeon: [name], MD;  Location: [facility];  Service: General;  Laterality: Right;    There were no vitals filed for this visit.  Subjective Assessment - 12/16/17 1311    Subjective  I go to radiation after this , Pt also reports she has a tremor in her legs that she can voluntarily stop temprorarily that she thinks is from chemo . She has problems with carrying her cello and wants to be able to carry it onstage.     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 It measured 1 cm and is located in the upper inner quadrant. She also has  a fairly large lipoma present on her right superior shoulder. She underwent a right mastectomy and sentinel node biopsy on 08/06/17. She is wearing a boot on her left foot due to a fall and foot fracture 08/04/17. Her orthopedist for her foot is Dr. Doran Durand.     Patient Stated Goals  Make sure my arm is ok and that I'm strong enough for chemotherapy.    Currently in Pain?  No/denies                      [facility] Adult PT Treatment/Exercise - 12/16/17 0001      Knee/Hip Exercises: Aerobic   Stationary Bike  No resistance x5  mins- encouraged pt to keep a faster pace, but she still had trouble and fatigued early       Knee/Hip Exercises: Standing   Heel Raises  5 reps    Lateral Step Up  Right;Left;10 reps;Step Height: 6" 6"  step inside treadmill so pt could use both handrails     Forward Step Up  Right;Left;10 reps;Step Height: 6"    Step Down  Right;Left;Step Height: 6"    Functional Squat  1 set;10 reps      Shoulder Exercises: Standing   Row  Strengthening;Right;Left;20 reps;Theraband 10 reps with both arms, 10 reps alternating     Theraband Level (Shoulder Row)  Level 2 (Red)    Retraction  Strengthening;Right;Left;10 reps;Theraband red theraband with hands toward hips     Theraband Level (Shoulder Retraction)  Level 2 (Red)      Shoulder Exercises: Pulleys   Flexion  2 minutes    ABduction  2 minutes             PT Education - 12/16/17 1604    Education provided  Yes    Education Details  standing rows with theraband and wall taps     Person(s) Educated  Patient    Methods  Explanation;Demonstration;Handout    Comprehension  Returned demonstration           Breast Clinic Goals - 06/26/17 2119      Patient will be able to verbalize understanding of pertinent lymphedema risk reduction practices relevant to her diagnosis specifically related to skin care.   Time  1    Period  Days    Status  Achieved      Patient will be able to return demonstrate and/or verbalize understanding of the post-op home exercise program related to regaining shoulder range of motion.   Time  1    Period  Days    Status  Achieved      Patient will be able to verbalize understanding of the importance of attending the postoperative After Breast Cancer Class for further lymphedema risk reduction education and therapeutic exercise.   Time  1    Period  Days    Status  Achieved       Long Term Clinic Goals - 12/04/17 1354      CC Long Term Goal  #1   Title  Patient will perform home exercise program correctly and independently.    Time  8    Period  Weeks    Status  On-going      CC Long Term Goal  #2   Title  Patient will demonstrate right shoulder active flexion at >/= 130 degrees for increased  ease reaching overhead.    Baseline  111 degrees today, 11/01/17- 125 degrees, 12/04/17- 132 degrees  Time  8    Period  Weeks    Status  Achieved      CC Long Term Goal  #3   Title  Patient will demonstrate right shoulder active abduction at >/= 137 degrees for increased ease reaching overhead.    Baseline  125 degrees today, 11/01/17- 121 degrees, 12/04/17- 115 degrees    Time  8    Period  Weeks    Status  On-going      CC Long Term Goal  #4   Title  Patient will report she is able to participate in a regular walking program for 30 minutes, 5-6 days per week for reducing recurrence risk and better tolerance of chemotherapy.    Baseline  Unable to walk long distance with boot on., 11/01/17- pt has been unable to walk due to shakiness, 12/04/17- pt now has a rollator and can walk around in her house with it, she has just started walking outside since her shakiness is more controlled    Time  8    Period  Weeks    Status  On-going      CC Long Term Goal  #5   Title  Patient will verbalize understanding of risk reduction practices related to lymphedema.    Baseline  11/01/17- pt required some cueing for this, 12/04/17- pt able to verbalize this    Time  8    Period  Weeks    Status  Achieved      CC Long Term Goal  #6   Title  Quick DASH score reduced to </= 4 for improved function of right arm.    Time  8    Period  Weeks    Status  On-going      CC Long Term Goal  #7   Title  Pt will be able to sit to stand 12x in 30 seconds to decrease risk of falls    Baseline  6    Time  4    Period  Weeks    Status  New    Target Date  01/01/18      CC Long Term Goal  #8   Title  Pt to be able to complete TUG in less than 13 seconds without use of UEs to decrease risk of falls    Baseline  25 seconds without use of UEs    Time  4    Period  Weeks    Status  New    Target Date  01/01/18      Additional Goals   Additional Goals  Yes         Plan - 12/16/17 1605    Clinical  Impression Statement  Pt was bothered by tremors in her LEs and UEs today , but was able to control them some with mental effort and has less tremor after exrecise.  She may have some anxiety issues.   She responded well to step up and down strengthening exericse.     Clinical Impairments Affecting Rehab Potential  Recently healed Lt foot fracture; started chemo 09/16/17 but has now switched to Taxol due to poor tolerance/decreased WBC=pt very weak; pt no longer receiving chemo, pt currently receiving radiation    PT Frequency  2x / week    PT Duration  8 weeks    PT Treatment/Interventions  Therapeutic exercise;Patient/family education;Balance training;Neuromuscular re-education;Gait training;ADLs/Self Care Home Management;Therapeutic activities;Functional mobility training;Manual techniques;Passive range of motion;Scar mobilization    PT Next Visit Plan  try nustep as pt is having trouble with bike. work on step ups, SLS in parallell bars ,  Also  R shoulder PROM in to abduction, Suggest more emphasis on functional/body weight strengthening such as sit to/from stand, step-ups, sidesteps, etc.; review HEP prn and LE strength and balance activities, encouraging pt throughout session.     PT Home Exercise Plan  Seated and standing LE exercises, use rollator for ambulation    Consulted and Agree with Plan of Care  Patient       Patient will benefit from skilled therapeutic intervention in order to improve the following deficits and impairments:  Postural dysfunction, Decreased knowledge of precautions, Pain, Impaired UE functional use, Decreased range of motion, Difficulty walking, Decreased activity tolerance, Decreased strength  Visit Diagnosis: Muscle weakness (generalized)  Difficulty in walking, not elsewhere classified  Abnormal posture  Stiffness of right shoulder, not elsewhere classified     Problem List Patient Active Problem List   Diagnosis Date Noted  . Insomnia 10/07/2017  .  Dehydration 10/07/2017  . Port-A-Cath in place 09/30/2017  . Genetic testing 06/27/2017  . Family history of breast cancer   . Malignant neoplasm of upper-inner quadrant of right breast in female, estrogen receptor positive ([place]) 06/26/2017   Donato Heinz. Owens Shark PT  Norwood Levo 12/16/2017, 4:22 PM  [place], [place], [address] Phone: [phone]   Fax:  [phone]  Name: Gloria Ware MRN: [id number] Date of Birth: 09/10/1946

## 2017-12-17 ENCOUNTER — Ambulatory Visit
Admission: RE | Admit: 2017-12-17 | Discharge: 2017-12-17 | Disposition: A | Discharge status: Home / Self Care | Payer: Medicare Other | Source: Ambulatory Visit | Attending: Radiation Oncology | Admitting: Radiation Oncology

## 2017-12-17 DIAGNOSIS — C50211 Malignant neoplasm of upper-inner quadrant of right female breast: Secondary | ICD-10-CM | POA: Diagnosis not present

## 2017-12-17 DIAGNOSIS — Z510 Encounter for antineoplastic radiation therapy: Secondary | ICD-10-CM | POA: Diagnosis not present

## 2017-12-17 DIAGNOSIS — Z170 Estrogen receptor positive status [ER+]: Secondary | ICD-10-CM | POA: Diagnosis not present

## 2017-12-18 ENCOUNTER — Ambulatory Visit
Admission: RE | Admit: 2017-12-18 | Discharge: 2017-12-18 | Disposition: A | Discharge status: Home / Self Care | Payer: Medicare Other | Source: Ambulatory Visit | Attending: Radiation Oncology | Admitting: Radiation Oncology

## 2017-12-18 ENCOUNTER — Ambulatory Visit: Payer: Medicare Other

## 2017-12-18 DIAGNOSIS — Z510 Encounter for antineoplastic radiation therapy: Secondary | ICD-10-CM | POA: Diagnosis not present

## 2017-12-18 DIAGNOSIS — R293 Abnormal posture: Secondary | ICD-10-CM | POA: Diagnosis not present

## 2017-12-18 DIAGNOSIS — M6281 Muscle weakness (generalized): Secondary | ICD-10-CM | POA: Diagnosis not present

## 2017-12-18 DIAGNOSIS — R262 Difficulty in walking, not elsewhere classified: Secondary | ICD-10-CM | POA: Diagnosis not present

## 2017-12-18 DIAGNOSIS — Z170 Estrogen receptor positive status [ER+]: Secondary | ICD-10-CM | POA: Diagnosis not present

## 2017-12-18 DIAGNOSIS — C50211 Malignant neoplasm of upper-inner quadrant of right female breast: Secondary | ICD-10-CM | POA: Diagnosis not present

## 2017-12-18 DIAGNOSIS — M25611 Stiffness of right shoulder, not elsewhere classified: Secondary | ICD-10-CM | POA: Diagnosis not present

## 2017-12-18 NOTE — Therapy (Signed)
[name], [place], [address] Phone: [phone]   Fax:  [phone]  Physical Therapy Treatment  Patient Details  Name: Gloria Ware MRN: [id number] Date of Birth: [birth year] Referring Provider: Dr. [name]   Encounter Date: 12/18/2017  PT End of Session - 12/18/17 1428    Visit Number  20    Number of Visits  29    Date for PT Re-Evaluation  01/01/18    PT Start Time  1353 Pt arrived late    PT Stop Time  1432    PT Time Calculation (min)  39 min    Activity Tolerance  Patient tolerated treatment well    Behavior During Therapy  [facility] for tasks assessed/performed       Past Medical History:  Diagnosis Date  . Anxiety   . Breast CA ([place])   . Colon polyp 09/12/2005  . Depression   . Eczema    right forearm   . Family history of breast cancer   . Genetic testing 06/27/2017   Multi-Cancer panel (83 genes) @ [facility] - No pathogenic mutations detected  . Hearing loss   . High cholesterol   . Injury     left foot metatarsal fracture sustined 08-03-17 , treated with boot; patient  reports  improvement as of today  . OCD (obsessive compulsive disorder)   . Osteopenia 11/2016   T score -2.1 stable from prior DEXA  . PONV (postoperative nausea and vomiting)    post op nausea  . Tinnitus     Past Surgical History:  Procedure Laterality Date  . CARDIAC CATHETERIZATION     husband was having symtpoms and needed a stent; subsequently she states she started " to feel something"  but thinks it was " all in my head bc of my hbsand". subsequently saw same cardiologist as her husband who sent for treadmill stress that had inconcluisicve results so she was sent for cardiac catherization which she states was " definitely negative "   . cataract surg    . DILATION AND CURETTAGE OF UTERUS    . IR CV LINE INJECTION  09/30/2017  . MASTECTOMY W/ SENTINEL NODE BIOPSY Right 08/06/2017   Procedure: RIGHT  MASTECTOMY WITH SENTINEL LYMPH NODE BIOPSY;  Surgeon: [name], MD;  Location: [place];  Service: General;  Laterality: Right;  . MOUTH SURGERY    . PORTACATH PLACEMENT N/A 09/12/2017   Procedure: INSERTION PORT-A-CATH;  Surgeon: [name], MD;  Location[facility];  Service: General;  Laterality: N/A;  . RADIOACTIVE SEED GUIDED AXILLARY SENTINEL LYMPH NODE Right 08/06/2017   Procedure: SEED TARGETED AXILLARY LYMPH NODE EXCISION;  Surgeon: [name], MD;  Location: [place];  Service: General;  Laterality: Right;    There were no vitals filed for this visit.  Subjective Assessment - 12/18/17 1358    Subjective  Radiation is going well, starting to have some redness to my skin but nothing that's bothering me. My tremors are getting better daily I think. 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 It measured 1 cm and is located in the upper inner quadrant. 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 She underwent a right  mastectomy and sentinel node biopsy on 08/06/17. She is wearing a boot on her left foot due to a fall and foot fracture 08/04/17. Her orthopedist for her foot is Dr. Doran Durand.     Patient Stated Goals  Make sure my arm is ok and that I'm strong enough for chemotherapy.    Currently in Pain?  No/denies                      [facility] Adult PT Treatment/Exercise - 12/18/17 0001      Neuro Re-ed    Neuro Re-ed Details   All following done in // bars: Front and retro tandem walking, then slow, high knee marching all done 2 times each way; then front step ups on 4" step and airex 3 sets of x5 each leg; then lateral step ups on 4" with airex x10 each leg with 3 sec holds each time; on Airex toe  raises 2 x 10; partial squats x10.      Knee/Hip Exercises: Aerobic   Nustep  Level 4, x 7 mins Pt reports this more tolerable than the bike      Knee/Hip Exercises: Seated   Long Arc Quad  Strengthening;Right;Left;2 sets;10 reps;Weights    Long Arc Quad Weight  2 lbs.    Ball Squeeze  2 x 10 with purple ball    Other Seated Knee/Hip Exercises  Seated exercises done in circuit    Marching  Strengthening;Right;Left;2 sets;10 reps;Weights    Marching Limitations  2                   Breast Clinic Goals - 06/26/17 2119      Patient will be able to verbalize understanding of pertinent lymphedema risk reduction practices relevant to her diagnosis specifically related to skin care.   Time  1    Period  Days    Status  Achieved      Patient will be able to return demonstrate and/or verbalize understanding of the post-op home exercise program related to regaining shoulder range of motion.   Time  1    Period  Days    Status  Achieved      Patient will be able to verbalize understanding of the importance of attending the postoperative After Breast Cancer Class for further lymphedema risk reduction education and therapeutic exercise.   Time  1    Period  Days    Status  Achieved       Long Term Clinic Goals - 12/04/17 1354      CC Long Term Goal  #1   Title  Patient will perform home exercise program correctly and independently.    Time  8    Period  Weeks    Status  On-going      CC Long Term Goal  #2   Title  Patient will demonstrate right shoulder active flexion at >/= 130 degrees for increased ease reaching overhead.    Baseline  111 degrees today, 11/01/17- 125 degrees, 12/04/17- 132 degrees    Time  8    Period  Weeks    Status  Achieved      CC Long Term Goal  #3   Title  Patient will demonstrate right shoulder active abduction at >/= 137 degrees for increased ease reaching overhead.    Baseline  125 degrees today, 11/01/17- 121 degrees, 12/04/17- 115 degrees     Time  8    Period  Weeks  Status  On-going      CC Long Term Goal  #4   Title  Patient will report she is able to participate in a regular walking program for 30 minutes, 5-6 days per week for reducing recurrence risk and better tolerance of chemotherapy.    Baseline  Unable to walk long distance with boot on., 11/01/17- pt has been unable to walk due to shakiness, 12/04/17- pt now has a rollator and can walk around in her house with it, she has just started walking outside since her shakiness is more controlled    Time  8    Period  Weeks    Status  On-going      CC Long Term Goal  #5   Title  Patient will verbalize understanding of risk reduction practices related to lymphedema.    Baseline  11/01/17- pt required some cueing for this, 12/04/17- pt able to verbalize this    Time  8    Period  Weeks    Status  Achieved      CC Long Term Goal  #6   Title  Quick DASH score reduced to </= 4 for improved function of right arm.    Time  8    Period  Weeks    Status  On-going      CC Long Term Goal  #7   Title  Pt will be able to sit to stand 12x in 30 seconds to decrease risk of falls    Baseline  6    Time  4    Period  Weeks    Status  New    Target Date  01/01/18      CC Long Term Goal  #8   Title  Pt to be able to complete TUG in less than 13 seconds without use of UEs to decrease risk of falls    Baseline  25 seconds without use of UEs    Time  4    Period  Weeks    Status  New    Target Date  01/01/18      Additional Goals   Additional Goals  Yes         Plan - 12/18/17 1428    Clinical Impression Statement  Pt done very well today and with very little tremors. We tried NuStep which she tolerated very well in comparison to the recumbent bike. Also progressed pt to higher level dynamic balance activities which she also tolerated very well, though did require ~3 seated rest breaks and finished last 8-10 mins with seated exercises due to LE fatigue from standing.  Overall pt tolerated progression very well and, again, tremors were very minimal today and only noticeable towards end of session. Pt was very pleased seeing that.      Rehab Potential  Excellent    Clinical Impairments Affecting Rehab Potential  Recently healed Lt foot fracture; started chemo 09/16/17 but has now switched to Taxol due to poor tolerance/decreased WBC=pt very weak; pt no longer receiving chemo, pt currently receiving radiation    PT Frequency  2x / week    PT Duration  8 weeks    PT Treatment/Interventions  Therapeutic exercise;Patient/family education;Balance training;Neuromuscular re-education;Gait training;ADLs/Self Care Home Management;Therapeutic activities;Functional mobility training;Manual techniques;Passive range of motion;Scar mobilization    PT Next Visit Plan  Cont nustep as pt is having trouble with bike. work on step ups, SLS in parallell bars ,  Also  R shoulder PROM in to abduction,  Suggest more emphasis on functional/body weight strengthening such as sit to/from stand, step-ups, sidesteps, etc.; review HEP prn and LE strength and balance activities, encouraging pt throughout session.     Consulted and Agree with Plan of Care  Patient       Patient will benefit from skilled therapeutic intervention in order to improve the following deficits and impairments:  Postural dysfunction, Decreased knowledge of precautions, Pain, Impaired UE functional use, Decreased range of motion, Difficulty walking, Decreased activity tolerance, Decreased strength  Visit Diagnosis: Muscle weakness (generalized)  Difficulty in walking, not elsewhere classified  Abnormal posture     Problem List Patient Active Problem List   Diagnosis Date Noted  . Insomnia 10/07/2017  . Dehydration 10/07/2017  . Port-A-Cath in place 09/30/2017  . Genetic testing 06/27/2017  . Family history of breast cancer   . Malignant neoplasm of upper-inner quadrant of right breast in female, estrogen  receptor positive ([place]) 06/26/2017    Otelia Limes, PTA 12/18/2017, 2:40 PM  [place], [place], [address] Phone: [phone]   Fax:  [phone]  Name: Gloria Ware MRN: [id number] Date of Birth: 1945-10-18

## 2017-12-19 ENCOUNTER — Ambulatory Visit
Admission: RE | Admit: 2017-12-19 | Discharge: 2017-12-19 | Disposition: A | Discharge status: Home / Self Care | Payer: Medicare Other | Source: Ambulatory Visit | Attending: Radiation Oncology | Admitting: Radiation Oncology

## 2017-12-19 DIAGNOSIS — Z510 Encounter for antineoplastic radiation therapy: Secondary | ICD-10-CM | POA: Diagnosis not present

## 2017-12-19 DIAGNOSIS — Z170 Estrogen receptor positive status [ER+]: Secondary | ICD-10-CM | POA: Diagnosis not present

## 2017-12-19 DIAGNOSIS — C50211 Malignant neoplasm of upper-inner quadrant of right female breast: Secondary | ICD-10-CM

## 2017-12-19 MED ORDER — RADIAPLEXRX EX GEL
Freq: Once | CUTANEOUS | Status: AC
  Administered 2017-12-19: 16:00:00 via TOPICAL

## 2017-12-20 ENCOUNTER — Ambulatory Visit
Admission: RE | Admit: 2017-12-20 | Discharge: 2017-12-20 | Disposition: A | Discharge status: Home / Self Care | Payer: Medicare Other | Source: Ambulatory Visit | Attending: Radiation Oncology | Admitting: Radiation Oncology

## 2017-12-20 DIAGNOSIS — Z510 Encounter for antineoplastic radiation therapy: Secondary | ICD-10-CM | POA: Diagnosis not present

## 2017-12-20 DIAGNOSIS — Z170 Estrogen receptor positive status [ER+]: Secondary | ICD-10-CM | POA: Diagnosis not present

## 2017-12-20 DIAGNOSIS — C50211 Malignant neoplasm of upper-inner quadrant of right female breast: Secondary | ICD-10-CM | POA: Diagnosis not present

## 2017-12-23 ENCOUNTER — Ambulatory Visit
Admission: RE | Admit: 2017-12-23 | Discharge: 2017-12-23 | Disposition: A | Discharge status: Home / Self Care | Payer: Medicare Other | Source: Ambulatory Visit | Attending: Radiation Oncology | Admitting: Radiation Oncology

## 2017-12-23 ENCOUNTER — Ambulatory Visit: Payer: Medicare Other | Admitting: Hematology and Oncology

## 2017-12-23 ENCOUNTER — Ambulatory Visit: Payer: Medicare Other | Admitting: Physical Therapy

## 2017-12-23 ENCOUNTER — Ambulatory Visit: Payer: Medicare Other

## 2017-12-23 ENCOUNTER — Other Ambulatory Visit: Payer: Medicare Other

## 2017-12-23 DIAGNOSIS — R262 Difficulty in walking, not elsewhere classified: Secondary | ICD-10-CM

## 2017-12-23 DIAGNOSIS — Z170 Estrogen receptor positive status [ER+]: Secondary | ICD-10-CM | POA: Diagnosis not present

## 2017-12-23 DIAGNOSIS — M6281 Muscle weakness (generalized): Secondary | ICD-10-CM

## 2017-12-23 DIAGNOSIS — C50211 Malignant neoplasm of upper-inner quadrant of right female breast: Secondary | ICD-10-CM | POA: Diagnosis not present

## 2017-12-23 DIAGNOSIS — R293 Abnormal posture: Secondary | ICD-10-CM | POA: Diagnosis not present

## 2017-12-23 DIAGNOSIS — Z510 Encounter for antineoplastic radiation therapy: Secondary | ICD-10-CM | POA: Diagnosis not present

## 2017-12-23 DIAGNOSIS — M25611 Stiffness of right shoulder, not elsewhere classified: Secondary | ICD-10-CM

## 2017-12-23 NOTE — Therapy (Signed)
[place], [place], [address] Phone: [phone]   Fax:  [phone]  Physical Therapy Treatment  Patient Details  Name: Gloria Ware MRN: [id number] Date of Birth: 1946/06/09 Referring Provider: Dr. [name]   Encounter Date: 12/23/2017  PT End of Session - 12/23/17 1713    Visit Number  21    Number of Visits  29    Date for PT Re-Evaluation  01/01/18    PT Start Time  0938    PT Stop Time  1344    PT Time Calculation (min)  39 min    Activity Tolerance  Patient tolerated treatment well    Behavior During Therapy  [facility] for tasks assessed/performed       Past Medical History:  Diagnosis Date  . Anxiety   . Breast CA ([place])   . Colon polyp 09/12/2005  . Depression   . Eczema    right forearm   . Family history of breast cancer   . Genetic testing 06/27/2017   Multi-Cancer panel (83 genes) @ [facility] - No pathogenic mutations detected  . Hearing loss   . High cholesterol   . Injury     left foot metatarsal fracture sustined 08-03-17 , treated with boot; patient  reports  improvement as of today  . OCD (obsessive compulsive disorder)   . Osteopenia 11/2016   T score -2.1 stable from prior DEXA  . PONV (postoperative nausea and vomiting)    post op nausea  . Tinnitus     Past Surgical History:  Procedure Laterality Date  . CARDIAC CATHETERIZATION     husband was having symtpoms and needed a stent; subsequently she states she started " to feel something"  but thinks it was " all in my head bc of my hbsand". subsequently saw same cardiologist as her husband who sent for treadmill stress that had inconcluisicve results so she was sent for cardiac catherization which she states was " definitely negative "   . cataract surg    . DILATION AND CURETTAGE OF UTERUS    . IR CV LINE INJECTION  09/30/2017  . MASTECTOMY W/ SENTINEL NODE BIOPSY Right 08/06/2017   Procedure: RIGHT MASTECTOMY WITH  SENTINEL LYMPH NODE BIOPSY;  Surgeon: [name], MD;  Location: [place];  Service: General;  Laterality: Right;  . MOUTH SURGERY    . PORTACATH PLACEMENT N/A 09/12/2017   Procedure: INSERTION PORT-A-CATH;  Surgeon: [name], MD;  [facility];  Service: General;  Laterality: N/A;  . RADIOACTIVE SEED GUIDED AXILLARY SENTINEL LYMPH NODE Right 08/06/2017   Procedure: SEED TARGETED AXILLARY LYMPH NODE EXCISION;  Surgeon: [name], MD;  Location: [place];  Service: General;  Laterality: Right;    There were no vitals filed for this visit.  Subjective Assessment - 12/23/17 1305    Subjective  I'm doing better.   My sister's have been visiting.  We've been walking 35 minutes a day, and they had me do this (stand from sitting without UE support).    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 It measured 1 cm and is located in the upper inner quadrant. 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 She underwent a right mastectomy and sentinel node biopsy on 08/06/17. She is wearing a boot  on her left foot due to a fall and foot fracture 08/04/17. Her orthopedist for her foot is Dr. Doran Durand.     Currently in Pain?  No/denies                      [facility] Adult PT Treatment/Exercise - 12/23/17 0001      Knee/Hip Exercises: Aerobic   Nustep  L4 x 8 mins.      Knee/Hip Exercises: Standing   Lateral Step Up  Right;Left;10 reps;Hand Hold: 1;Step Height: 6" try 8" next time    Forward Step Up  Right;Left;Hand Hold: 1;Step Height: 6" try 8" next time    Step Down  Right;Left;Hand Hold: 1;Step Height: 6"    Functional Squat  1 set sit to stand with arms across chest 30 sec. approx. 8    Walking with Sports Cord  sidestep with yellow Theraband around ankles, left and right about 20 feet with  contact guard assist    Other Standing Knee Exercises  side shuffle right and left approx. 20 feet with therapist holding gait belt on patient    Other Standing Knee Exercises  modified jumping jack (one leg at a time) with contact guard assist x 30 seconds fairly slow pace; felt medium hard to her                   Breast Clinic Goals - 06/26/17 2119      Patient will be able to verbalize understanding of pertinent lymphedema risk reduction practices relevant to her diagnosis specifically related to skin care.   Time  1    Period  Days    Status  Achieved      Patient will be able to return demonstrate and/or verbalize understanding of the post-op home exercise program related to regaining shoulder range of motion.   Time  1    Period  Days    Status  Achieved      Patient will be able to verbalize understanding of the importance of attending the postoperative After Breast Cancer Class for further lymphedema risk reduction education and therapeutic exercise.   Time  1    Period  Days    Status  Achieved       Long Term Clinic Goals - 12/04/17 1354      CC Long Term Goal  #1   Title  Patient will perform home exercise program correctly and independently.    Time  8    Period  Weeks    Status  On-going      CC Long Term Goal  #2   Title  Patient will demonstrate right shoulder active flexion at >/= 130 degrees for increased ease reaching overhead.    Baseline  111 degrees today, 11/01/17- 125 degrees, 12/04/17- 132 degrees    Time  8    Period  Weeks    Status  Achieved      CC Long Term Goal  #3   Title  Patient will demonstrate right shoulder active abduction at >/= 137 degrees for increased ease reaching overhead.    Baseline  125 degrees today, 11/01/17- 121 degrees, 12/04/17- 115 degrees    Time  8    Period  Weeks    Status  On-going      CC Long Term Goal  #4   Title  Patient will report she is able to participate in a regular walking program for 30  minutes, 5-6 days per  week for reducing recurrence risk and better tolerance of chemotherapy.    Baseline  Unable to walk long distance with boot on., 11/01/17- pt has been unable to walk due to shakiness, 12/04/17- pt now has a rollator and can walk around in her house with it, she has just started walking outside since her shakiness is more controlled    Time  8    Period  Weeks    Status  On-going      CC Long Term Goal  #5   Title  Patient will verbalize understanding of risk reduction practices related to lymphedema.    Baseline  11/01/17- pt required some cueing for this, 12/04/17- pt able to verbalize this    Time  8    Period  Weeks    Status  Achieved      CC Long Term Goal  #6   Title  Quick DASH score reduced to </= 4 for improved function of right arm.    Time  8    Period  Weeks    Status  On-going      CC Long Term Goal  #7   Title  Pt will be able to sit to stand 12x in 30 seconds to decrease risk of falls    Baseline  6    Time  4    Period  Weeks    Status  New    Target Date  01/01/18      CC Long Term Goal  #8   Title  Pt to be able to complete TUG in less than 13 seconds without use of UEs to decrease risk of falls    Baseline  25 seconds without use of UEs    Time  4    Period  Weeks    Status  New    Target Date  01/01/18      Additional Goals   Additional Goals  Yes         Plan - 12/23/17 1713    Clinical Impression Statement  Pt. is feeling much better these days with the chemo clearing her system and being halfway through radiation, and it shows in her face as well as in her physical performance.  She was challenged by exercise today and should continue to benefit from exercise for strengthening and endurance. Pt. asked about doing some of today's exercises at home, but was asked to hold off until her safety with them is established.    Rehab Potential  Excellent    Clinical Impairments Affecting Rehab Potential  pt no longer receiving chemo, pt  currently receiving radiation    PT Frequency  2x / week    PT Duration  8 weeks    PT Treatment/Interventions  Therapeutic exercise;Patient/family education;Balance training;Neuromuscular re-education;Gait training;ADLs/Self Care Home Management;Therapeutic activities;Functional mobility training;Manual techniques;Passive range of motion;Scar mobilization    PT Next Visit Plan  Check goals; continue Nustep OR bike, functional strengthening and balance; right shoulder ROM into abduction    PT Home Exercise Plan  Seated and standing LE exercises    Consulted and Agree with Plan of Care  Patient       Patient will benefit from skilled therapeutic intervention in order to improve the following deficits and impairments:  Postural dysfunction, Decreased knowledge of precautions, Pain, Impaired UE functional use, Decreased range of motion, Difficulty walking, Decreased activity tolerance, Decreased strength  Visit Diagnosis: Muscle weakness (generalized)  Difficulty in walking, not elsewhere classified  Stiffness of  right shoulder, not elsewhere classified     Problem List Patient Active Problem List   Diagnosis Date Noted  . Insomnia 10/07/2017  . Dehydration 10/07/2017  . Port-A-Cath in place 09/30/2017  . Genetic testing 06/27/2017  . Family history of breast cancer   . Malignant neoplasm of upper-inner quadrant of right breast in female, estrogen receptor positive ([place]) 06/26/2017    Aariona Momon 12/23/2017, 5:18 PM  [place], [place], [address] Phone: [phone]   Fax:  [phone]  Name: Gloria Ware MRN: [id number] Date of Birth: 05-20-1946  Serafina Royals, PT 12/23/17 5:19 PM

## 2017-12-24 ENCOUNTER — Ambulatory Visit
Admission: RE | Admit: 2017-12-24 | Discharge: 2017-12-24 | Disposition: A | Discharge status: Home / Self Care | Payer: Medicare Other | Source: Ambulatory Visit | Attending: Radiation Oncology | Admitting: Radiation Oncology

## 2017-12-24 DIAGNOSIS — Z170 Estrogen receptor positive status [ER+]: Secondary | ICD-10-CM | POA: Diagnosis not present

## 2017-12-24 DIAGNOSIS — C50211 Malignant neoplasm of upper-inner quadrant of right female breast: Secondary | ICD-10-CM | POA: Diagnosis not present

## 2017-12-24 DIAGNOSIS — Z510 Encounter for antineoplastic radiation therapy: Secondary | ICD-10-CM | POA: Diagnosis not present

## 2017-12-24 DIAGNOSIS — F411 Generalized anxiety disorder: Secondary | ICD-10-CM | POA: Diagnosis not present

## 2017-12-25 ENCOUNTER — Ambulatory Visit
Admission: RE | Admit: 2017-12-25 | Discharge: 2017-12-25 | Disposition: A | Discharge status: Home / Self Care | Payer: Medicare Other | Source: Ambulatory Visit | Attending: Radiation Oncology | Admitting: Radiation Oncology

## 2017-12-25 ENCOUNTER — Ambulatory Visit: Payer: Medicare Other

## 2017-12-25 DIAGNOSIS — M6281 Muscle weakness (generalized): Secondary | ICD-10-CM

## 2017-12-25 DIAGNOSIS — C50211 Malignant neoplasm of upper-inner quadrant of right female breast: Secondary | ICD-10-CM | POA: Diagnosis not present

## 2017-12-25 DIAGNOSIS — R293 Abnormal posture: Secondary | ICD-10-CM | POA: Diagnosis not present

## 2017-12-25 DIAGNOSIS — M25611 Stiffness of right shoulder, not elsewhere classified: Secondary | ICD-10-CM | POA: Diagnosis not present

## 2017-12-25 DIAGNOSIS — R262 Difficulty in walking, not elsewhere classified: Secondary | ICD-10-CM

## 2017-12-25 DIAGNOSIS — Z510 Encounter for antineoplastic radiation therapy: Secondary | ICD-10-CM | POA: Diagnosis not present

## 2017-12-25 DIAGNOSIS — Z170 Estrogen receptor positive status [ER+]: Secondary | ICD-10-CM | POA: Diagnosis not present

## 2017-12-25 NOTE — Therapy (Signed)
[place], [place], [address] Phone: [phone]   Fax:  [phone]  Physical Therapy Treatment  Patient Details  Name: Gloria Ware MRN: [id number] Date of Birth: [birth year] Referring Provider: Dr. [name]   Encounter Date: 12/25/2017  PT End of Session - 12/25/17 1422    Visit Number  22    Number of Visits  29    Date for PT Re-Evaluation  01/01/18    PT Start Time  1350 Pt arrived late    PT Stop Time  1430    PT Time Calculation (min)  40 min    Activity Tolerance  Patient tolerated treatment well    Behavior During Therapy  [facility] for tasks assessed/performed       Past Medical History:  Diagnosis Date  . Anxiety   . Breast CA ([place])   . Colon polyp 09/12/2005  . Depression   . Eczema    right forearm   . Family history of breast cancer   . Genetic testing 06/27/2017   Multi-Cancer panel (83 genes) @ [facility] - No pathogenic mutations detected  . Hearing loss   . High cholesterol   . Injury     left foot metatarsal fracture sustined 08-03-17 , treated with boot; patient  reports  improvement as of today  . OCD (obsessive compulsive disorder)   . Osteopenia 11/2016   T score -2.1 stable from prior DEXA  . PONV (postoperative nausea and vomiting)    post op nausea  . Tinnitus     Past Surgical History:  Procedure Laterality Date  . CARDIAC CATHETERIZATION     husband was having symtpoms and needed a stent; subsequently she states she started " to feel something"  but thinks it was " all in my head bc of my hbsand". subsequently saw same cardiologist as her husband who sent for treadmill stress that had inconcluisicve results so she was sent for cardiac catherization which she states was " definitely negative "   . cataract surg    . DILATION AND CURETTAGE OF UTERUS    . IR CV LINE INJECTION  09/30/2017  . MASTECTOMY W/ SENTINEL NODE BIOPSY Right 08/06/2017   Procedure: RIGHT  MASTECTOMY WITH SENTINEL LYMPH NODE BIOPSY;  Surgeon: [name], MD;  Location: [place];  Service: General;  Laterality: Right;  . MOUTH SURGERY    . PORTACATH PLACEMENT N/A 09/12/2017   Procedure: INSERTION PORT-A-CATH;  Surgeon: [name], MD;  [facility];  Service: General;  Laterality: N/A;  . RADIOACTIVE SEED GUIDED AXILLARY SENTINEL LYMPH NODE Right 08/06/2017   Procedure: SEED TARGETED AXILLARY LYMPH NODE EXCISION;  Surgeon: [name], MD;  Location: [place];  Service: General;  Laterality: Right;    There were no vitals filed for this visit.  Subjective Assessment - 12/25/17 1359    Subjective  Went to [employer] and walked all day with my sisters and then drove to [facility] for BBQ and I just felt really good all day, and was just a bit tired end of day. I also haven't had to take the Valium to sleep in the past few days so that's big progress. 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 It measured 1 cm and is located in the upper inner quadrant. 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 She underwent a right mastectomy and sentinel node biopsy on 08/06/17. She is wearing a boot on her left foot due to a fall and foot fracture 08/04/17. Her orthopedist for her foot is Dr. Doran Durand.     Patient Stated Goals  Make sure my arm is ok and that I'm strong enough for chemotherapy.    Currently in Pain?  No/denies                      [facility] Adult PT Treatment/Exercise - 12/25/17 0001      Knee/Hip Exercises: Aerobic   Nustep  L5 x8 mins      Knee/Hip Exercises: Standing   Lateral Step Up  Right;Left;10 reps;Hand Hold: 2;Step Height: 8" at counter    Forward  Step Up  Right;Left;10 reps;Hand Hold: 2;Step Height: 8" at counter    Functional Squat  1 set sit-stand, no UE support, 10x in 30 seconds=goal met    Other Standing Knee Exercises  bil sidestepping at counter 2x each way    Other Standing Knee Exercises  modified jumping jack (one leg at a time) with SBA 2 x 30 seconds                   Breast Clinic Goals - 06/26/17 2119      Patient will be able to verbalize understanding of pertinent lymphedema risk reduction practices relevant to her diagnosis specifically related to skin care.   Time  1    Period  Days    Status  Achieved      Patient will be able to return demonstrate and/or verbalize understanding of the post-op home exercise program related to regaining shoulder range of motion.   Time  1    Period  Days    Status  Achieved      Patient will be able to verbalize understanding of the importance of attending the postoperative After Breast Cancer Class for further lymphedema risk reduction education and therapeutic exercise.   Time  1    Period  Days    Status  Achieved       Long Term Clinic Goals - 12/25/17 1403      CC Long Term Goal  #1   Title  Patient will perform home exercise program correctly and independently.    Status  Partially Met      CC Long Term Goal  #2   Title  Patient will demonstrate right shoulder active flexion at >/= 130 degrees for increased ease reaching overhead.    Baseline  111 degrees today, 11/01/17- 125 degrees, 12/04/17- 132 degrees    Status  Achieved      CC Long Term Goal  #3   Title  Patient will demonstrate right shoulder active abduction at >/= 137 degrees for increased ease reaching overhead.    Baseline  125 degrees today, 11/01/17- 121 degrees, 12/04/17- 115 degrees; 121 degrees-12/25/17    Status  On-going      CC Long Term Goal  #4   Title  Patient will report she is able to participate in a regular walking program for 30 minutes, 5-6 days per week for reducing  recurrence risk and better tolerance of chemotherapy.    Baseline  Unable to walk long distance with boot on., 11/01/17- pt  has been unable to walk due to shakiness, 12/04/17- pt now has a rollator and can walk around in her house with it, she has just started walking outside since her shakiness is more controlled; Have walked for 35 mins since Thursday except Mon when I had PT-12/25/17    Status  Partially Met      CC Long Term Goal  #5   Title  Patient will verbalize understanding of risk reduction practices related to lymphedema.    Baseline  11/01/17- pt required some cueing for this, 12/04/17- pt able to verbalize this    Status  Achieved      CC Long Term Goal  #6   Title  Quick DASH score reduced to </= 4 for improved function of right arm.    Status  On-going      CC Long Term Goal  #7   Title  Pt will be able to sit to stand 12x in 30 seconds to decrease risk of falls    Baseline  6; 13x in 30 sec- 12/25/17    Status  Achieved      CC Long Term Goal  #8   Title  Pt to be able to complete TUG in less than 13 seconds without use of UEs to decrease risk of falls    Baseline  25 seconds without use of UEs; 10 sec- 12/25/17    Status  Achieved         Plan - 12/25/17 1427    Clinical Impression Statement  Pt continues making excellent progres at each session tolerating exercises a little longer and with less fatigue reported after. 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     Rehab Potential  Excellent    Clinical Impairments Affecting Rehab Potential  pt no longer receiving chemo, pt currently receiving radiation    PT Frequency  2x / week    PT Duration  8 weeks    PT Treatment/Interventions  Therapeutic exercise;Patient/family education;Balance training;Neuromuscular re-education;Gait training;ADLs/Self Care Home Management;Therapeutic activities;Functional  mobility training;Manual techniques;Passive range of motion;Scar mobilization    PT Next Visit Plan  Continue Nustep OR bike, functional strengthening and balance; right shoulder ROM into abduction    Consulted and Agree with Plan of Care  Patient       Patient will benefit from skilled therapeutic intervention in order to improve the following deficits and impairments:  Postural dysfunction, Decreased knowledge of precautions, Pain, Impaired UE functional use, Decreased range of motion, Difficulty walking, Decreased activity tolerance, Decreased strength  Visit Diagnosis: Muscle weakness (generalized)  Difficulty in walking, not elsewhere classified  Stiffness of right shoulder, not elsewhere classified  Abnormal posture     Problem List Patient Active Problem List   Diagnosis Date Noted  . Insomnia 10/07/2017  . Dehydration 10/07/2017  . Port-A-Cath in place 09/30/2017  . Genetic testing 06/27/2017  . Family history of breast cancer   . Malignant neoplasm of upper-inner quadrant of right breast in female, estrogen receptor positive ([place]) 06/26/2017    Otelia Limes, PTA 12/25/2017, 2:32 PM  [place], [place], [address] Phone: [phone]   Fax:  [phone]  Name: RHIANNAN KIEVIT MRN: [id number] Date of Birth: 11-17-1945

## 2017-12-26 ENCOUNTER — Ambulatory Visit
Admission: RE | Admit: 2017-12-26 | Discharge: 2017-12-26 | Disposition: A | Discharge status: Home / Self Care | Payer: Medicare Other | Source: Ambulatory Visit | Attending: Radiation Oncology | Admitting: Radiation Oncology

## 2017-12-26 DIAGNOSIS — Z510 Encounter for antineoplastic radiation therapy: Secondary | ICD-10-CM | POA: Diagnosis not present

## 2017-12-26 DIAGNOSIS — C50211 Malignant neoplasm of upper-inner quadrant of right female breast: Secondary | ICD-10-CM | POA: Diagnosis not present

## 2017-12-26 DIAGNOSIS — Z170 Estrogen receptor positive status [ER+]: Secondary | ICD-10-CM | POA: Diagnosis not present

## 2017-12-27 ENCOUNTER — Ambulatory Visit
Admission: RE | Admit: 2017-12-27 | Discharge: 2017-12-27 | Disposition: A | Discharge status: Home / Self Care | Payer: Medicare Other | Source: Ambulatory Visit | Attending: Radiation Oncology | Admitting: Radiation Oncology

## 2017-12-27 DIAGNOSIS — Z170 Estrogen receptor positive status [ER+]: Secondary | ICD-10-CM | POA: Diagnosis not present

## 2017-12-27 DIAGNOSIS — C50211 Malignant neoplasm of upper-inner quadrant of right female breast: Secondary | ICD-10-CM | POA: Diagnosis not present

## 2017-12-27 DIAGNOSIS — Z510 Encounter for antineoplastic radiation therapy: Secondary | ICD-10-CM | POA: Diagnosis not present

## 2017-12-30 ENCOUNTER — Other Ambulatory Visit: Payer: Medicare Other

## 2017-12-30 ENCOUNTER — Ambulatory Visit: Payer: Medicare Other | Admitting: Radiation Oncology

## 2017-12-30 ENCOUNTER — Ambulatory Visit
Admission: RE | Admit: 2017-12-30 | Discharge: 2017-12-30 | Disposition: A | Discharge status: Home / Self Care | Payer: Medicare Other | Source: Ambulatory Visit | Attending: Radiation Oncology | Admitting: Radiation Oncology

## 2017-12-30 ENCOUNTER — Ambulatory Visit: Payer: Medicare Other

## 2017-12-30 ENCOUNTER — Ambulatory Visit: Payer: Medicare Other | Admitting: Physical Therapy

## 2017-12-30 ENCOUNTER — Encounter: Payer: Self-pay | Admitting: Physical Therapy

## 2017-12-30 DIAGNOSIS — M25611 Stiffness of right shoulder, not elsewhere classified: Secondary | ICD-10-CM | POA: Diagnosis not present

## 2017-12-30 DIAGNOSIS — M6281 Muscle weakness (generalized): Secondary | ICD-10-CM | POA: Diagnosis not present

## 2017-12-30 DIAGNOSIS — R293 Abnormal posture: Secondary | ICD-10-CM

## 2017-12-30 DIAGNOSIS — R262 Difficulty in walking, not elsewhere classified: Secondary | ICD-10-CM | POA: Diagnosis not present

## 2017-12-30 DIAGNOSIS — C50211 Malignant neoplasm of upper-inner quadrant of right female breast: Secondary | ICD-10-CM | POA: Diagnosis not present

## 2017-12-30 DIAGNOSIS — Z510 Encounter for antineoplastic radiation therapy: Secondary | ICD-10-CM | POA: Diagnosis not present

## 2017-12-30 DIAGNOSIS — Z170 Estrogen receptor positive status [ER+]: Secondary | ICD-10-CM | POA: Diagnosis not present

## 2017-12-30 NOTE — Therapy (Signed)
[place], [place], [address] Phone: [phone]   Fax:  [phone]  Physical Therapy Treatment  Patient Details  Name: Gloria Ware MRN: [id number] Date of Birth: 12/10/1945 Referring Provider: Dr. [name]   Encounter Date: 12/30/2017  PT End of Session - 12/30/17 1623    Visit Number  23    Number of Visits  29    Date for PT Re-Evaluation  01/01/18    PT Start Time  1300    PT Stop Time  1350    PT Time Calculation (min)  50 min    Activity Tolerance  Patient tolerated treatment well    Behavior During Therapy  [facility] for tasks assessed/performed       Past Medical History:  Diagnosis Date  . Anxiety   . Breast CA ([place])   . Colon polyp 09/12/2005  . Depression   . Eczema    right forearm   . Family history of breast cancer   . Genetic testing 06/27/2017   Multi-Cancer panel (83 genes) @ [facility] - No pathogenic mutations detected  . Hearing loss   . High cholesterol   . Injury     left foot metatarsal fracture sustined 08-03-17 , treated with boot; patient  reports  improvement as of today  . OCD (obsessive compulsive disorder)   . Osteopenia 11/2016   T score -2.1 stable from prior DEXA  . PONV (postoperative nausea and vomiting)    post op nausea  . Tinnitus     Past Surgical History:  Procedure Laterality Date  . CARDIAC CATHETERIZATION     husband was having symtpoms and needed a stent; subsequently she states she started " to feel something"  but thinks it was " all in my head bc of my hbsand". subsequently saw same cardiologist as her husband who sent for treadmill stress that had inconcluisicve results so she was sent for cardiac catherization which she states was " definitely negative "   . cataract surg    . DILATION AND CURETTAGE OF UTERUS    . IR CV LINE INJECTION  09/30/2017  . MASTECTOMY W/ SENTINEL NODE BIOPSY Right 08/06/2017   Procedure: RIGHT MASTECTOMY WITH  SENTINEL LYMPH NODE BIOPSY;  Surgeon: [name], MD;  Location: [place];  Service: General;  Laterality: Right;  . MOUTH SURGERY    . PORTACATH PLACEMENT N/A 09/12/2017   Procedure: INSERTION PORT-A-CATH;  Surgeon: [name], MD;  [facility];  Service: General;  Laterality: N/A;  . RADIOACTIVE SEED GUIDED AXILLARY SENTINEL LYMPH NODE Right 08/06/2017   Procedure: SEED TARGETED AXILLARY LYMPH NODE EXCISION;  Surgeon: [name], MD;  Location: [place];  Service: General;  Laterality: Right;    There were no vitals filed for this visit.  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 They were short but they were back to back.     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 It measured 1 cm and is located in the upper inner quadrant. 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 She underwent a right mastectomy and sentinel node biopsy on 08/06/17. She is wearing a boot on her left  foot due to a fall and foot fracture 08/04/17. Her orthopedist for her foot is Dr. Doran Durand.     Patient Stated Goals  Make sure my arm is ok and that I'm strong enough for chemotherapy.    Currently in Pain?  No/denies    Pain Score  0-No pain                No data recorded       [facility] Adult PT Treatment/Exercise - 12/30/17 0001      Knee/Hip Exercises: Aerobic   Stationary Bike  Level 1 x 8 min      Knee/Hip Exercises: Standing   Lateral Step Up  Right;Left;10 reps;Step Height: 8" at counter    Forward Step Up  Right;Left;10 reps;Step Height: 8";Hand Hold: 1 at counter    Functional Squat  20 reps    Other Standing Knee Exercises  bil sidestepping down hall x 2 with no support      Manual Therapy    Passive ROM  to right shoulder in direction of abduction and flexion since pt is having increased trouble with radiation due to tightness in armpit                   Breast Clinic Goals - 06/26/17 2119      Patient will be able to verbalize understanding of pertinent lymphedema risk reduction practices relevant to her diagnosis specifically related to skin care.   Time  1    Period  Days    Status  Achieved      Patient will be able to return demonstrate and/or verbalize understanding of the post-op home exercise program related to regaining shoulder range of motion.   Time  1    Period  Days    Status  Achieved      Patient will be able to verbalize understanding of the importance of attending the postoperative After Breast Cancer Class for further lymphedema risk reduction education and therapeutic exercise.   Time  1    Period  Days    Status  Achieved       Long Term Clinic Goals - 12/25/17 1403      CC Long Term Goal  #1   Title  Patient will perform home exercise program correctly and independently.    Status  Partially Met      CC Long Term Goal  #2   Title  Patient will demonstrate right shoulder active flexion at >/= 130 degrees for increased ease reaching overhead.    Baseline  111 degrees today, 11/01/17- 125 degrees, 12/04/17- 132 degrees    Status  Achieved      CC Long Term Goal  #3   Title  Patient will demonstrate right shoulder active abduction at >/= 137 degrees for increased ease reaching overhead.    Baseline  125 degrees today, 11/01/17- 121 degrees, 12/04/17- 115 degrees; 121 degrees-12/25/17    Status  On-going      CC Long Term Goal  #4   Title  Patient will report she is able to participate in a regular walking program for 30 minutes, 5-6 days per week for reducing recurrence risk and better tolerance of chemotherapy.    Baseline  Unable to walk long distance with boot on., 11/01/17- pt has been unable to walk due to shakiness, 12/04/17- pt  now has a rollator and can walk around in her house with it, she has just started walking outside since her shakiness  is more controlled; Have walked for 35 mins since Thursday except Mon when I had PT-12/25/17    Status  Partially Met      CC Long Term Goal  #5   Title  Patient will verbalize understanding of risk reduction practices related to lymphedema.    Baseline  11/01/17- pt required some cueing for this, 12/04/17- pt able to verbalize this    Status  Achieved      CC Long Term Goal  #6   Title  Quick DASH score reduced to </= 4 for improved function of right arm.    Status  On-going      CC Long Term Goal  #7   Title  Pt will be able to sit to stand 12x in 30 seconds to decrease risk of falls    Baseline  6; 13x in 30 sec- 12/25/17    Status  Achieved      CC Long Term Goal  #8   Title  Pt to be able to complete TUG in less than 13 seconds without use of UEs to decrease risk of falls    Baseline  25 seconds without use of UEs; 10 sec- 12/25/17    Status  Achieved         Plan - 12/30/17 1624    Clinical Impression Statement  Pt continues to progress in therapy. She was a little unstable during some of her transitions today but did not have any loss of balance. She is ambulating with no assistive device. Educated pt about IT trainer. Pt to call and sign up for next class. 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 Pt will need a renewal at next session. Will decrease to 1x/wk to make sure pt is independent with an HEP and will transition to the [facility].     Rehab Potential  Excellent    Clinical Impairments Affecting Rehab Potential  pt no longer receiving chemo, pt currently receiving radiation    PT Frequency  2x / week    PT Duration  8 weeks    PT Treatment/Interventions  Therapeutic exercise;Patient/family education;Balance training;Neuromuscular re-education;Gait training;ADLs/Self Care Home  Management;Therapeutic activities;Functional mobility training;Manual techniques;Passive range of motion;Scar mobilization    PT Next Visit Plan  recert 1x/wk for 4 wks, Continue Nustep OR bike, functional strengthening and balance; right shoulder ROM into abduction    PT Home Exercise Plan  Seated and standing LE exercises    Consulted and Agree with Plan of Care  Patient       Patient will benefit from skilled therapeutic intervention in order to improve the following deficits and impairments:  Postural dysfunction, Decreased knowledge of precautions, Pain, Impaired UE functional use, Decreased range of motion, Difficulty walking, Decreased activity tolerance, Decreased strength  Visit Diagnosis: Muscle weakness (generalized)  Difficulty in walking, not elsewhere classified  Stiffness of right shoulder, not elsewhere classified  Abnormal posture     Problem List Patient Active Problem List   Diagnosis Date Noted  . Insomnia 10/07/2017  . Dehydration 10/07/2017  . Port-A-Cath in place 09/30/2017  . Genetic testing 06/27/2017  . Family history of breast cancer   . Malignant neoplasm of upper-inner quadrant of right breast in female, estrogen receptor positive ([place]) 06/26/2017    Allyson Sabal [facility] 12/30/2017, 4:26 PM  [place], [place], [address] Phone: [phone]   Fax:  [phone]  Name: Gloria Ware MRN: [id number] Date of Birth: [birth year]  Allyson Sabal [place], PT 12/30/17 4:27 PM

## 2017-12-31 ENCOUNTER — Ambulatory Visit
Admission: RE | Admit: 2017-12-31 | Discharge: 2017-12-31 | Disposition: A | Discharge status: Home / Self Care | Payer: Medicare Other | Source: Ambulatory Visit | Attending: Radiation Oncology | Admitting: Radiation Oncology

## 2017-12-31 DIAGNOSIS — C50211 Malignant neoplasm of upper-inner quadrant of right female breast: Secondary | ICD-10-CM | POA: Diagnosis not present

## 2017-12-31 DIAGNOSIS — Z510 Encounter for antineoplastic radiation therapy: Secondary | ICD-10-CM | POA: Diagnosis not present

## 2017-12-31 DIAGNOSIS — Z170 Estrogen receptor positive status [ER+]: Secondary | ICD-10-CM | POA: Diagnosis not present

## 2018-01-01 ENCOUNTER — Ambulatory Visit
Admission: RE | Admit: 2018-01-01 | Discharge: 2018-01-01 | Disposition: A | Discharge status: Home / Self Care | Payer: Medicare Other | Source: Ambulatory Visit | Attending: Radiation Oncology | Admitting: Radiation Oncology

## 2018-01-01 ENCOUNTER — Ambulatory Visit: Payer: Medicare Other | Admitting: Physical Therapy

## 2018-01-01 ENCOUNTER — Encounter: Payer: Self-pay | Admitting: Physical Therapy

## 2018-01-01 DIAGNOSIS — R262 Difficulty in walking, not elsewhere classified: Secondary | ICD-10-CM | POA: Diagnosis not present

## 2018-01-01 DIAGNOSIS — M6281 Muscle weakness (generalized): Secondary | ICD-10-CM | POA: Diagnosis not present

## 2018-01-01 DIAGNOSIS — R293 Abnormal posture: Secondary | ICD-10-CM

## 2018-01-01 DIAGNOSIS — Z510 Encounter for antineoplastic radiation therapy: Secondary | ICD-10-CM | POA: Diagnosis not present

## 2018-01-01 DIAGNOSIS — M25611 Stiffness of right shoulder, not elsewhere classified: Secondary | ICD-10-CM

## 2018-01-01 DIAGNOSIS — Z170 Estrogen receptor positive status [ER+]: Secondary | ICD-10-CM | POA: Diagnosis not present

## 2018-01-01 DIAGNOSIS — C50211 Malignant neoplasm of upper-inner quadrant of right female breast: Secondary | ICD-10-CM | POA: Diagnosis not present

## 2018-01-01 NOTE — Patient Instructions (Signed)
Access Code: [email or web address]  URL: [email or web address]  Date: 01/01/2018  Prepared by: Manus Gunning   Exercises  Standing Hip Flexion with Counter Support - 10 reps - 1x daily - 7x weekly  Standing Hip Extension with Counter Support - 10 reps - 1 sets - 1x daily - 7x weekly  Standing Hip Abduction with Counter Support - 10 reps - 1 sets - 1x daily - 7x weekly

## 2018-01-01 NOTE — Therapy (Signed)
[place], [place], [address] Phone: [phone]   Fax:  [phone]  Physical Therapy Treatment  Patient Details  Name: Gloria Ware MRN: [id number] Date of Birth: Sep 12, 1946 Referring Provider: Dr. [name]   Encounter Date: 01/01/2018  PT End of Session - 01/01/18 1548    Visit Number  24 add kx    Number of Visits  33    Date for PT Re-Evaluation  01/29/18    PT Start Time  1354 pt arrived late    PT Stop Time  1432    PT Time Calculation (min)  38 min    Activity Tolerance  Patient tolerated treatment well    Behavior During Therapy  [facility] for tasks assessed/performed       Past Medical History:  Diagnosis Date  . Anxiety   . Breast CA ([place])   . Colon polyp 09/12/2005  . Depression   . Eczema    right forearm   . Family history of breast cancer   . Genetic testing 06/27/2017   Multi-Cancer panel (83 genes) @ [facility] - No pathogenic mutations detected  . Hearing loss   . High cholesterol   . Injury     left foot metatarsal fracture sustined 08-03-17 , treated with boot; patient  reports  improvement as of today  . OCD (obsessive compulsive disorder)   . Osteopenia 11/2016   T score -2.1 stable from prior DEXA  . PONV (postoperative nausea and vomiting)    post op nausea  . Tinnitus     Past Surgical History:  Procedure Laterality Date  . CARDIAC CATHETERIZATION     husband was having symtpoms and needed a stent; subsequently she states she started " to feel something"  but thinks it was " all in my head bc of my hbsand". subsequently saw same cardiologist as her husband who sent for treadmill stress that had inconcluisicve results so she was sent for cardiac catherization which she states was " definitely negative "   . cataract surg    . DILATION AND CURETTAGE OF UTERUS    . IR CV LINE INJECTION  09/30/2017  . MASTECTOMY W/ SENTINEL NODE BIOPSY Right 08/06/2017   Procedure:  RIGHT MASTECTOMY WITH SENTINEL LYMPH NODE BIOPSY;  Surgeon: [name], MD;  Location: [place];  Service: General;  Laterality: Right;  . MOUTH SURGERY    . PORTACATH PLACEMENT N/A 09/12/2017   Procedure: INSERTION PORT-A-CATH;  Surgeon: [name], MD;  Location: [facility];  Service: General;  Laterality: N/A;  . RADIOACTIVE SEED GUIDED AXILLARY SENTINEL LYMPH NODE Right 08/06/2017   Procedure: SEED TARGETED AXILLARY LYMPH NODE EXCISION;  Surgeon: [name], MD;  Location: [place];  Service: General;  Laterality: Right;    There were no vitals filed for this visit.  Subjective Assessment - 01/01/18 1357    Subjective  I have not called [employer] yet. 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 It measured 1 cm and is located in the upper inner quadrant. 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 She underwent a right mastectomy and sentinel node biopsy on 08/06/17. She is wearing a boot on her left foot due to a  fall and foot fracture 08/04/17. Her orthopedist for her foot is Dr. Doran Durand.     Patient Stated Goals  Make sure my arm is ok and that I'm strong enough for chemotherapy.    Currently in Pain?  No/denies    Pain Score  0-No pain               Katina Dung - 01/01/18 0001    Open a tight or new jar  Moderate difficulty    Do heavy household chores (wash walls, wash floors)  Moderate difficulty    Carry a shopping bag or briefcase  Mild difficulty    Wash your back  Mild difficulty    Use a knife to cut food  No difficulty    Recreational activities in which you take some force or impact through your arm, shoulder, or hand (golf, hammering, tennis)  Mild difficulty    During the past week, to what extent has your arm,  shoulder or hand problem interfered with your normal social activities with family, friends, neighbors, or groups?  Not at all    During the past week, to what extent has your arm, shoulder or hand problem limited your work or other regular daily activities  Slightly    Arm, shoulder, or hand pain.  Mild    Tingling (pins and needles) in your arm, shoulder, or hand  None    Difficulty Sleeping  No difficulty    DASH Score  20.45 %      No data recorded       [facility] Adult PT Treatment/Exercise - 01/01/18 0001      Knee/Hip Exercises: Aerobic   Stationary Bike  Level 1 x 5 min      Manual Therapy   Manual Therapy  Passive ROM;Muscle Energy Technique    Passive ROM  to right shoulder in direction of abduction and flexion since pt is having increased trouble with radiation due to tightness in armpit    Muscle Energy Technique  contract relax to right shoulder to increase right shoulder abduction ROM with increased ROM obtained following this                   Breast Clinic Goals - 06/26/17 2119      Patient will be able to verbalize understanding of pertinent lymphedema risk reduction practices relevant to her diagnosis specifically related to skin care.   Time  1    Period  Days    Status  Achieved      Patient will be able to return demonstrate and/or verbalize understanding of the post-op home exercise program related to regaining shoulder range of motion.   Time  1    Period  Days    Status  Achieved      Patient will be able to verbalize understanding of the importance of attending the postoperative After Breast Cancer Class for further lymphedema risk reduction education and therapeutic exercise.   Time  1    Period  Days    Status  Achieved       Long Term Clinic Goals - 01/01/18 1358      CC Long Term Goal  #1   Title  Patient will perform home exercise program correctly and independently.    Time  8    Period  Weeks    Status  Partially Met      CC  Long Term Goal  #2   Title  Patient will demonstrate right  shoulder active flexion at >/= 130 degrees for increased ease reaching overhead.    Baseline  111 degrees today, 11/01/17- 125 degrees, 12/04/17- 132 degrees    Time  8    Period  Weeks    Status  Achieved      CC Long Term Goal  #3   Title  Patient will demonstrate right shoulder active abduction at >/= 137 degrees for increased ease reaching overhead.    Baseline  125 degrees today, 11/01/17- 121 degrees, 12/04/17- 115 degrees; 121 degrees-12/25/17, 01/01/18- 121    Time  8    Period  Weeks    Status  On-going      CC Long Term Goal  #4   Title  Patient will report she is able to participate in a regular walking program for 30 minutes, 5-6 days per week for reducing recurrence risk and better tolerance of chemotherapy.    Baseline  Unable to walk long distance with boot on., 11/01/17- pt has been unable to walk due to shakiness, 12/04/17- pt now has a rollator and can walk around in her house with it, she has just started walking outside since her shakiness is more controlled; Have walked for 35 mins since Thursday except Mon when I had PT-12/25/17, 01/01/18- pt is able to complete this now but has not in the past few days because she has been playing 2 concerts a day    Time  8    Period  Weeks    Status  Achieved      CC Long Term Goal  #5   Title  Patient will verbalize understanding of risk reduction practices related to lymphedema.    Baseline  11/01/17- pt required some cueing for this, 12/04/17- pt able to verbalize this    Time  8    Period  Weeks    Status  Achieved      CC Long Term Goal  #6   Title  Quick DASH score reduced to </= 4 for improved function of right arm.    Baseline  01/01/18- 20.45    Time  8    Period  Weeks    Status  On-going      CC Long Term Goal  #7   Title  Pt will be able to sit to stand 12x in 30 seconds to decrease risk of falls    Baseline  6; 13x in 30 sec- 12/25/17    Time  4    Period  Weeks     Status  Achieved      CC Long Term Goal  #8   Title  Pt to be able to complete TUG in less than 13 seconds without use of UEs to decrease risk of falls    Baseline  25 seconds without use of UEs; 10 sec- 12/25/17    Time  4    Period  Weeks    Status  Achieved         Plan - 01/01/18 1549    Clinical Impression Statement  Assessed pt's progress towards goals in therapy. Pt has met many of her goals. She is still having limited right shoulder ROM especially into abduction. Pt has 2 weeks of radiation treatments left. Focused today on increasing right shoulder abduction ROM and used contract relax to help increase ROM. Pt is very tight and is still limited on the [place] questionaire. She would benefit from additional skilled PT visits to solidify a home exercise program  for pt to continue after discharge, increase right shoulder ROM and decrease tightness. Pt will decrease to 1x/wk for another 4 wks.     Rehab Potential  Excellent    Clinical Impairments Affecting Rehab Potential  pt no longer receiving chemo, pt currently receiving radiation    PT Frequency  1x / week    PT Duration  4 weeks    PT Treatment/Interventions  Therapeutic exercise;Patient/family education;Balance training;Neuromuscular re-education;Gait training;ADLs/Self Care Home Management;Therapeutic activities;Functional mobility training;Manual techniques;Passive range of motion;Scar mobilization    PT Next Visit Plan  solidify HEP, Continue Nustep OR bike, functional strengthening and balance; right focus on shoulder ROM into abduction- use contract relax    PT Home Exercise Plan  Seated and standing LE exercises    Consulted and Agree with Plan of Care  Patient       Patient will benefit from skilled therapeutic intervention in order to improve the following deficits and impairments:  Postural dysfunction, Decreased knowledge of precautions, Pain, Impaired UE functional use, Decreased range of motion, Difficulty walking,  Decreased activity tolerance, Decreased strength  Visit Diagnosis: Muscle weakness (generalized)  Difficulty in walking, not elsewhere classified  Stiffness of right shoulder, not elsewhere classified  Abnormal posture     Problem List Patient Active Problem List   Diagnosis Date Noted  . Insomnia 10/07/2017  . Dehydration 10/07/2017  . Port-A-Cath in place 09/30/2017  . Genetic testing 06/27/2017  . Family history of breast cancer   . Malignant neoplasm of upper-inner quadrant of right breast in female, estrogen receptor positive ([place]) 06/26/2017    [facility] 01/01/2018, 3:53 PM  [place], [place], [address] Phone: [phone]   Fax:  [phone]  Name: Gloria Ware MRN: [id number] Date of Birth: Aug 19, 1946  Manus Gunning, PT 01/01/18 3:54 PM

## 2018-01-02 ENCOUNTER — Ambulatory Visit
Admission: RE | Admit: 2018-01-02 | Discharge: 2018-01-02 | Disposition: A | Discharge status: Home / Self Care | Payer: Medicare Other | Source: Ambulatory Visit | Attending: Radiation Oncology | Admitting: Radiation Oncology

## 2018-01-02 DIAGNOSIS — C50211 Malignant neoplasm of upper-inner quadrant of right female breast: Secondary | ICD-10-CM | POA: Diagnosis not present

## 2018-01-02 DIAGNOSIS — Z170 Estrogen receptor positive status [ER+]: Secondary | ICD-10-CM | POA: Diagnosis not present

## 2018-01-02 DIAGNOSIS — Z510 Encounter for antineoplastic radiation therapy: Secondary | ICD-10-CM | POA: Diagnosis not present

## 2018-01-02 DIAGNOSIS — F411 Generalized anxiety disorder: Secondary | ICD-10-CM | POA: Diagnosis not present

## 2018-01-03 ENCOUNTER — Ambulatory Visit: Payer: Medicare Other | Admitting: Radiation Oncology

## 2018-01-03 ENCOUNTER — Ambulatory Visit
Admission: RE | Admit: 2018-01-03 | Discharge: 2018-01-03 | Disposition: A | Discharge status: Home / Self Care | Payer: Medicare Other | Source: Ambulatory Visit | Attending: Radiation Oncology | Admitting: Radiation Oncology

## 2018-01-03 DIAGNOSIS — C50211 Malignant neoplasm of upper-inner quadrant of right female breast: Secondary | ICD-10-CM | POA: Diagnosis not present

## 2018-01-03 DIAGNOSIS — Z510 Encounter for antineoplastic radiation therapy: Secondary | ICD-10-CM | POA: Diagnosis not present

## 2018-01-03 DIAGNOSIS — Z170 Estrogen receptor positive status [ER+]: Secondary | ICD-10-CM | POA: Diagnosis not present

## 2018-01-06 ENCOUNTER — Ambulatory Visit
Admission: RE | Admit: 2018-01-06 | Discharge: 2018-01-06 | Disposition: A | Discharge status: Home / Self Care | Payer: Medicare Other | Source: Ambulatory Visit | Attending: Radiation Oncology | Admitting: Radiation Oncology

## 2018-01-06 ENCOUNTER — Encounter: Payer: Self-pay | Admitting: Rehabilitation

## 2018-01-06 ENCOUNTER — Other Ambulatory Visit: Payer: Medicare Other

## 2018-01-06 ENCOUNTER — Ambulatory Visit: Payer: Medicare Other

## 2018-01-06 ENCOUNTER — Ambulatory Visit: Payer: Medicare Other | Admitting: Hematology and Oncology

## 2018-01-06 ENCOUNTER — Ambulatory Visit: Payer: Medicare Other | Attending: General Surgery | Admitting: Rehabilitation

## 2018-01-06 DIAGNOSIS — R262 Difficulty in walking, not elsewhere classified: Secondary | ICD-10-CM | POA: Insufficient documentation

## 2018-01-06 DIAGNOSIS — M25611 Stiffness of right shoulder, not elsewhere classified: Secondary | ICD-10-CM | POA: Insufficient documentation

## 2018-01-06 DIAGNOSIS — Z170 Estrogen receptor positive status [ER+]: Secondary | ICD-10-CM | POA: Diagnosis not present

## 2018-01-06 DIAGNOSIS — C50211 Malignant neoplasm of upper-inner quadrant of right female breast: Secondary | ICD-10-CM | POA: Diagnosis not present

## 2018-01-06 DIAGNOSIS — M6281 Muscle weakness (generalized): Secondary | ICD-10-CM

## 2018-01-06 DIAGNOSIS — R293 Abnormal posture: Secondary | ICD-10-CM | POA: Diagnosis not present

## 2018-01-06 DIAGNOSIS — Z510 Encounter for antineoplastic radiation therapy: Secondary | ICD-10-CM | POA: Diagnosis not present

## 2018-01-06 NOTE — Therapy (Signed)
[facility] [address] [place], [place], [address] Phone: [phone]   Fax:  [phone]  Physical Therapy Treatment  Patient Details  Name: Gloria Ware MRN: [id number] Date of Birth: 08/14/1946 Referring Provider: Dr. Faera Byerly   Encounter Date: 01/06/2018  PT End of Session - 01/06/18 1314    Visit Number  25    Number of Visits  33    Date for PT Re-Evaluation  01/29/18    PT Start Time  1310    PT Stop Time  1345    PT Time Calculation (min)  35 min    Activity Tolerance  Patient tolerated treatment well    Behavior During Therapy  [facility] for tasks assessed/performed       Past Medical History:  Diagnosis Date  . Anxiety   . Breast CA ([facility])   . Colon polyp 09/12/2005  . Depression   . Eczema    right forearm   . Family history of breast cancer   . Genetic testing 06/27/2017   Multi-Cancer panel (83 genes) @ [facility] - No pathogenic mutations detected  . Hearing loss   . High cholesterol   . Injury     left foot metatarsal fracture sustined 08-03-17 , treated with boot; patient  reports  improvement as of today  . OCD (obsessive compulsive disorder)   . Osteopenia 11/2016   T score -2.1 stable from prior DEXA  . PONV (postoperative nausea and vomiting)    post op nausea  . Tinnitus     Past Surgical History:  Procedure Laterality Date  . CARDIAC CATHETERIZATION     husband was having symtpoms and needed a stent; subsequently she states she started " to feel something"  but thinks it was " all in my head bc of my hbsand". subsequently saw same cardiologist as her husband who sent for treadmill stress that had inconcluisicve results so she was sent for cardiac catherization which she states was " definitely negative "   . cataract surg    . DILATION AND CURETTAGE OF UTERUS    . IR CV LINE INJECTION  09/30/2017  . MASTECTOMY W/ SENTINEL NODE BIOPSY Right 08/06/2017   Procedure: RIGHT MASTECTOMY WITH  SENTINEL LYMPH NODE BIOPSY;  Surgeon: Byerly, Faera, MD;  Location: [facility];  Service: General;  Laterality: Right;  . MOUTH SURGERY    . PORTACATH PLACEMENT N/A 09/12/2017   Procedure: INSERTION PORT-A-CATH;  Surgeon: Byerly, Faera, MD;  [facility];  Service: General;  Laterality: N/A;  . RADIOACTIVE SEED GUIDED AXILLARY SENTINEL LYMPH NODE Right 08/06/2017   Procedure: SEED TARGETED AXILLARY LYMPH NODE EXCISION;  Surgeon: Byerly, Faera, MD;  Location: [facility];  Service: General;  Laterality: Right;    There were no vitals filed for this visit.  Subjective Assessment - 01/06/18 1312    Subjective  just sleepy today.  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 It measured 1 cm and is located in the upper inner quadrant. 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 She underwent a right mastectomy and sentinel node biopsy on 08/06/17. She is wearing a boot on her left foot due to a fall and foot   fracture 08/04/17. Her orthopedist for her foot is Dr. Doran Durand.     Patient Stated Goals  Make sure my arm is ok and that I'm strong enough for chemotherapy.         [facility] PT Assessment - 01/06/18 0001      Observation/Other Assessments   Skin Integrity  small white patches in R axilla today with delicate/slightly wet appearance but pt reporting she just put lotion on them.  Gentle ROM performed due to this status                   [facility] Adult PT Treatment/Exercise - 01/06/18 0001      Knee/Hip Exercises: Aerobic   Stationary Bike  Level 1 x 39mn       Shoulder Exercises: Supine   Flexion  AROM;Right;5 reps    Other Supine Exercises  Lt sidelying Abduction AROM Rt x 7      Manual Therapy   Manual Therapy  Passive  ROM;Muscle Energy Technique;Other (comment)    Passive ROM  to right shoulder in direction of abduction and flexion since pt is having increased trouble with radiation due to tightness in armpit    Other Manual Therapy  manual isometrics into ER, IR, and flexion 6" x 10    Muscle Energy Technique  --                   Breast Clinic Goals - 06/26/17 2119      Patient will be able to verbalize understanding of pertinent lymphedema risk reduction practices relevant to her diagnosis specifically related to skin care.   Time  1    Period  Days    Status  Achieved      Patient will be able to return demonstrate and/or verbalize understanding of the post-op home exercise program related to regaining shoulder range of motion.   Time  1    Period  Days    Status  Achieved      Patient will be able to verbalize understanding of the importance of attending the postoperative After Breast Cancer Class for further lymphedema risk reduction education and therapeutic exercise.   Time  1    Period  Days    Status  Achieved       Long Term Clinic Goals - 01/01/18 1358      CC Long Term Goal  #1   Title  Patient will perform home exercise program correctly and independently.    Time  8    Period  Weeks    Status  Partially Met      CC Long Term Goal  #2   Title  Patient will demonstrate right shoulder active flexion at >/= 130 degrees for increased ease reaching overhead.    Baseline  111 degrees today, 11/01/17- 125 degrees, 12/04/17- 132 degrees    Time  8    Period  Weeks    Status  Achieved      CC Long Term Goal  #3   Title  Patient will demonstrate right shoulder active abduction at >/= 137 degrees for increased ease reaching overhead.    Baseline  125 degrees today, 11/01/17- 121 degrees, 12/04/17- 115 degrees; 121 degrees-12/25/17, 01/01/18- 121    Time  8    Period  Weeks    Status  On-going      CC Long Term Goal  #4   Title  Patient will report she is able to  participate in  a regular walking program for 30 minutes, 5-6 days per week for reducing recurrence risk and better tolerance of chemotherapy.    Baseline  Unable to walk long distance with boot on., 11/01/17- pt has been unable to walk due to shakiness, 12/04/17- pt now has a rollator and can walk around in her house with it, she has just started walking outside since her shakiness is more controlled; Have walked for 35 mins since Thursday except Mon when I had PT-12/25/17, 01/01/18- pt is able to complete this now but has not in the past few days because she has been playing 2 concerts a day    Time  8    Period  Weeks    Status  Achieved      CC Long Term Goal  #5   Title  Patient will verbalize understanding of risk reduction practices related to lymphedema.    Baseline  11/01/17- pt required some cueing for this, 12/04/17- pt able to verbalize this    Time  8    Period  Weeks    Status  Achieved      CC Long Term Goal  #6   Title  Quick DASH score reduced to </= 4 for improved function of right arm.    Baseline  01/01/18- 20.45    Time  8    Period  Weeks    Status  On-going      CC Long Term Goal  #7   Title  Pt will be able to sit to stand 12x in 30 seconds to decrease risk of falls    Baseline  6; 13x in 30 sec- 12/25/17    Time  4    Period  Weeks    Status  Achieved      CC Long Term Goal  #8   Title  Pt to be able to complete TUG in less than 13 seconds without use of UEs to decrease risk of falls    Baseline  25 seconds without use of UEs; 10 sec- 12/25/17    Time  4    Period  Weeks    Status  Achieved         Plan - 01/06/18 1345    Clinical Impression Statement  Pt tolerated all well today with focus on endurance and Rt shoulder ROM.  She reports and presents improved today with shoulder ROM into abduction.  In her last week of radiation  Very gentle ROM work today due to white possibly slightly wet appearance to skin in Rt axilla.  Pt reports it is probably just from  lotion she puts on and she denied any pulling to that area with PROM throughout    Rehab Potential  Excellent    Clinical Impairments Affecting Rehab Potential  pt no longer receiving chemo, pt currently receiving radiation    PT Frequency  1x / week    PT Duration  4 weeks    PT Treatment/Interventions  Therapeutic exercise;Patient/family education;Balance training;Neuromuscular re-education;Gait training;ADLs/Self Care Home Management;Therapeutic activities;Functional mobility training;Manual techniques;Passive range of motion;Scar mobilization    PT Next Visit Plan  check Rt axillary skin status.  solidify HEP, Continue Nustep OR bike, functional strengthening and balance; right focus on shoulder ROM into abduction- use contract relax    PT Home Exercise Plan  Seated and standing LE exercises       Patient will benefit from skilled therapeutic intervention in order to improve the following deficits and impairments:  Postural dysfunction,  Decreased knowledge of precautions, Pain, Impaired UE functional use, Decreased range of motion, Difficulty walking, Decreased activity tolerance, Decreased strength  Visit Diagnosis: Muscle weakness (generalized)  Stiffness of right shoulder, not elsewhere classified  Carcinoma of upper-inner quadrant of right breast in female, estrogen receptor positive ([place])  Abnormal posture     Problem List Patient Active Problem List   Diagnosis Date Noted  . Insomnia 10/07/2017  . Dehydration 10/07/2017  . Port-A-Cath in place 09/30/2017  . Genetic testing 06/27/2017  . Family history of breast cancer   . Malignant neoplasm of upper-inner quadrant of right breast in female, estrogen receptor positive ([place]) 06/26/2017   Shan Levans, PT 01/06/2018, 5:03 PM  [place], [place], [address] Phone: [phone]   Fax:  [phone]  Name: Gloria Ware MRN: [id number] Date of  Birth: November 20, 1945

## 2018-01-06 NOTE — Therapy (Signed)
[facility] [address] [place], [place], [address] Phone: [phone]   Fax:  [phone]  Physical Therapy Treatment  Patient Details  Name: Gloria Ware MRN: [id number] Date of Birth: 12/02/1945 Referring Provider: Dr. Faera Byerly   Encounter Date: 01/06/2018  PT End of Session - 01/06/18 1314    Visit Number  25    Number of Visits  33    Date for PT Re-Evaluation  01/29/18    PT Start Time  1310    PT Stop Time  1345    PT Time Calculation (min)  35 min    Activity Tolerance  Patient tolerated treatment well    Behavior During Therapy  WFL for tasks assessed/performed       Past Medical History:  Diagnosis Date  . Anxiety   . Breast CA (HCC)   . Colon polyp 09/12/2005  . Depression   . Eczema    right forearm   . Family history of breast cancer   . Genetic testing 06/27/2017   Multi-Cancer panel (83 genes) @ [facility] - No pathogenic mutations detected  . Hearing loss   . High cholesterol   . Injury     left foot metatarsal fracture sustined 08-03-17 , treated with boot; patient  reports  improvement as of today  . OCD (obsessive compulsive disorder)   . Osteopenia 11/2016   T score -2.1 stable from prior DEXA  . PONV (postoperative nausea and vomiting)    post op nausea  . Tinnitus     Past Surgical History:  Procedure Laterality Date  . CARDIAC CATHETERIZATION     husband was having symtpoms and needed a stent; subsequently she states she started " to feel something"  but thinks it was " all in my head bc of my hbsand". subsequently saw same cardiologist as her husband who sent for treadmill stress that had inconcluisicve results so she was sent for cardiac catherization which she states was " definitely negative "   . cataract surg    . DILATION AND CURETTAGE OF UTERUS    . IR CV LINE INJECTION  09/30/2017  . MASTECTOMY W/ SENTINEL NODE BIOPSY Right 08/06/2017   Procedure: RIGHT MASTECTOMY WITH  SENTINEL LYMPH NODE BIOPSY;  Surgeon: Byerly, Faera, MD;  Location: [facility];  Service: General;  Laterality: Right;  . MOUTH SURGERY    . PORTACATH PLACEMENT N/A 09/12/2017   Procedure: INSERTION PORT-A-CATH;  Surgeon: Byerly, Faera, MD;  Location: [facility];  Service: General;  Laterality: N/A;  . RADIOACTIVE SEED GUIDED AXILLARY SENTINEL LYMPH NODE Right 08/06/2017   Procedure: SEED TARGETED AXILLARY LYMPH NODE EXCISION;  Surgeon: Byerly, Faera, MD;  Location: [facility];  Service: General;  Laterality: Right;    There were no vitals filed for this visit.  Subjective Assessment - 01/06/18 1312    Subjective  just sleepy today.  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 It measured 1 cm and is located in the upper inner quadrant. 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 She underwent a right mastectomy and sentinel node biopsy on 08/06/17. She is wearing a boot on her left foot due to a fall and foot   fracture 08/04/17. Her orthopedist for her foot is Dr. Doran Durand.     Patient Stated Goals  Make sure my arm is ok and that I'm strong enough for chemotherapy.                       [facility] Adult PT Treatment/Exercise - 01/06/18 0001      Knee/Hip Exercises: Aerobic   Stationary Bike  Level 1 x 86mn       Shoulder Exercises: Supine   Flexion  AROM;Right;5 reps    Other Supine Exercises  Lt sidelying Abduction AROM Rt x 7      Manual Therapy   Manual Therapy  Passive ROM;Muscle Energy Technique;Other (comment)    Passive ROM  to right shoulder in direction of abduction and flexion since pt is having increased trouble with radiation due to tightness in armpit    Other Manual Therapy  manual isometrics into ER, IR, and  flexion 6" x 10    Muscle Energy Technique  contract relax to right shoulder to increase right shoulder abduction ROM with increased ROM obtained following this                   Breast Clinic Goals - 06/26/17 2119      Patient will be able to verbalize understanding of pertinent lymphedema risk reduction practices relevant to her diagnosis specifically related to skin care.   Time  1    Period  Days    Status  Achieved      Patient will be able to return demonstrate and/or verbalize understanding of the post-op home exercise program related to regaining shoulder range of motion.   Time  1    Period  Days    Status  Achieved      Patient will be able to verbalize understanding of the importance of attending the postoperative After Breast Cancer Class for further lymphedema risk reduction education and therapeutic exercise.   Time  1    Period  Days    Status  Achieved       Long Term Clinic Goals - 01/01/18 1358      CC Long Term Goal  #1   Title  Patient will perform home exercise program correctly and independently.    Time  8    Period  Weeks    Status  Partially Met      CC Long Term Goal  #2   Title  Patient will demonstrate right shoulder active flexion at >/= 130 degrees for increased ease reaching overhead.    Baseline  111 degrees today, 11/01/17- 125 degrees, 12/04/17- 132 degrees    Time  8    Period  Weeks    Status  Achieved      CC Long Term Goal  #3   Title  Patient will demonstrate right shoulder active abduction at >/= 137 degrees for increased ease reaching overhead.    Baseline  125 degrees today, 11/01/17- 121 degrees, 12/04/17- 115 degrees; 121 degrees-12/25/17, 01/01/18- 121    Time  8    Period  Weeks    Status  On-going      CC Long Term Goal  #4   Title  Patient will report she is able to participate in a regular walking program for 30 minutes, 5-6 days per week for reducing recurrence risk and better tolerance of chemotherapy.     Baseline  Unable to walk long distance with boot on., 11/01/17-  pt has been unable to walk due to shakiness, 12/04/17- pt now has a rollator and can walk around in her house with it, she has just started walking outside since her shakiness is more controlled; Have walked for 35 mins since Thursday except Mon when I had PT-12/25/17, 01/01/18- pt is able to complete this now but has not in the past few days because she has been playing 2 concerts a day    Time  8    Period  Weeks    Status  Achieved      CC Long Term Goal  #5   Title  Patient will verbalize understanding of risk reduction practices related to lymphedema.    Baseline  11/01/17- pt required some cueing for this, 12/04/17- pt able to verbalize this    Time  8    Period  Weeks    Status  Achieved      CC Long Term Goal  #6   Title  Quick DASH score reduced to </= 4 for improved function of right arm.    Baseline  01/01/18- 20.45    Time  8    Period  Weeks    Status  On-going      CC Long Term Goal  #7   Title  Pt will be able to sit to stand 12x in 30 seconds to decrease risk of falls    Baseline  6; 13x in 30 sec- 12/25/17    Time  4    Period  Weeks    Status  Achieved      CC Long Term Goal  #8   Title  Pt to be able to complete TUG in less than 13 seconds without use of UEs to decrease risk of falls    Baseline  25 seconds without use of UEs; 10 sec- 12/25/17    Time  4    Period  Weeks    Status  Achieved         Plan - 01/06/18 1345    Clinical Impression Statement  Pt tolerated all well today with focus on endurance and Rt shoulder ROM.  She reports and presents improved today with shoulder ROM into abduction.  In her last week of radiation    Rehab Potential  Excellent    Clinical Impairments Affecting Rehab Potential  pt no longer receiving chemo, pt currently receiving radiation    PT Frequency  1x / week    PT Duration  4 weeks    PT Treatment/Interventions  Therapeutic exercise;Patient/family  education;Balance training;Neuromuscular re-education;Gait training;ADLs/Self Care Home Management;Therapeutic activities;Functional mobility training;Manual techniques;Passive range of motion;Scar mobilization    PT Next Visit Plan  solidify HEP, Continue [employer] OR bike, functional strengthening and balance; right focus on shoulder ROM into abduction- use contract relax    PT Home Exercise Plan  Seated and standing LE exercises       Patient will benefit from skilled therapeutic intervention in order to improve the following deficits and impairments:  Postural dysfunction, Decreased knowledge of precautions, Pain, Impaired UE functional use, Decreased range of motion, Difficulty walking, Decreased activity tolerance, Decreased strength  Visit Diagnosis: Muscle weakness (generalized)  Stiffness of right shoulder, not elsewhere classified  Carcinoma of upper-inner quadrant of right breast in female, estrogen receptor positive (HCC)  Abnormal posture     Problem List Patient Active Problem List   Diagnosis Date Noted  . Insomnia 10/07/2017  . Dehydration 10/07/2017  . Port-A-Cath in place 09/30/2017  .   Genetic testing 06/27/2017  . Family history of breast cancer   . Malignant neoplasm of upper-inner quadrant of right breast in female, estrogen receptor positive ([place]) 06/26/2017    Shan Levans, PT  01/06/2018, 1:49 PM  [place], [place], [address] Phone: [phone]   Fax:  [phone]  Name: Gloria Ware MRN: [id number] Date of Birth: [birth year]

## 2018-01-07 ENCOUNTER — Ambulatory Visit
Admission: RE | Admit: 2018-01-07 | Discharge: 2018-01-07 | Disposition: A | Discharge status: Home / Self Care | Payer: Medicare Other | Source: Ambulatory Visit | Attending: Radiation Oncology | Admitting: Radiation Oncology

## 2018-01-07 DIAGNOSIS — Z510 Encounter for antineoplastic radiation therapy: Secondary | ICD-10-CM | POA: Diagnosis not present

## 2018-01-07 DIAGNOSIS — F4321 Adjustment disorder with depressed mood: Secondary | ICD-10-CM | POA: Diagnosis not present

## 2018-01-07 DIAGNOSIS — Z170 Estrogen receptor positive status [ER+]: Secondary | ICD-10-CM | POA: Diagnosis not present

## 2018-01-07 DIAGNOSIS — C50211 Malignant neoplasm of upper-inner quadrant of right female breast: Secondary | ICD-10-CM | POA: Diagnosis not present

## 2018-01-08 ENCOUNTER — Ambulatory Visit
Admission: RE | Admit: 2018-01-08 | Discharge: 2018-01-08 | Disposition: A | Discharge status: Home / Self Care | Payer: Medicare Other | Source: Ambulatory Visit | Attending: Radiation Oncology | Admitting: Radiation Oncology

## 2018-01-08 DIAGNOSIS — Z170 Estrogen receptor positive status [ER+]: Secondary | ICD-10-CM | POA: Diagnosis not present

## 2018-01-08 DIAGNOSIS — C50211 Malignant neoplasm of upper-inner quadrant of right female breast: Secondary | ICD-10-CM | POA: Diagnosis not present

## 2018-01-08 DIAGNOSIS — Z510 Encounter for antineoplastic radiation therapy: Secondary | ICD-10-CM | POA: Diagnosis not present

## 2018-01-09 ENCOUNTER — Ambulatory Visit
Admission: RE | Admit: 2018-01-09 | Discharge: 2018-01-09 | Disposition: A | Discharge status: Home / Self Care | Payer: Medicare Other | Source: Ambulatory Visit | Attending: Radiation Oncology | Admitting: Radiation Oncology

## 2018-01-09 DIAGNOSIS — C50211 Malignant neoplasm of upper-inner quadrant of right female breast: Secondary | ICD-10-CM | POA: Diagnosis not present

## 2018-01-09 DIAGNOSIS — Z510 Encounter for antineoplastic radiation therapy: Secondary | ICD-10-CM | POA: Diagnosis not present

## 2018-01-09 DIAGNOSIS — Z170 Estrogen receptor positive status [ER+]: Secondary | ICD-10-CM | POA: Diagnosis not present

## 2018-01-10 ENCOUNTER — Ambulatory Visit
Admission: RE | Admit: 2018-01-10 | Discharge: 2018-01-10 | Disposition: A | Discharge status: Home / Self Care | Payer: Medicare Other | Source: Ambulatory Visit | Attending: Radiation Oncology | Admitting: Radiation Oncology

## 2018-01-10 DIAGNOSIS — Z170 Estrogen receptor positive status [ER+]: Secondary | ICD-10-CM | POA: Diagnosis not present

## 2018-01-10 DIAGNOSIS — C50211 Malignant neoplasm of upper-inner quadrant of right female breast: Secondary | ICD-10-CM | POA: Diagnosis not present

## 2018-01-10 DIAGNOSIS — Z510 Encounter for antineoplastic radiation therapy: Secondary | ICD-10-CM | POA: Diagnosis not present

## 2018-01-13 ENCOUNTER — Other Ambulatory Visit: Payer: Medicare Other

## 2018-01-13 ENCOUNTER — Ambulatory Visit
Admission: RE | Admit: 2018-01-13 | Discharge: 2018-01-13 | Disposition: A | Discharge status: Home / Self Care | Payer: Medicare Other | Source: Ambulatory Visit | Attending: Radiation Oncology | Admitting: Radiation Oncology

## 2018-01-13 ENCOUNTER — Ambulatory Visit: Payer: Medicare Other

## 2018-01-13 ENCOUNTER — Encounter: Payer: Self-pay | Admitting: Rehabilitation

## 2018-01-13 ENCOUNTER — Ambulatory Visit: Payer: Medicare Other | Admitting: Rehabilitation

## 2018-01-13 DIAGNOSIS — R262 Difficulty in walking, not elsewhere classified: Secondary | ICD-10-CM | POA: Diagnosis not present

## 2018-01-13 DIAGNOSIS — R293 Abnormal posture: Secondary | ICD-10-CM

## 2018-01-13 DIAGNOSIS — M6281 Muscle weakness (generalized): Secondary | ICD-10-CM

## 2018-01-13 DIAGNOSIS — M25611 Stiffness of right shoulder, not elsewhere classified: Secondary | ICD-10-CM

## 2018-01-13 DIAGNOSIS — Z510 Encounter for antineoplastic radiation therapy: Secondary | ICD-10-CM | POA: Diagnosis not present

## 2018-01-13 DIAGNOSIS — C50211 Malignant neoplasm of upper-inner quadrant of right female breast: Secondary | ICD-10-CM | POA: Diagnosis not present

## 2018-01-13 DIAGNOSIS — Z170 Estrogen receptor positive status [ER+]: Secondary | ICD-10-CM | POA: Diagnosis not present

## 2018-01-13 NOTE — Therapy (Signed)
[place], [place], [address] Phone: [phone]   Fax:  [phone]  Physical Therapy Treatment  Patient Details  Name: Gloria Ware MRN: [id number] Date of Birth: 1946-05-07 Referring Provider: Dr. [name]   Encounter Date: 01/13/2018  PT End of Session - 01/13/18 1357    Visit Number  26    PT Start Time  1310    PT Stop Time  3382    PT Time Calculation (min)  39 min    Activity Tolerance  Patient tolerated treatment well    Behavior During Therapy  [facility] for tasks assessed/performed       Past Medical History:  Diagnosis Date  . Anxiety   . Breast CA ([place])   . Colon polyp 09/12/2005  . Depression   . Eczema    right forearm   . Family history of breast cancer   . Genetic testing 06/27/2017   Multi-Cancer panel (83 genes) @ [facility] - No pathogenic mutations detected  . Hearing loss   . High cholesterol   . Injury     left foot metatarsal fracture sustined 08-03-17 , treated with boot; patient  reports  improvement as of today  . OCD (obsessive compulsive disorder)   . Osteopenia 11/2016   T score -2.1 stable from prior DEXA  . PONV (postoperative nausea and vomiting)    post op nausea  . Tinnitus     Past Surgical History:  Procedure Laterality Date  . CARDIAC CATHETERIZATION     husband was having symtpoms and needed a stent; subsequently she states she started " to feel something"  but thinks it was " all in my head bc of my hbsand". subsequently saw same cardiologist as her husband who sent for treadmill stress that had inconcluisicve results so she was sent for cardiac catherization which she states was " definitely negative "   . cataract surg    . DILATION AND CURETTAGE OF UTERUS    . IR CV LINE INJECTION  09/30/2017  . MASTECTOMY W/ SENTINEL NODE BIOPSY Right 08/06/2017   Procedure: RIGHT MASTECTOMY WITH SENTINEL LYMPH NODE BIOPSY;  Surgeon: [name], MD;   Location: [facility];  Service: General;  Laterality: Right;  . MOUTH SURGERY    . PORTACATH PLACEMENT N/A 09/12/2017   Procedure: INSERTION PORT-A-CATH;  Surgeon: [name], MD;  Location: [facility];  Service: General;  Laterality: N/A;  . RADIOACTIVE SEED GUIDED AXILLARY SENTINEL LYMPH NODE Right 08/06/2017   Procedure: SEED TARGETED AXILLARY LYMPH NODE EXCISION;  Surgeon: [name], MD;  Location: [facility];  Service: General;  Laterality: Right;    There were no vitals filed for this visit.  Subjective Assessment - 01/13/18 1311    Subjective  Today last day of Radiation is today.  Reports she feels back to normal except for the LE stiffness that is now back to baseline.  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  [employer] is full for now but will be going do the [facility] for bike and walking.  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 It measured 1 cm and is located in the upper inner quadrant. She  also has a fairly large lipoma present on her right superior shoulder. She underwent a right mastectomy and sentinel node biopsy on 08/06/17. She is wearing a boot on her left foot due to a fall and foot fracture 08/04/17. Her orthopedist for her foot is Dr. Doran Durand.     Currently in Pain?  No/denies                       [facility] Adult PT Treatment/Exercise - 01/13/18 0001      Self-Care   Other Self-Care Comments   printed out [place] exercises and compiled approprate ones with education for patient      Knee/Hip Exercises: Aerobic   Stationary Bike  Level1 x 76mn             PT Education - 01/13/18 1354    Education provided  Yes    Education Details  OPrincipal Financialgiven, Pt  to work on increasing endurance and strength independently with [place] shoulder exercises, and gym bike for now, education on effects of radiation after completing radiation and how to slowly increase bike time and rJournalist, newspaper Educated  Patient    Methods  Explanation;Verbal cues;Handout    Comprehension  Verbalized understanding           Breast Clinic Goals - 06/26/17 2119      Patient will be able to verbalize understanding of pertinent lymphedema risk reduction practices relevant to her diagnosis specifically related to skin care.   Time  1    Period  Days    Status  Achieved      Patient will be able to return demonstrate and/or verbalize understanding of the post-op home exercise program related to regaining shoulder range of motion.   Time  1    Period  Days    Status  Achieved      Patient will be able to verbalize understanding of the importance of attending the postoperative After Breast Cancer Class for further lymphedema risk reduction education and therapeutic exercise.   Time  1    Period  Days    Status  Achieved       Long Term Clinic Goals - 01/13/18 1326      CC Long Term Goal  #1   Title  Patient will perform home exercise program correctly and independently.    Status  On-going      CC Long Term Goal  #2   Title  Patient will demonstrate right shoulder active flexion at >/= 130 degrees for increased ease reaching overhead.    Status  Achieved      CC Long Term Goal  #3   Title  Patient will demonstrate right shoulder active abduction at >/= 137 degrees for increased ease reaching overhead.    Status  Achieved      CC Long Term Goal  #4   Title  Patient will report she is able to participate in a regular walking program for 30 minutes, 5-6 days per week for reducing recurrence risk and better tolerance of chemotherapy.    Status  Achieved      CC Long Term Goal  #5   Title  Patient will verbalize understanding of risk reduction practices  related to lymphedema.    Status  Achieved      CC Long Term Goal  #6   Title  Quick DASH score reduced to </= 4 for improved function of right arm.  Status  On-going      CC Long Term Goal  #7   Title  Pt will be able to sit to stand 12x in 30 seconds to decrease risk of falls    Status  Achieved      CC Long Term Goal  #8   Title  Pt to be able to complete TUG in less than 13 seconds without use of UEs to decrease risk of falls    Status  Achieved      Additional Goals   Additional Goals  Yes      Additional Goal #1   Title  Pt will be able to pedal the recumbent bike x 10 minutes without it turning off after 4 week independent trial for HEP to demonstrate improved endurance    Baseline  around 4 minutes    Time  4    Period  Weeks    Status  New    Target Date  02/10/18         Plan - 01/13/18 1358    Clinical Impression Statement  Pt reports no ADL shoulder limitations and has met ROM goals regarding the shoulder.  Her right radiated chest, axilla, and breast very red and sloughing blister tissue present throughout.  Pt reports MD just looked at it and gave her some medicated cream.  Pt continues with reports of LE weakness and fatigue and some difficulty keeping the stationary bike on x 6 minutes due to endurance.  She will attempt HEP consisting of [employer] bike, walking, OTAGO exercises and shoulder ROM x 4 weeks at this point to see if she can increase her status independently.      Clinical Impairments Affecting Rehab Potential  chemo and radiation completed    PT Treatment/Interventions  Therapeutic exercise;Patient/family education;Balance training;Neuromuscular re-education;Gait training;ADLs/Self Care Home Management;Therapeutic activities;Functional mobility training;Manual techniques;Passive range of motion;Scar mobilization    PT Next Visit Plan  4 week check for R shoulder ROM status, skin check, endurance and LE strength. progression of exercises or return to PT as  needed, needs QDASH for goals       Patient will benefit from skilled therapeutic intervention in order to improve the following deficits and impairments:  Postural dysfunction, Decreased knowledge of precautions, Pain, Impaired UE functional use, Decreased range of motion, Difficulty walking, Decreased activity tolerance, Decreased strength  Visit Diagnosis: Muscle weakness (generalized)  Stiffness of right shoulder, not elsewhere classified  Carcinoma of upper-inner quadrant of right breast in female, estrogen receptor positive ([place])  Difficulty in walking, not elsewhere classified  Abnormal posture     Problem List Patient Active Problem List   Diagnosis Date Noted  . Insomnia 10/07/2017  . Dehydration 10/07/2017  . Port-A-Cath in place 09/30/2017  . Genetic testing 06/27/2017  . Family history of breast cancer   . Malignant neoplasm of upper-inner quadrant of right breast in female, estrogen receptor positive ([place]) 06/26/2017    Shan Levans, PT 01/13/2018, 2:06 PM  [place], [place], [address] Phone: [phone]   Fax:  [phone]  Name: Gloria Ware MRN: [id number] Date of Birth: 1946/08/07

## 2018-01-13 NOTE — Assessment & Plan Note (Signed)
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 Mammaprint high risk: Dose since Adriamycin and Cytoxan x2 followed by Taxol weekly x1 09/23/2017-10/21/17 stopped for toxicities 2. adjuvant radiation therapy 11/28/17- 01/13/18 3. Followed by adjuvant antiestrogen therapy -------------------------------------------------------------------------------------------------------------------------------------- Plan: Anti estrogen therapy with Letrozole 1 mg daily starting 02/05/18  Letrozole Counseling: We discussed the risks and benefits of anti-estrogen therapy with aromatase inhibitors. These include but not limited to insomnia, hot flashes, mood changes, vaginal dryness, bone density loss, and weight gain. We strongly believe that the benefits far outweigh the risks. Patient understands these risks and consented to starting treatment. Planned treatment duration is 5 years.   RTC in 3 months for SCP visit

## 2018-01-14 ENCOUNTER — Inpatient Hospital Stay: Payer: Medicare Other | Attending: Hematology and Oncology

## 2018-01-14 ENCOUNTER — Ambulatory Visit: Payer: Medicare Other | Admitting: Hematology and Oncology

## 2018-01-14 ENCOUNTER — Inpatient Hospital Stay (HOSPITAL_BASED_OUTPATIENT_CLINIC_OR_DEPARTMENT_OTHER): Payer: Medicare Other | Admitting: Hematology and Oncology

## 2018-01-14 ENCOUNTER — Other Ambulatory Visit: Payer: Medicare Other

## 2018-01-14 ENCOUNTER — Telehealth: Payer: Self-pay | Admitting: Hematology and Oncology

## 2018-01-14 DIAGNOSIS — Z7982 Long term (current) use of aspirin: Secondary | ICD-10-CM | POA: Insufficient documentation

## 2018-01-14 DIAGNOSIS — E860 Dehydration: Secondary | ICD-10-CM

## 2018-01-14 DIAGNOSIS — Z170 Estrogen receptor positive status [ER+]: Secondary | ICD-10-CM | POA: Insufficient documentation

## 2018-01-14 DIAGNOSIS — Z923 Personal history of irradiation: Secondary | ICD-10-CM | POA: Diagnosis not present

## 2018-01-14 DIAGNOSIS — C773 Secondary and unspecified malignant neoplasm of axilla and upper limb lymph nodes: Secondary | ICD-10-CM | POA: Diagnosis not present

## 2018-01-14 DIAGNOSIS — Z79899 Other long term (current) drug therapy: Secondary | ICD-10-CM

## 2018-01-14 DIAGNOSIS — C50211 Malignant neoplasm of upper-inner quadrant of right female breast: Secondary | ICD-10-CM

## 2018-01-14 DIAGNOSIS — F5101 Primary insomnia: Secondary | ICD-10-CM

## 2018-01-14 DIAGNOSIS — C50911 Malignant neoplasm of unspecified site of right female breast: Secondary | ICD-10-CM

## 2018-01-14 DIAGNOSIS — Z9011 Acquired absence of right breast and nipple: Secondary | ICD-10-CM | POA: Insufficient documentation

## 2018-01-14 DIAGNOSIS — Z9221 Personal history of antineoplastic chemotherapy: Secondary | ICD-10-CM | POA: Insufficient documentation

## 2018-01-14 LAB — CBC WITH DIFFERENTIAL (CANCER CENTER ONLY)
BASOS ABS: 0 10*3/uL (ref 0.0–0.1)
Basophils Relative: 1 %
EOS PCT: 9 %
Eosinophils Absolute: 0.4 10*3/uL (ref 0.0–0.5)
HCT: 36.3 % (ref 34.8–46.6)
Hemoglobin: 12.2 g/dL (ref 11.6–15.9)
Lymphocytes Relative: 16 %
Lymphs Abs: 0.7 10*3/uL — ABNORMAL LOW (ref 0.9–3.3)
MCH: 30.9 pg (ref 25.1–34.0)
MCHC: 33.6 g/dL (ref 31.5–36.0)
MCV: 91.9 fL (ref 79.5–101.0)
MONO ABS: 0.4 10*3/uL (ref 0.1–0.9)
MONOS PCT: 10 %
Neutro Abs: 2.7 10*3/uL (ref 1.5–6.5)
Neutrophils Relative %: 64 %
PLATELETS: 161 10*3/uL (ref 145–400)
RBC: 3.95 MIL/uL (ref 3.70–5.45)
RDW: 13 % (ref 11.2–14.5)
WBC Count: 4.2 10*3/uL (ref 3.9–10.3)

## 2018-01-14 LAB — CMP (CANCER CENTER ONLY)
ALK PHOS: 181 U/L — AB (ref 40–150)
ALT: 65 U/L — ABNORMAL HIGH (ref 0–55)
AST: 30 U/L (ref 5–34)
Albumin: 3.8 g/dL (ref 3.5–5.0)
Anion gap: 8 (ref 3–11)
BILIRUBIN TOTAL: 0.2 mg/dL (ref 0.2–1.2)
BUN: 13 mg/dL (ref 7–26)
CALCIUM: 9.9 mg/dL (ref 8.4–10.4)
CO2: 27 mmol/L (ref 22–29)
Chloride: 103 mmol/L (ref 98–109)
Creatinine: 0.86 mg/dL (ref 0.60–1.10)
GFR, Est AFR Am: >60 mL/min (ref >60)
GFR, Estimated: >60 mL/min (ref >60)
Glucose, Bld: 106 mg/dL (ref 70–140)
Potassium: 4.2 mmol/L (ref 3.5–5.1)
Sodium: 138 mmol/L (ref 136–145)
TOTAL PROTEIN: 7.3 g/dL (ref 6.4–8.3)

## 2018-01-14 MED ORDER — ANASTROZOLE 1 MG PO TABS: 1.0000 mg | ORAL_TABLET | Freq: Every day | ORAL | 3 refills | Status: DC

## 2018-01-14 NOTE — Telephone Encounter (Signed)
Gave avs and calendar ° °

## 2018-01-14 NOTE — Progress Notes (Signed)
Patient Care Team: [name], MD as PCP - General (Family Medicine) [name], MD as Consulting Physician (General Surgery) Nicholas Lose, MD as Consulting Physician (Hematology and Oncology) Kyung Rudd, MD as Consulting Physician (Radiation Oncology)  DIAGNOSIS:  Encounter Diagnosis  Name Primary?  . Malignant neoplasm of upper-inner quadrant of right breast in female, estrogen receptor positive ([place])     SUMMARY OF ONCOLOGIC HISTORY:   Malignant neoplasm of upper-inner quadrant of right breast in female, estrogen receptor positive ([place])   06/21/2017 Initial Diagnosis    Screening detected right breast mass and calcifications 1 cm at 1:00 position biopsy of mass and calcifications revealed invasive lobular cancer with pleomorphic features, grade 2, axillary lymph node biopsy positive for cancer, ER/PR positive HER-2 negative with a Ki-67 of 40%, T1b N1 stage IB AJCC 8      08/06/2017 Surgery    Right mastectomy: Scattered foci of invasive lobular cancer, grade 2, 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, LCIS, margins negative, 2/6 lymph nodes +1 with isolated tumor cells, T1BN1 stage I the AJCC 8 ER 95%, PR 30%, HER-2 negative, Ki-67 40%      08/12/2017 Miscellaneous    Mammaprint high risk luminal type B      09/17/2017 -  Chemotherapy    Adjuvant chemo with dose dense Adriamycin and Cytoxan X 2 (Stopped due to toxicities) followed by Taxol weekly x1 stopped due to toxicities       10/07/2017 - 10/09/2017 Hospital Admission    Adm for failure to thrive, insomnia and back pain      11/28/2017 - 01/13/2018 Radiation Therapy    Adj XRT       CHIEF COMPLIANT: Follow-up after radiation therapy  INTERVAL HISTORY: Gloria Ware is a 33-year-old with above-mentioned history of right breast cancer treated with mastectomy and adjuvant chemotherapy.  She could not complete adjuvant chemo and she is currently finishing adjuvant radiation therapy.  She had severe side effects to chemotherapy  which included failure to thrive, insomnia and back pain issues.  All of these symptoms have remarkably improved.  She no longer has the back pain.  Her tremors have decreased significantly.  She is able to walk without assistance.  She is also been able to drive.  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  REVIEW OF SYSTEMS:   Constitutional: Denies fevers, chills or abnormal weight loss Eyes: Denies blurriness of vision Ears, nose, mouth, throat, and face: Denies mucositis or sore throat Respiratory: Denies cough, dyspnea or wheezes Cardiovascular: Denies palpitation, chest discomfort Gastrointestinal:  Denies nausea, heartburn or change in bowel habits Skin: Denies abnormal skin rashes Lymphatics: Denies new lymphadenopathy or easy bruising Neurological:Denies numbness, tingling or new weaknesses Behavioral/Psych: Mood is stable, no new changes  Extremities: No lower extremity edema Breast: Radiation dermatitis All other systems were reviewed with the patient and are negative.  I have reviewed the past medical history, past surgical history, social history and family history with the patient and they are unchanged from previous note.  ALLERGIES:  is allergic to penicillins; sulfa antibiotics; atorvastatin; and zetia [ezetimibe].  MEDICATIONS:  Current Outpatient Medications  Medication Sig Dispense Refill  . acetaminophen (TYLENOL) 500 MG tablet Take 500 mg by mouth daily as needed for moderate pain or headache.    . anastrozole (ARIMIDEX) 1 MG tablet Take 1 tablet (1 mg total) by mouth daily. 90 tablet 3  . aspirin 81 MG tablet Take 81 mg by mouth daily.      Marland Kitchen  Calcium Carbonate-Vitamin D (CALTRATE 600+D PO) Take 1 tablet by mouth 2 (two) times daily.    . diazepam (VALIUM) 5 MG tablet Take 1 tablet (5 mg total) by mouth every 12 (twelve) hours as needed for anxiety or muscle spasms. 30 tablet 0  . docusate sodium (COLACE) 100 MG capsule Take 100 mg by mouth at bedtime.     . hyaluronate sodium (RADIAPLEXRX) GEL Apply 1 application topically 2 (two) times daily as needed (soreness).    . sertraline (ZOLOFT) 100 MG tablet Take 200 mg by mouth daily.     . vitamin C (ASCORBIC ACID) 500 MG tablet Take 500 mg by mouth daily as needed (immune support).     No current facility-administered medications for this visit.     PHYSICAL EXAMINATION: ECOG PERFORMANCE STATUS: 1 - Symptomatic but completely ambulatory  Vitals:   01/14/18 1448  BP: (!) 107/52  Pulse: 94  Resp: 17  Temp: 97.8 F (36.6 C)  SpO2: 99%   Filed Weights   01/14/18 1448  Weight: 129 lb 11.2 oz (58.8 kg)    GENERAL:alert, no distress and comfortable SKIN: skin color, texture, turgor are normal, no rashes or significant lesions EYES: normal, Conjunctiva are pink and non-injected, sclera clear OROPHARYNX:no exudate, no erythema and lips, buccal mucosa, and tongue normal  NECK: supple, thyroid normal size, non-tender, without nodularity LYMPH:  no palpable lymphadenopathy in the cervical, axillary or inguinal LUNGS: clear to auscultation and percussion with normal breathing effort HEART: regular rate & rhythm and no murmurs and no lower extremity edema ABDOMEN:abdomen soft, non-tender and normal bowel sounds MUSCULOSKELETAL:no cyanosis of digits and no clubbing  NEURO: alert & oriented x 3 with fluent speech, no focal motor/sensory deficits EXTREMITIES: No lower extremity edema   LABORATORY DATA:  I have reviewed the data as listed CMP Latest Ref Rng & Units 01/14/2018 11/05/2017 10/28/2017  Glucose 70 - 140 mg/dL 106 104(H) 100  BUN 7 - 26 mg/dL 13 14 19   Creatinine 0.60 - 1.10 mg/dL 0.86 0.70 0.93  Sodium 136 - 145 mmol/L 138 136 135(L)  Potassium 3.5 - 5.1 mmol/L 4.2 3.8 3.8  Chloride 98 - 109 mmol/L 103 100(L) 102  CO2 22 - 29 mmol/L 27 - 23  Calcium 8.4 - 10.4 mg/dL 9.9 - 9.2  Total Protein 6.4 - 8.3 g/dL 7.3 - 6.8  Total Bilirubin 0.2 - 1.2 mg/dL 0.2 - 0.3  Alkaline Phos 40 -  150 U/L 181(H) - 75  AST 5 - 34 U/L 30 - 26  ALT 0 - 55 U/L 65(H) - 24    Lab Results  Component Value Date   WBC 4.2 01/14/2018   HGB 10.2 (L) 11/05/2017   HCT 36.3 01/14/2018   MCV 91.9 01/14/2018   PLT 161 01/14/2018   NEUTROABS 2.7 01/14/2018    ASSESSMENT & PLAN:  Malignant neoplasm of upper-inner quadrant of right breast in female, estrogen receptor positive ([place]) 08/06/2017 right mastectomy: Scattered foci of invasive lobular cancer, grade 2, 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 Mammaprint high risk: Dose since Adriamycin and Cytoxan x2 followed by Taxol weekly x1 09/23/2017-10/21/17 stopped for toxicities 2. adjuvant radiation therapy 11/28/17- 01/13/18 3. Followed by adjuvant antiestrogen therapy -------------------------------------------------------------------------------------------------------------------------------------- Plan: Anti estrogen therapy with anastrozole 1 mg daily starting 02/05/18  Anastrozole counseling: We discussed the risks and benefits of  anti-estrogen therapy with aromatase inhibitors. These include but not limited to insomnia, hot flashes, mood changes, vaginal dryness, bone density loss, and weight gain. We strongly believe that the benefits far outweigh the risks. Patient understands these risks and consented to starting treatment. Planned treatment duration is 5 years.   Survivorship: Encouraged her to exercise and get her trainer to help her get stronger. RTC in 3 months for SCP visit  No orders of the defined types were placed in this encounter.  The patient has a good understanding of the overall plan. she agrees with it. she will call with any problems that may develop before the next visit here.   Harriette Ohara, MD 01/14/18

## 2018-01-15 ENCOUNTER — Encounter (HOSPITAL_COMMUNITY)
Admission: RE | Admit: 2018-01-15 | Discharge: 2018-01-15 | Disposition: A | Discharge status: Home / Self Care | Payer: Medicare Other | Source: Ambulatory Visit | Attending: General Surgery | Admitting: General Surgery

## 2018-01-15 ENCOUNTER — Other Ambulatory Visit: Payer: Self-pay

## 2018-01-15 ENCOUNTER — Encounter (HOSPITAL_COMMUNITY): Payer: Self-pay

## 2018-01-15 DIAGNOSIS — Z4682 Encounter for fitting and adjustment of non-vascular catheter: Secondary | ICD-10-CM | POA: Insufficient documentation

## 2018-01-15 DIAGNOSIS — C50211 Malignant neoplasm of upper-inner quadrant of right female breast: Secondary | ICD-10-CM | POA: Insufficient documentation

## 2018-01-15 DIAGNOSIS — Z170 Estrogen receptor positive status [ER+]: Secondary | ICD-10-CM | POA: Insufficient documentation

## 2018-01-15 HISTORY — DX: Presence of external hearing-aid: Z97.4

## 2018-01-15 NOTE — Progress Notes (Signed)
Pt denies SOB, chest pain, and being under the care of a cardiologist. Pt stated that a stress test and cardiac cath were both performed > 10 years ago.

## 2018-01-15 NOTE — Pre-Procedure Instructions (Signed)
   Gloria Ware  01/15/2018     [id number] - [place], [place] [address] AT [address] [place] [address] Phone: [phone] Fax: [phone]   Your procedure is scheduled on Monday, January 20, 2018  Report to [facility] Admitting at 5:30 A.M.  Call this number if you have problems the morning of surgery:  [phone]   Remember: Drink Ensure Pre-Surgery before leaving home on day of surgery.  Do not eat food or drink liquids after midnight   Take these medicines the morning of surgery with A SIP OF WATER : if needed: Tylenol for pain ( pt wants to take morning medications after discharged) Stop taking Aspirin (unlesss instructed otherwise by surgeon), vitamins, fish oil and herbal medications  Do not take any NSAIDs ie: Ibuprofen, Advil, Naproxen (Aleve), Motrin, BC and Goody Powder; stop now.  Do not wear jewelry, make-up or nail polish.  Do not wear lotions, powders, or perfumes, or deodorant.  Do not shave 48 hours prior to surgery.   Do not bring valuables to the hospital.  [facility] is not responsible for any belongings or valuables.  Contacts, dentures or bridgework may not be worn into surgery.  Leave your suitcase in the car.  After surgery it may be brought to your room. For patients admitted to the hospital, discharge time will be determined by your treatment team. Patients discharged the day of surgery will not be allowed to drive home.  Special instructions: Shower the night before surgery and the morning of surgery with CHG. Please read over the following fact sheets that you were given. Pain Booklet, Coughing and Deep Breathing and Surgical Site Infection Prevention

## 2018-01-16 ENCOUNTER — Encounter: Payer: Self-pay | Admitting: Radiation Oncology

## 2018-01-16 DIAGNOSIS — F411 Generalized anxiety disorder: Secondary | ICD-10-CM | POA: Diagnosis not present

## 2018-01-16 NOTE — Progress Notes (Signed)
  Radiation Oncology         [phone] ________________________________  Name: Gloria Ware MRN: [id number]  Date: 01/16/2018  DOB: [birth year]  End of Treatment Note  Diagnosis: Stage IB, pT1bN1M0, grade 2, ER/PR positive invasive lobular carcinoma with pleomorphic changes of the right breast.     Indication for treatment: Curative      Radiation treatment dates:  11/28/17-01/13/18  Site/dose: 1) Right chest wall/ 50.4 Gy in 28 fractions 2) Right chest wall boost/ 10 Gy in 5 fractions  Beams/energy: 1) 3D/ 10X, 15X, 6X 2) Electron/ 6E  Narrative: The patient tolerated radiation treatment relatively well. The patient had erythema and hyperpigmentation to the treatment area with radiation dermitis. The patient complained of a dull pain throughout the right breast as well as fatigue.  Plan: The patient has completed radiation treatment. The patient will return to radiation oncology clinic for routine followup in one month. I advised them to call or return sooner if they have any questions or concerns related to their recovery or treatment.  ------------------------------------------------  Jodelle Gross, MD, PhD  This document serves as a record of services personally performed by Kyung Rudd, MD. It was created on his behalf by Bethann Humble, a trained medical scribe. The creation of this record is based on the scribe's personal observations and the provider's statements to them. This document has been checked and approved by the attending provider.

## 2018-01-19 NOTE — Anesthesia Preprocedure Evaluation (Signed)
Anesthesia Evaluation  Patient identified by MRN, date of birth, ID band Patient awake    Reviewed: Allergy & Precautions, NPO status , Patient's Chart, lab work & pertinent test results  History of Anesthesia Complications (+) PONV and PROLONGED EMERGENCENegative for: history of anesthetic complications  Airway Mallampati: II  TM Distance: >3 FB Neck ROM: Full    Dental  (+) Teeth Intact, Dental Advisory Given   Pulmonary neg pulmonary ROS,    Pulmonary exam normal        Cardiovascular negative cardio ROS Normal cardiovascular exam  Study Conclusions  - Left ventricle: Global longitudinal strain is -22.5% The cavity   size was normal. Wall thickness was normal. Systolic function was   vigorous. The estimated ejection fraction was in the range of 65%   to 70%.    Neuro/Psych PSYCHIATRIC DISORDERS Anxiety Depression negative neurological ROS     GI/Hepatic negative GI ROS, Neg liver ROS,   Endo/Other  negative endocrine ROS  Renal/GU negative Renal ROS     Musculoskeletal   Abdominal   Peds  Hematology negative hematology ROS (+)   Anesthesia Other Findings   Reproductive/Obstetrics negative OB ROS                             Anesthesia Physical  Anesthesia Plan  ASA: III  Anesthesia Plan: MAC   Post-op Pain Management:    Induction: Intravenous  PONV Risk Score and Plan: 3 and Ondansetron, Dexamethasone, Diphenhydramine and Treatment may vary due to age or medical condition  Airway Management Planned: Natural Airway and Nasal Cannula  Additional Equipment:   Intra-op Plan:   Post-operative Plan: Extubation in OR  Informed Consent: I have reviewed the patients History and Physical, chart, labs and discussed the procedure including the risks, benefits and alternatives for the proposed anesthesia with the patient or authorized representative who has indicated his/her  understanding and acceptance.   Dental advisory given  Plan Discussed with: Anesthesiologist, CRNA and Surgeon  Anesthesia Plan Comments:         Anesthesia Quick Evaluation

## 2018-01-20 ENCOUNTER — Encounter (HOSPITAL_COMMUNITY): Payer: Self-pay | Admitting: *Deleted

## 2018-01-20 ENCOUNTER — Ambulatory Visit (HOSPITAL_COMMUNITY): Payer: Medicare Other | Admitting: Anesthesiology

## 2018-01-20 ENCOUNTER — Ambulatory Visit (HOSPITAL_COMMUNITY)
Admission: RE | Admit: 2018-01-20 | Discharge: 2018-01-20 | Disposition: A | Discharge status: Home / Self Care | Payer: Medicare Other | Source: Ambulatory Visit | Attending: General Surgery | Admitting: General Surgery

## 2018-01-20 ENCOUNTER — Encounter (HOSPITAL_COMMUNITY)
Admission: RE | Disposition: A | Discharge status: Home / Self Care | Payer: Self-pay | Source: Ambulatory Visit | Attending: General Surgery

## 2018-01-20 DIAGNOSIS — F429 Obsessive-compulsive disorder, unspecified: Secondary | ICD-10-CM | POA: Insufficient documentation

## 2018-01-20 DIAGNOSIS — E860 Dehydration: Secondary | ICD-10-CM | POA: Diagnosis not present

## 2018-01-20 DIAGNOSIS — C50211 Malignant neoplasm of upper-inner quadrant of right female breast: Secondary | ICD-10-CM | POA: Diagnosis not present

## 2018-01-20 DIAGNOSIS — Z803 Family history of malignant neoplasm of breast: Secondary | ICD-10-CM | POA: Diagnosis not present

## 2018-01-20 DIAGNOSIS — F329 Major depressive disorder, single episode, unspecified: Secondary | ICD-10-CM | POA: Diagnosis not present

## 2018-01-20 DIAGNOSIS — Z853 Personal history of malignant neoplasm of breast: Secondary | ICD-10-CM | POA: Insufficient documentation

## 2018-01-20 DIAGNOSIS — Z79899 Other long term (current) drug therapy: Secondary | ICD-10-CM | POA: Insufficient documentation

## 2018-01-20 DIAGNOSIS — F419 Anxiety disorder, unspecified: Secondary | ICD-10-CM | POA: Diagnosis not present

## 2018-01-20 DIAGNOSIS — G4700 Insomnia, unspecified: Secondary | ICD-10-CM | POA: Diagnosis not present

## 2018-01-20 DIAGNOSIS — Z9011 Acquired absence of right breast and nipple: Secondary | ICD-10-CM | POA: Diagnosis not present

## 2018-01-20 DIAGNOSIS — Z7982 Long term (current) use of aspirin: Secondary | ICD-10-CM | POA: Insufficient documentation

## 2018-01-20 DIAGNOSIS — Z452 Encounter for adjustment and management of vascular access device: Secondary | ICD-10-CM | POA: Diagnosis not present

## 2018-01-20 DIAGNOSIS — Z9221 Personal history of antineoplastic chemotherapy: Secondary | ICD-10-CM | POA: Diagnosis not present

## 2018-01-20 HISTORY — PX: PORT-A-CATH REMOVAL: SHX5289

## 2018-01-20 SURGERY — REMOVAL PORT-A-CATH
Anesthesia: Monitor Anesthesia Care | Site: Chest | Laterality: Left

## 2018-01-20 MED ORDER — GABAPENTIN 300 MG PO CAPS
300.0000 mg | ORAL_CAPSULE | ORAL | Status: AC
  Administered 2018-01-20: 300 mg via ORAL

## 2018-01-20 MED ORDER — BUPIVACAINE-EPINEPHRINE (PF) 0.25% -1:200000 IJ SOLN
INTRAMUSCULAR | Status: AC
  Filled 2018-01-20: qty 30

## 2018-01-20 MED ORDER — PROPOFOL 10 MG/ML IV BOLUS
INTRAVENOUS | Status: AC
Start: 2018-01-20 — End: ?
  Filled 2018-01-20: qty 40

## 2018-01-20 MED ORDER — FENTANYL CITRATE (PF) 100 MCG/2ML IJ SOLN: 25.0000 ug | INTRAMUSCULAR | Status: DC | PRN

## 2018-01-20 MED ORDER — ACETAMINOPHEN 500 MG PO TABS
ORAL_TABLET | ORAL | Status: AC
  Filled 2018-01-20: qty 2

## 2018-01-20 MED ORDER — CHLORHEXIDINE GLUCONATE CLOTH 2 % EX PADS: 6.0000 | MEDICATED_PAD | Freq: Once | CUTANEOUS | Status: DC

## 2018-01-20 MED ORDER — MEPERIDINE HCL 50 MG/ML IJ SOLN: 6.2500 mg | INTRAMUSCULAR | Status: DC | PRN

## 2018-01-20 MED ORDER — FENTANYL CITRATE (PF) 250 MCG/5ML IJ SOLN
INTRAMUSCULAR | Status: AC
  Filled 2018-01-20: qty 5

## 2018-01-20 MED ORDER — GABAPENTIN 300 MG PO CAPS
ORAL_CAPSULE | ORAL | Status: AC
  Filled 2018-01-20: qty 1

## 2018-01-20 MED ORDER — PROPOFOL 10 MG/ML IV BOLUS
INTRAVENOUS | Status: DC | PRN
  Administered 2018-01-20: 10 mg via INTRAVENOUS
  Administered 2018-01-20 (×2): 20 mg via INTRAVENOUS
  Administered 2018-01-20: 10 mg via INTRAVENOUS

## 2018-01-20 MED ORDER — BUPIVACAINE-EPINEPHRINE (PF) 0.25% -1:200000 IJ SOLN
INTRAMUSCULAR | Status: DC | PRN
  Administered 2018-01-20: 10 mL via INTRADERMAL

## 2018-01-20 MED ORDER — BACITRACIN ZINC 500 UNIT/GM EX OINT
TOPICAL_OINTMENT | CUTANEOUS | Status: AC
  Filled 2018-01-20: qty 28.35

## 2018-01-20 MED ORDER — ACETAMINOPHEN 500 MG PO TABS
1000.0000 mg | ORAL_TABLET | ORAL | Status: AC
  Administered 2018-01-20: 1000 mg via ORAL

## 2018-01-20 MED ORDER — FENTANYL CITRATE (PF) 100 MCG/2ML IJ SOLN
INTRAMUSCULAR | Status: DC | PRN
  Administered 2018-01-20: 50 ug via INTRAVENOUS

## 2018-01-20 MED ORDER — CEFAZOLIN SODIUM-DEXTROSE 2-4 GM/100ML-% IV SOLN
INTRAVENOUS | Status: AC
  Filled 2018-01-20: qty 100

## 2018-01-20 MED ORDER — PHENYLEPHRINE HCL 10 MG/ML IJ SOLN
INTRAMUSCULAR | Status: DC | PRN
  Administered 2018-01-20: 40 ug via INTRAVENOUS

## 2018-01-20 MED ORDER — 0.9 % SODIUM CHLORIDE (POUR BTL) OPTIME
TOPICAL | Status: DC | PRN
  Administered 2018-01-20: 1000 mL

## 2018-01-20 MED ORDER — LACTATED RINGERS IV SOLN
INTRAVENOUS | Status: DC | PRN
  Administered 2018-01-20: 08:00:00 via INTRAVENOUS

## 2018-01-20 MED ORDER — CIPROFLOXACIN IN D5W 400 MG/200ML IV SOLN
400.0000 mg | INTRAVENOUS | Status: AC
  Administered 2018-01-20: 400 mg via INTRAVENOUS
  Filled 2018-01-20: qty 200

## 2018-01-20 MED ORDER — LIDOCAINE HCL 1 % IJ SOLN
INTRAMUSCULAR | Status: AC
  Filled 2018-01-20: qty 20

## 2018-01-20 MED ORDER — CELECOXIB 200 MG PO CAPS
ORAL_CAPSULE | ORAL | Status: AC
  Filled 2018-01-20: qty 1

## 2018-01-20 SURGICAL SUPPLY — 35 items
ADH SKN CLS APL DERMABOND .7 (GAUZE/BANDAGES/DRESSINGS) ×1
BINDER BREAST LRG (GAUZE/BANDAGES/DRESSINGS) ×2 IMPLANT
BLADE SURG 15 STRL LF DISP TIS (BLADE) ×1 IMPLANT
BLADE SURG 15 STRL SS (BLADE) ×2
CHLORAPREP W/TINT 10.5 ML (MISCELLANEOUS) ×2 IMPLANT
COVER SURGICAL LIGHT HANDLE (MISCELLANEOUS) ×2 IMPLANT
DECANTER SPIKE VIAL GLASS SM (MISCELLANEOUS) ×4 IMPLANT
DERMABOND ADHESIVE PROPEN (GAUZE/BANDAGES/DRESSINGS) ×1
DERMABOND ADVANCED (GAUZE/BANDAGES/DRESSINGS) ×1
DERMABOND ADVANCED .7 DNX12 (GAUZE/BANDAGES/DRESSINGS) ×1 IMPLANT
DERMABOND ADVANCED .7 DNX6 (GAUZE/BANDAGES/DRESSINGS) ×1 IMPLANT
DRAPE LAPAROTOMY 100X72 PEDS (DRAPES) ×2 IMPLANT
DRAPE UTILITY XL STRL (DRAPES) ×4 IMPLANT
ELECT CAUTERY BLADE 6.4 (BLADE) ×2 IMPLANT
ELECT REM PT RETURN 9FT ADLT (ELECTROSURGICAL) ×2
ELECTRODE REM PT RTRN 9FT ADLT (ELECTROSURGICAL) ×1 IMPLANT
GAUZE SPONGE 4X4 16PLY XRAY LF (GAUZE/BANDAGES/DRESSINGS) ×2 IMPLANT
GLOVE BIO SURGEON STRL SZ 6 (GLOVE) ×2 IMPLANT
GLOVE INDICATOR 6.5 STRL GRN (GLOVE) ×2 IMPLANT
GOWN STRL REUS W/ TWL LRG LVL3 (GOWN DISPOSABLE) ×1 IMPLANT
GOWN STRL REUS W/TWL 2XL LVL3 (GOWN DISPOSABLE) ×2 IMPLANT
GOWN STRL REUS W/TWL LRG LVL3 (GOWN DISPOSABLE) ×2
KIT BASIN OR (CUSTOM PROCEDURE TRAY) ×2 IMPLANT
KIT TURNOVER KIT B (KITS) ×2 IMPLANT
NEEDLE HYPO 25GX1X1/2 BEV (NEEDLE) ×2 IMPLANT
NS IRRIG 1000ML POUR BTL (IV SOLUTION) ×2 IMPLANT
PACK SURGICAL SETUP 50X90 (CUSTOM PROCEDURE TRAY) ×2 IMPLANT
PAD ARMBOARD 7.5X6 YLW CONV (MISCELLANEOUS) ×4 IMPLANT
PENCIL BUTTON HOLSTER BLD 10FT (ELECTRODE) ×2 IMPLANT
SUT MON AB 4-0 PC3 18 (SUTURE) ×2 IMPLANT
SUT VIC AB 3-0 SH 27 (SUTURE) ×1
SUT VIC AB 3-0 SH 27X BRD (SUTURE) ×1 IMPLANT
SYR CONTROL 10ML LL (SYRINGE) ×2 IMPLANT
TOWEL OR 17X24 6PK STRL BLUE (TOWEL DISPOSABLE) ×2 IMPLANT
TOWEL OR 17X26 10 PK STRL BLUE (TOWEL DISPOSABLE) ×2 IMPLANT

## 2018-01-20 NOTE — Discharge Instructions (Addendum)
[facility] Office Phone Number [phone]   POST OP INSTRUCTIONS  Always review your discharge instruction sheet given to you by the facility where your surgery was performed.  IF YOU HAVE DISABILITY OR FAMILY LEAVE FORMS, YOU MUST BRING THEM TO THE OFFICE FOR PROCESSING.  DO NOT GIVE THEM TO YOUR DOCTOR.  1. A prescription for pain medication may be given to you upon discharge.  Take your pain medication as prescribed, if needed.  If narcotic pain medicine is not needed, then you may take acetaminophen (Tylenol) or ibuprofen (Advil) as needed. 2. Take your usually prescribed medications unless otherwise directed 3. If you need a refill on your pain medication, please contact your pharmacy.  They will contact our office to request authorization.  Prescriptions will not be filled after 5pm or on week-ends. 4. You should eat very light the first 24 hours after surgery, such as soup, crackers, pudding, etc.  Resume your normal diet the day after surgery 5. It is common to experience some constipation if taking pain medication after surgery.  Increasing fluid intake and taking a stool softener will usually help or prevent this problem from occurring.  A mild laxative (Milk of Magnesia or Miralax) should be taken according to package directions if there are no bowel movements after 48 hours. 6. You may shower in 48 hours.  The surgical glue will flake off in 2-3 weeks.   7. ACTIVITIES:  No strenuous activity or heavy lifting for 1 week.   a. You may drive when you no longer are taking prescription pain medication, you can comfortably wear a seatbelt, and you can safely maneuver your car and apply brakes. b. RETURN TO WORK:  __________as tolerated._______________ You should see your doctor in the office for a follow-up appointment approximately three-four weeks after your surgery.    WHEN TO CALL YOUR DOCTOR: 1. Fever over 101.0 2. Nausea and/or vomiting. 3. Extreme swelling or  bruising. 4. Continued bleeding from incision. 5. Increased pain, redness, or drainage from the incision.  The clinic staff is available to answer your questions during regular business hours.  Please don't hesitate to call and ask to speak to one of the nurses for clinical concerns.  If you have a medical emergency, go to the nearest emergency room or call 911.  A surgeon from [facility] is always on call at the hospital.  For further questions, please visit [email or web address]   

## 2018-01-20 NOTE — H&P (Signed)
Gloria Ware is an 72 y.o. female.   Chief Complaint: port in place HPI: Pt is s/p right mastectomy with seed targeted axillary lymph node biopsy/SLN bx.  Her mammaprint was high risk.  She received some of her chemotherapy, but had significant side effects.  She is here for port removal.    Past Medical History:  Diagnosis Date  . Anxiety   . Breast CA ([place])   . Colon polyp 09/12/2005  . Depression   . Eczema    right forearm   . Family history of breast cancer   . Genetic testing 06/27/2017   Multi-Cancer panel (83 genes) @ [facility] - No pathogenic mutations detected  . Hearing aid worn    B/L  . Hearing loss   . High cholesterol   . Injury     left foot metatarsal fracture sustined 08-03-17 , treated with boot; patient  reports  improvement as of today  . OCD (obsessive compulsive disorder)   . Osteopenia 11/2016   T score -2.1 stable from prior DEXA  . PONV (postoperative nausea and vomiting)    post op nausea; took a long time waking up  . Tinnitus     Past Surgical History:  Procedure Laterality Date  . CARDIAC CATHETERIZATION     husband was having symtpoms and needed a stent; subsequently she states she started " to feel something"  but thinks it was " all in my head bc of my hbsand". subsequently saw same cardiologist as her husband who sent for treadmill stress that had inconcluisicve results so she was sent for cardiac catherization which she states was " definitely negative "   . cataract surg    . COLONOSCOPY     with polypectomy  . DILATION AND CURETTAGE OF UTERUS    . IR CV LINE INJECTION  09/30/2017  . MASTECTOMY W/ SENTINEL NODE BIOPSY Right 08/06/2017   Procedure: RIGHT MASTECTOMY WITH SENTINEL LYMPH NODE BIOPSY;  Surgeon: [name], MD;  Location: [facility];  Service: General;  Laterality: Right;  . MOUTH SURGERY    . PORTACATH PLACEMENT N/A 09/12/2017   Procedure: INSERTION PORT-A-CATH;  Surgeon: [name], MD;  Location: [facility];   Service: General;  Laterality: N/A;  . RADIOACTIVE SEED GUIDED AXILLARY SENTINEL LYMPH NODE Right 08/06/2017   Procedure: SEED TARGETED AXILLARY LYMPH NODE EXCISION;  Surgeon: [name], MD;  Location: [facility];  Service: General;  Laterality: Right;    Family History  Problem Relation Age of Onset  . Breast cancer Sister 6       currently 86  . Hypertension Maternal Grandmother   . Stroke Maternal Grandmother   . Hypertension Maternal Grandfather   . Stroke Maternal Grandfather   . Breast cancer Cousin        unconfirmed in maternal first cousin  . Ovarian cancer Cousin        unconfirmed if ovarian vs. uterine in paternal first cousin   Social History:  reports that she has never smoked. She has never used smokeless tobacco. She reports that she does not drink alcohol or use drugs.  Allergies:  Allergies  Allergen Reactions  . Penicillins Other (See Comments)    Throat burning Has patient had a PCN reaction causing immediate rash, facial/tongue/throat swelling, SOB or lightheadedness with hypotension: No Has patient had a PCN reaction causing severe rash involving mucus membranes or skin necrosis: No Has patient had a PCN reaction that required hospitalization: No Has  patient had a PCN reaction occurring within the last 10 years: No If all of the above answers are "NO", then may proceed with Cephalosporin use.   . Sulfa Antibiotics Nausea And Vomiting  . Atorvastatin      Muscle aches   . Zetia [Ezetimibe] Other (See Comments)    Muscle aches     Medications Prior to Admission  Medication Sig Dispense Refill  . acetaminophen (TYLENOL) 500 MG tablet Take 500 mg by mouth daily as needed for moderate pain or headache.    Marland Kitchen aspirin 81 MG tablet Take 81 mg by mouth daily.      . Calcium Carbonate-Vitamin D (CALTRATE 600+D PO) Take 1 tablet by mouth 2 (two) times daily.    . diazepam (VALIUM) 5 MG tablet Take 1 tablet (5 mg total) by mouth every 12  (twelve) hours as needed for anxiety or muscle spasms. 30 tablet 0  . docusate sodium (COLACE) 100 MG capsule Take 100 mg by mouth at bedtime.    . hyaluronate sodium (RADIAPLEXRX) GEL Apply 1 application topically 2 (two) times daily as needed (soreness).    . sertraline (ZOLOFT) 100 MG tablet Take 200 mg by mouth daily.     . vitamin C (ASCORBIC ACID) 500 MG tablet Take 500 mg by mouth daily as needed (immune support).    Marland Kitchen anastrozole (ARIMIDEX) 1 MG tablet Take 1 tablet (1 mg total) by mouth daily. 90 tablet 3    No results found for this or any previous visit (from the past 48 hour(s)). No results found.  Review of Systems  All other systems reviewed and are negative.   Blood pressure 109/62, pulse 72, temperature (!) 97.5 F (36.4 C), temperature source Oral, resp. rate 18, weight 58.1 kg (128 lb), last menstrual period 07/30/1998, SpO2 99 %. Physical Exam  Constitutional: She is oriented to person, place, and time. She appears well-developed and well-nourished. No distress.  HENT:  Head: Normocephalic and atraumatic.  Eyes: Pupils are equal, round, and reactive to light. Conjunctivae are normal.  Cardiovascular: Normal rate.  Respiratory: Effort normal.  Some hyperpigmentation and raw areas on right  Neurological: She is alert and oriented to person, place, and time.  Skin: Skin is warm and dry. She is not diaphoretic.  Ridged nails  Psychiatric: She has a normal mood and affect. Her behavior is normal. Judgment and thought content normal.     Assessment/Plan Port in place Right breast cancer  Plan port removal. Discussed procedure with patient and husband.  They wish to proceed.    [name], MD 01/20/2018, 8:00 AM

## 2018-01-20 NOTE — Anesthesia Postprocedure Evaluation (Signed)
Anesthesia Post Note  Patient: Gloria Ware  Procedure(s) Performed: REMOVAL PORT-A-CATH (Left Chest)     Patient location during evaluation: PACU Anesthesia Type: MAC Level of consciousness: awake and alert Pain management: pain level controlled Vital Signs Assessment: post-procedure vital signs reviewed and stable Respiratory status: spontaneous breathing, nonlabored ventilation, respiratory function stable and patient connected to nasal cannula oxygen Cardiovascular status: stable and blood pressure returned to baseline Postop Assessment: no apparent nausea or vomiting Anesthetic complications: no    Last Vitals:  Vitals:   01/20/18 0919 01/20/18 0925  BP:  (!) 112/54  Pulse: 73 77  Resp: 16 15  Temp: (!) 36.2 C   SpO2: 96% 98%    Last Pain:  Vitals:   01/20/18 0925  TempSrc:   PainSc: 0-No pain                 Meghna Hagmann

## 2018-01-20 NOTE — Transfer of Care (Signed)
Immediate Anesthesia Transfer of Care Note  Patient: Gloria Ware  Procedure(s) Performed: REMOVAL PORT-A-CATH (Left Chest)  Patient Location: PACU  Anesthesia Type:MAC  Level of Consciousness: awake  Airway & Oxygen Therapy: Patient Spontanous Breathing and Patient connected to face mask oxygen  Post-op Assessment: Report given to RN and Post -op Vital signs reviewed and stable  Post vital signs: Reviewed and stable  Last Vitals:  Vitals Value Taken Time  BP 108/50 01/20/2018  8:56 AM  Temp 36.2 C 01/20/2018  8:56 AM  Pulse 78 01/20/2018  9:08 AM  Resp 13 01/20/2018  9:08 AM  SpO2 96 % 01/20/2018  9:08 AM  Vitals shown include unvalidated device data.  Last Pain:  Vitals:   01/20/18 0630  TempSrc: Oral  PainSc:       Patients Stated Pain Goal: 3 (02/63/78 5885)  Complications: No apparent anesthesia complications

## 2018-01-20 NOTE — Op Note (Signed)
  PRE-OPERATIVE DIAGNOSIS:  un-needed Port-A-Cath for right breast cancer  POST-OPERATIVE DIAGNOSIS:  Same   PROCEDURE:  Procedure(s):  REMOVAL PORT-A-CATH  SURGEON:  Surgeon(s):  [name], MD  ANESTHESIA:   MAC + local  EBL:   Minimal  SPECIMEN:  None  Complications : none known  Procedure:   Pt was  identified in the holding area and taken to the operating room where she was placed supine on the operating room table.  MAC anesthesia was induced.  The left upper chest was prepped and draped.  The prior incision was anesthetized with local anesthetic.  The incision was opened with a #15 blade.  The subcutaneous tissue was divided with the cautery.  The port was identified and the capsule opened.  The four 2-0 prolene sutures were removed.  The port was then removed and pressure held on the tract.  The catheter appeared intact without evidence of breakage, length was 20.5 cm.  The wound was inspected for hemostasis, which was achieved with cautery.  The wound was closed with 3-0 vicryl deep dermal interrupted sutures and 4-0 Monocryl running subcuticular suture.  The wound was cleaned, dried, and dressed with dermabond.  The patient was awakened from anesthesia and taken to the PACU in stable condition.  Needle, sponge, and instrument counts are correct.

## 2018-01-21 ENCOUNTER — Encounter (HOSPITAL_COMMUNITY): Payer: Self-pay | Admitting: General Surgery

## 2018-01-23 ENCOUNTER — Telehealth: Payer: Self-pay | Admitting: Medical Oncology

## 2018-01-23 NOTE — Telephone Encounter (Signed)
Pt concerned about elevated Alkaline Phos and ALT. She went online to look up tests. She remembers Dr Lindi Adie not being alarmed . I told her not to be alarmed because they were not that much higher than upper limit of normal and that  I would send her message to Dr Lindi Adie .

## 2018-02-03 ENCOUNTER — Encounter: Payer: Self-pay | Admitting: Physical Therapy

## 2018-02-03 ENCOUNTER — Ambulatory Visit: Payer: Medicare Other | Admitting: Physical Therapy

## 2018-02-03 DIAGNOSIS — M6281 Muscle weakness (generalized): Secondary | ICD-10-CM | POA: Diagnosis not present

## 2018-02-03 DIAGNOSIS — M25611 Stiffness of right shoulder, not elsewhere classified: Secondary | ICD-10-CM

## 2018-02-03 DIAGNOSIS — R262 Difficulty in walking, not elsewhere classified: Secondary | ICD-10-CM

## 2018-02-03 DIAGNOSIS — Z170 Estrogen receptor positive status [ER+]: Secondary | ICD-10-CM | POA: Diagnosis not present

## 2018-02-03 DIAGNOSIS — C50211 Malignant neoplasm of upper-inner quadrant of right female breast: Secondary | ICD-10-CM | POA: Diagnosis not present

## 2018-02-03 DIAGNOSIS — R293 Abnormal posture: Secondary | ICD-10-CM | POA: Diagnosis not present

## 2018-02-03 NOTE — Therapy (Signed)
[facility] [place], [place], [address] Phone: [phone]   Fax:  [phone]  Physical Therapy Treatment  Patient Details  Name: Gloria Ware MRN: [id number] Date of Birth: 17-Apr-1946 Referring Provider: Dr. [name]   Encounter Date: 02/03/2018  PT End of Session - 02/03/18 1348    Visit Number  27    Number of Visits  33    Date for PT Re-Evaluation  01/29/18    PT Start Time  0258    PT Stop Time  1345    PT Time Calculation (min)  42 min    Activity Tolerance  Patient tolerated treatment well    Behavior During Therapy  [facility] for tasks assessed/performed       Past Medical History:  Diagnosis Date  . Anxiety   . Breast CA ([place])   . Colon polyp 09/12/2005  . Depression   . Eczema    right forearm   . Family history of breast cancer   . Genetic testing 06/27/2017   Multi-Cancer panel (83 genes) @ [facility] - No pathogenic mutations detected  . Hearing aid worn    B/L  . Hearing loss   . High cholesterol   . Injury     left foot metatarsal fracture sustined 08-03-17 , treated with boot; patient  reports  improvement as of today  . OCD (obsessive compulsive disorder)   . Osteopenia 11/2016   T score -2.1 stable from prior DEXA  . PONV (postoperative nausea and vomiting)    post op nausea; took a long time waking up  . Tinnitus     Past Surgical History:  Procedure Laterality Date  . CARDIAC CATHETERIZATION     husband was having symtpoms and needed a stent; subsequently she states she started " to feel something"  but thinks it was " all in my head bc of my hbsand". subsequently saw same cardiologist as her husband who sent for treadmill stress that had inconcluisicve results so she was sent for cardiac catherization which she states was " definitely negative "   . cataract surg    . COLONOSCOPY     with polypectomy  . DILATION AND CURETTAGE OF UTERUS    . IR CV LINE INJECTION   09/30/2017  . MASTECTOMY W/ SENTINEL NODE BIOPSY Right 08/06/2017   Procedure: RIGHT MASTECTOMY WITH SENTINEL LYMPH NODE BIOPSY;  Surgeon: [name], MD;  Location: [facility];  Service: General;  Laterality: Right;  . MOUTH SURGERY    . PORT-A-CATH REMOVAL Left 01/20/2018   Procedure: REMOVAL PORT-A-CATH;  Surgeon: [name], MD;  Location: [facility];  Service: General;  Laterality: Left;  . PORTACATH PLACEMENT N/A 09/12/2017   Procedure: INSERTION PORT-A-CATH;  Surgeon: [name], MD;  Location: [facility];  Service: General;  Laterality: N/A;  . RADIOACTIVE SEED GUIDED AXILLARY SENTINEL LYMPH NODE Right 08/06/2017   Procedure: SEED TARGETED AXILLARY LYMPH NODE EXCISION;  Surgeon: [name], MD;  Location: [facility];  Service: General;  Laterality: Right;    There were no vitals filed for this visit.  Subjective Assessment - 02/03/18 1306    Subjective  Everything is going well. I have been going to the gym to do the bike. I have been walking 45 min and able to do hills. I am trying to keep that up. I am doing stretches for my arm. I notice I am not so stiff in the morning. I am still  having a little redness where they gave me the boost but it is healing. 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 It measured 1 cm and is located in the upper inner quadrant. 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 She underwent a right mastectomy and sentinel node biopsy on 08/06/17. She is wearing a boot on her left foot due to a fall and foot fracture 08/04/17. Her orthopedist for her foot is Dr. Doran Durand.     Patient Stated Goals  Make sure my arm is ok and that I'm strong enough for chemotherapy.    Currently in Pain?  No/denies    Pain Score  0-No pain                Gloria Ware - 02/03/18 0001    Open a tight or new jar  Mild difficulty    Do heavy household chores (wash walls, wash floors)  No difficulty    Carry a shopping bag or briefcase  Mild difficulty    Wash your back  No difficulty    Use a knife to cut food  No difficulty    Recreational activities in which you take some force or impact through your arm, shoulder, or hand (golf, hammering, tennis)  Mild difficulty    During the past week, to what extent has your arm, shoulder or hand problem interfered with your normal social activities with family, friends, neighbors, or groups?  Not at all    During the past week, to what extent has your arm, shoulder or hand problem limited your work or other regular daily activities  Not at all    Arm, shoulder, or hand pain.  Mild    Tingling (pins and needles) in your arm, shoulder, or hand  None    Difficulty Sleeping  No difficulty    DASH Score  9.09 %             [facility] Adult PT Treatment/Exercise - 02/03/18 0001      Knee/Hip Exercises: Aerobic   Stationary Bike  Level1 x 81mn unit did not turn off at all this session      Knee/Hip Exercises: Seated   Long Arc Quad  Strengthening;2 sets;10 reps;Weights    Long Arc Quad Weight  2 lbs.    Marching  Strengthening;2 sets;10 reps;Weights    Marching Limitations  2    Sit to [employer] 15 reps                   Breast Clinic Goals - 06/26/17 2119      Patient will be able to verbalize understanding of pertinent lymphedema risk reduction practices relevant to her diagnosis specifically related to skin care.   Time  1    Period  Days    Status  Achieved      Patient will be able to return demonstrate and/or verbalize understanding of the post-op home exercise program related to regaining shoulder range of motion.   Time  1    Period  Days    Status  Achieved      Patient will be able to verbalize understanding of the importance of attending the  postoperative After Breast Cancer Class for further lymphedema  risk reduction education and therapeutic exercise.   Time  1    Period  Days    Status  Achieved       Long Term Clinic Goals - 02/03/18 1316      CC Long Term Goal  #1   Title  Patient will perform home exercise program correctly and independently.    Time  8    Period  Weeks    Status  Achieved      CC Long Term Goal  #2   Title  Patient will demonstrate right shoulder active flexion at >/= 130 degrees for increased ease reaching overhead.    Baseline  111 degrees today, 11/01/17- 125 degrees, 12/04/17- 132 degrees    Time  8    Period  Weeks    Status  Achieved      CC Long Term Goal  #3   Title  Patient will demonstrate right shoulder active abduction at >/= 137 degrees for increased ease reaching overhead.    Baseline  125 degrees today, 11/01/17- 121 degrees, 12/04/17- 115 degrees; 121 degrees-12/25/17, 01/01/18- 121    Time  8    Period  Weeks    Status  Achieved      CC Long Term Goal  #4   Title  Patient will report she is able to participate in a regular walking program for 30 minutes, 5-6 days per week for reducing recurrence risk and better tolerance of chemotherapy.    Baseline  Unable to walk long distance with boot on., 11/01/17- pt has been unable to walk due to shakiness, 12/04/17- pt now has a rollator and can walk around in her house with it, she has just started walking outside since her shakiness is more controlled; Have walked for 35 mins since Thursday except Mon when I had PT-12/25/17, 01/01/18- pt is able to complete this now but has not in the past few days because she has been playing 2 concerts a day    Time  8    Period  Weeks    Status  Achieved      CC Long Term Goal  #5   Title  Patient will verbalize understanding of risk reduction practices related to lymphedema.    Baseline  11/01/17- pt required some cueing for this, 12/04/17- pt able to verbalize this    Time  8    Period  Weeks    Status   Achieved      CC Long Term Goal  #6   Title  Quick DASH score reduced to </= 4 for improved function of right arm.    Baseline  01/01/18- 20.45, 02/03/18- 9.09    Time  8    Period  Weeks    Status  Partially Met      CC Long Term Goal  #7   Title  Pt will be able to sit to stand 12x in 30 seconds to decrease risk of falls    Baseline  6; 13x in 30 sec- 12/25/17    Time  4    Period  Weeks    Status  Achieved      CC Long Term Goal  #8   Title  Pt to be able to complete TUG in less than 13 seconds without use of UEs to decrease risk of falls    Baseline  25 seconds without use of UEs; 10 sec- 12/25/17    Time  4    Period  Weeks  Status  Achieved      Additional Goal #1   Title  Pt will be able to pedal the recumbent bike x 10 minutes without it turning off after 4 week independent trial for HEP to demonstrate improved endurance    Baseline  around 4 minutes, 02/03/18- pt able to complete 10 min without the machine turning off    Time  4    Period  Weeks    Status  Achieved         Plan - 02/03/18 1348    Clinical Impression Statement  Assessed pt's progress towards goals since she has been on hold for four weeks to see how she manages independently with her HEP and going to the gym. Pt has now met all of her goals. She was able to keep the bike on and able to keep up a good pace for 10 minutes with no difficulty today which she has never been able to do before. Her skin is healing and now there is a just a pink area with 1 scab intact. She has been consistently going to the [facility] and doing her home exercise program. She has been walking 45 a day on hilly terrain. She was just shy of meeting her Gloria Ware goal but pt reports that some of her arm pain is chronic and was there before surgery. Overall her Quick Dash score has greatly improved. Pt will be discharged from skilled PT services at this time.     Rehab Potential  Excellent    Clinical Impairments Affecting Rehab Potential   chemo and radiation completed    PT Frequency  1x / week    PT Duration  4 weeks    PT Treatment/Interventions  Therapeutic exercise;Patient/family education;Balance training;Neuromuscular re-education;Gait training;ADLs/Self Care Home Management;Therapeutic activities;Functional mobility training;Manual techniques;Passive range of motion;Scar mobilization    PT Next Visit Plan  dc this visit    PT Home Exercise Plan  Seated and standing LE exercises    Consulted and Agree with Plan of Care  Patient       Patient will benefit from skilled therapeutic intervention in order to improve the following deficits and impairments:  Postural dysfunction, Decreased knowledge of precautions, Pain, Impaired UE functional use, Decreased range of motion, Difficulty walking, Decreased activity tolerance, Decreased strength  Visit Diagnosis: Muscle weakness (generalized)  Stiffness of right shoulder, not elsewhere classified  Difficulty in walking, not elsewhere classified     Problem List Patient Active Problem List   Diagnosis Date Noted  . Insomnia 10/07/2017  . Dehydration 10/07/2017  . Port-A-Cath in place 09/30/2017  . Genetic testing 06/27/2017  . Family history of breast cancer   . Malignant neoplasm of upper-inner quadrant of right breast in female, estrogen receptor positive ([place]) 06/26/2017    Allyson Sabal [facility] 02/03/2018, 1:55 PM  [facility] [place], [place], [address] Phone: [phone]   Fax:  [phone]  Name: Gloria Ware MRN: [id number] Date of Birth: November 16, 1945  PHYSICAL THERAPY DISCHARGE SUMMARY  Visits from Start of Care: 27  Current functional level related to goals / functional outcomes: All goals met   Remaining deficits: None   Education / Equipment: HEP  Plan: Patient agrees to discharge.  Patient goals were met. Patient is being discharged due to meeting the stated rehab  goals.  ?????    Allyson Sabal [place], [place] 02/03/18 1:56 PM

## 2018-02-04 DIAGNOSIS — F411 Generalized anxiety disorder: Secondary | ICD-10-CM | POA: Diagnosis not present

## 2018-02-10 ENCOUNTER — Other Ambulatory Visit: Payer: Self-pay

## 2018-02-10 ENCOUNTER — Ambulatory Visit
Admission: RE | Admit: 2018-02-10 | Discharge: 2018-02-10 | Disposition: A | Discharge status: Home / Self Care | Payer: Medicare Other | Source: Ambulatory Visit | Attending: Radiation Oncology | Admitting: Radiation Oncology

## 2018-02-10 ENCOUNTER — Encounter: Payer: Self-pay | Admitting: Radiation Oncology

## 2018-02-10 VITALS — BP 113/60 | HR 82 | Temp 98.5°F | Resp 18 | Ht <= 58 in | Wt 130.8 lb

## 2018-02-10 DIAGNOSIS — Z79899 Other long term (current) drug therapy: Secondary | ICD-10-CM | POA: Insufficient documentation

## 2018-02-10 DIAGNOSIS — Z7982 Long term (current) use of aspirin: Secondary | ICD-10-CM | POA: Insufficient documentation

## 2018-02-10 DIAGNOSIS — B379 Candidiasis, unspecified: Secondary | ICD-10-CM | POA: Insufficient documentation

## 2018-02-10 DIAGNOSIS — Z170 Estrogen receptor positive status [ER+]: Secondary | ICD-10-CM | POA: Diagnosis not present

## 2018-02-10 DIAGNOSIS — Z923 Personal history of irradiation: Secondary | ICD-10-CM | POA: Insufficient documentation

## 2018-02-10 DIAGNOSIS — C50911 Malignant neoplasm of unspecified site of right female breast: Secondary | ICD-10-CM | POA: Diagnosis not present

## 2018-02-10 DIAGNOSIS — C50211 Malignant neoplasm of upper-inner quadrant of right female breast: Secondary | ICD-10-CM

## 2018-02-10 MED ORDER — NYSTATIN 100000 UNIT/GM EX CREA: TOPICAL_CREAM | CUTANEOUS | 0 refills | Status: DC

## 2018-02-11 DIAGNOSIS — F411 Generalized anxiety disorder: Secondary | ICD-10-CM | POA: Diagnosis not present

## 2018-02-12 NOTE — Progress Notes (Signed)
Radiation Oncology         [phone] ________________________________  Name: KENYATTA GLOECKNER MRN: [id number]  Date of Service: 02/10/2018  DOB: 20-Sep-1946  Post Treatment Note  CC: [name], MD  Nicholas Lose, MD  Diagnosis: Stage IB,pT1bN1M0, grade 2, ER/PR positive invasive lobular carcinoma with pleomorphic changes of the right breast   Interval Since Last Radiation:  4 weeks   11/28/17-01/13/18 1. Right chest wall/ 50.4 Gy in 28 fractions 2. Right chest wall boost/ 10 Gy in 5 fractions   Narrative:  The patient returns today for routine follow-up. During treatment she did very well with radiotherapy and did not have significant desquamation.                             On review of systems, the patient states she's doing well but has had some trouble with soreness in the right chest wall.   ALLERGIES:  is allergic to penicillins; sulfa antibiotics; atorvastatin; and zetia [ezetimibe].  Meds: Current Outpatient Medications  Medication Sig Dispense Refill  . acetaminophen (TYLENOL) 500 MG tablet Take 500 mg by mouth daily as needed for moderate pain or headache.    . anastrozole (ARIMIDEX) 1 MG tablet Take 1 tablet (1 mg total) by mouth daily. 90 tablet 3  . aspirin 81 MG tablet Take 81 mg by mouth daily.      . Calcium Carbonate-Vitamin D (CALTRATE 600+D PO) Take 1 tablet by mouth 2 (two) times daily.    Marland Kitchen docusate sodium (COLACE) 100 MG capsule Take 100 mg by mouth at bedtime.    . feeding supplement (BOOST HIGH PROTEIN) LIQD Take by mouth every morning.    . sertraline (ZOLOFT) 100 MG tablet Take 200 mg by mouth daily.     . diazepam (VALIUM) 5 MG tablet Take 1 tablet (5 mg total) by mouth every 12 (twelve) hours as needed for anxiety or muscle spasms. (Patient not taking: Reported on 02/10/2018) 30 tablet 0  . nystatin cream (MYCOSTATIN) Apply TID until rash resolves. 30 g 0  . vitamin C (ASCORBIC ACID) 500 MG tablet Take 500 mg by mouth daily as needed (immune  support).     No current facility-administered medications for this encounter.     Physical Findings:  height is 5" (0.127 m) (abnormal) and weight is 130 lb 12.8 oz (59.3 kg). Her oral temperature is 98.5 F (36.9 C). Her blood pressure is 113/60 and her pulse is 82. Her respiration is 18 and oxygen saturation is 100%.  Pain Assessment Pain Score: 0-No pain/10 In general this is a well appearing caucasian female in no acute distress. She's alert and oriented x4 and appropriate throughout the examination. Cardiopulmonary assessment is negative for acute distress and she exhibits normal effort. The right breast was examined and reveals a well healed mastectomy incision site. No chest wall or RUE edema is noted. She does have erythematous plaquing along the lower fold of the incision site concerning for cutaneous candida.  Lab Findings: Lab Results  Component Value Date   WBC 4.2 01/14/2018   HGB 12.2 01/14/2018   HCT 36.3 01/14/2018   MCV 91.9 01/14/2018   PLT 161 01/14/2018     Radiographic Findings: No results found.  Impression/Plan: 1. Stage IB,pT1bN1M0, grade 2, ER/PR positive invasive lobular carcinoma with pleomorphic changes of the right breast. The patient has been doing well since completion of radiotherapy. We discussed that we would be happy  to continue to follow her as needed, but she will also continue to follow up with Dr. Lindi Adie in medical oncology. She was counseled on skin care as well as measures to avoid sun exposure to this area.  2. Survivorship. We discussed the importance of survivorship evaluation and she is currently scheduled for this in the near future. She was also given the monthly calendar for access to resources offered within the cancer center. 3. Cutaneous Candida. The patient does have some inframammary yeast on exam. We discussed the use of Nystatin topical cream and this was prescribed for her to use TID until the rash resolves. She will keep [name]  informed if her symptoms do not improve.     Carola Rhine, PAC

## 2018-02-18 DIAGNOSIS — H26491 Other secondary cataract, right eye: Secondary | ICD-10-CM | POA: Diagnosis not present

## 2018-02-19 DIAGNOSIS — F411 Generalized anxiety disorder: Secondary | ICD-10-CM | POA: Diagnosis not present

## 2018-02-25 DIAGNOSIS — F411 Generalized anxiety disorder: Secondary | ICD-10-CM | POA: Diagnosis not present

## 2018-02-28 DIAGNOSIS — J029 Acute pharyngitis, unspecified: Secondary | ICD-10-CM | POA: Diagnosis not present

## 2018-02-28 DIAGNOSIS — H1031 Unspecified acute conjunctivitis, right eye: Secondary | ICD-10-CM | POA: Diagnosis not present

## 2018-02-28 DIAGNOSIS — J020 Streptococcal pharyngitis: Secondary | ICD-10-CM | POA: Diagnosis not present

## 2018-04-09 ENCOUNTER — Telehealth: Payer: Self-pay

## 2018-04-09 NOTE — Telephone Encounter (Signed)
LVM for pt reminding of SCP visit with NP on 04/17/18 at 10 am. Center call back number left on vm for pt with questions.

## 2018-04-14 ENCOUNTER — Encounter: Payer: Self-pay | Admitting: Gynecology

## 2018-04-14 ENCOUNTER — Ambulatory Visit (INDEPENDENT_AMBULATORY_CARE_PROVIDER_SITE_OTHER): Payer: Medicare Other | Admitting: Gynecology

## 2018-04-14 VITALS — BP 122/74 | Ht 60.0 in | Wt 133.0 lb

## 2018-04-14 DIAGNOSIS — Z853 Personal history of malignant neoplasm of breast: Secondary | ICD-10-CM | POA: Diagnosis not present

## 2018-04-14 DIAGNOSIS — Z01411 Encounter for gynecological examination (general) (routine) with abnormal findings: Secondary | ICD-10-CM | POA: Diagnosis not present

## 2018-04-14 DIAGNOSIS — Z9189 Other specified personal risk factors, not elsewhere classified: Secondary | ICD-10-CM | POA: Diagnosis not present

## 2018-04-14 DIAGNOSIS — M8580 Other specified disorders of bone density and structure, unspecified site: Secondary | ICD-10-CM

## 2018-04-14 DIAGNOSIS — N952 Postmenopausal atrophic vaginitis: Secondary | ICD-10-CM

## 2018-04-14 NOTE — Patient Instructions (Signed)
Follow-up in 1 year for annual gynecologic exam.  Sooner if any issues.

## 2018-04-14 NOTE — Progress Notes (Signed)
    Gloria Ware 08-20-1946 [id number]        72 y.o.  G2P2 for breast and pelvic exam.  Underwent right mastectomy this past year with follow-up radiation.  Had reaction to her chemotherapy regiment and ultimately ended up stopping this prematurely.  Now on anastrozole.  Past medical history,surgical history, problem list, medications, allergies, family history and social history were all reviewed and documented as reviewed in the EPIC chart.  ROS:  Performed with pertinent positives and negatives included in the history, assessment and plan.   Additional significant findings : None   Exam: Caryn Bee assistant Vitals:   04/14/18 1357  BP: 122/74  Weight: 133 lb (60.3 kg)  Height: 5' (1.524 m)   Body mass index is 25.97 kg/m.  General appearance:  Normal affect, orientation and appearance. Skin: Grossly normal HEENT: Without gross lesions.  No cervical or supraclavicular adenopathy. Thyroid normal.  Lungs:  Clear without wheezing, rales or rhonchi Cardiac: RR, without RMG Abdominal:  Soft, nontender, without masses, guarding, rebound, organomegaly or hernia Breasts:  Examined lying and sitting.  Left without masses, retractions, discharge or axillary adenopathy.  Right status post mastectomy.  No chest wall masses or axillary adenopathy. Pelvic:  Ext, BUS, Vagina: With atrophic changes  Cervix: With atrophic changes  Uterus: Anteverted, normal size, shape and contour, midline and mobile nontender   Adnexa: Without masses or tenderness    Anus and perineum: Normal   Rectovaginal: Normal sphincter tone without palpated masses or tenderness.    Assessment/Plan:  72 y.o. G2P2 female for some pelvic exam.  1. Postmenopausal/atrophic changes.  No significant menopausal symptoms or any vaginal bleeding. 2. Breast cancer status post right mastectomy, chemotherapy which was stopped early due to reaction, radiation and now anastrozole.  Actively being followed by radiation  oncology/medical oncology.  Mammogram per their recommendation interval.  She does relate having been genetically tested and negative. 3. Osteopenia.  DEXA 2018 T score -2.1.  Overall stable from her prior DEXA.  We reviewed accelerated bone loss associated with anastrozole.  We will plan follow-up DEXA next year at 2-year interval which will be 1 year after starting anastrozole. 4. Pap smear 09/2016.  No Pap smear done today.  No history of significant abnormal Pap smears.  We again reviewed options to stop screening altogether or less frequent screening intervals. 5. Colonoscopy reported 2012.  Repeat at their recommended interval. 6. Health maintenance.  No routine lab work done as patient does this elsewhere.  Follow-up 1 year, sooner as needed.   Anastasio Auerbach MD, 3:10 PM 04/14/2018

## 2018-04-17 ENCOUNTER — Encounter: Payer: Self-pay | Admitting: Adult Health

## 2018-04-17 ENCOUNTER — Inpatient Hospital Stay: Payer: Medicare Other | Attending: Hematology and Oncology | Admitting: Adult Health

## 2018-04-17 VITALS — BP 115/68 | HR 88 | Temp 98.3°F | Resp 18 | Ht 60.0 in | Wt 132.4 lb

## 2018-04-17 DIAGNOSIS — Z8041 Family history of malignant neoplasm of ovary: Secondary | ICD-10-CM

## 2018-04-17 DIAGNOSIS — Z170 Estrogen receptor positive status [ER+]: Secondary | ICD-10-CM | POA: Diagnosis not present

## 2018-04-17 DIAGNOSIS — Z923 Personal history of irradiation: Secondary | ICD-10-CM | POA: Diagnosis not present

## 2018-04-17 DIAGNOSIS — Z803 Family history of malignant neoplasm of breast: Secondary | ICD-10-CM | POA: Insufficient documentation

## 2018-04-17 DIAGNOSIS — Z79899 Other long term (current) drug therapy: Secondary | ICD-10-CM | POA: Diagnosis not present

## 2018-04-17 DIAGNOSIS — Z7982 Long term (current) use of aspirin: Secondary | ICD-10-CM | POA: Diagnosis not present

## 2018-04-17 DIAGNOSIS — F418 Other specified anxiety disorders: Secondary | ICD-10-CM

## 2018-04-17 DIAGNOSIS — F429 Obsessive-compulsive disorder, unspecified: Secondary | ICD-10-CM | POA: Insufficient documentation

## 2018-04-17 DIAGNOSIS — F329 Major depressive disorder, single episode, unspecified: Secondary | ICD-10-CM | POA: Diagnosis not present

## 2018-04-17 DIAGNOSIS — M8580 Other specified disorders of bone density and structure, unspecified site: Secondary | ICD-10-CM | POA: Insufficient documentation

## 2018-04-17 DIAGNOSIS — C50211 Malignant neoplasm of upper-inner quadrant of right female breast: Secondary | ICD-10-CM | POA: Diagnosis not present

## 2018-04-17 DIAGNOSIS — Z79811 Long term (current) use of aromatase inhibitors: Secondary | ICD-10-CM | POA: Diagnosis not present

## 2018-04-17 DIAGNOSIS — Z86010 Personal history of colonic polyps: Secondary | ICD-10-CM | POA: Insufficient documentation

## 2018-04-17 DIAGNOSIS — E7800 Pure hypercholesterolemia, unspecified: Secondary | ICD-10-CM | POA: Insufficient documentation

## 2018-04-17 DIAGNOSIS — Z9011 Acquired absence of right breast and nipple: Secondary | ICD-10-CM | POA: Diagnosis not present

## 2018-04-17 DIAGNOSIS — Z1239 Encounter for other screening for malignant neoplasm of breast: Secondary | ICD-10-CM

## 2018-04-17 NOTE — Progress Notes (Signed)
CLINIC:  Survivorship   REASON FOR VISIT:  Routine follow-up post-treatment for a recent history of breast cancer.  BRIEF ONCOLOGIC HISTORY:    Malignant neoplasm of upper-inner quadrant of right breast in female, estrogen receptor positive ([place])   06/21/2017 Initial Diagnosis    Screening detected right breast mass and calcifications 1 cm at 1:00 position biopsy of mass and calcifications revealed invasive lobular cancer with pleomorphic features, grade 2, axillary lymph node biopsy positive for cancer, ER/PR positive HER-2 negative with a Ki-67 of 40%, T1b N1 stage IB AJCC 8      06/27/2017 Genetic Testing    Genetic testing was negative and did not reveal any mutations.  Genes tested include: ALK, APC, ATM, AXIN2, BAP1, BARD1, BLM, BMPR1A, BRCA1, BRCA2, BRIP1, CASR, CDC73, CDH1, CDK4, CDKN1B, CDKN1C, CDKN2A, CEBPA, CHEK2, CTNNA1, DICER1, DIS3L2, EGFR, EPCAM, FH, FLCN, GATA2, GPC3, GREM1, HOXB13, HRAS, KIT, MAX, MEN1, MET, MITF, MLH1, MSH2, MSH3, MSH6, MUTYH, NBN, NF1, NF2, NTHL1, PALB2, PDGFRA, PHOX2B, PMS2, POLD1, POLE, POT1, PRKAR1A, PTCH1, PTEN, RAD50, RAD51C, RAD51D, RB1, RECQL4, RET, RUNX1, SDHA, SDHAF2, SDHB, SDHC, SDHD, SMAD4, SMARCA4, SMARCB1, SMARCE1, STK11, SUFU, TERC, TERT, TMEM127, TP53, TSC1, TSC2, VHL, WRN, WT1.      08/06/2017 Surgery    Right mastectomy: Scattered foci of invasive lobular cancer, grade 2, 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, LCIS, margins negative, 2/6 lymph nodes +1 with isolated tumor cells, T1BN1 stage I the AJCC 8 ER 95%, PR 30%, HER-2 negative, Ki-67 40%      08/12/2017 Miscellaneous    Mammaprint high risk luminal type B      09/17/2017 -  Chemotherapy    Adjuvant chemo with dose dense Adriamycin and Cytoxan X 2 (Stopped due to toxicities) followed by Taxol weekly x1 stopped due to toxicities       10/07/2017 - 10/09/2017 Hospital Admission    Adm for failure to thrive, insomnia and back pain      11/28/2017 - 01/13/2018 Radiation Therapy    Adj XRT      02/2018 -  Anti-estrogen oral therapy    Anastrozole daily       INTERVAL HISTORY:  Gloria Ware presents to the Survivorship Clinic today for our initial meeting to review her survivorship care plan detailing her treatment course for breast cancer, as well as monitoring long-term side effects of that treatment, education regarding health maintenance, screening, and overall wellness and health promotion.     Overall, Gloria Ware reports feeling moderately well.  She is accompanied by her husband.  She is taking Anastrozole daily and tolerates it well.  She does have some joint stiffness.  This doesn't sound like its worse than the joint stiffness she experiencied prior to her breast cancer diagnosis.    Gloria Ware also conveys that she has issues with anxiety and stress.  She identifies herself as a empath.  She notes that she is taking Zoloft daily and has diazepam q12hprn as well.  She has seen a counselor in the past who has moved.  She has names of other counselors but has not yet re established with a therapist.  She has also seen psychiatry, but doesn't want changes in her meds at this point.      REVIEW OF SYSTEMS:  Review of Systems  Constitutional: Negative for appetite change, chills, fatigue, fever and unexpected weight change.  HENT:   Negative for hearing loss, lump/mass, sore throat, trouble swallowing and voice change.   Eyes: Negative for eye problems and icterus.  Respiratory:  Negative for chest tightness, cough and shortness of breath.   Cardiovascular: Negative for chest pain, leg swelling and palpitations.  Gastrointestinal: Negative for abdominal distention, abdominal pain, constipation, diarrhea, nausea and vomiting.  Endocrine: Negative for hot flashes.  Musculoskeletal: Positive for arthralgias.  Skin: Negative for itching and rash.  Neurological: Negative for dizziness, extremity weakness, headaches and numbness.  Hematological: Negative for adenopathy. Does not bruise/bleed  easily.  Psychiatric/Behavioral: Negative for depression. The patient is nervous/anxious.    Breast: Denies any new nodularity, masses, tenderness, nipple changes, or nipple discharge.      ONCOLOGY TREATMENT TEAM:  1. Surgeon:  Dr. Barry Dienes at [facility] 2. Medical Oncologist: Dr. Lindi Adie  3. Radiation Oncologist: Dr. Lisbeth Renshaw    PAST MEDICAL/SURGICAL HISTORY:  Past Medical History:  Diagnosis Date  . Anxiety   . Breast CA ([place])   . Colon polyp 09/12/2005  . Depression   . Eczema    right forearm   . Genetic testing 06/27/2017   Multi-Cancer panel (83 genes) @ [facility] - No pathogenic mutations detected  . Hearing aid worn    B/L  . Hearing loss   . High cholesterol   . Injury     left foot metatarsal fracture sustined 08-03-17 , treated with boot; patient  reports  improvement as of today  . OCD (obsessive compulsive disorder)   . Osteopenia 11/2016   T score -2.1 stable from prior DEXA  . PONV (postoperative nausea and vomiting)    post op nausea; took a long time waking up  . Tinnitus    Past Surgical History:  Procedure Laterality Date  . BREAST SURGERY     Mastectomy-Right  . CARDIAC CATHETERIZATION     husband was having symtpoms and needed a stent; subsequently she states she started " to feel something"  but thinks it was " all in my head bc of my hbsand". subsequently saw same cardiologist as her husband who sent for treadmill stress that had inconcluisicve results so she was sent for cardiac catherization which she states was " definitely negative "   . cataract surg    . COLONOSCOPY     with polypectomy  . DILATION AND CURETTAGE OF UTERUS    . IR CV LINE INJECTION  09/30/2017  . MASTECTOMY W/ SENTINEL NODE BIOPSY Right 08/06/2017   Procedure: RIGHT MASTECTOMY WITH SENTINEL LYMPH NODE BIOPSY;  Surgeon: [name], MD;  Location: [facility];  Service: General;  Laterality: Right;  . MOUTH SURGERY    . PORT-A-CATH REMOVAL Left  01/20/2018   Procedure: REMOVAL PORT-A-CATH;  Surgeon: [name], MD;  Location: [facility];  Service: General;  Laterality: Left;  . PORTACATH PLACEMENT N/A 09/12/2017   Procedure: INSERTION PORT-A-CATH;  Surgeon: [name], MD;  Location: [facility];  Service: General;  Laterality: N/A;  . RADIOACTIVE SEED GUIDED AXILLARY SENTINEL LYMPH NODE Right 08/06/2017   Procedure: SEED TARGETED AXILLARY LYMPH NODE EXCISION;  Surgeon: [name], MD;  Location: [facility];  Service: General;  Laterality: Right;     ALLERGIES:  Allergies  Allergen Reactions  . Penicillins Other (See Comments)    Throat burning Has patient had a PCN reaction causing immediate rash, facial/tongue/throat swelling, SOB or lightheadedness with hypotension: No Has patient had a PCN reaction causing severe rash involving mucus membranes or skin necrosis: No Has patient had a PCN reaction that required hospitalization: No Has patient had a PCN reaction occurring within the last 10 years: No  If all of the above answers are "NO", then may proceed with Cephalosporin use.   . Sulfa Antibiotics Nausea And Vomiting  . Atorvastatin      Muscle aches   . Zetia [Ezetimibe] Other (See Comments)    Muscle aches      CURRENT MEDICATIONS:  Outpatient Encounter Medications as of 04/17/2018  Medication Sig  . acetaminophen (TYLENOL) 500 MG tablet Take 500 mg by mouth daily as needed for moderate pain or headache.  . anastrozole (ARIMIDEX) 1 MG tablet Take 1 tablet (1 mg total) by mouth daily.  Marland Kitchen aspirin 81 MG tablet Take 81 mg by mouth daily.    . Calcium Carbonate-Vitamin D (CALTRATE 600+D PO) Take 1 tablet by mouth 2 (two) times daily.  . diazepam (VALIUM) 5 MG tablet Take 1 tablet (5 mg total) by mouth every 12 (twelve) hours as needed for anxiety or muscle spasms.  Marland Kitchen docusate sodium (COLACE) 100 MG capsule Take 100 mg by mouth at bedtime.  . feeding supplement (BOOST HIGH PROTEIN) LIQD Take by mouth every  morning.  . nystatin cream (MYCOSTATIN) Apply TID until rash resolves.  . sertraline (ZOLOFT) 100 MG tablet Take 200 mg by mouth daily.   . vitamin C (ASCORBIC ACID) 500 MG tablet Take 500 mg by mouth daily as needed (immune support).   No facility-administered encounter medications on file as of 04/17/2018.      ONCOLOGIC FAMILY HISTORY:  Family History  Problem Relation Age of Onset  . Breast cancer Sister 28       currently 34  . Hypertension Maternal Grandmother   . Stroke Maternal Grandmother   . Hypertension Maternal Grandfather   . Stroke Maternal Grandfather   . Breast cancer Cousin        unconfirmed in maternal first cousin  . Ovarian cancer Cousin        unconfirmed if ovarian vs. uterine in paternal first cousin     GENETIC COUNSELING/TESTING: negative  SOCIAL HISTORY:  Social History   Socioeconomic History  . Marital status: Married    Spouse name: Not on file  . Number of children: Not on file  . Years of education: Not on file  . Highest education level: Not on file  Occupational History  . Occupation: retired    Comment: musician  Social Needs  . Financial resource strain: Not on file  . Food insecurity:    Worry: Not on file    Inability: Not on file  . Transportation needs:    Medical: Not on file    Non-medical: Not on file  Tobacco Use  . Smoking status: Never Smoker  . Smokeless tobacco: Never Used  Substance and Sexual Activity  . Alcohol use: Not on file    Comment: Rare wine  . Drug use: No  . Sexual activity: Not Currently    Partners: Male    Birth control/protection: Post-menopausal    Comment: 1st intercourse 83 yo-1 partner  Lifestyle  . Physical activity:    Days per week: Not on file    Minutes per session: Not on file  . Stress: Not on file  Relationships  . Social connections:    Talks on phone: Not on file    Gets together: Not on file    Attends religious service: Not on file    Active member of club or  organization: Not on file    Attends meetings of clubs or organizations: Not on file    Relationship status:  Not on file  . Intimate partner violence:    Fear of current or ex partner: Not on file    Emotionally abused: Not on file    Physically abused: Not on file    Forced sexual activity: Not on file  Other Topics Concern  . Not on file  Social History Narrative  . Not on file      PHYSICAL EXAMINATION:  Vital Signs:   Vitals:   04/17/18 1002  BP: 115/68  Pulse: 88  Resp: 18  Temp: 98.3 F (36.8 C)  SpO2: 98%   Filed Weights   04/17/18 1002  Weight: 132 lb 6.4 oz (60.1 kg)   General: Well-nourished, well-appearing female in no acute distress.  She is accompanied in clinic by her husband today.   HEENT: Head is normocephalic.  Pupils equal and reactive to light. Conjunctivae clear without exudate.  Sclerae anicteric. Oral mucosa is pink, moist.  Oropharynx is pink without lesions or erythema.  Lymph: No cervical, supraclavicular, or infraclavicular lymphadenopathy noted on palpation.  Cardiovascular: Regular rate and rhythm.Marland Kitchen Respiratory: Clear to auscultation bilaterally. Chest expansion symmetric; breathing non-labored.  Breasts: right breast is surgically absent, radiation hyperpigmentation and changes noted, no nodules, masses, or sign of recurrence present, left breast without nodules, masses skin or nipple changes. GI: Abdomen soft and round; non-tender, non-distended. Bowel sounds normoactive.  GU: Deferred.  Neuro: No focal deficits. Steady gait.  Psych: Mood and affect normal and appropriate for situation.  Extremities: No edema. MSK: No focal spinal tenderness to palpation.  Full range of motion in bilateral upper extremities Skin: Warm and dry.  LABORATORY DATA:  None for this visit.  DIAGNOSTIC IMAGING:  None for this visit.      ASSESSMENT AND PLAN:  Ms.. Piltz is a pleasant 72 y.o. female with Stage IA right breast invasive ductal carcinoma,  ER+/PR+/HER2-, diagnosed in 06/2017, treated with mastectomy, adjuvant chemotherapy, adjuvant radiation therapy, and anti-estrogen therapy with Anastrozole beginning in 02/2018.  She presents to the Survivorship Clinic for our initial meeting and routine follow-up post-completion of treatment for breast cancer.    1. Stage IA right breast cancer:  Ms. Desir is continuing to recover from definitive treatment for breast cancer. She will follow-up with her medical oncologist, Dr. Lindi Adie in 6 months with history and physical exam per surveillance protocol.  She will continue her anti-estrogen therapy with Anastrozole. Thus far, she is tolerating the Anastrozole well, with minimal side effects. Today, a comprehensive survivorship care plan and treatment summary was reviewed with the patient today detailing her breast cancer diagnosis, treatment course, potential late/long-term effects of treatment, appropriate follow-up care with recommendations for the future, and patient education resources.  A copy of this summary, along with a letter will be sent to the patient's primary care provider via mail/fax/In Basket message after today's visit.    2. Anxiety/fear of recurrence:  I spent a great deal of time today with Hedwig discussing recurrence risks and her stress.  She has not yet re established with a therapist, and I think doing cognitive behavioral therapy would help her.  I also think that she would benefit from our finding your new normal class.  I gave her extensive details about this today.  It is starting back in September.    3. Bone health:  Given Ms. Perkovich's age/history of breast cancer and her current treatment regimen including anti-estrogen therapy with Anastrozole, she is at risk for bone demineralization.  Her last DEXA scan was 11/29/2016  and was consistent with osteopenia with a T score of -2.1 in the left femoral neck.  In the meantime, she was encouraged to increase her consumption of foods rich in  calcium, as well as increase her weight-bearing activities.  She was given education on specific activities to promote bone health.  4. Cancer screening:  Due to Ms. Trumbo's history and her age, she should receive screening for skin cancers, colon cancer, and gynecologic cancers.  The information and recommendations are listed on the patient's comprehensive care plan/treatment summary and were reviewed in detail with the patient.    5. Health maintenance and wellness promotion: Ms. Stankovic was encouraged to consume 5-7 servings of fruits and vegetables per day. We reviewed the "Nutrition Rainbow" handout, as well as the handout "Take Control of Your Health and Reduce Your Cancer Risk" from the [place].  She was also encouraged to engage in moderate to vigorous exercise for 30 minutes per day most days of the week. We discussed the [employer] fitness program, which is designed for cancer survivors to help them become more physically fit after cancer treatments.  She was instructed to limit her alcohol consumption and continue to abstain from tobacco use.     6. Support services/counseling: It is not uncommon for this period of the patient's cancer care trajectory to be one of many emotions and stressors.  We discussed an opportunity for her to participate in the next session of [facility] ("Finding Your New Normal") support group series designed for patients after they have completed treatment.   Ms. Vanvorst was encouraged to take advantage of our many other support services programs, support groups, and/or counseling in coping with her new life as a cancer survivor after completing anti-cancer treatment.  She was offered support today through active listening and expressive supportive counseling.  She was given information regarding our available services and encouraged to contact me with any questions or for help enrolling in any of our support group/programs.    Dispo:   -Return to cancer  center for f/u with Dr. Lindi Adie in 6 months -Mammogram due in 05/2018 -Follow up with surgery in three months -Bone density 11/2018 with Dr. Phineas Real -She is welcome to return back to the Survivorship Clinic at any time; no additional follow-up needed at this time.  -Consider referral back to survivorship as a long-term survivor for continued surveillance  A total of (50) minutes of face-to-face time was spent with this patient with greater than 50% of that time in counseling and care-coordination.   Gardenia Phlegm, NP Survivorship Program [place] [phone]   Note: PRIMARY CARE PROVIDER [name], [place] [phone]

## 2018-04-18 ENCOUNTER — Telehealth: Payer: Self-pay

## 2018-04-18 ENCOUNTER — Telehealth: Payer: Self-pay | Admitting: Hematology and Oncology

## 2018-04-18 NOTE — Telephone Encounter (Signed)
Message sent to scheduling to set up appt with Dr. Lindi Adie in 3 months and call patient with time/date.

## 2018-04-18 NOTE — Telephone Encounter (Signed)
Spoke to patient regarding upcoming October appts per 7/12 sch message

## 2018-04-18 NOTE — Telephone Encounter (Signed)
-----   Message from Gardenia Phlegm, NP sent at 04/17/2018  5:03 PM EDT ----- Please put in schedule message to schedule patient accordingly.  Please call her and let her know.    ----- Message ----- From: Nicholas Lose, MD Sent: 04/17/2018   4:26 PM To: [name], MD, Gardenia Phlegm, NP  I can see her in 3 months and talk about NATALEE VG ----- Message ----- From: Gardenia Phlegm, NP Sent: 04/17/2018   1:01 PM To: [name], MD, Nicholas Lose, MD  I saw patient today in survivorship.  She is VERY anxious.  Dr. Lindi Adie, typically you would see her in 6 months, however, wanted to know if you wanted to see her sooner for consideration of NATALEE?  Dr. Barry Dienes, Tomi Bamberger noted that you wanted her to be seen every 3 months by yourself and Dr. Lindi Adie (which is probably a good idea initially considering her high anxiety).  Are you ok with me sending a message to you about when that should be considering Dr. Geralyn Flash answer to the above question?    Thanks so much!  Mendel Ryder

## 2018-04-24 ENCOUNTER — Telehealth: Payer: Self-pay | Admitting: Hematology and Oncology

## 2018-04-24 NOTE — Telephone Encounter (Signed)
Pt called in and had scheduling questions about letter sent in the mail . - called pt back twice and left messages both times.

## 2018-06-25 ENCOUNTER — Ambulatory Visit
Admission: RE | Admit: 2018-06-25 | Discharge: 2018-06-25 | Disposition: A | Discharge status: Home / Self Care | Payer: Medicare Other | Source: Ambulatory Visit | Attending: Adult Health | Admitting: Adult Health

## 2018-06-25 DIAGNOSIS — C50211 Malignant neoplasm of upper-inner quadrant of right female breast: Secondary | ICD-10-CM

## 2018-06-25 DIAGNOSIS — Z1231 Encounter for screening mammogram for malignant neoplasm of breast: Secondary | ICD-10-CM | POA: Diagnosis not present

## 2018-06-25 DIAGNOSIS — Z1239 Encounter for other screening for malignant neoplasm of breast: Secondary | ICD-10-CM

## 2018-06-25 DIAGNOSIS — Z170 Estrogen receptor positive status [ER+]: Principal | ICD-10-CM

## 2018-06-25 HISTORY — DX: Personal history of antineoplastic chemotherapy: Z92.21

## 2018-06-25 HISTORY — DX: Personal history of irradiation: Z92.3

## 2018-07-17 ENCOUNTER — Telehealth: Payer: Self-pay | Admitting: Hematology and Oncology

## 2018-07-17 ENCOUNTER — Inpatient Hospital Stay: Payer: Medicare Other | Attending: Hematology and Oncology | Admitting: Hematology and Oncology

## 2018-07-17 VITALS — BP 122/62 | HR 92 | Temp 97.6°F | Resp 16 | Ht 60.0 in | Wt 134.2 lb

## 2018-07-17 DIAGNOSIS — Z23 Encounter for immunization: Secondary | ICD-10-CM | POA: Insufficient documentation

## 2018-07-17 DIAGNOSIS — Z7982 Long term (current) use of aspirin: Secondary | ICD-10-CM | POA: Insufficient documentation

## 2018-07-17 DIAGNOSIS — Z79811 Long term (current) use of aromatase inhibitors: Secondary | ICD-10-CM | POA: Diagnosis not present

## 2018-07-17 DIAGNOSIS — Z170 Estrogen receptor positive status [ER+]: Secondary | ICD-10-CM | POA: Diagnosis not present

## 2018-07-17 DIAGNOSIS — Z79899 Other long term (current) drug therapy: Secondary | ICD-10-CM | POA: Insufficient documentation

## 2018-07-17 DIAGNOSIS — R748 Abnormal levels of other serum enzymes: Secondary | ICD-10-CM | POA: Insufficient documentation

## 2018-07-17 DIAGNOSIS — C773 Secondary and unspecified malignant neoplasm of axilla and upper limb lymph nodes: Secondary | ICD-10-CM | POA: Diagnosis not present

## 2018-07-17 DIAGNOSIS — Z923 Personal history of irradiation: Secondary | ICD-10-CM | POA: Insufficient documentation

## 2018-07-17 DIAGNOSIS — C50211 Malignant neoplasm of upper-inner quadrant of right female breast: Secondary | ICD-10-CM | POA: Insufficient documentation

## 2018-07-17 DIAGNOSIS — R251 Tremor, unspecified: Secondary | ICD-10-CM | POA: Diagnosis not present

## 2018-07-17 DIAGNOSIS — R531 Weakness: Secondary | ICD-10-CM | POA: Diagnosis not present

## 2018-07-17 DIAGNOSIS — Z9221 Personal history of antineoplastic chemotherapy: Secondary | ICD-10-CM | POA: Diagnosis not present

## 2018-07-17 DIAGNOSIS — Z9011 Acquired absence of right breast and nipple: Secondary | ICD-10-CM | POA: Diagnosis not present

## 2018-07-17 MED ORDER — INFLUENZA VAC SPLIT HIGH-DOSE 0.5 ML IM SUSY
0.5000 mL | PREFILLED_SYRINGE | Freq: Once | INTRAMUSCULAR | Status: AC
  Administered 2018-07-17: 0.5 mL via INTRAMUSCULAR
  Filled 2018-07-17: qty 0.5

## 2018-07-17 NOTE — Telephone Encounter (Signed)
Per 07/17/18 los, moved Jan appt to April.  Gave patient avs and calendar.

## 2018-07-17 NOTE — Progress Notes (Signed)
Patient Care Team: [name], MD as PCP - General (Family Medicine) [name], MD as Consulting Physician (General Surgery) Nicholas Lose, MD as Consulting Physician (Hematology and Oncology) Kyung Rudd, MD as Consulting Physician (Radiation Oncology) Delice Bison Charlestine Massed, NP as Nurse Practitioner (Hematology and Oncology)  DIAGNOSIS:  Encounter Diagnoses  Name Primary?  . Need for prophylactic vaccination and inoculation against influenza Yes  . Malignant neoplasm of upper-inner quadrant of right breast in female, estrogen receptor positive ([place])     SUMMARY OF ONCOLOGIC HISTORY:   Malignant neoplasm of upper-inner quadrant of right breast in female, estrogen receptor positive ([place])   06/21/2017 Initial Diagnosis    Screening detected right breast mass and calcifications 1 cm at 1:00 position biopsy of mass and calcifications revealed invasive lobular cancer with pleomorphic features, grade 2, axillary lymph node biopsy positive for cancer, ER/PR positive HER-2 negative with a Ki-67 of 40%, T1b N1 stage IB AJCC 8    06/27/2017 Genetic Testing    Genetic testing was negative and did not reveal any mutations.  Genes tested include: ALK, APC, ATM, AXIN2, BAP1, BARD1, BLM, BMPR1A, BRCA1, BRCA2, BRIP1, CASR, CDC73, CDH1, CDK4, CDKN1B, CDKN1C, CDKN2A, CEBPA, CHEK2, CTNNA1, DICER1, DIS3L2, EGFR, EPCAM, FH, FLCN, GATA2, GPC3, GREM1, HOXB13, HRAS, KIT, MAX, MEN1, MET, MITF, MLH1, MSH2, MSH3, MSH6, MUTYH, NBN, NF1, NF2, NTHL1, PALB2, PDGFRA, PHOX2B, PMS2, POLD1, POLE, POT1, PRKAR1A, PTCH1, PTEN, RAD50, RAD51C, RAD51D, RB1, RECQL4, RET, RUNX1, SDHA, SDHAF2, SDHB, SDHC, SDHD, SMAD4, SMARCA4, SMARCB1, SMARCE1, STK11, SUFU, TERC, TERT, TMEM127, TP53, TSC1, TSC2, VHL, WRN, WT1.    08/06/2017 Surgery    Right mastectomy: Scattered foci of invasive lobular cancer, grade 2, 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, LCIS, margins negative, 2/6 lymph nodes +1 with isolated tumor cells, T1BN1 stage I the AJCC 8 ER  95%, PR 30%, HER-2 negative, Ki-67 40%    08/12/2017 Miscellaneous    Mammaprint high risk luminal type B    09/17/2017 -  Chemotherapy    Adjuvant chemo with dose dense Adriamycin and Cytoxan X 2 (Stopped due to toxicities) followed by Taxol weekly x1 stopped due to toxicities     10/07/2017 - 10/09/2017 Hospital Admission    Adm for failure to thrive, insomnia and back pain    11/28/2017 - 01/13/2018 Radiation Therapy    Adj XRT    02/2018 -  Anti-estrogen oral therapy    Anastrozole daily     CHIEF COMPLIANT: Follow-up on anastrozole therapy  INTERVAL HISTORY: Gloria Ware is a 72 year old with above-mentioned history of right breast cancer treated with mastectomy followed by adjuvant chemotherapy and radiation.  She is currently on oral antiestrogen therapy with anastrozole.  She has shown a dramatic and remarkable recovery from where she used to be previously.  She was even unable to walk a couple of steps but now she is able to drive by herself and walk to the appointment.  She still feels generally weak and also has tremors.  She is tolerating anastrozole fairly well without any hot flashes or myalgias.  REVIEW OF SYSTEMS:   Constitutional: Denies fevers, chills or abnormal weight loss Eyes: Denies blurriness of vision Ears, nose, mouth, throat, and face: Denies mucositis or sore throat Respiratory: Denies cough, dyspnea or wheezes Cardiovascular: Denies palpitation, chest discomfort Gastrointestinal:  Denies nausea, heartburn or change in bowel habits Skin: Denies abnormal skin rashes Lymphatics: Denies new lymphadenopathy or easy bruising Neurological: Tremors and generalized weakness Behavioral/Psych: Mood is stable, no new changes  Extremities: No lower  extremity edema Breast:  denies any pain or lumps or nodules in either breasts All other systems were reviewed with the patient and are negative.  I have reviewed the past medical history, past surgical history, social  history and family history with the patient and they are unchanged from previous note.  ALLERGIES:  is allergic to penicillins; sulfa antibiotics; atorvastatin; and zetia [ezetimibe].  MEDICATIONS:  Current Outpatient Medications  Medication Sig Dispense Refill  . acetaminophen (TYLENOL) 500 MG tablet Take 500 mg by mouth daily as needed for moderate pain or headache.    . anastrozole (ARIMIDEX) 1 MG tablet Take 1 tablet (1 mg total) by mouth daily. 90 tablet 3  . aspirin 81 MG tablet Take 81 mg by mouth daily.      . Calcium Carbonate-Vitamin D (CALTRATE 600+D PO) Take 1 tablet by mouth 2 (two) times daily.    . diazepam (VALIUM) 5 MG tablet Take 1 tablet (5 mg total) by mouth every 12 (twelve) hours as needed for anxiety or muscle spasms. 30 tablet 0  . docusate sodium (COLACE) 100 MG capsule Take 100 mg by mouth at bedtime.    . feeding supplement (BOOST HIGH PROTEIN) LIQD Take by mouth every morning.    . nystatin cream (MYCOSTATIN) Apply TID until rash resolves. 30 g 0  . sertraline (ZOLOFT) 100 MG tablet Take 200 mg by mouth daily.     . vitamin C (ASCORBIC ACID) 500 MG tablet Take 500 mg by mouth daily as needed (immune support).     No current facility-administered medications for this visit.     PHYSICAL EXAMINATION: ECOG PERFORMANCE STATUS: 1 - Symptomatic but completely ambulatory  Vitals:   07/17/18 0952  BP: 122/62  Pulse: 92  Resp: 16  Temp: 97.6 F (36.4 C)  SpO2: 100%   Filed Weights   07/17/18 0952  Weight: 134 lb 3.2 oz (60.9 kg)    GENERAL:alert, no distress and comfortable SKIN: skin color, texture, turgor are normal, no rashes or significant lesions EYES: normal, Conjunctiva are pink and non-injected, sclera clear OROPHARYNX:no exudate, no erythema and lips, buccal mucosa, and tongue normal  NECK: supple, thyroid normal size, non-tender, without nodularity LYMPH:  no palpable lymphadenopathy in the cervical, axillary or inguinal LUNGS: clear to  auscultation and percussion with normal breathing effort HEART: regular rate & rhythm and no murmurs and no lower extremity edema ABDOMEN:abdomen soft, non-tender and normal bowel sounds MUSCULOSKELETAL:no cyanosis of digits and no clubbing  NEURO: alert & oriented x 3 with fluent speech, no focal motor/sensory deficits EXTREMITIES: No lower extremity edema BREAST: Right mastectomy scar is palpated and there is radiation changes but no lumps or nodules.  Left breast is without lumps or nodules.  (exam performed in the presence of a chaperone)  LABORATORY DATA:  I have reviewed the data as listed CMP Latest Ref Rng & Units 01/14/2018 11/05/2017 10/28/2017  Glucose 70 - 140 mg/dL 106 104(H) 100  BUN 7 - 26 mg/dL _0 Creatinine 0.60 - 1.10 mg/dL 0.86 0.70 0.93  Sodium 136 - 145 mmol/L 138 136 135(L)  Potassium 3.5 - 5.1 mmol/L 4.2 3.8 3.8  Chloride 98 - 109 mmol/L 103 100(L) 102  CO2 22 - 29 mmol/L 27 - 23  Calcium 8.4 - 10.4 mg/dL 9.9 - 9.2  Total Protein 6.4 - 8.3 g/dL 7.3 - 6.8  Total Bilirubin 0.2 - 1.2 mg/dL 0.2 - 0.3  Alkaline Phos 40 - 150 U/L 181(H) - 75  AST 5 - 34 U/L 30 - 26  ALT 0 - 55 U/L 65(H) - 24    Lab Results  Component Value Date   WBC 4.2 01/14/2018   HGB 12.2 01/14/2018   HCT 36.3 01/14/2018   MCV 91.9 01/14/2018   PLT 161 01/14/2018   NEUTROABS 2.7 01/14/2018    ASSESSMENT & PLAN:  Malignant neoplasm of upper-inner quadrant of right breast in female, estrogen receptor positive ([place]) 08/06/2017 right mastectomy: Scattered foci of invasive lobular cancer, grade 2, 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 Mammaprint high risk: Dose since Adriamycin and Cytoxan x2 followed by Taxol weekly x1 09/23/2017-10/21/17 stopped for toxicities 2. adjuvant radiation therapy 11/28/17- 01/13/18 3. Followed by adjuvant antiestrogen  therapy -------------------------------------------------------------------------------------------------------------------------------------- Plan: Anti estrogen therapy with anastrozole 1 mg daily starting 02/05/18  Anastrozole toxicities:  Denies any hot flashes or arthralgias or myalgias.  Tremors Generalized muscle weakness  Patient is able to drive by herself and walk to the appointment.  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  Prior blood work showing elevation of alkaline phosphatase and AST: I will request a copy of her blood work from her primary care physician.  Return to clinic in 6 months for follow-up.  No orders of the defined types were placed in this encounter.  The patient has a good understanding of the overall plan. she agrees with it. she will call with any problems that may develop before the next visit here.   Harriette Ohara, MD 07/17/18

## 2018-07-17 NOTE — Assessment & Plan Note (Signed)
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 Mammaprint high risk: Dose since Adriamycin and Cytoxan x2 followed by Taxol weekly x1 09/23/2017-10/21/17 stopped for toxicities 2. adjuvant radiation therapy 11/28/17- 01/13/18 3. Followed by adjuvant antiestrogen therapy -------------------------------------------------------------------------------------------------------------------------------------- Plan: Anti estrogen therapy with anastrozole 1 mg daily starting 02/05/18  Anastrozole toxicities:  Denies any hot flashes or arthralgias or myalgias.  Tremors Generalized muscle weakness  Patient is able to drive by herself and walk to the appointment.  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  Prior blood work showing elevation of alkaline phosphatase and AST: I will request a copy of her blood work from her primary care physician.  Return to clinic in 6 months for follow-up.

## 2018-07-30 DIAGNOSIS — F324 Major depressive disorder, single episode, in partial remission: Secondary | ICD-10-CM | POA: Diagnosis not present

## 2018-07-30 DIAGNOSIS — I9589 Other hypotension: Secondary | ICD-10-CM | POA: Diagnosis not present

## 2018-07-30 DIAGNOSIS — E7800 Pure hypercholesterolemia, unspecified: Secondary | ICD-10-CM | POA: Diagnosis not present

## 2018-07-30 DIAGNOSIS — C773 Secondary and unspecified malignant neoplasm of axilla and upper limb lymph nodes: Secondary | ICD-10-CM | POA: Diagnosis not present

## 2018-07-30 DIAGNOSIS — C50919 Malignant neoplasm of unspecified site of unspecified female breast: Secondary | ICD-10-CM | POA: Diagnosis not present

## 2018-07-30 DIAGNOSIS — F419 Anxiety disorder, unspecified: Secondary | ICD-10-CM | POA: Diagnosis not present

## 2018-08-26 ENCOUNTER — Telehealth: Payer: Self-pay

## 2018-08-26 NOTE — Telephone Encounter (Signed)
Pt called to report that she had felt a movable pea sized lump on her R mastectomy site, located 6 o'clock position of her R chest. Pt states that she noticed this tiny lump last night, while laying down and performing a self breast exam. Pt states that it is non painful, movable, and located around the surgical site. There is some scarring and discoloration from surgery/radiation. Told pt to monitor closely and call within the next few weeks if it doesn't go away. Pt was in the office to see Dr.[name] in October and had a breast exam that was normal. Pt verbalized understanding and will continue to monitor lump, and call in a few weeks for updates. No further needs at this time.

## 2018-10-20 ENCOUNTER — Ambulatory Visit: Payer: Medicare Other | Admitting: Hematology and Oncology

## 2018-10-23 ENCOUNTER — Telehealth: Payer: Self-pay

## 2018-10-23 NOTE — Telephone Encounter (Signed)
Pt called to report that she is having some sharp shooting pain around her right mastectomy site, over the past few days. Pt has had some baseline mild soreness from scar tissue, but has been worried since the intermittent sharp pain has been more frequent the last few days.   Pt denies any new lumps/knots than her usual. Asked if pt did any physical exercise over the past few days. 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 Told pt that she might have over used her R extremity, and has caused muscular contraction that is causing her intermittent pain with movement. No swelling, redness, or new palpated lump/nodule.   Advised that pt rest, ice/heat the affected area. May take ibuprofen and to space out work out and playing her instrument to prevent any further pain. Pt verbalized understanding and will call with any more concerns.

## 2018-12-04 IMAGING — MR MR HEAD WO/W CM
11 of 13 series · 38 of 48 positions shown · IV contrast (multihance)
Comparison: 09/27/2011

CLINICAL DATA: Tremor. Generalized weakness. History of breast
cancer.

EXAM:
MRI HEAD WITHOUT AND WITH CONTRAST
TECHNIQUE: Multiplanar, multiecho pulse sequences of the brain and surrounding
structures were obtained without and with intravenous contrast.
CONTRAST:  13mL MULTIHANCE GADOBENATE DIMEGLUMINE 529 MG/ML IV SOLN

[Series 3: T1 · sagittal · 5.0mm · 0.47mm/px · 1 of 23 slices shown]
[im 1/23]
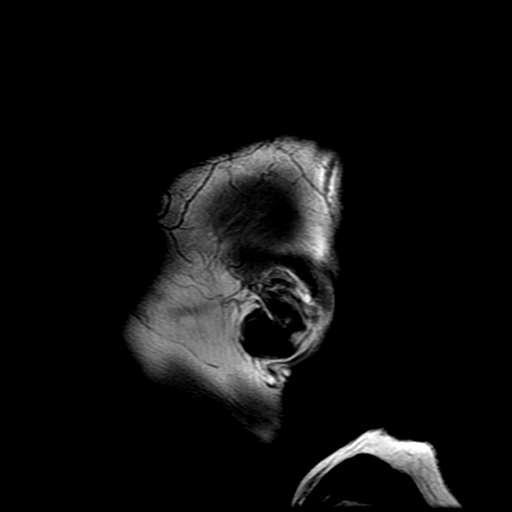

[Series 4: DWI · axial · 3.0mm · 1.09mm/px · z∈[-28,+113]mm · 9 of 96 slices shown (1 of 4)]
[im 1/96]
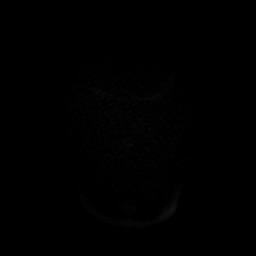
[im 12/96]
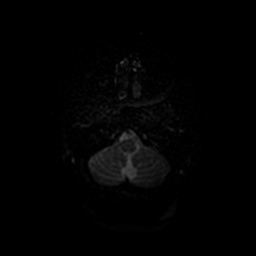
[im 24/96]
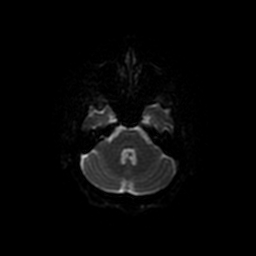
[im 36/96]
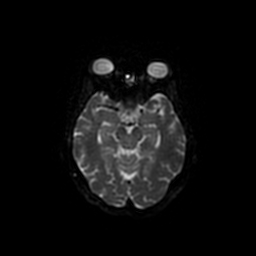
[im 48/96]
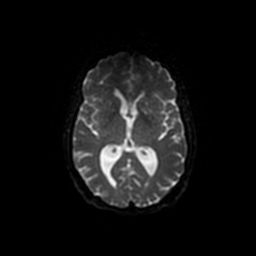
[im 60/96]
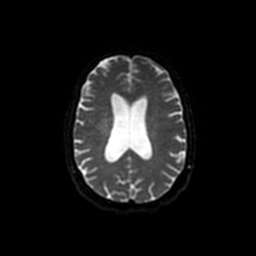
[im 72/96]
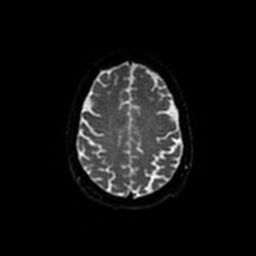
[im 84/96]
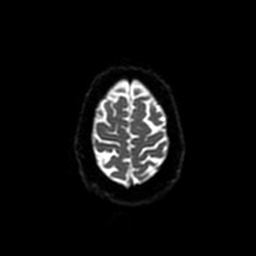
[im 96/96]
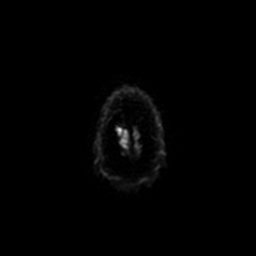

[Series 5: T2 · axial · 5.0mm · 0.43mm/px · z∈[-29,+111]mm · 2 of 21 slices shown]
[im 1/21]
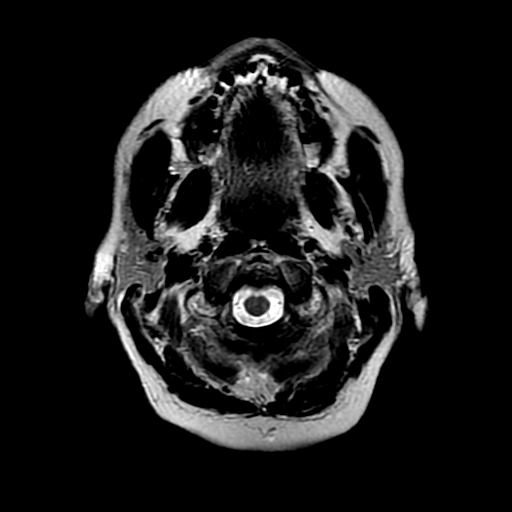
[im 21/21]
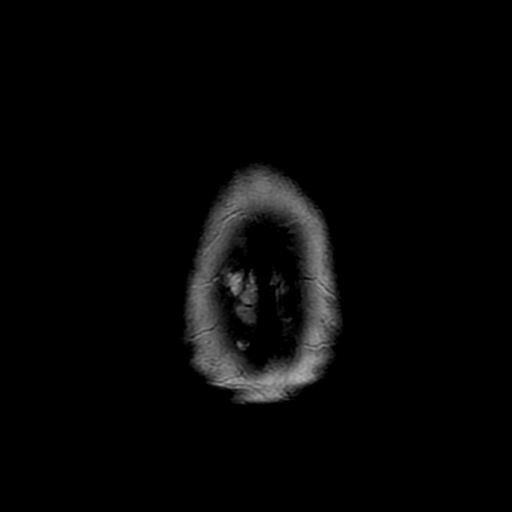

[Series 6: DWI · coronal · 5.0mm · 1.09mm/px · 6 of 68 slices shown (2 of 4)]
[im 1/68]
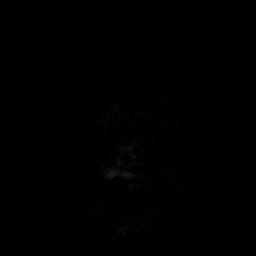
[im 14/68]
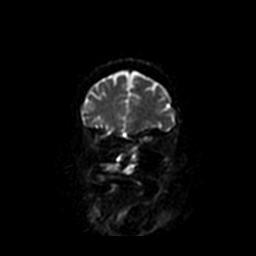
[im 27/68]
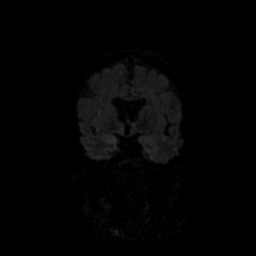
[im 41/68]
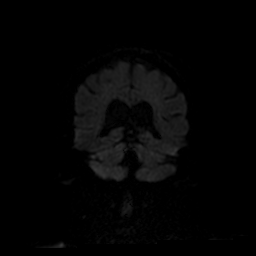
[im 54/68]
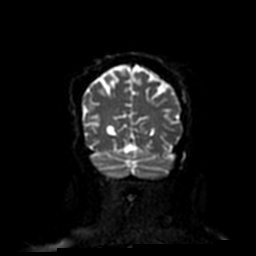
[im 68/68]
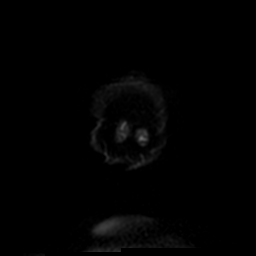

[Series 7: FLAIR · axial · 5.0mm · 0.86mm/px · z∈[-31,+113]mm · 2 of 25 slices shown]
[im 1/25]
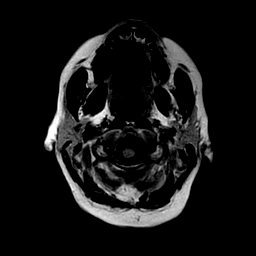
[im 25/25]
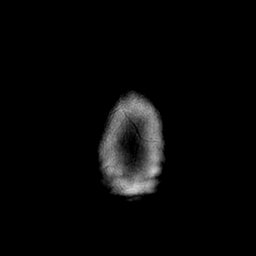

[Series 8: ax mpgr · axial · 5.0mm · 0.43mm/px · z∈[-29,+111]mm · 2 of 21 slices shown]
[im 1/21]
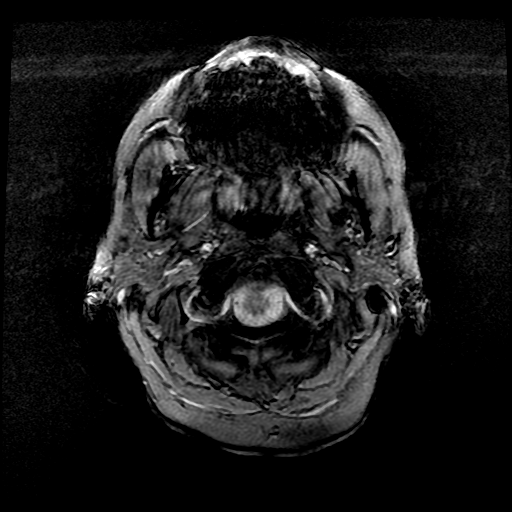
[im 21/21]
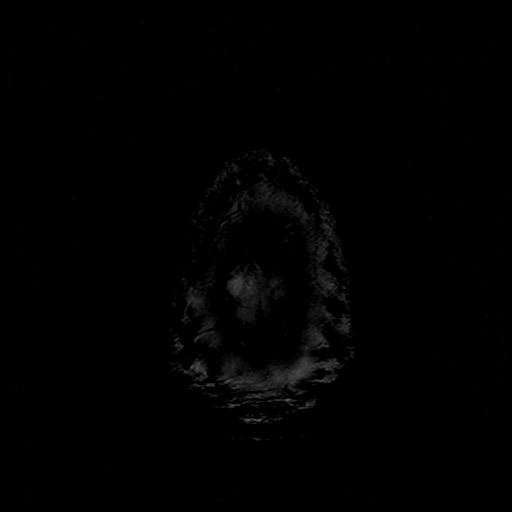

[Series 10: T2 post-contrast · coronal · 5.0mm · 0.90mm/px · 3 of 27 slices shown]
[im 1/27]
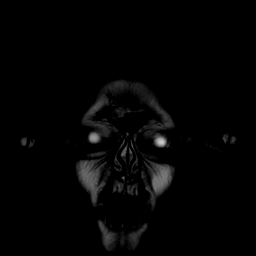
[im 14/27]
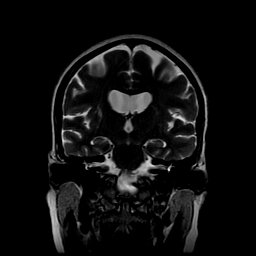
[im 27/27]
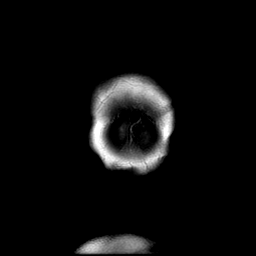

[Series 12: T1 post-contrast · coronal · 5.0mm · 0.45mm/px · 3 of 27 slices shown (1 of 2)]
[im 1/27]
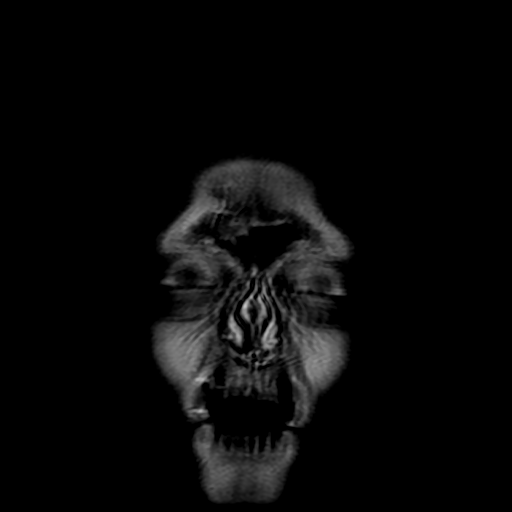
[im 14/27]
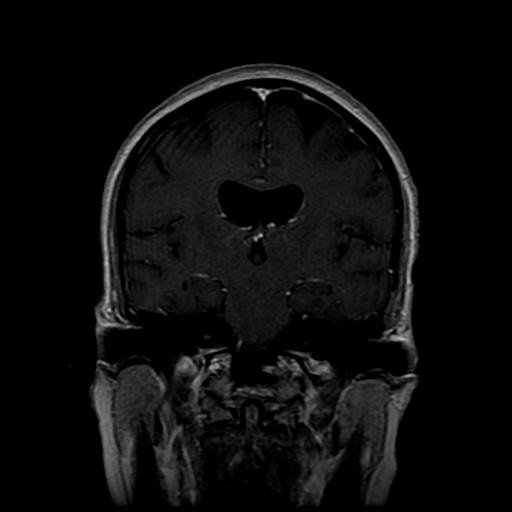
[im 27/27]
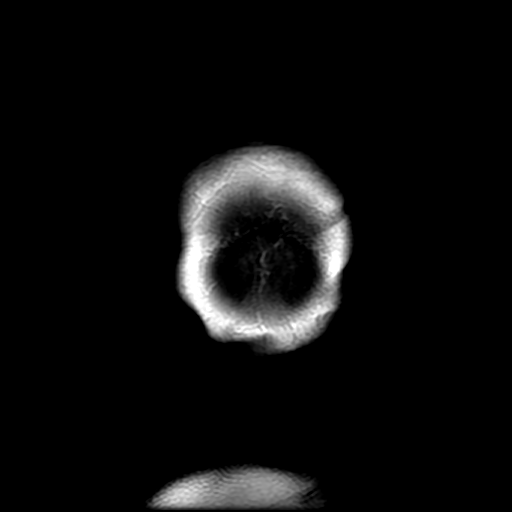

[Series 13: T1 post-contrast · sagittal · 5.0mm · 0.47mm/px · 2 of 23 slices shown (2 of 2)]
[im 1/23]
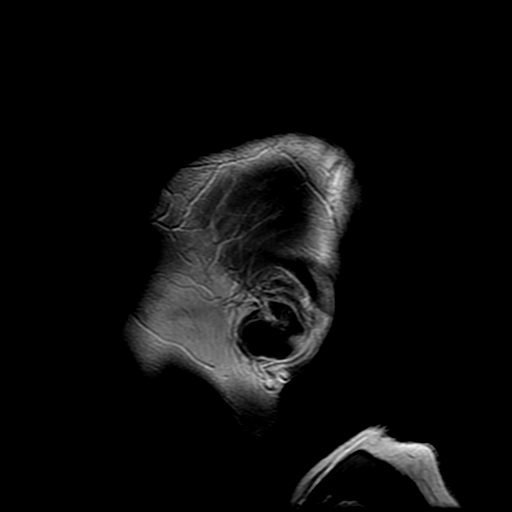
[im 23/23]
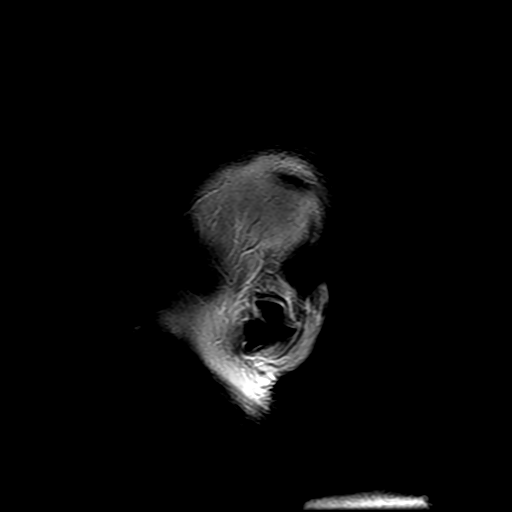

[Series 400: DWI · axial · 3.0mm · 1.09mm/px · z∈[-28,+113]mm · 5 of 48 slices shown (3 of 4)]
[im 1/48]
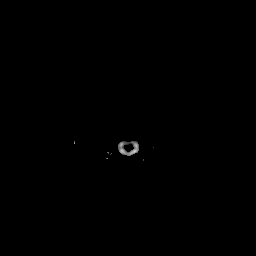
[im 12/48]
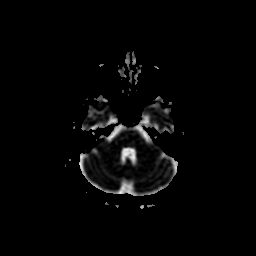
[im 24/48]
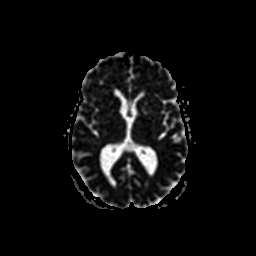
[im 36/48]
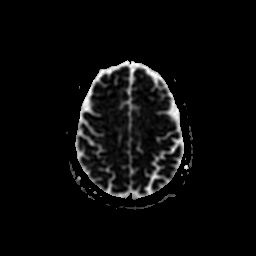
[im 48/48]
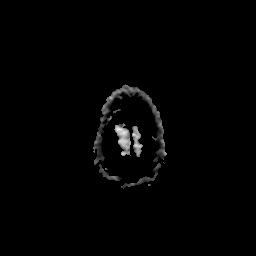

[Series 600: DWI · coronal · 5.0mm · 1.09mm/px · 3 of 34 slices shown (4 of 4)]
[im 1/34]
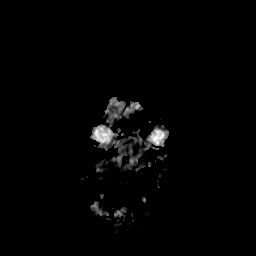
[im 17/34]
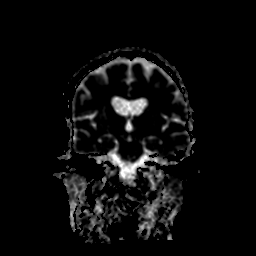
[im 34/34]
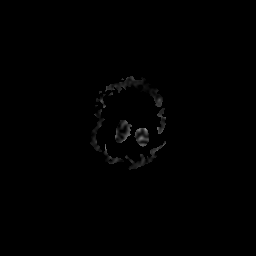

[38 of 48 positions shown; findings below may reference images not displayed]

FINDINGS: Brain: There is no evidence of acute infarct, intracranial
hemorrhage, mass, midline shift, or extra-axial fluid collection.
Mild generalized cerebral atrophy is unchanged and not greater than
expected for age. T2 hyperintensities in the deep cerebral white
matter have progressed from the prior study and are nonspecific but
compatible with minimal chronic small vessel ischemia. There is a
new chronic lacunar infarct in the left basal ganglia. No abnormal
enhancement is identified.

Vascular: Major intracranial vascular flow voids are preserved.

Skull and upper cervical spine: Unremarkable bone marrow signal.

Sinuses/Orbits: Bilateral cataract extraction. Paranasal sinuses and
mastoid air cells are clear.

Other: None.
IMPRESSION: 1. No evidence of acute intracranial abnormality or metastatic
disease.
2. Minimal chronic small vessel ischemic disease, progressed from

## 2018-12-16 DIAGNOSIS — F419 Anxiety disorder, unspecified: Secondary | ICD-10-CM | POA: Diagnosis not present

## 2018-12-16 DIAGNOSIS — C50919 Malignant neoplasm of unspecified site of unspecified female breast: Secondary | ICD-10-CM | POA: Diagnosis not present

## 2018-12-16 DIAGNOSIS — Z0000 Encounter for general adult medical examination without abnormal findings: Secondary | ICD-10-CM | POA: Diagnosis not present

## 2018-12-16 DIAGNOSIS — M8588 Other specified disorders of bone density and structure, other site: Secondary | ICD-10-CM | POA: Diagnosis not present

## 2018-12-16 DIAGNOSIS — Z1389 Encounter for screening for other disorder: Secondary | ICD-10-CM | POA: Diagnosis not present

## 2018-12-16 DIAGNOSIS — C773 Secondary and unspecified malignant neoplasm of axilla and upper limb lymph nodes: Secondary | ICD-10-CM | POA: Diagnosis not present

## 2018-12-16 DIAGNOSIS — E2839 Other primary ovarian failure: Secondary | ICD-10-CM | POA: Diagnosis not present

## 2018-12-16 DIAGNOSIS — E7800 Pure hypercholesterolemia, unspecified: Secondary | ICD-10-CM | POA: Diagnosis not present

## 2018-12-16 DIAGNOSIS — Z6826 Body mass index (BMI) 26.0-26.9, adult: Secondary | ICD-10-CM | POA: Diagnosis not present

## 2018-12-16 DIAGNOSIS — F324 Major depressive disorder, single episode, in partial remission: Secondary | ICD-10-CM | POA: Diagnosis not present

## 2019-01-19 ENCOUNTER — Telehealth: Payer: Self-pay | Admitting: Hematology and Oncology

## 2019-01-19 NOTE — Telephone Encounter (Signed)
Left voicemail for patient regarding her Webex appointment. I told her told download the [employer] App onto one of her devices. And sent her join link to [email or web address]. Told her to click the green "join meeting" button and that would populate the appointment details. Would also prompt her to download the app if she hadn't already.   Told her if she was unable to do a video call with Dr. Lindi Adie, to call us back and we would convert it to a phone call visit.

## 2019-01-21 NOTE — Progress Notes (Signed)
HEMATOLOGY-ONCOLOGY [place] VISIT PROGRESS NOTE  I connected with Illa Level on 01/22/2019 at 10:30 AM EDT by Webex video conference and verified that I am speaking with the correct person using two identifiers.  I discussed the limitations, risks, security and privacy concerns of performing an evaluation and management service by Webex and the availability of in person appointments.  I also discussed with the patient that there may be a patient responsible charge related to this service. The patient expressed understanding and agreed to proceed.  Patient's Location: Home Physician Location: Clinic  CHIEF COMPLIANT: Follow-up on anastrozole therapy  INTERVAL HISTORY: Gloria Ware is a 73 y.o. female with above-mentioned history of right breast cancer treated with mastectomy followed by adjuvant chemotherapy and radiation who is currently on antiestrogen therapy with anastrozole.  Her energy levels are improving.  She is able to go on long walks.  This is been helping her joints as well.  She noticed a palpable nodule in the surgical scar.  This feels like a bone to her.    Malignant neoplasm of upper-inner quadrant of right breast in female, estrogen receptor positive ([place])   06/21/2017 Initial Diagnosis    Screening detected right breast mass and calcifications 1 cm at 1:00 position biopsy of mass and calcifications revealed invasive lobular cancer with pleomorphic features, grade 2, axillary lymph node biopsy positive for cancer, ER/PR positive HER-2 negative with a Ki-67 of 40%, T1b N1 stage IB AJCC 8    06/27/2017 Genetic Testing    Genetic testing was negative and did not reveal any mutations.  Genes tested include: ALK, APC, ATM, AXIN2, BAP1, BARD1, BLM, BMPR1A, BRCA1, BRCA2, BRIP1, CASR, CDC73, CDH1, CDK4, CDKN1B, CDKN1C, CDKN2A, CEBPA, CHEK2, CTNNA1, DICER1, DIS3L2, EGFR, EPCAM, FH, FLCN, GATA2, GPC3, GREM1, HOXB13, HRAS, KIT, MAX, MEN1, MET, MITF, MLH1, MSH2, MSH3, MSH6, MUTYH, NBN,  NF1, NF2, NTHL1, PALB2, PDGFRA, PHOX2B, PMS2, POLD1, POLE, POT1, PRKAR1A, PTCH1, PTEN, RAD50, RAD51C, RAD51D, RB1, RECQL4, RET, RUNX1, SDHA, SDHAF2, SDHB, SDHC, SDHD, SMAD4, SMARCA4, SMARCB1, SMARCE1, STK11, SUFU, TERC, TERT, TMEM127, TP53, TSC1, TSC2, VHL, WRN, WT1.    08/06/2017 Surgery    Right mastectomy: Scattered foci of invasive lobular cancer, grade 2, 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, LCIS, margins negative, 2/6 lymph nodes +1 with isolated tumor cells, T1BN1 stage I the AJCC 8 ER 95%, PR 30%, HER-2 negative, Ki-67 40%    08/12/2017 Miscellaneous    Mammaprint high risk luminal type B    09/17/2017 -  Chemotherapy    Adjuvant chemo with dose dense Adriamycin and Cytoxan X 2 (Stopped due to toxicities) followed by Taxol weekly x1 stopped due to toxicities     10/07/2017 - 10/09/2017 Hospital Admission    Adm for failure to thrive, insomnia and back pain    11/28/2017 - 01/13/2018 Radiation Therapy    Adj XRT    02/2018 -  Anti-estrogen oral therapy    Anastrozole daily     REVIEW OF SYSTEMS:   Constitutional: Denies fevers, chills or abnormal weight loss Eyes: Denies blurriness of vision Ears, nose, mouth, throat, and face: Denies mucositis or sore throat Respiratory: Denies cough, dyspnea or wheezes Cardiovascular: Denies palpitation, chest discomfort Gastrointestinal:  Denies nausea, heartburn or change in bowel habits Skin: Denies abnormal skin rashes Lymphatics: Denies new lymphadenopathy or easy bruising Neurological:Denies numbness, tingling or new weaknesses Behavioral/Psych: Mood is stable, no new changes  Extremities: No lower extremity edema Breast: denies any pain or lumps or nodules in either breasts All other systems  were reviewed with the patient and are negative.  Observations/Objective:  There were no vitals filed for this visit. There is no height or weight on file to calculate BMI.  I have reviewed the data as listed CMP Latest Ref Rng & Units 01/14/2018 11/05/2017  10/28/2017  Glucose 70 - 140 mg/dL 106 104(H) 100  BUN 7 - 26 mg/dL 13 14 19   Creatinine 0.60 - 1.10 mg/dL 0.86 0.70 0.93  Sodium 136 - 145 mmol/L 138 136 135(L)  Potassium 3.5 - 5.1 mmol/L 4.2 3.8 3.8  Chloride 98 - 109 mmol/L 103 100(L) 102  CO2 22 - 29 mmol/L 27 - 23  Calcium 8.4 - 10.4 mg/dL 9.9 - 9.2  Total Protein 6.4 - 8.3 g/dL 7.3 - 6.8  Total Bilirubin 0.2 - 1.2 mg/dL 0.2 - 0.3  Alkaline Phos 40 - 150 U/L 181(H) - 75  AST 5 - 34 U/L 30 - 26  ALT 0 - 55 U/L 65(H) - 24    Lab Results  Component Value Date   WBC 4.2 01/14/2018   HGB 12.2 01/14/2018   HCT 36.3 01/14/2018   MCV 91.9 01/14/2018   PLT 161 01/14/2018   NEUTROABS 2.7 01/14/2018      Assessment Plan:  Malignant neoplasm of upper-inner quadrant of right breast in female, estrogen receptor positive ([place]) 08/06/2017 right mastectomy: Scattered foci of invasive lobular cancer, grade 2, 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 Mammaprint high risk: Dose since Adriamycin and Cytoxan x27fllowed by Taxol weekly x1 09/23/2017-10/21/17 stopped for toxicities 2. adjuvant radiation therapy2/21/19- 01/13/18 3. Followed by adjuvant antiestrogen therapy -------------------------------------------------------------------------------------------------------------------------------------- Current treatment: Anti estrogen therapy withanastrozole1 mg daily starting 02/05/18  Anastrozole toxicities: Muscle aches and pains improved since she started exercising with walking.  She not able to go to the gym because of coronavirus.  Breast cancer surveillance: Left breast mammogram 06/25/2018: Benign breast density category C Tremors Generalized muscle weakness  Patient is able to drive by herself and walk to the appointment.  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  Small palpable nodule in the right chest wall: It feels like a bone to the patient.  I would like to examine her once the coronavirus pandemic is improved.  We will set her an appointment for her to come back and see uKoreain 4 months.  Return to clinic in 4 month for follow-up.  I discussed the assessment and treatment plan with the patient. The patient was provided an opportunity to ask questions and all were answered. The patient agreed with the plan and demonstrated an understanding of the instructions. The patient was advised to call back or seek an in-person evaluation if the symptoms worsen or if the condition fails to improve as anticipated.   I provided 20 minutes of face-to-face Web Ex time during this encounter.    VRulon Eisenmenger MD 01/22/2019    I, Molly Dorshimer, am acting as scribe for VNicholas Lose MD.  I have reviewed the above documentation for accuracy and completeness, and I agree with the above.

## 2019-01-22 ENCOUNTER — Inpatient Hospital Stay: Payer: Medicare Other | Attending: Hematology and Oncology | Admitting: Hematology and Oncology

## 2019-01-22 DIAGNOSIS — C50211 Malignant neoplasm of upper-inner quadrant of right female breast: Secondary | ICD-10-CM

## 2019-01-22 DIAGNOSIS — Z170 Estrogen receptor positive status [ER+]: Secondary | ICD-10-CM | POA: Diagnosis not present

## 2019-01-22 MED ORDER — ANASTROZOLE 1 MG PO TABS: 1.0000 mg | ORAL_TABLET | Freq: Every day | ORAL | 3 refills | Status: DC

## 2019-01-22 NOTE — Assessment & Plan Note (Addendum)
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 Mammaprint high risk: Dose since Adriamycin and Cytoxan x76fllowed by Taxol weekly x1 09/23/2017-10/21/17 stopped for toxicities 2. adjuvant radiation therapy2/21/19- 01/13/18 3. Followed by adjuvant antiestrogen therapy -------------------------------------------------------------------------------------------------------------------------------------- Current treatment: Anti estrogen therapy withanastrozole1 mg daily starting 02/05/18  Anastrozole toxicities:  Denies any hot flashes or arthralgias or myalgias.  Breast cancer surveillance: Left breast mammogram 06/25/2018: Benign breast density category C  Tremors Generalized muscle weakness  Patient is able to drive by herself and walk to the appointment.  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  Return to clinic in 1 yr for follow-up.

## 2019-03-18 ENCOUNTER — Telehealth: Payer: Self-pay | Admitting: *Deleted

## 2019-03-18 NOTE — Telephone Encounter (Signed)
Received call from pt stating she has been experiencing bone pain and leg tremors. Pt also complaint of experiencing frequent constipation and having to take OTC medication to relieve it.  Pt questioning if these symptoms could be related to her anastrozole.  Instructed pt to stop taking anastrozole x 2 weeks and to call the office with an update on her symptoms.  Pt verbalized understanding.

## 2019-04-02 ENCOUNTER — Telehealth: Payer: Self-pay | Admitting: *Deleted

## 2019-04-02 NOTE — Telephone Encounter (Signed)
Received call from pt stating she has stopped anastrozole x2 weeks with improvement in symptoms.  Pt states she is no longer experiencing bone pain or "needle sticks" in her extremities.  Pt also states she has experienced less tremors in her legs.  Per Dr. Lindi Adie, pt to take 1/2 tablet of anastrozole daily.  Pt notified and verbalized understanding.

## 2019-04-14 ENCOUNTER — Other Ambulatory Visit: Payer: Self-pay

## 2019-04-16 ENCOUNTER — Other Ambulatory Visit: Payer: Self-pay

## 2019-04-16 ENCOUNTER — Encounter: Payer: Self-pay | Admitting: Gynecology

## 2019-04-16 ENCOUNTER — Ambulatory Visit (INDEPENDENT_AMBULATORY_CARE_PROVIDER_SITE_OTHER): Payer: Medicare Other | Admitting: Gynecology

## 2019-04-16 VITALS — BP 124/80 | Ht 60.0 in | Wt 135.0 lb

## 2019-04-16 DIAGNOSIS — N952 Postmenopausal atrophic vaginitis: Secondary | ICD-10-CM

## 2019-04-16 DIAGNOSIS — M8580 Other specified disorders of bone density and structure, unspecified site: Secondary | ICD-10-CM

## 2019-04-16 DIAGNOSIS — Z853 Personal history of malignant neoplasm of breast: Secondary | ICD-10-CM

## 2019-04-16 DIAGNOSIS — Z9289 Personal history of other medical treatment: Secondary | ICD-10-CM | POA: Diagnosis not present

## 2019-04-16 DIAGNOSIS — Z01411 Encounter for gynecological examination (general) (routine) with abnormal findings: Secondary | ICD-10-CM | POA: Diagnosis not present

## 2019-04-16 NOTE — Progress Notes (Signed)
    Gloria Ware 12/20/1945 [id number]        73 y.o.  G2P2 for breast and pelvic exam.  Has a nodule in her right chest wall that she wants me to look at.  Had mastectomy 2 years ago and is noticed a small nodule since then.  Is nontender and has not changed to her self-exam.  Also notes occasionally when she voids when the urine hits externally it causes some stinging but not a consistent finding.  No dysuria frequency urgency or other UTI symptoms.  Past medical history,surgical history, problem list, medications, allergies, family history and social history were all reviewed and documented as reviewed in the EPIC chart.  ROS:  Performed with pertinent positives and negatives included in the history, assessment and plan.   Additional significant findings : None   Exam: Caryn Bee assistant Vitals:   04/16/19 1104  BP: 124/80  Weight: 135 lb (61.2 kg)  Height: 5' (1.524 m)   Body mass index is 26.37 kg/m.  General appearance:  Normal affect, orientation and appearance. Skin: Grossly normal HEENT: Without gross lesions.  No cervical or supraclavicular adenopathy. Thyroid normal.  Lungs:  Clear without wheezing, rales or rhonchi Cardiac: RR, without RMG Abdominal:  Soft, nontender, without masses, guarding, rebound, organomegaly or hernia Breasts:  Examined lying and sitting.  Left without masses, retractions, discharge or axillary adenopathy.  Right status post mastectomy.  Small BB sized nodule mid chest wall in scar line.  Nontender.  No other masses.  Axilla normal Pelvic:  Ext, BUS, Vagina: With atrophic changes  Cervix: With atrophic changes  Uterus: Anteverted, normal size, shape and contour, midline and mobile nontender   Adnexa: Without masses or tenderness    Anus and perineum: Normal   Rectovaginal: Normal sphincter tone without palpated masses or tenderness.    Assessment/Plan:  73 y.o. G2P2 female for breast and pelvic exam  1. Postmenopausal.  No significant  menopausal symptoms or any vaginal bleeding. 2. Breast cancer status post right mastectomy.  Small BB-like nodule anterior chest and scar line consistent with small area of scarring.  Has remained unchanged to the patient's exam over the past year.  Discussed differential and options to include excision versus observation.  Given the stability and benign characteristics we both are comfortable with following for now.  Patient will continue to examine herself and as long as this area remains unchanged will follow.  Otherwise she is due for her mammography in September and will follow-up with this.  Continues on anastrozole and seeing her oncologist. 3. Skin irritation with voiding occasionally.  Sounds as if urine is hitting the fragile atrophic skin and causing stinging.  Is not a consistent finding.  No other symptoms of UTI.  Unable to leave urine today.  As this is an occasional occurrence will monitor and follow-up if it becomes more of a consistent issue. 4. Osteopenia.  DEXA 2018 T score -2.1.  Schedule DEXA now at 2-year interval. 5. Pap smear 2017.  No Pap smear done today.  No history of abnormal Pap smears.  We both agree to stop screening per current screening guidelines based on age. 6. Colonoscopy 2013.  Repeat at their recommended interval. 7. Health maintenance.  No routine lab work done as patient does this elsewhere.  Follow-up in 1 year, sooner as needed.   Anastasio Auerbach MD, 12:06 PM 04/16/2019

## 2019-04-16 NOTE — Patient Instructions (Signed)
Schedule in follow-up for your bone density.  Follow-up in 1 year for annual exam

## 2019-04-29 ENCOUNTER — Telehealth: Payer: Self-pay | Admitting: *Deleted

## 2019-04-29 NOTE — Telephone Encounter (Signed)
Received call from pt stating she was having trouble sleeping at night.  RN educated pt on over the counter melatonin 3mg  to try an hour before bedtime to see if this helps.  Also educated pt on decreased screen time with phones and TV prior to bed.  Pt verbalized understanding.

## 2019-05-11 ENCOUNTER — Other Ambulatory Visit: Payer: Self-pay

## 2019-05-12 ENCOUNTER — Ambulatory Visit (INDEPENDENT_AMBULATORY_CARE_PROVIDER_SITE_OTHER): Payer: Medicare Other

## 2019-05-12 ENCOUNTER — Other Ambulatory Visit: Payer: Self-pay | Admitting: Gynecology

## 2019-05-12 DIAGNOSIS — M8589 Other specified disorders of bone density and structure, multiple sites: Secondary | ICD-10-CM

## 2019-05-12 DIAGNOSIS — M8580 Other specified disorders of bone density and structure, unspecified site: Secondary | ICD-10-CM

## 2019-05-12 DIAGNOSIS — Z780 Asymptomatic menopausal state: Secondary | ICD-10-CM

## 2019-05-18 ENCOUNTER — Encounter: Payer: Self-pay | Admitting: Gynecology

## 2019-05-18 NOTE — Assessment & Plan Note (Addendum)
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 Mammaprint high risk: Dose since Adriamycin and Cytoxan x99fllowed by Taxol weekly x1 09/23/2017-10/21/17 stopped for toxicities 2. adjuvant radiation therapy2/21/19- 01/13/18 3. Followed by adjuvant antiestrogen therapy -------------------------------------------------------------------------------------------------------------------------------------- Current treatment: Anti estrogen therapy withanastrozole1 mg daily starting 02/05/18, reduced to 0.5 mg daily 05/18/2019  Anastrozoletoxicities: 1.  Muscle and joint pains 2.  Tremors Patient cut down the dosage of anastrozole to half a tablet daily.  If she continues to have terrible symptoms and she may discontinue antiestrogen therapy.  Other option includes switching her to a different antiestrogen therapy.  Breast cancer surveillance: 1.  Left mammogram 06/25/2018: Benign breast density category C 2.  Bone density: 05/12/2019  Patient is able to drive by herself and walk to the appointment. 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  Return to clinic in 1 year for follow-up

## 2019-05-21 ENCOUNTER — Other Ambulatory Visit: Payer: Self-pay

## 2019-05-22 ENCOUNTER — Encounter: Payer: Self-pay | Admitting: Gynecology

## 2019-05-22 ENCOUNTER — Ambulatory Visit (INDEPENDENT_AMBULATORY_CARE_PROVIDER_SITE_OTHER): Payer: Medicare Other | Admitting: Gynecology

## 2019-05-22 VITALS — BP 120/78

## 2019-05-22 DIAGNOSIS — M810 Age-related osteoporosis without current pathological fracture: Secondary | ICD-10-CM | POA: Diagnosis not present

## 2019-05-22 NOTE — Patient Instructions (Signed)
Follow-up when due for your next annual exam.  We will plan on repeating your bone density in 2 years.

## 2019-05-22 NOTE — Progress Notes (Signed)
    Gloria Ware Apr 02, 1946 [id number]        73 y.o.  G2P2 presents to discuss her most recent bone density which shows T score -2.0 FRAX 32% / 16%.  Past medical history,surgical history, problem list, medications, allergies, family history and social history were all reviewed and documented in the EPIC chart.  Directed ROS with pertinent positives and negatives documented in the history of present illness/assessment and plan.  Exam: Vitals:   05/22/19 1113  BP: 120/78   General appearance:  Normal   Assessment/Plan:  73 y.o. G2P2 with history of osteopenia and increased FRAX in the past.  Had been treated with Boniva for 5 years then off for 2 years.  Restarted Actonel 2014 through 2016 and then discontinued.  DEXA this month overall looks stable from her prior studies.  There was an increased calculated FRAX recognizing though that she had been on Actonel approximately 4 years ago.  We discussed the issues of prior treatment and whether this negates FRAX calculations.  We discussed when if ever a FRAX is considered valid after treatment.  Options reviewed to include reinitiating treatment now versus monitoring.  Also recognizing that she is on anastrozole which may accelerate bone loss.  Given that her bone density overall has been stable compared to prior studies, at this point we both agree to hold on medication and repeat the DEXA in 2 years.  She is becoming more active from a weightbearing standpoint with walking and adequate calcium and vitamin D intake.   Anastasio Auerbach MD, 12:06 PM 05/22/2019

## 2019-05-24 NOTE — Progress Notes (Signed)
Patient Care Team: [name], MD as PCP - General (Family Medicine) [name], MD as Consulting Physician (General Surgery) Nicholas Lose, MD as Consulting Physician (Hematology and Oncology) Kyung Rudd, MD as Consulting Physician (Radiation Oncology) Gardenia Phlegm, NP as Nurse Practitioner (Hematology and Oncology)  DIAGNOSIS:    ICD-10-CM   1. Malignant neoplasm of upper-inner quadrant of right breast in female, estrogen receptor positive ([place])  C50.211    Z17.0     SUMMARY OF ONCOLOGIC HISTORY: Oncology History  Malignant neoplasm of upper-inner quadrant of right breast in female, estrogen receptor positive ([place])  06/21/2017 Initial Diagnosis   Screening detected right breast mass and calcifications 1 cm at 1:00 position biopsy of mass and calcifications revealed invasive lobular cancer with pleomorphic features, grade 2, axillary lymph node biopsy positive for cancer, ER/PR positive HER-2 negative with a Ki-67 of 40%, T1b N1 stage IB AJCC 8   06/27/2017 Genetic Testing   Genetic testing was negative and did not reveal any mutations.  Genes tested include: ALK, APC, ATM, AXIN2, BAP1, BARD1, BLM, BMPR1A, BRCA1, BRCA2, BRIP1, CASR, CDC73, CDH1, CDK4, CDKN1B, CDKN1C, CDKN2A, CEBPA, CHEK2, CTNNA1, DICER1, DIS3L2, EGFR, EPCAM, FH, FLCN, GATA2, GPC3, GREM1, HOXB13, HRAS, KIT, MAX, MEN1, MET, MITF, MLH1, MSH2, MSH3, MSH6, MUTYH, NBN, NF1, NF2, NTHL1, PALB2, PDGFRA, PHOX2B, PMS2, POLD1, POLE, POT1, PRKAR1A, PTCH1, PTEN, RAD50, RAD51C, RAD51D, RB1, RECQL4, RET, RUNX1, SDHA, SDHAF2, SDHB, SDHC, SDHD, SMAD4, SMARCA4, SMARCB1, SMARCE1, STK11, SUFU, TERC, TERT, TMEM127, TP53, TSC1, TSC2, VHL, WRN, WT1.   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, LCIS, margins negative, 2/6 lymph nodes +1 with isolated tumor cells, T1BN1 stage I the AJCC 8 ER 95%, PR 30%, HER-2 negative, Ki-67 40%   08/12/2017 Miscellaneous    Mammaprint high risk luminal type B   09/17/2017 -  Chemotherapy   Adjuvant chemo with dose dense Adriamycin and Cytoxan X 2 (Stopped due to toxicities) followed by Taxol weekly x1 stopped due to toxicities    10/07/2017 - 10/09/2017 Hospital Admission   Adm for failure to thrive, insomnia and back pain   11/28/2017 - 01/13/2018 Radiation Therapy   Adj XRT   02/2018 -  Anti-estrogen oral therapy   Anastrozole daily, reduced to 1/2 tablet daily on 04/02/19 due to myalgias     CHIEF COMPLIANT: Follow-up on anastrozole therapy, palpable right chest wall nodule  INTERVAL HISTORY: Gloria Ware is a 73 y.o. with above-mentioned history of right breast cancer treated with mastectomy followed by adjuvant chemotherapy and radiation who is currently on antiestrogen therapy with anastrozole 1/2 tablet daily. I last saw her 4 months ago. She presents to the clinic today for follow-up to assess a palpable right chest wall nodule.   REVIEW OF SYSTEMS:   Constitutional: Denies fevers, chills or abnormal weight loss Eyes: Denies blurriness of vision Ears, nose, mouth, throat, and face: Denies mucositis or sore throat Respiratory: Denies cough, dyspnea or wheezes Cardiovascular: Denies palpitation, chest discomfort Gastrointestinal: Denies nausea, heartburn or change in bowel habits Skin: Denies abnormal skin rashes Lymphatics: Denies new lymphadenopathy or easy bruising Neurological: Denies numbness, tingling or new weaknesses Behavioral/Psych: Mood is stable, no new changes  Extremities: No lower extremity edema Breast: right chest wall nodule All other systems were reviewed with the patient and are negative.  I have reviewed the past medical history, past surgical history, social history and family history with the patient and they are unchanged from previous  note.  ALLERGIES:  is allergic to penicillins; sulfa antibiotics; atorvastatin; and zetia [ezetimibe].  MEDICATIONS:  Current Outpatient  Medications  Medication Sig Dispense Refill  . acetaminophen (TYLENOL) 500 MG tablet Take 500 mg by mouth daily as needed for moderate pain or headache.    . anastrozole (ARIMIDEX) 1 MG tablet Take 1 tablet (1 mg total) by mouth daily. (Patient taking differently: Take 1 mg by mouth daily. Take half pill) 90 tablet 3  . aspirin 81 MG tablet Take 81 mg by mouth daily.      . Calcium Carbonate-Vitamin D (CALTRATE 600+D PO) Take 1 tablet by mouth 2 (two) times daily.    Marland Kitchen docusate sodium (COLACE) 100 MG capsule Take 100 mg by mouth at bedtime.    . feeding supplement (BOOST HIGH PROTEIN) LIQD Take by mouth every morning.    Marland Kitchen MELATONIN PO Take by mouth.    . nystatin cream (MYCOSTATIN) Apply TID until rash resolves. (Patient not taking: Reported on 05/22/2019) 30 g 0  . sertraline (ZOLOFT) 100 MG tablet Take 200 mg by mouth daily.      No current facility-administered medications for this visit.     PHYSICAL EXAMINATION: ECOG PERFORMANCE STATUS: 1 - Symptomatic but completely ambulatory  There were no vitals filed for this visit. There were no vitals filed for this visit.  GENERAL: alert, no distress and comfortable SKIN: skin color, texture, turgor are normal, no rashes or significant lesions EYES: normal, Conjunctiva are pink and non-injected, sclera clear OROPHARYNX: no exudate, no erythema and lips, buccal mucosa, and tongue normal  NECK: supple, thyroid normal size, non-tender, without nodularity LYMPH: no palpable lymphadenopathy in the cervical, axillary or inguinal LUNGS: clear to auscultation and percussion with normal breathing effort HEART: regular rate & rhythm and no murmurs and no lower extremity edema ABDOMEN: abdomen soft, non-tender and normal bowel sounds MUSCULOSKELETAL: no cyanosis of digits and no clubbing  NEURO: alert & oriented x 3 with fluent speech, no focal motor/sensory deficits EXTREMITIES: No lower extremity edema BREAST:. (exam performed in the presence of  a chaperone)  LABORATORY DATA:  I have reviewed the data as listed CMP Latest Ref Rng & Units 01/14/2018 11/05/2017 10/28/2017  Glucose 70 - 140 mg/dL 106 104(H) 100  BUN 7 - 26 mg/dL _0 Creatinine 0.60 - 1.10 mg/dL 0.86 0.70 0.93  Sodium 136 - 145 mmol/L 138 136 135(L)  Potassium 3.5 - 5.1 mmol/L 4.2 3.8 3.8  Chloride 98 - 109 mmol/L 103 100(L) 102  CO2 22 - 29 mmol/L 27 - 23  Calcium 8.4 - 10.4 mg/dL 9.9 - 9.2  Total Protein 6.4 - 8.3 g/dL 7.3 - 6.8  Total Bilirubin 0.2 - 1.2 mg/dL 0.2 - 0.3  Alkaline Phos 40 - 150 U/L 181(H) - 75  AST 5 - 34 U/L 30 - 26  ALT 0 - 55 U/L 65(H) - 24    Lab Results  Component Value Date   WBC 4.2 01/14/2018   HGB 12.2 01/14/2018   HCT 36.3 01/14/2018   MCV 91.9 01/14/2018   PLT 161 01/14/2018   NEUTROABS 2.7 01/14/2018    ASSESSMENT & PLAN:  Malignant neoplasm of upper-inner quadrant of right breast in female, estrogen receptor positive ([place]) 08/06/2017 right mastectomy: Scattered foci of invasive lobular cancer, grade 2, 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 Mammaprint high risk: Dose since Adriamycin and Cytoxan x3fllowed by Taxol weekly x1 09/23/2017-10/21/17 stopped for toxicities 2. adjuvant radiation therapy2/21/19- 01/13/18 3. Followed by adjuvant antiestrogen therapy -------------------------------------------------------------------------------------------------------------------------------------- Current treatment: Anti estrogen therapy withanastrozole1 mg daily starting 02/05/18, reduced to 0.5 mg daily 05/18/2019  Anastrozoletoxicities: 1.  Muscle and joint pains 2.  Tremors   Breast cancer surveillance: 1.  Left mammogram 06/25/2018: Benign breast density category C 2.  Bone density: 05/12/2019 3.  Breast examination 05/25/2019: Benign, right mastectomy, small palpable nodule  along the surgical scar which is related to scar tissue.  There is no concern for recurrent breast cancer.  Palpable nodule below the right breast: No clinical evidence of breast cancer recurrence.  I do not believe that there is any concern for recurrent breast cancer.  Her husband is going through several health issues related to his stomach.  Tremors: Making it difficult for her to drive by herself. This could be because of the needles and the stress related to the negative tone of the politics in the country.  Return to clinic in 1 year for follow-up    No orders of the defined types were placed in this encounter.  The patient has a good understanding of the overall plan. she agrees with it. she will call with any problems that may develop before the next visit here.  GNicholas Lose MD 05/25/2019  IJulious OkaDorshimer am acting as scribe for Dr. VNicholas Lose  I have reviewed the above documentation for accuracy and completeness, and I agree with the above.

## 2019-05-25 ENCOUNTER — Inpatient Hospital Stay: Payer: Medicare Other | Attending: Hematology and Oncology | Admitting: Hematology and Oncology

## 2019-05-25 ENCOUNTER — Other Ambulatory Visit: Payer: Self-pay

## 2019-05-25 DIAGNOSIS — Z79899 Other long term (current) drug therapy: Secondary | ICD-10-CM | POA: Diagnosis not present

## 2019-05-25 DIAGNOSIS — C50211 Malignant neoplasm of upper-inner quadrant of right female breast: Secondary | ICD-10-CM | POA: Diagnosis not present

## 2019-05-25 DIAGNOSIS — Z79811 Long term (current) use of aromatase inhibitors: Secondary | ICD-10-CM | POA: Insufficient documentation

## 2019-05-25 DIAGNOSIS — Z9011 Acquired absence of right breast and nipple: Secondary | ICD-10-CM | POA: Insufficient documentation

## 2019-05-25 DIAGNOSIS — Z923 Personal history of irradiation: Secondary | ICD-10-CM | POA: Diagnosis not present

## 2019-05-25 DIAGNOSIS — Z7982 Long term (current) use of aspirin: Secondary | ICD-10-CM | POA: Insufficient documentation

## 2019-05-25 DIAGNOSIS — Z9221 Personal history of antineoplastic chemotherapy: Secondary | ICD-10-CM | POA: Insufficient documentation

## 2019-05-25 DIAGNOSIS — Z170 Estrogen receptor positive status [ER+]: Secondary | ICD-10-CM | POA: Diagnosis not present

## 2019-05-25 MED ORDER — ANASTROZOLE 1 MG PO TABS: 0.5000 mg | ORAL_TABLET | Freq: Every day | ORAL | Status: DC

## 2019-06-03 ENCOUNTER — Other Ambulatory Visit: Payer: Self-pay | Admitting: Gynecology

## 2019-06-03 DIAGNOSIS — Z1231 Encounter for screening mammogram for malignant neoplasm of breast: Secondary | ICD-10-CM

## 2019-06-18 DIAGNOSIS — F419 Anxiety disorder, unspecified: Secondary | ICD-10-CM | POA: Diagnosis not present

## 2019-06-18 DIAGNOSIS — E7800 Pure hypercholesterolemia, unspecified: Secondary | ICD-10-CM | POA: Diagnosis not present

## 2019-06-18 DIAGNOSIS — C50919 Malignant neoplasm of unspecified site of unspecified female breast: Secondary | ICD-10-CM | POA: Diagnosis not present

## 2019-06-18 DIAGNOSIS — C773 Secondary and unspecified malignant neoplasm of axilla and upper limb lymph nodes: Secondary | ICD-10-CM | POA: Diagnosis not present

## 2019-06-18 DIAGNOSIS — F324 Major depressive disorder, single episode, in partial remission: Secondary | ICD-10-CM | POA: Diagnosis not present

## 2019-06-18 DIAGNOSIS — M8588 Other specified disorders of bone density and structure, other site: Secondary | ICD-10-CM | POA: Diagnosis not present

## 2019-06-23 DIAGNOSIS — Z23 Encounter for immunization: Secondary | ICD-10-CM | POA: Diagnosis not present

## 2019-07-16 ENCOUNTER — Encounter: Payer: Self-pay | Admitting: Gynecology

## 2019-07-21 ENCOUNTER — Ambulatory Visit
Admission: RE | Admit: 2019-07-21 | Discharge: 2019-07-21 | Disposition: A | Discharge status: Home / Self Care | Payer: Medicare Other | Source: Ambulatory Visit | Attending: Gynecology | Admitting: Gynecology

## 2019-07-21 ENCOUNTER — Other Ambulatory Visit: Payer: Self-pay

## 2019-07-21 DIAGNOSIS — Z1231 Encounter for screening mammogram for malignant neoplasm of breast: Secondary | ICD-10-CM

## 2019-09-07 ENCOUNTER — Telehealth: Payer: Self-pay | Admitting: *Deleted

## 2019-09-07 NOTE — Telephone Encounter (Signed)
Received call from pt stating she is currently taking 1/2 a tablet of anastrozole daily and is starting to experience an increase in her baseline tremors and trouble with her balance.  Pt states she spoke with MD in August regarding these symptoms and was advised to call the office if they become worse for a follow up visit.  Per MD pt advised to stop anastrozole today and will schedule a follow up apt in 2 weeks with MD.  Pt verbalized understanding.

## 2019-09-17 DIAGNOSIS — S52612A Displaced fracture of left ulna styloid process, initial encounter for closed fracture: Secondary | ICD-10-CM | POA: Diagnosis not present

## 2019-09-17 DIAGNOSIS — S52613A Displaced fracture of unspecified ulna styloid process, initial encounter for closed fracture: Secondary | ICD-10-CM | POA: Insufficient documentation

## 2019-09-17 DIAGNOSIS — S52532A Colles' fracture of left radius, initial encounter for closed fracture: Secondary | ICD-10-CM | POA: Diagnosis not present

## 2019-09-17 DIAGNOSIS — M25532 Pain in left wrist: Secondary | ICD-10-CM | POA: Diagnosis not present

## 2019-09-17 DIAGNOSIS — S52502A Unspecified fracture of the lower end of left radius, initial encounter for closed fracture: Secondary | ICD-10-CM | POA: Insufficient documentation

## 2019-09-18 DIAGNOSIS — Y999 Unspecified external cause status: Secondary | ICD-10-CM | POA: Diagnosis not present

## 2019-09-18 DIAGNOSIS — S52572A Other intraarticular fracture of lower end of left radius, initial encounter for closed fracture: Secondary | ICD-10-CM | POA: Diagnosis not present

## 2019-09-18 DIAGNOSIS — X58XXXA Exposure to other specified factors, initial encounter: Secondary | ICD-10-CM | POA: Diagnosis not present

## 2019-09-21 NOTE — Progress Notes (Signed)
Patient Care Team: [name], MD as PCP - General (Family Medicine) [name], MD as Consulting Physician (General Surgery) Nicholas Lose, MD as Consulting Physician (Hematology and Oncology) Kyung Rudd, MD as Consulting Physician (Radiation Oncology) Gardenia Phlegm, NP as Nurse Practitioner (Hematology and Oncology)  DIAGNOSIS:    ICD-10-CM   1. Malignant neoplasm of upper-inner quadrant of right breast in female, estrogen receptor positive ([place])  C50.211    Z17.0     SUMMARY OF ONCOLOGIC HISTORY: Oncology History  Malignant neoplasm of upper-inner quadrant of right breast in female, estrogen receptor positive ([place])  06/21/2017 Initial Diagnosis   Screening detected right breast mass and calcifications 1 cm at 1:00 position biopsy of mass and calcifications revealed invasive lobular cancer with pleomorphic features, grade 2, axillary lymph node biopsy positive for cancer, ER/PR positive HER-2 negative with a Ki-67 of 40%, T1b N1 stage IB AJCC 8   06/27/2017 Genetic Testing   Genetic testing was negative and did not reveal any mutations.  Genes tested include: ALK, APC, ATM, AXIN2, BAP1, BARD1, BLM, BMPR1A, BRCA1, BRCA2, BRIP1, CASR, CDC73, CDH1, CDK4, CDKN1B, CDKN1C, CDKN2A, CEBPA, CHEK2, CTNNA1, DICER1, DIS3L2, EGFR, EPCAM, FH, FLCN, GATA2, GPC3, GREM1, HOXB13, HRAS, KIT, MAX, MEN1, MET, MITF, MLH1, MSH2, MSH3, MSH6, MUTYH, NBN, NF1, NF2, NTHL1, PALB2, PDGFRA, PHOX2B, PMS2, POLD1, POLE, POT1, PRKAR1A, PTCH1, PTEN, RAD50, RAD51C, RAD51D, RB1, RECQL4, RET, RUNX1, SDHA, SDHAF2, SDHB, SDHC, SDHD, SMAD4, SMARCA4, SMARCB1, SMARCE1, STK11, SUFU, TERC, TERT, TMEM127, TP53, TSC1, TSC2, VHL, WRN, WT1.   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, LCIS, margins negative, 2/6 lymph nodes +1 with isolated tumor cells, T1BN1 stage I the AJCC 8 ER 95%, PR 30%, HER-2 negative, Ki-67 40%   08/12/2017 Miscellaneous     Mammaprint high risk luminal type B   09/17/2017 -  Chemotherapy   Adjuvant chemo with dose dense Adriamycin and Cytoxan X 2 (Stopped due to toxicities) followed by Taxol weekly x1 stopped due to toxicities    10/07/2017 - 10/09/2017 Hospital Admission   Adm for failure to thrive, insomnia and back pain   11/28/2017 - 01/13/2018 Radiation Therapy   Adj XRT   02/2018 -  Anti-estrogen oral therapy   Anastrozole daily, reduced to 1/2 tablet daily on 04/02/19 due to myalgias     CHIEF COMPLIANT: Follow-up of right breast cancer on anastrozole, worsening tremors  INTERVAL HISTORY: Gloria Ware is a 73 y.o. with above-mentioned history of right breast cancer treated with mastectomy, adjuvant chemotherapy, and radiationwho is currently on antiestrogen therapy with anastrozole 1/2 tablet daily. She presents to the clinic today for follow-up of worsening tremors.  She has seen neurology previously who had done MRIs and did not find any cause.  I believe part of the tremors is related to anxiety and panic attacks.  Every little thing upsets her.  Even watching news gets her very angry. She had stopped anastrozole for a few weeks to see if it was related to anastrozole therapy.  She tells me that nothing has changed and therefore she wants to resume anastrozole.  She does me that her husband is going through health issues and is scheduled to undergo prostate biopsy.  REVIEW OF SYSTEMS:   Constitutional: Denies fevers, chills or abnormal weight loss Eyes: Denies blurriness of vision Ears, nose, mouth, throat, and face: Denies mucositis or sore throat Respiratory: Denies cough, dyspnea or wheezes Cardiovascular: Denies palpitation, chest discomfort Gastrointestinal: Denies  nausea, heartburn or change in bowel habits Skin: Denies abnormal skin rashes Lymphatics: Denies new lymphadenopathy or easy bruising Neurological: Profound tremors, generalized weakness using a wheelchair to come to our office  today. Behavioral/Psych: Mood is stable, no new changes  Extremities: No lower extremity edema Breast: denies any pain or lumps or nodules in either breasts All other systems were reviewed with the patient and are negative.  I have reviewed the past medical history, past surgical history, social history and family history with the patient and they are unchanged from previous note.  ALLERGIES:  is allergic to penicillins; sulfa antibiotics; atorvastatin; and zetia [ezetimibe].  MEDICATIONS:  Current Outpatient Medications  Medication Sig Dispense Refill  . acetaminophen (TYLENOL) 500 MG tablet Take 500 mg by mouth daily as needed for moderate pain or headache.    . anastrozole (ARIMIDEX) 1 MG tablet Take 0.5 tablets (0.5 mg total) by mouth daily.    Marland Kitchen aspirin 81 MG tablet Take 81 mg by mouth daily.      . Calcium Carbonate-Vitamin D (CALTRATE 600+D PO) Take 1 tablet by mouth 2 (two) times daily.    Marland Kitchen docusate sodium (COLACE) 100 MG capsule Take 100 mg by mouth at bedtime.    . feeding supplement (BOOST HIGH PROTEIN) LIQD Take by mouth every morning.    Marland Kitchen MELATONIN PO Take by mouth.    . propranolol (INDERAL) 10 MG tablet Take 1 tablet (10 mg total) by mouth 2 (two) times daily. 60 tablet 3  . sertraline (ZOLOFT) 100 MG tablet Take 200 mg by mouth daily.      No current facility-administered medications for this visit.    PHYSICAL EXAMINATION: ECOG PERFORMANCE STATUS: 2 - Symptomatic, <50% confined to bed  Vitals:   09/22/19 1149  BP: (!) 137/92  Pulse: (!) 108  Resp: 17  Temp: 98 F (36.7 C)  SpO2: 100%   Filed Weights   09/22/19 1149  Weight: 133 lb (60.3 kg)    GENERAL: Anxious alert and oriented, wheelchair SKIN: skin color, texture, turgor are normal, no rashes or significant lesions EYES: normal, Conjunctiva are pink and non-injected, sclera clear OROPHARYNX: no exudate, no erythema and lips, buccal mucosa, and tongue normal  NECK: supple, thyroid normal size,  non-tender, without nodularity LYMPH: no palpable lymphadenopathy in the cervical, axillary or inguinal LUNGS: clear to auscultation and percussion with normal breathing effort HEART: regular rate & rhythm and no murmurs and no lower extremity edema ABDOMEN: abdomen soft, non-tender and normal bowel sounds MUSCULOSKELETAL: no cyanosis of digits and no clubbing  NEURO: Severe tremors bilaterally EXTREMITIES: No lower extremity edema  LABORATORY DATA:  I have reviewed the data as listed CMP Latest Ref Rng & Units 01/14/2018 11/05/2017 10/28/2017  Glucose 70 - 140 mg/dL 106 104(H) 100  BUN 7 - 26 mg/dL _0 Creatinine 0.60 - 1.10 mg/dL 0.86 0.70 0.93  Sodium 136 - 145 mmol/L 138 136 135(L)  Potassium 3.5 - 5.1 mmol/L 4.2 3.8 3.8  Chloride 98 - 109 mmol/L 103 100(L) 102  CO2 22 - 29 mmol/L 27 - 23  Calcium 8.4 - 10.4 mg/dL 9.9 - 9.2  Total Protein 6.4 - 8.3 g/dL 7.3 - 6.8  Total Bilirubin 0.2 - 1.2 mg/dL 0.2 - 0.3  Alkaline Phos 40 - 150 U/L 181(H) - 75  AST 5 - 34 U/L 30 - 26  ALT 0 - 55 U/L 65(H) - 24    Lab Results  Component Value Date   WBC  4.2 01/14/2018   HGB 12.2 01/14/2018   HCT 36.3 01/14/2018   MCV 91.9 01/14/2018   PLT 161 01/14/2018   NEUTROABS 2.7 01/14/2018    ASSESSMENT & PLAN:  Malignant neoplasm of upper-inner quadrant of right breast in female, estrogen receptor positive ([place]) 08/06/2017 right mastectomy: Scattered foci of invasive lobular cancer, grade 2, 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 Mammaprint high risk: Dose since Adriamycin and Cytoxan x9fllowed by Taxol weekly x1 09/23/2017-10/21/17 stopped for toxicities 2. adjuvant radiation therapy2/21/19- 01/13/18 3. Followed by adjuvant antiestrogen  therapy -------------------------------------------------------------------------------------------------------------------------------------- Current treatment: Anti estrogen therapy withanastrozole1 mg daily starting 02/05/18, reduced to 0.5 mg daily 05/18/2019 She stopped anastrozole briefly but her symptoms did not improve.  Anastrozoletoxicities: 1.  Muscle and joint pains 2.  Tremors: I sent a prescription for propranolol 10 mg p.o. twice daily.  This is a very low dose given her history of low blood pressures. 3.  Emotional issues: Patient has severe anxiety and it appears that everything upsets her significantly.  I will request our counselor/social worker to be in touch with her and provide counseling help.   Recommended that she resume anastrozole at half a tablet daily.   Breast cancer surveillance: 1.  Left mammogram 07/22/2019: Benign breast density category C 2.  Bone density: 05/12/2019: Osteopenia T score -2 3.  Breast examination 05/25/2019: Benign, right mastectomy, small palpable nodule along the surgical scar which is related to scar tissue.  There is no concern for recurrent breast cancer.  Her husband is going through several health issues related to his stomach.  He is also scheduled to undergo prostate biopsy.  Return to clinic in 4 months for follow-up    No orders of the defined types were placed in this encounter.  The patient has a good understanding of the overall plan. she agrees with it. she will call with any problems that may develop before the next visit here.  GNicholas Lose MD 09/22/2019  IJulious OkaDorshimer, am acting as scribe for Dr. VNicholas Lose  I have reviewed the above document for accuracy and completeness, and I agree with the above.

## 2019-09-22 ENCOUNTER — Other Ambulatory Visit: Payer: Self-pay

## 2019-09-22 ENCOUNTER — Inpatient Hospital Stay: Payer: Medicare Other | Attending: Hematology and Oncology | Admitting: Hematology and Oncology

## 2019-09-22 DIAGNOSIS — Z79811 Long term (current) use of aromatase inhibitors: Secondary | ICD-10-CM | POA: Diagnosis not present

## 2019-09-22 DIAGNOSIS — Z7982 Long term (current) use of aspirin: Secondary | ICD-10-CM | POA: Insufficient documentation

## 2019-09-22 DIAGNOSIS — Z9221 Personal history of antineoplastic chemotherapy: Secondary | ICD-10-CM | POA: Insufficient documentation

## 2019-09-22 DIAGNOSIS — C50211 Malignant neoplasm of upper-inner quadrant of right female breast: Secondary | ICD-10-CM | POA: Insufficient documentation

## 2019-09-22 DIAGNOSIS — R627 Adult failure to thrive: Secondary | ICD-10-CM | POA: Insufficient documentation

## 2019-09-22 DIAGNOSIS — Z79899 Other long term (current) drug therapy: Secondary | ICD-10-CM | POA: Diagnosis not present

## 2019-09-22 DIAGNOSIS — Z923 Personal history of irradiation: Secondary | ICD-10-CM | POA: Insufficient documentation

## 2019-09-22 DIAGNOSIS — M549 Dorsalgia, unspecified: Secondary | ICD-10-CM | POA: Diagnosis not present

## 2019-09-22 DIAGNOSIS — Z170 Estrogen receptor positive status [ER+]: Secondary | ICD-10-CM | POA: Insufficient documentation

## 2019-09-22 DIAGNOSIS — G4700 Insomnia, unspecified: Secondary | ICD-10-CM | POA: Diagnosis not present

## 2019-09-22 MED ORDER — PROPRANOLOL HCL 10 MG PO TABS: 10.0000 mg | ORAL_TABLET | Freq: Two times a day (BID) | ORAL | 3 refills | Status: DC

## 2019-09-22 NOTE — Assessment & Plan Note (Addendum)
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 Mammaprint high risk: Dose since Adriamycin and Cytoxan x54fllowed by Taxol weekly x1 09/23/2017-10/21/17 stopped for toxicities 2. adjuvant radiation therapy2/21/19- 01/13/18 3. Followed by adjuvant antiestrogen therapy -------------------------------------------------------------------------------------------------------------------------------------- Current treatment: Anti estrogen therapy withanastrozole1 mg daily starting 02/05/18, reduced to 0.5 mg daily 05/18/2019  Anastrozoletoxicities: 1.  Muscle and joint pains 2.  Tremors   Breast cancer surveillance: 1.  Left mammogram 07/22/2019: Benign breast density category C 2.  Bone density: 05/12/2019: Osteopenia T score -2 3.  Breast examination 05/25/2019: Benign, right mastectomy, small palpable nodule along the surgical scar which is related to scar tissue.  There is no concern for recurrent breast cancer.  Palpable nodule below the right breast: No clinical evidence of breast cancer recurrence.  I do not believe that there is any concern for recurrent breast cancer.  Her husband is going through several health issues related to his stomach.  Tremors: Making it difficult for her to drive by herself.    Return to clinic in 1 year for follow-up

## 2019-09-23 ENCOUNTER — Telehealth: Payer: Self-pay | Admitting: Hematology and Oncology

## 2019-09-23 NOTE — Telephone Encounter (Signed)
I left a message regarding schedule  

## 2019-09-30 ENCOUNTER — Telehealth: Payer: Self-pay

## 2019-09-30 NOTE — Telephone Encounter (Signed)
RN returned call, voicemail left for return call.  

## 2019-10-05 ENCOUNTER — Telehealth: Payer: Self-pay

## 2019-10-05 NOTE — Telephone Encounter (Signed)
Pt reports increased frequency with tremors.  Pt started back anastrozole at half tablet daily,  tremors have increased since then.  Pt concerned with stability and balance, increased difficulty with standing and sitting.    Pt is concerned with stopping medication, fearful of breast cancer reoccurring.  RN informed patient this would be reviewed with MD for further recommendations.

## 2019-10-06 ENCOUNTER — Encounter: Payer: Self-pay | Admitting: *Deleted

## 2019-10-06 ENCOUNTER — Telehealth: Payer: Self-pay

## 2019-10-06 DIAGNOSIS — R251 Tremor, unspecified: Secondary | ICD-10-CM

## 2019-10-06 NOTE — Telephone Encounter (Signed)
MD reviewed pervious encounter.  Per MD recommendations neurology referral will be placed, and patient is to continue Anastrozole at 1/2 tablet daily.    Pt voiced understanding, referral placed.

## 2019-10-06 NOTE — Progress Notes (Signed)
[place] Work  Clinical Social Work received referral form medical oncology for emotional support and counseling resources.  CSW contacted patient at home to offer support and assess for needs.  CSW left patient a message offering support and information on counseling options.  CSW provided contact information and encouraged patient to return call.  Johnnye Lana, MSW, LCSW, OSW-C Clinical Social Worker [facility] [phone]

## 2019-10-07 ENCOUNTER — Encounter: Payer: Self-pay | Admitting: *Deleted

## 2019-10-07 ENCOUNTER — Telehealth: Payer: Self-pay

## 2019-10-07 NOTE — Progress Notes (Signed)
[place] Clinical Social Work  Holiday representative connected with patient by phone to offer support and explore counseling resources.  CSW team had previously connected patient with [name], LCSW.  Patient stated she found that to be very helpful, but Ms. Clarene Reamer moved about a year ago and patient now feels she needs to establish a new therapeutic/counseling relationship.  Patient stated she has been experiencing increased anxiety and depression.  Patient stated Ms. Clarene Reamer had given patient 2 referrals that also accepted her insurance.  CSW encouraged patient to contact those providers.  CSW and patient also explored options at [facility] and [facility].  CSW provided contact information.  Patient plans to contact therapist referred by Ms. Clarene Reamer, and will contact CSW if she has any difficulties.      Johnnye Lana, MSW, LCSW, OSW-C Clinical Social Worker [facility] [phone]

## 2019-10-07 NOTE — Telephone Encounter (Signed)
RN returned call, voicemail left for return call.  

## 2019-10-14 ENCOUNTER — Encounter: Payer: Self-pay | Admitting: Neurology

## 2019-10-14 NOTE — Progress Notes (Signed)
Pt scheduled today.  Multiple attempts to get her onto the platform but pt unable.  Will schedule in person visit

## 2019-10-15 ENCOUNTER — Telehealth: Payer: Self-pay

## 2019-10-15 ENCOUNTER — Telehealth (INDEPENDENT_AMBULATORY_CARE_PROVIDER_SITE_OTHER): Payer: Medicare Other | Admitting: Neurology

## 2019-10-15 ENCOUNTER — Other Ambulatory Visit: Payer: Self-pay

## 2019-10-15 NOTE — Telephone Encounter (Signed)
Pt is aware.  

## 2019-10-15 NOTE — Telephone Encounter (Signed)
Patient stated that she is concerned because her chemo/radiation symptoms are now coming back. She started discussing anxiety and I asked if she sees anyone for that and she is seeing a Social worker. She said her anxiety and depression are setting her tremors off worse and are inconsistent. She stated that she understands that Dr Tat does not treat anxiety nor depression. She wants the doctor to be aware. She feels her rescheduled date is to far out and she does not know what she needs to do in the mean time. She just would like to understand why the tremors are coming back

## 2019-10-15 NOTE — Telephone Encounter (Signed)
She did have an appointment today but she could not log onto the video platform and we tried several times.  I have not seen her in 2 years so can't comment on what is going on today but when I saw her last, all of her tremor was based in anxiety.  I do know that I read oncology notes and they felt that tremor was from anxiety and recommended counseling.  I can't provide recommendations until I see her.  Her appointment is only 3 weeks from now.  I needed to see her in person since she couldn't get onto the video.

## 2019-10-16 DIAGNOSIS — S52532A Colles' fracture of left radius, initial encounter for closed fracture: Secondary | ICD-10-CM | POA: Diagnosis not present

## 2019-10-16 DIAGNOSIS — M25632 Stiffness of left wrist, not elsewhere classified: Secondary | ICD-10-CM | POA: Diagnosis not present

## 2019-10-16 DIAGNOSIS — Z4789 Encounter for other orthopedic aftercare: Secondary | ICD-10-CM | POA: Diagnosis not present

## 2019-10-20 DIAGNOSIS — F411 Generalized anxiety disorder: Secondary | ICD-10-CM | POA: Diagnosis not present

## 2019-10-27 DIAGNOSIS — F411 Generalized anxiety disorder: Secondary | ICD-10-CM | POA: Diagnosis not present

## 2019-11-04 DIAGNOSIS — B078 Other viral warts: Secondary | ICD-10-CM | POA: Diagnosis not present

## 2019-11-04 DIAGNOSIS — Z23 Encounter for immunization: Secondary | ICD-10-CM | POA: Diagnosis not present

## 2019-11-04 DIAGNOSIS — R202 Paresthesia of skin: Secondary | ICD-10-CM | POA: Diagnosis not present

## 2019-11-04 DIAGNOSIS — L219 Seborrheic dermatitis, unspecified: Secondary | ICD-10-CM | POA: Diagnosis not present

## 2019-11-04 DIAGNOSIS — D2262 Melanocytic nevi of left upper limb, including shoulder: Secondary | ICD-10-CM | POA: Diagnosis not present

## 2019-11-04 DIAGNOSIS — D2261 Melanocytic nevi of right upper limb, including shoulder: Secondary | ICD-10-CM | POA: Diagnosis not present

## 2019-11-09 ENCOUNTER — Telehealth: Payer: 59 | Admitting: Neurology

## 2019-11-10 DIAGNOSIS — F411 Generalized anxiety disorder: Secondary | ICD-10-CM | POA: Diagnosis not present

## 2019-11-13 DIAGNOSIS — Z4789 Encounter for other orthopedic aftercare: Secondary | ICD-10-CM | POA: Diagnosis not present

## 2019-11-13 DIAGNOSIS — S52532A Colles' fracture of left radius, initial encounter for closed fracture: Secondary | ICD-10-CM | POA: Diagnosis not present

## 2019-11-16 ENCOUNTER — Telehealth: Payer: Self-pay | Admitting: *Deleted

## 2019-11-16 NOTE — Telephone Encounter (Signed)
Received call from pt stating she exercised and lifted weights over the weekend and started to experience a sharp pain in her right chest where she has had a mastectomy.  Pt describes the pain as a sharp needle pricking pain that comes and goes and is not relieved with tylenol.  Per MD pt to follow up with surgeon Dr. Barry Dienes.  Pt verbalized understanding and appreciative of the advice.

## 2019-11-17 DIAGNOSIS — R222 Localized swelling, mass and lump, trunk: Secondary | ICD-10-CM | POA: Diagnosis not present

## 2019-11-17 DIAGNOSIS — C50211 Malignant neoplasm of upper-inner quadrant of right female breast: Secondary | ICD-10-CM | POA: Diagnosis not present

## 2019-11-18 ENCOUNTER — Other Ambulatory Visit: Payer: Self-pay | Admitting: General Surgery

## 2019-11-18 DIAGNOSIS — R222 Localized swelling, mass and lump, trunk: Secondary | ICD-10-CM

## 2019-11-23 ENCOUNTER — Other Ambulatory Visit: Payer: Self-pay | Admitting: *Deleted

## 2019-11-23 MED ORDER — ANASTROZOLE 1 MG PO TABS: 0.5000 mg | ORAL_TABLET | Freq: Every day | ORAL | 1 refills | Status: DC

## 2019-11-24 ENCOUNTER — Ambulatory Visit
Admission: RE | Admit: 2019-11-24 | Discharge: 2019-11-24 | Disposition: A | Discharge status: Home / Self Care | Payer: Medicare Other | Source: Ambulatory Visit | Attending: General Surgery | Admitting: General Surgery

## 2019-11-24 DIAGNOSIS — R221 Localized swelling, mass and lump, neck: Secondary | ICD-10-CM | POA: Diagnosis not present

## 2019-11-24 DIAGNOSIS — F411 Generalized anxiety disorder: Secondary | ICD-10-CM | POA: Diagnosis not present

## 2019-11-24 DIAGNOSIS — R222 Localized swelling, mass and lump, trunk: Secondary | ICD-10-CM

## 2019-11-30 ENCOUNTER — Telehealth: Payer: Self-pay | Admitting: General Surgery

## 2019-11-30 NOTE — Telephone Encounter (Signed)
Discussed imaging with patient.   Will order right breast dx mammogram and ultrasound to see if she needs biopsy.  I am not optimistic that she will have enough tissue for a mammogram, but they will see.

## 2019-11-30 NOTE — Progress Notes (Signed)
Gloria Ware was seen today in follow up for tremor.  I saw her back in 2019 for the same.  At that point in time, much of her tremor was stress/anxiety induced.  Pt s tates that her tremor got better in the arms with the course of time.  She was referred for counseling, including those leading back to the referral today.    On September 22, 2019 she saw oncology.  She was given a prescription for propranolol, 10 mg twice per day for tremor.  Medical records from that date indicate that the patient had severe anxiety and they referred her for counseling.  Tremor increased when she was started back on anastrozole.   Today, pt states that tremor got worse since the pandemic started with the isolation and with the "politics."  She does state that the propranolol has helped.  She has also noted that when she focuses on relaxation techniques, she can "read my body" it will help.  Tremor is most often in the R leg - "I don't know if its a tremor or its the shakes."  It is at rest.  She states that the R leg "shakes" and the L leg "tremors" but that they are different.  She also c/o balance issues.  She states that this has been an issue since chemotherapy.  She has had one fall and that was in December.  She was going to get something out of her desk and she lost balance and she ended up with her rocking chair on top of her.  She fractured her L wrist and had surgery the following day.  She states that her walking was getting better and she was picking up her feet really well and then with the stress of the pandemic and the election, she started to shuffle again and balance got worse.  She is going to counseling for her anxiety/stress.  She is doing it virtually.    ALLERGIES:   Allergies  Allergen Reactions  . Penicillins Other (See Comments)    Throat burning Has patient had a PCN reaction causing immediate rash, facial/tongue/throat swelling, SOB or lightheadedness with hypotension: No Has patient  had a PCN reaction causing severe rash involving mucus membranes or skin necrosis: No Has patient had a PCN reaction that required hospitalization: No Has patient had a PCN reaction occurring within the last 10 years: No If all of the above answers are "NO", then may proceed with Cephalosporin use.   . Sulfa Antibiotics Nausea And Vomiting  . Atorvastatin      Muscle aches   . Zetia [Ezetimibe] Other (See Comments)    Muscle aches     CURRENT MEDICATIONS:  Outpatient Encounter Medications as of 12/01/2019  Medication Sig  . acetaminophen (TYLENOL) 500 MG tablet Take 500 mg by mouth daily as needed for moderate pain or headache.  . anastrozole (ARIMIDEX) 1 MG tablet Take 0.5 tablets (0.5 mg total) by mouth daily.  Marland Kitchen aspirin 81 MG tablet Take 81 mg by mouth daily.    . Calcium Carbonate-Vitamin D (CALTRATE 600+D PO) Take 1 tablet by mouth 2 (two) times daily.  Marland Kitchen docusate sodium (COLACE) 100 MG capsule Take 100 mg by mouth at bedtime.  . feeding supplement (BOOST HIGH PROTEIN) LIQD Take by mouth every morning.  . propranolol (INDERAL) 10 MG tablet Take 1 tablet (10 mg total) by mouth 2 (two) times daily.  . sertraline (ZOLOFT) 100 MG tablet Take 200 mg by mouth daily.   Marland Kitchen  MELATONIN PO Take by mouth.   No facility-administered encounter medications on file as of 12/01/2019.    PHYSICAL EXAMINATION:    VITALS:   Vitals:   12/01/19 0853  BP: 105/62  Pulse: 82  SpO2: 98%  Weight: 135 lb (61.2 kg)  Height: 5' (1.524 m)    GEN:  The patient appears stated age and is in NAD.  She is anxious.   HEENT:  Normocephalic, atraumatic.  The mucous membranes are moist. The superficial temporal arteries are without ropiness or tenderness. CV:  RRR Lungs:  CTAB Neck/HEME:  There are no carotid bruits bilaterally.  Neurological examination:  Orientation: The patient is alert and oriented x3. Cranial nerves: There is good facial symmetry.  There is facial hypomimia.  The speech is fluent and  clear. Soft palate rises symmetrically and there is no tongue deviation. Hearing is intact to conversational tone. Sensation: Sensation is intact to light touch throughout Motor: Strength is at least antigravity x4.  Movement examination: Tone: She has significant trouble relaxing to assess for tone, but there appears to be some increased tone in the right leg.  She also holds the arms out rather than resting them on the legs.  Once she is good and relaxed, there appears to be normal tone in the upper extremities. Abnormal movements: there is bilateral LE rest tremor, R>L.  There is R hand tremor (rare) with ambulation.  Once I told her the diagnosis, she began to have some left hand tremor. Coordination:  There is decremation with RAM's, with any form of RAMS, including alternating supination and pronation of the forearm, hand opening and closing, finger taps, heel taps and toe taps bilaterally Gait and Station: The patient is unable to arise without using her hands.  She is short stepped.  She drags the R leg. She has rare R hand tremor with ambulation  I have reviewed and interpreted the following labs independently No results found for: TSH   Chemistry      Component Value Date/Time   NA 138 01/14/2018 1430   NA 138 09/30/2017 1242   K 4.2 01/14/2018 1430   K 4.0 09/30/2017 1242   CL 103 01/14/2018 1430   CO2 27 01/14/2018 1430   CO2 23 09/30/2017 1242   BUN 13 01/14/2018 1430   BUN 14.4 09/30/2017 1242   CREATININE 0.86 01/14/2018 1430   CREATININE 1.0 09/30/2017 1242      Component Value Date/Time   CALCIUM 9.9 01/14/2018 1430   CALCIUM 8.9 09/30/2017 1242   ALKPHOS 181 (H) 01/14/2018 1430   ALKPHOS 136 09/30/2017 1242   AST 30 01/14/2018 1430   AST 12 09/30/2017 1242   ALT 65 (H) 01/14/2018 1430   ALT 13 09/30/2017 1242   BILITOT 0.2 01/14/2018 1430   BILITOT <0.22 09/30/2017 1242        ASSESSMENT/PLAN:  1.  Tremor  -Her examination really has changed since  February, 2019.  while I agree that anxiety is worsening this, she looks much more parkinsonian today than previously and I think that she probably has Parkinsons Disease.  She meets the [place] brain bank criteria.  She and I did discuss this in detail.  Not surprisingly, she is very nervous and worried about this. Because of her anxiety, however, I decided to go ahead and do a DaT scan, but I absolutely expect to see loss of dopamine.  I am not going to stop her sertraline, given her mood issues, even though potentially this  can interfere with DAT scan.  Patient asked me many questions and I answered those to the best of my ability.  Decided to hold off on any medication until we get her scan completed.  -I called her primary care physician office.  She has not had lab work in about a year.  We will go ahead and do that, including chemistry, TSH.  -Patient was started on propranolol by her oncologist and she does think that that has been helpful (likely is helping her tremor, but also probably helping her anxiety some).  She is on the on 10 mg twice per day and I did not change that today.  2.  Generalized anxiety disorder  -Patient is in counseling.  I do think that the patient needs to be under the care of a psychiatrist if she is not already.  She did meet my Metallurgist today.  She likely will need adjustment counseling for the new diagnosis, and we can talk about that more next visit.  3.  Breast cancer history  -Diagnosed in 2018, status post right mastectomy.  Patient remains very anxious about that diagnosis.  Patient follows with oncology.  4.  F/u after above completed.  Total time spent on today's visit was 45 minutes, including both face-to-face time and nonface-to-face time.  Time included that spent on review of records (prior notes available to me/labs/imaging if pertinent), discussing treatment and goals, answering patient's questions and coordinating care.  Cc:  [name],  MD

## 2019-12-01 ENCOUNTER — Other Ambulatory Visit: Payer: Medicare Other

## 2019-12-01 ENCOUNTER — Ambulatory Visit (INDEPENDENT_AMBULATORY_CARE_PROVIDER_SITE_OTHER): Payer: Medicare Other | Admitting: Neurology

## 2019-12-01 ENCOUNTER — Ambulatory Visit (INDEPENDENT_AMBULATORY_CARE_PROVIDER_SITE_OTHER): Payer: Medicare Other | Admitting: Clinical

## 2019-12-01 ENCOUNTER — Encounter: Payer: Self-pay | Admitting: Neurology

## 2019-12-01 ENCOUNTER — Other Ambulatory Visit: Payer: Self-pay

## 2019-12-01 VITALS — BP 105/62 | HR 82 | Ht 60.0 in | Wt 135.0 lb

## 2019-12-01 DIAGNOSIS — Z5181 Encounter for therapeutic drug level monitoring: Secondary | ICD-10-CM | POA: Diagnosis not present

## 2019-12-01 DIAGNOSIS — F411 Generalized anxiety disorder: Secondary | ICD-10-CM | POA: Diagnosis not present

## 2019-12-01 DIAGNOSIS — G20 Parkinson's disease: Secondary | ICD-10-CM

## 2019-12-01 DIAGNOSIS — R251 Tremor, unspecified: Secondary | ICD-10-CM | POA: Diagnosis not present

## 2019-12-01 NOTE — Patient Instructions (Addendum)
We will schedule you for a DaT scan.  Your provider has requested that you have labwork completed today. Please go to [facility] ([address]) on the second floor of this building before leaving the office today. You do not need to check in. If you are not called within 15 minutes please check with the front desk.    The physicians and staff at [name] are committed to providing excellent care. You may receive a survey requesting feedback about your experience at our office. We strive to receive "very good" responses to the survey questions. If you feel that your experience would prevent you from giving the office a "very good " response, please contact our office to try to remedy the situation. We may be reached at [phone]. Thank you for taking the time out of your busy day to complete the survey.

## 2019-12-01 NOTE — BH Specialist Note (Signed)
Brief encounter to introduce myself and my role as part of care team, will f/u with pt at next OV

## 2019-12-02 ENCOUNTER — Other Ambulatory Visit: Payer: Self-pay | Admitting: General Surgery

## 2019-12-02 DIAGNOSIS — N6310 Unspecified lump in the right breast, unspecified quadrant: Secondary | ICD-10-CM

## 2019-12-02 LAB — COMPREHENSIVE METABOLIC PANEL
AG Ratio: 1.7 (calc) (ref 1.0–2.5)
ALT: 24 U/L (ref 6–29)
AST: 21 U/L (ref 10–35)
Albumin: 4.5 g/dL (ref 3.6–5.1)
Alkaline phosphatase (APISO): 83 U/L (ref 37–153)
BUN: 21 mg/dL (ref 7–25)
CO2: 28 mmol/L (ref 20–32)
Calcium: 10.1 mg/dL (ref 8.6–10.4)
Chloride: 101 mmol/L (ref 98–110)
Creat: 0.86 mg/dL (ref 0.60–0.93)
Globulin: 2.7 g/dL (calc) (ref 1.9–3.7)
Glucose, Bld: 94 mg/dL (ref 65–99)
Potassium: 4.2 mmol/L (ref 3.5–5.3)
Sodium: 137 mmol/L (ref 135–146)
Total Bilirubin: 0.5 mg/dL (ref 0.2–1.2)
Total Protein: 7.2 g/dL (ref 6.1–8.1)

## 2019-12-02 LAB — TSH: TSH: 2.54 mIU/L (ref 0.40–4.50)

## 2019-12-03 DIAGNOSIS — F411 Generalized anxiety disorder: Secondary | ICD-10-CM | POA: Diagnosis not present

## 2019-12-08 DIAGNOSIS — Z961 Presence of intraocular lens: Secondary | ICD-10-CM | POA: Diagnosis not present

## 2019-12-08 DIAGNOSIS — H524 Presbyopia: Secondary | ICD-10-CM | POA: Diagnosis not present

## 2019-12-08 DIAGNOSIS — H26493 Other secondary cataract, bilateral: Secondary | ICD-10-CM | POA: Diagnosis not present

## 2019-12-15 ENCOUNTER — Other Ambulatory Visit: Payer: Self-pay | Admitting: Hematology and Oncology

## 2019-12-16 ENCOUNTER — Ambulatory Visit
Admission: RE | Admit: 2019-12-16 | Discharge: 2019-12-16 | Disposition: A | Discharge status: Home / Self Care | Payer: Medicare Other | Source: Ambulatory Visit | Attending: General Surgery | Admitting: General Surgery

## 2019-12-16 ENCOUNTER — Other Ambulatory Visit: Payer: Self-pay

## 2019-12-16 ENCOUNTER — Other Ambulatory Visit: Payer: Self-pay | Admitting: General Surgery

## 2019-12-16 ENCOUNTER — Ambulatory Visit: Admission: RE | Admit: 2019-12-16 | Payer: Medicare Other | Source: Ambulatory Visit

## 2019-12-16 DIAGNOSIS — R928 Other abnormal and inconclusive findings on diagnostic imaging of breast: Secondary | ICD-10-CM | POA: Diagnosis not present

## 2019-12-16 DIAGNOSIS — N6310 Unspecified lump in the right breast, unspecified quadrant: Secondary | ICD-10-CM

## 2019-12-16 DIAGNOSIS — Z853 Personal history of malignant neoplasm of breast: Secondary | ICD-10-CM | POA: Diagnosis not present

## 2019-12-17 DIAGNOSIS — F411 Generalized anxiety disorder: Secondary | ICD-10-CM | POA: Diagnosis not present

## 2019-12-18 ENCOUNTER — Telehealth: Payer: Self-pay | Admitting: Neurology

## 2019-12-18 DIAGNOSIS — C50211 Malignant neoplasm of upper-inner quadrant of right female breast: Secondary | ICD-10-CM | POA: Diagnosis not present

## 2019-12-18 NOTE — Telephone Encounter (Signed)
Left detailed message informing patient to take medications as directed no preparation needed for DaT scan.

## 2019-12-18 NOTE — Telephone Encounter (Signed)
See last note, which answers this question.  Continue meds as is for DaT scan.  No preparation for DaT

## 2019-12-18 NOTE — Telephone Encounter (Signed)
Pt needs to know how to prepare for her DaT scan she is having done. She needs to know if she  Is suppose to take medication that day or not please call

## 2019-12-23 DIAGNOSIS — F411 Generalized anxiety disorder: Secondary | ICD-10-CM | POA: Diagnosis not present

## 2019-12-24 ENCOUNTER — Other Ambulatory Visit: Payer: Self-pay

## 2019-12-24 ENCOUNTER — Encounter (HOSPITAL_COMMUNITY)
Admission: RE | Admit: 2019-12-24 | Discharge: 2019-12-24 | Disposition: A | Discharge status: Home / Self Care | Payer: Medicare Other | Source: Ambulatory Visit | Attending: Neurology | Admitting: Neurology

## 2019-12-24 ENCOUNTER — Ambulatory Visit (HOSPITAL_COMMUNITY)
Admission: RE | Admit: 2019-12-24 | Discharge: 2019-12-24 | Disposition: A | Discharge status: Home / Self Care | Payer: Medicare Other | Source: Ambulatory Visit | Attending: Neurology | Admitting: Neurology

## 2019-12-24 DIAGNOSIS — G20 Parkinson's disease: Secondary | ICD-10-CM | POA: Diagnosis not present

## 2019-12-24 DIAGNOSIS — R251 Tremor, unspecified: Secondary | ICD-10-CM | POA: Diagnosis not present

## 2019-12-24 DIAGNOSIS — Z5181 Encounter for therapeutic drug level monitoring: Secondary | ICD-10-CM | POA: Diagnosis not present

## 2019-12-24 MED ORDER — IOFLUPANE I 123 185 MBQ/2.5ML IV SOLN
4.9800 | Freq: Once | INTRAVENOUS | Status: AC | PRN
  Administered 2019-12-24: 4.98 via INTRAVENOUS
  Filled 2019-12-24: qty 5

## 2019-12-24 MED ORDER — IODINE STRONG (LUGOLS) 5 % PO SOLN
0.8000 mL | Freq: Once | ORAL | Status: AC
  Administered 2019-12-24: 0.8 mL via ORAL

## 2019-12-24 MED ORDER — IODINE STRONG (LUGOLS) 5 % PO SOLN
ORAL | Status: AC
  Filled 2019-12-24: qty 1

## 2019-12-28 ENCOUNTER — Telehealth: Payer: Self-pay | Admitting: Neurology

## 2019-12-28 NOTE — Telephone Encounter (Signed)
Patient called requesting a referral for physical therapy.

## 2019-12-28 NOTE — Progress Notes (Signed)
Due to the COVID-19 crisis, this virtual check-in visit was done via telephone from my office and it was initiated and consent given by this patient and or family.  Telephone (Audio) Visit The purpose of this telephone visit is to provide medical care while limiting exposure to the novel coronavirus.    Consent was obtained for telephone visit and initiated by pt/family:  Yes.   Answered questions that patient had about telehealth interaction:  Yes.   I discussed the limitations, risks, security and privacy concerns of performing an evaluation and management service by telephone. I also discussed with the patient that there may be a patient responsible charge related to this service. The patient expressed understanding and agreed to proceed.  Pt location: Home Physician Location: home Name of referring provider:  [name], MD I connected with .Illa Level at patients initiation/request on 12/30/2019 at  8:45 AM EDT by telephone and verified that I am speaking with the correct person using two identifiers.  Pt MRN:  [id number] Pt DOB:  [birth year]  Participants:  Illa Level;  Husband supplements hx  Assessment/Plan:   1.  Parkinsons Disease  -Discussed nature and pathophysiology.  This is a new diagnosis for her (or was last visit).  DaTscan likewise showed loss of dopamine.  -She asked me about genetic testing.  Her father had a history of Parkinson's disease.  We discussed [facility] genetic testing.  She will think about that and let me know next visit if she would like to proceed.  -She and I talked about medications, specifically levodopa.  Ultimately, we decided to hold off on more detailed discussions until her in person appointment in 2 weeks.  -We will refer the patient for physical therapy.  She was given my direct phone number to physical therapy to call if she does not hear from them.  -For now, I did not change her propranolol, 10 mg twice per day.  This was started  before she came back to see me for tremor, but the patient thinks it is helpful.  It is likely helping some of her anxiety as well.  -Discussed community exercise programs.  2.  GAD  -Patient is in counseling.    -We will set her up for adjustment to dx counseling for Parkinsons Disease.  She has an appointment with Judson Roch on April 15.  -I do think the patient needs a psychiatrist.  We talked about this last visit as well.  After talking with her, she was agreeable.  We will send a referral to Dr. Clovis Pu.  3.  Breast cancer history  -Following with oncology.   Need for in person visit now: Patient has an in person appointment on April 15.  She will keep that  Subjective:  Patient seen today in order to go over her DaTscan results.  I told the patient last visit that I thought she met the criteria for the diagnosis of Parkinson's disease.  She was very nervous and anxious about that, so we decided to go ahead and do a DaTscan.  DaTscan was done on March 18.  I personally reviewed those films.  There was loss of dopamine in the left and right putamen.  Current Movement d/o meds: Propranolol, 10 mg twice per day   Objective:   There were no vitals filed for this visit. Patient is alert and oriented x3.  Her speech is fluent and clear.  Follow Up Instructions:      -I discussed the assessment  and treatment plan with the patient. The patient was provided an opportunity to ask questions and all were answered. The patient agreed with the plan and demonstrated an understanding of the instructions.   The patient was advised to call back or seek an in-person evaluation if the symptoms worsen or if the condition fails to improve as anticipated.    Total Time spent in visit with the patient was:  25 min, of which 100% of the time was spent in counseling as above.   Pt understands and agrees with the plan of care outlined.     Alonza Bogus, DO

## 2019-12-28 NOTE — Telephone Encounter (Signed)
Pt is sch for the 24th of March for a telephone visit

## 2019-12-28 NOTE — Telephone Encounter (Signed)
Can we put her in Wed to f/u DaT scan instead of next month.  That way her insurance will pay for the PT

## 2019-12-28 NOTE — Telephone Encounter (Signed)
Is this ok?

## 2019-12-29 ENCOUNTER — Encounter: Payer: Self-pay | Admitting: Neurology

## 2019-12-30 ENCOUNTER — Other Ambulatory Visit: Payer: Self-pay

## 2019-12-30 ENCOUNTER — Telehealth (INDEPENDENT_AMBULATORY_CARE_PROVIDER_SITE_OTHER): Payer: Medicare Other | Admitting: Neurology

## 2019-12-30 VITALS — Ht 61.0 in | Wt 131.0 lb

## 2019-12-30 DIAGNOSIS — F411 Generalized anxiety disorder: Secondary | ICD-10-CM

## 2019-12-30 DIAGNOSIS — Z0000 Encounter for general adult medical examination without abnormal findings: Secondary | ICD-10-CM | POA: Diagnosis not present

## 2019-12-30 DIAGNOSIS — M8588 Other specified disorders of bone density and structure, other site: Secondary | ICD-10-CM | POA: Diagnosis not present

## 2019-12-30 DIAGNOSIS — C773 Secondary and unspecified malignant neoplasm of axilla and upper limb lymph nodes: Secondary | ICD-10-CM | POA: Diagnosis not present

## 2019-12-30 DIAGNOSIS — C50919 Malignant neoplasm of unspecified site of unspecified female breast: Secondary | ICD-10-CM | POA: Diagnosis not present

## 2019-12-30 DIAGNOSIS — F419 Anxiety disorder, unspecified: Secondary | ICD-10-CM | POA: Diagnosis not present

## 2019-12-30 DIAGNOSIS — F324 Major depressive disorder, single episode, in partial remission: Secondary | ICD-10-CM | POA: Diagnosis not present

## 2019-12-30 DIAGNOSIS — E7800 Pure hypercholesterolemia, unspecified: Secondary | ICD-10-CM | POA: Diagnosis not present

## 2019-12-30 DIAGNOSIS — G20 Parkinson's disease: Secondary | ICD-10-CM | POA: Diagnosis not present

## 2019-12-30 DIAGNOSIS — G20A1 Parkinson's disease without dyskinesia, without mention of fluctuations: Secondary | ICD-10-CM | POA: Insufficient documentation

## 2019-12-30 NOTE — Patient Instructions (Addendum)
You have been referred to [facility] for therapy. They will call you directly to schedule an appointment.  Please call [phone] if you do not hear from them.   You will be referred to Dr. Clovis Pu.  His phone number is:  [phone]

## 2020-01-05 DIAGNOSIS — F411 Generalized anxiety disorder: Secondary | ICD-10-CM | POA: Diagnosis not present

## 2020-01-12 ENCOUNTER — Other Ambulatory Visit: Payer: Self-pay

## 2020-01-12 ENCOUNTER — Ambulatory Visit: Payer: Medicare Other | Attending: Neurology | Admitting: Physical Therapy

## 2020-01-12 DIAGNOSIS — R293 Abnormal posture: Secondary | ICD-10-CM

## 2020-01-12 DIAGNOSIS — R29818 Other symptoms and signs involving the nervous system: Secondary | ICD-10-CM | POA: Diagnosis not present

## 2020-01-12 DIAGNOSIS — R2681 Unsteadiness on feet: Secondary | ICD-10-CM

## 2020-01-12 DIAGNOSIS — M6281 Muscle weakness (generalized): Secondary | ICD-10-CM | POA: Insufficient documentation

## 2020-01-12 DIAGNOSIS — R2689 Other abnormalities of gait and mobility: Secondary | ICD-10-CM | POA: Diagnosis not present

## 2020-01-12 DIAGNOSIS — F411 Generalized anxiety disorder: Secondary | ICD-10-CM | POA: Diagnosis not present

## 2020-01-12 NOTE — Therapy (Signed)
[facility] [address] [place], [place], [address] Phone: [phone]   Fax:  [phone]  Physical Therapy Evaluation  Patient Details  Name: Gloria Ware MRN: [id number] Date of Birth: 01/16/1946 (74 years old) Referring Provider (PT): Alonza Bogus, DO   Encounter Date: 01/12/2020  PT End of Session - 01/12/20 1456    Visit Number  1    Number of Visits  18    Date for PT Re-Evaluation  99991111   90 day cert for 9 wk POC   Authorization Type  Medicare    Progress Note Due on Visit  10    PT Start Time  1232    PT Stop Time  1324    PT Time Calculation (min)  52 min    Activity Tolerance  Patient tolerated treatment well    Behavior During Therapy  [facility] for tasks assessed/performed       Past Medical History:  Diagnosis Date  . Anxiety   . Breast CA ([place])   . Colon polyp 09/12/2005  . Depression   . Eczema    right forearm   . Genetic testing 06/27/2017   Multi-Cancer panel (83 genes) @ [facility] - No pathogenic mutations detected  . Hearing aid worn    B/L  . Hearing loss   . High cholesterol   . Injury     left foot metatarsal fracture sustined 08-03-17 , treated with boot; patient  reports  improvement as of today  . OCD (obsessive compulsive disorder)   . Osteopenia 05/2019   T score -2.0 FRAX 32% / 16%  . Personal history of chemotherapy   . Personal history of radiation therapy   . PONV (postoperative nausea and vomiting)    post op nausea; took a long time waking up  . Tinnitus     Past Surgical History:  Procedure Laterality Date  . BREAST SURGERY     Mastectomy-Right  . CARDIAC CATHETERIZATION     husband was having symtpoms and needed a stent; subsequently she states she started " to feel something"  but thinks it was " all in my head bc of my hbsand". subsequently saw same cardiologist as her husband who sent for treadmill stress that had inconcluisicve results so she was sent for cardiac  catherization which she states was " definitely negative "   . cataract surg    . COLONOSCOPY     with polypectomy  . DILATION AND CURETTAGE OF UTERUS    . IR CV LINE INJECTION  09/30/2017  . MASTECTOMY Right   . MASTECTOMY W/ SENTINEL NODE BIOPSY Right 08/06/2017   Procedure: RIGHT MASTECTOMY WITH SENTINEL LYMPH NODE BIOPSY;  Surgeon: [name], MD;  Location: [place];  Service: General;  Laterality: Right;  . MOUTH SURGERY    . PORT-A-CATH REMOVAL Left 01/20/2018   Procedure: REMOVAL PORT-A-CATH;  Surgeon: [name], MD;  Location: [place];  Service: General;  Laterality: Left;  . PORTACATH PLACEMENT N/A 09/12/2017   Procedure: INSERTION PORT-A-CATH;  Surgeon: [name], MD;  Location: [facility];  Service: General;  Laterality: N/A;  . RADIOACTIVE SEED GUIDED AXILLARY SENTINEL LYMPH NODE Right 08/06/2017   Procedure: SEED TARGETED AXILLARY LYMPH NODE EXCISION;  Surgeon: [name], MD;  Location: [place] [facility];  Service: General;  Laterality: Right;    There were no vitals filed for this visit.   Subjective Assessment - 01/12/20 1236    Subjective  Pt reports she has  recent diagnosis of Parkinson's disease, February 2021.  She has hx of breast cancer 3 years ago, with very bad reaction to chemo and had to stop.  After completion of treatment (and likely before her breast ca dx), she notes a lot of stiffness and difficulty with getting up and down.  She notes tremors in her legs, R>L.  She uses tripod base cane, but admits she is unsure how to use.  Pt has had one fall in December, leaning over to get something off a desk and then reached and fell.  Pt has some freezing episodes with turning.    Pertinent History  PMH includes breast cancer, anxiety, depression, L foot fracture, L wrist fracture from fall in December; OCD, osteopenia    Patient Stated Goals  Pt's goal for therapy is to get onto and out of a chair without arms, getting in and out of the  car, walking comfortably and turning.    Currently in Pain?  Yes    Pain Score  6     Pain Location  Leg    Pain Orientation  Right;Left    Pain Descriptors / Indicators  Dull   stiffness   Pain Onset  More than a month ago    Pain Frequency  Intermittent    Aggravating Factors   standing too long, being in one place too long    Pain Relieving Factors  relaxing, calming down    Effect of Pain on Daily Activities  PT will monitor pain, but will not address as a goal at this time.         [facility] PT Assessment - 01/12/20 1245      Assessment   Medical Diagnosis  Parkinson's disease    Referring Provider (PT)  Alonza Bogus, DO    Onset Date/Surgical Date  12/01/19   MD visit, Parkinson's diagnosis   Hand Dominance  Right    Next MD Visit  01/21/2020      Precautions   Precautions  Fall    Precaution Comments  hx of recent L wrist fracture, healed; no restrictions per pt report      Balance Screen   Has the patient fallen in the past 6 months  Yes    How many times?  1    Has the patient had a decrease in activity level because of a fear of falling?   Yes    Is the patient reluctant to leave their home because of a fear of falling?   Yes      [place]  Private residence    Living Arrangements  Spouse/significant other    Available Help at Discharge  Family    Type of [place] Access  Level entry    [place] - single point;Tub bench      Prior Function   Level of Independence  Independent with basic ADLs;Independent with household mobility without device    Leisure  Enjoys traveling, plays in the symphony Counsellor)      Observation/Other Assessments   Focus on Therapeutic Outcomes (FOTO)   NA      Posture/Postural Control   Posture/Postural Control  Postural limitations    Postural Limitations  Rounded Shoulders      ROM / Strength   AROM / PROM / Strength  Strength      Strength   Overall  Strength  Deficits    Strength Assessment Site  Hip;Knee;Ankle    Right/Left Hip  Right;Left    Right Hip Flexion  4/5    Left Hip Flexion  4/5    Right/Left Knee  Right;Left    Right Knee Flexion  4/5    Right Knee Extension  3+/5    Left Knee Flexion  4+/5    Left Knee Extension  4/5    Right/Left Ankle  Right;Left    Right Ankle Dorsiflexion  4/5    Left Ankle Dorsiflexion  4/5      Transfers   Transfers  Sit to Stand;Stand to Sit    Sit to Stand  6: Modified independent (Device/Increase time);With upper extremity assist;From chair/3-in-1    Five time sit to stand comments   15.28   with UE support   Stand to Sit  6: Modified independent (Device/Increase time);With upper extremity assist;To chair/3-in-1    Comments  Feet appear to be placed posteriorly and pt almost uses backs of legs to push at chair.      Ambulation/Gait   Ambulation/Gait  Yes    Ambulation/Gait Assistance  5: Supervision;4: Min guard    Ambulation/Gait Assistance Details  At times, pt carries cane    Ambulation Distance (Feet)  200 Feet    Assistive device  Straight cane   with tripod base   Gait Pattern  Step-through pattern;Decreased arm swing - left;Decreased step length - right;Decreased step length - left;Shuffle;Festinating;Narrow base of support    Ambulation Surface  Level;Indoor    Gait velocity  17.47 sec= 1.88 ft/sec    Stairs  Yes    Stairs Assistance  5: Supervision    Stairs Assistance Details (indicate cue type and reason)  Decreased foot clearance with descending steps    Stair Management Technique  Two rails;Alternating pattern;Forwards    Number of Stairs  4    Height of Stairs  6    Gait Comments  Pt noted to have festinating with turns at times      Standardized Balance Assessment   Standardized Balance Assessment  Timed Up and Go Test;Dynamic Gait Index      Dynamic Gait Index   Level Surface  Moderate Impairment    Change in Gait Speed  Mild Impairment    Gait with Horizontal  Head Turns  Mild Impairment    Gait with Vertical Head Turns  Moderate Impairment    Gait and Pivot Turn  Mild Impairment   3.16 sec   Step Over Obstacle  Moderate Impairment    Step Around Obstacles  Mild Impairment    Steps  Mild Impairment    Total Score  13    DGI comment:  Scores <19/24 indicate increased fall risk.      Timed Up and Go Test   TUG  Normal TUG;Cognitive TUG    Normal TUG (seconds)  24.5    Cognitive TUG (seconds)  26.41    TUG Comments  Scores >13.5-15 seconds indicate increased fall risk.  Pt does not use cane with TUG.      High Level Balance   High Level Balance Comments  360 turns:  to R:  12.9 seconds; to L:  13.84 sec                Objective measurements completed on examination: See above findings.              PT Education - 01/12/20 1455    Education Details  Results of eval; purpose of larger amplitude, PD-specific exercises to help with functional mobility; tips on how to use cane properly, and instruction on optimal sit<>stand technique    Person(s) Educated  Patient    Methods  Explanation;Demonstration    Comprehension  Verbalized understanding;Returned demonstration;Need further instruction;Verbal cues required       PT Short Term Goals - 01/12/20 1553      PT SHORT TERM GOAL #1   Title  Pt will be independent with HEP for improved transfers, strength, balance, and gait.  TARGET 03/14/2020 for all STGs    Time  5    Period  Weeks    Status  New      PT SHORT TERM GOAL #2   Title  Pt will improve 5x sit<>stand to less than or equal to 13 seconds (with UE support), for improved transfer efficiency and lower extremity strength.    Baseline  15.28 sec with UE support    Time  5    Period  Weeks      PT SHORT TERM GOAL #3   Title  Pt will improve TUG score to less than or equal to 18 seconds for decreased fall risk.    Baseline  24.5 sec    Time  5    Period  Weeks      PT SHORT TERM GOAL #4   Title  Pt will improve  DGI score to at least 16/24 for decreased fall risk.    Baseline  13/24    Time  5    Period  Weeks    Status  New      PT SHORT TERM GOAL #5   Title  Pt will report at least 50% improvement in car transfers, for improved overall functional mobility.    Time  5    Period  Weeks    Status  New      Additional Short Term Goals   Additional Short Term Goals  Yes      PT SHORT TERM GOAL #6   Title  Pt will verbalize understanding of fall prevention in home environment, including tips to reducing freezing with gait.    Time  5    Period  Weeks    Status  New        PT Long Term Goals - 01/12/20 1556      PT LONG TERM GOAL #1   Title  Pt will be independent with progression of Parkinson's specific exercise program to improve overall functional mobility.  TARGET 9 weeks: 03/11/2020 for all LTGs    Time  9    Period  Weeks    Status  New      PT LONG TERM GOAL #2   Title  Pt will perform at least 4 of 5 reps of sit<>stand from 18" chairs or lower, with minimal to no UE support, no LOB, for improved transfer efficiency and safety.    Time  9    Period  Weeks    Status  New      PT LONG TERM GOAL #3   Title  Pt will improve TUG score to less than or equal to 13.5 seconds for decreased fall risk.    Baseline  24.5 sec at eval    Time  9    Period  Weeks    Status  New      PT LONG TERM GOAL #4   Title  Pt will improve DGI score  to at least 19/24 for decreased fall risk.    Baseline  13/24 at eval    Time  9    Period  Weeks    Status  New      PT LONG TERM GOAL #5   Title  Pt will improve gait velocity to at least 2.3 ft/sec for improved gait efficiency and safety.    Baseline  1.88 ft/sec at eval    Time  9    Period  Weeks    Status  New             Plan - 01/12/20 1544    Clinical Impression Statement  Pt is a 74 year old female who presents to OPPT status post recent dx of Parkinson's disease (February 2021).  She presents with tremors, bradykinesia,  rigidity, decreased timing and coordination of gait, festination/freezing noted with turns and at times with initiaition of gait, difficulty with transfers, decreased balance, hx of fall (December 2020).  She is at fall risk per DGI and TUG scores and per gait velocity score.  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  She would benefit from skilled PT to address the above stated deficits to decrease fall risk and improve overall functional mobility.    Personal Factors and Comorbidities  Comorbidity 3+    Comorbidities  PMH includes breast cancer, anxiety, depression, L foot fracture, L wrist fracture from fall in December; OCD, osteopenia    Examination-Activity Limitations  Locomotion Level;Transfers;Stairs;Stand;Other   car transfers   Examination-Participation Restrictions  Community Activity;Other   Community exercise and walking in neighborhood   Stability/Clinical Decision Making  Evolving/Moderate complexity   PD dx in February 2021, not yet started on PD medications   Clinical Decision Making  Moderate    Rehab Potential  Good    PT Frequency  2x / week    PT Duration  Other (comment)   9 weeks, including eval week   PT Treatment/Interventions  ADLs/Self Care Home Management;DME Instruction;Neuromuscular re-education;Balance training;Therapeutic exercise;Therapeutic activities;Functional mobility training;Stair training;Gait training;Patient/family education    PT Next Visit Plan  review and continue to educate in gait training with cane, transfer training; initiate PWR! Moves in sitting, standing as part of HEP; need to look at push and release test    Consulted and Agree with Plan of Care  Patient       Patient will benefit from skilled therapeutic intervention in order to improve the following deficits and impairments:  Abnormal gait, Difficulty walking, Impaired tone, Decreased balance, Decreased mobility, Decreased  strength, Postural dysfunction  Visit Diagnosis: Other abnormalities of gait and mobility  Unsteadiness on feet  Muscle weakness (generalized)  Abnormal posture  Other symptoms and signs involving the nervous system     Problem List Patient Active Problem List   Diagnosis Date Noted  . Parkinson's disease ([place]) 12/30/2019  . Insomnia 10/07/2017  . Dehydration 10/07/2017  . Port-A-Cath in place 09/30/2017  . Genetic testing 06/27/2017  . Family history of breast cancer   . Malignant neoplasm of upper-inner quadrant of right breast in female, estrogen receptor positive ([place]) 06/26/2017    Alleigh Mollica W. 01/12/2020, 4:00 PM Frazier Butt., PT  [facility] [address] [place], [place], [address] Phone: [phone]   Fax:  [phone]  Name: EMNET DEVLIN MRN: [id number] Date of Birth: 03/08/1946

## 2020-01-12 NOTE — Addendum Note (Signed)
Addended by: Frazier Butt on: 01/12/2020 04:03 PM   Modules accepted: Orders

## 2020-01-15 ENCOUNTER — Encounter: Payer: Self-pay | Admitting: Physical Therapy

## 2020-01-15 ENCOUNTER — Ambulatory Visit: Payer: Medicare Other | Admitting: Physical Therapy

## 2020-01-15 ENCOUNTER — Other Ambulatory Visit: Payer: Self-pay

## 2020-01-15 DIAGNOSIS — R293 Abnormal posture: Secondary | ICD-10-CM | POA: Diagnosis not present

## 2020-01-15 DIAGNOSIS — R2689 Other abnormalities of gait and mobility: Secondary | ICD-10-CM | POA: Diagnosis not present

## 2020-01-15 DIAGNOSIS — R2681 Unsteadiness on feet: Secondary | ICD-10-CM

## 2020-01-15 DIAGNOSIS — M6281 Muscle weakness (generalized): Secondary | ICD-10-CM | POA: Diagnosis not present

## 2020-01-15 DIAGNOSIS — R29818 Other symptoms and signs involving the nervous system: Secondary | ICD-10-CM

## 2020-01-15 NOTE — Patient Instructions (Addendum)
Sit to Stand Transfers:  1. Scoot out to the edge of the chair  (You can do this by ROCKING side to side through your hips, then ROCK and bring one leg forward at a time, until your feet are on the floor) 2. Place your feet flat on the floor, shoulder width apart.  Make sure your feet are tucked just under your knees. 3. Lean forward (nose over toes) with momentum, (You can ROCK forward and back several times) and then stand up tall with your best posture.  If you need to use your arms, use them as a quick boost up to stand. 4. If you are in a low or soft chair, you can lean back and then forward up to stand, in order to get more momentum. 5. Once you are standing, make sure you are looking ahead and standing tall.  To sit down:  1. Back up until you feel the chair behind your legs. 2. Bend at your hips, reaching  Back for you chair, if needed, then slowly squat to sit down on your chair.

## 2020-01-15 NOTE — Therapy (Signed)
[facility] [address] [place], [place], [address] Phone: [phone]   Fax:  [phone]  Physical Therapy Treatment  Patient Details  Name: Gloria Ware MRN: [id number] Date of Birth: Oct 14, 1945 Referring Provider (PT): Alonza Bogus, DO   Encounter Date: 01/15/2020  PT End of Session - 01/15/20 1448    Visit Number  2    Number of Visits  18    Date for PT Re-Evaluation  99991111   90 day cert for 9 wk POC   Authorization Type  Medicare    Progress Note Due on Visit  10    PT Start Time  1019    PT Stop Time  1100    PT Time Calculation (min)  41 min    Activity Tolerance  Patient tolerated treatment well    Behavior During Therapy  [facility] for tasks assessed/performed       Past Medical History:  Diagnosis Date  . Anxiety   . Breast CA ([place])   . Colon polyp 09/12/2005  . Depression   . Eczema    right forearm   . Genetic testing 06/27/2017   Multi-Cancer panel (83 genes) @ [facility] - No pathogenic mutations detected  . Hearing aid worn    B/L  . Hearing loss   . High cholesterol   . Injury     left foot metatarsal fracture sustined 08-03-17 , treated with boot; patient  reports  improvement as of today  . OCD (obsessive compulsive disorder)   . Osteopenia 05/2019   T score -2.0 FRAX 32% / 16%  . Personal history of chemotherapy   . Personal history of radiation therapy   . PONV (postoperative nausea and vomiting)    post op nausea; took a long time waking up  . Tinnitus     Past Surgical History:  Procedure Laterality Date  . BREAST SURGERY     Mastectomy-Right  . CARDIAC CATHETERIZATION     husband was having symtpoms and needed a stent; subsequently she states she started " to feel something"  but thinks it was " all in my head bc of my hbsand". subsequently saw same cardiologist as her husband who sent for treadmill stress that had inconcluisicve results so she was sent for cardiac  catherization which she states was " definitely negative "   . cataract surg    . COLONOSCOPY     with polypectomy  . DILATION AND CURETTAGE OF UTERUS    . IR CV LINE INJECTION  09/30/2017  . MASTECTOMY Right   . MASTECTOMY W/ SENTINEL NODE BIOPSY Right 08/06/2017   Procedure: RIGHT MASTECTOMY WITH SENTINEL LYMPH NODE BIOPSY;  Surgeon: [name], MD;  Location: [facility];  Service: General;  Laterality: Right;  . MOUTH SURGERY    . PORT-A-CATH REMOVAL Left 01/20/2018   Procedure: REMOVAL PORT-A-CATH;  Surgeon: [name], MD;  Location: [facility];  Service: General;  Laterality: Left;  . PORTACATH PLACEMENT N/A 09/12/2017   Procedure: INSERTION PORT-A-CATH;  Surgeon: [name], MD;  Location: [facility];  Service: General;  Laterality: N/A;  . RADIOACTIVE SEED GUIDED AXILLARY SENTINEL LYMPH NODE Right 08/06/2017   Procedure: SEED TARGETED AXILLARY LYMPH NODE EXCISION;  Surgeon: [name], MD;  Location: [facility];  Service: General;  Laterality: Right;    There were no vitals filed for this visit.  Subjective Assessment - 01/15/20 1022    Subjective  Feel better today, as I  had a better night sleep.    Pertinent History  PMH includes breast cancer, anxiety, depression, L foot fracture, L wrist fracture from fall in December; OCD, osteopenia    Patient Stated Goals  Pt's goal for therapy is to get onto and out of a chair without arms, getting in and out of the car, walking comfortably and turning.    Currently in Pain?  No/denies    Pain Onset  More than a month ago                       [facility] Adult PT Treatment/Exercise - 01/15/20 1025      Transfers   Transfers  Sit to Stand;Stand to Sit    Sit to Stand  With upper extremity assist;From chair/3-in-1;5: Supervision;From bed    Sit to Stand Details  Verbal cues for sequencing;Verbal cues for technique;Visual cues/gestures for sequencing    Stand to Sit  With upper extremity  assist;To chair/3-in-1;5: Supervision;To bed    Transfer Cueing  Began to simulate car transfers, from 16" chair, with pt practicing single leg, then double leg step out and in.  Pt able to verbalize/demonstrate how this technique could be effective with car transfers.    Comments  Cues provided for technique for sit<>stand transfers, including scooting forward and back, initiating with lateral weigthshifting to assist with transfers from her recliner chair at home.  Performed at least 3 reps with scooting technique, then additional 3 reps throughout session.  Pt able to begin to verbalize and return demo understanding.        Ambulation/Gait   Ambulation/Gait  Yes    Ambulation/Gait Assistance  5: Supervision;4: Min guard    Ambulation/Gait Assistance Details  Cues for cane sequence, cues for increased step length; heelstrike.  Pt initially responds well to cues, but reverts back to shuffling steps with distractions.    Ambulation Distance (Feet)  115 Feet   60    Assistive device  Straight cane   tripod base   Gait Pattern  Step-through pattern;Decreased arm swing - left;Decreased step length - right;Decreased step length - left;Shuffle;Festinating;Narrow base of support    Ambulation Surface  Level;Indoor        Neuro Re-education In addition to transfer training above, pt performs seated PWR! Moves:  Pt performs PWR! Moves in seated position    PWR! Up for improved posture x 10 reps  PWR! Rock for improved weighshifting x 5 reps each side  PWR! Step for improved step initiation x 5 reps single step out/in, then 5 reps double step out/in  Verbal, visual Cues provided for technique and intensity.       PT Education - 01/15/20 1447    Education Details  transfer training, PWR! Moves added to [employer]) Educated  Patient    Methods  Explanation;Demonstration;Verbal cues;Handout    Comprehension  Verbalized understanding;Returned demonstration;Verbal cues required;Need  further instruction       PT Short Term Goals - 01/12/20 1553      PT SHORT TERM GOAL #1   Title  Pt will be independent with HEP for improved transfers, strength, balance, and gait.  TARGET 03/14/2020 for all STGs    Time  5    Period  Weeks    Status  New      PT SHORT TERM GOAL #2   Title  Pt will improve 5x sit<>stand to less than or equal to 13 seconds (with UE support),  for improved transfer efficiency and lower extremity strength.    Baseline  15.28 sec with UE support    Time  5    Period  Weeks      PT SHORT TERM GOAL #3   Title  Pt will improve TUG score to less than or equal to 18 seconds for decreased fall risk.    Baseline  24.5 sec    Time  5    Period  Weeks      PT SHORT TERM GOAL #4   Title  Pt will improve DGI score to at least 16/24 for decreased fall risk.    Baseline  13/24    Time  5    Period  Weeks    Status  New      PT SHORT TERM GOAL #5   Title  Pt will report at least 50% improvement in car transfers, for improved overall functional mobility.    Time  5    Period  Weeks    Status  New      Additional Short Term Goals   Additional Short Term Goals  Yes      PT SHORT TERM GOAL #6   Title  Pt will verbalize understanding of fall prevention in home environment, including tips to reducing freezing with gait.    Time  5    Period  Weeks    Status  New        PT Long Term Goals - 01/12/20 1556      PT LONG TERM GOAL #1   Title  Pt will be independent with progression of Parkinson's specific exercise program to improve overall functional mobility.  TARGET 9 weeks: 03/11/2020 for all LTGs    Time  9    Period  Weeks    Status  New      PT LONG TERM GOAL #2   Title  Pt will perform at least 4 of 5 reps of sit<>stand from 18" chairs or lower, with minimal to no UE support, no LOB, for improved transfer efficiency and safety.    Time  9    Period  Weeks    Status  New      PT LONG TERM GOAL #3   Title  Pt will improve TUG score to less than  or equal to 13.5 seconds for decreased fall risk.    Baseline  24.5 sec at eval    Time  9    Period  Weeks    Status  New      PT LONG TERM GOAL #4   Title  Pt will improve DGI score to at least 19/24 for decreased fall risk.    Baseline  13/24 at eval    Time  9    Period  Weeks    Status  New      PT LONG TERM GOAL #5   Title  Pt will improve gait velocity to at least 2.3 ft/sec for improved gait efficiency and safety.    Baseline  1.88 ft/sec at eval    Time  9    Period  Weeks    Status  New            Plan - 01/15/20 1448    Clinical Impression Statement  Initiated HEP today for transfer technique (for sit<>stand transfers and car transfers) as well as seated PWR! Moves.  Also worked on Personnel officer with cane, with pt improving step length and heelstrike  with cues.  She does revert back to shuffling gait with distractions, and will benefit from further gait training practice.  Pt responds well to instructions today and appears to be motivated to use techniques for mobility at home.    Personal Factors and Comorbidities  Comorbidity 3+    Comorbidities  PMH includes breast cancer, anxiety, depression, L foot fracture, L wrist fracture from fall in December; OCD, osteopenia    Examination-Activity Limitations  Locomotion Level;Transfers;Stairs;Stand;Other   car transfers   Examination-Participation Restrictions  Community Activity;Other   Community exercise and walking in neighborhood   Stability/Clinical Decision Making  Evolving/Moderate complexity   PD dx in February 2021, not yet started on PD medications   Rehab Potential  Good    PT Frequency  2x / week    PT Duration  Other (comment)   9 weeks, including eval week   PT Treatment/Interventions  ADLs/Self Care Home Management;DME Instruction;Neuromuscular re-education;Balance training;Therapeutic exercise;Therapeutic activities;Functional mobility training;Stair training;Gait training;Patient/family education    PT  Next Visit Plan  Review transfer training, HEP, how did car transfers and transfers from recliner go?  Perform push and release test; further gait training, standing PWR! Moves (at counter for support)    Consulted and Agree with Plan of Care  Patient       Patient will benefit from skilled therapeutic intervention in order to improve the following deficits and impairments:  Abnormal gait, Difficulty walking, Impaired tone, Decreased balance, Decreased mobility, Decreased strength, Postural dysfunction  Visit Diagnosis: Other symptoms and signs involving the nervous system  Unsteadiness on feet  Other abnormalities of gait and mobility     Problem List Patient Active Problem List   Diagnosis Date Noted  . Parkinson's disease ([place]) 12/30/2019  . Insomnia 10/07/2017  . Dehydration 10/07/2017  . Port-A-Cath in place 09/30/2017  . Genetic testing 06/27/2017  . Family history of breast cancer   . Malignant neoplasm of upper-inner quadrant of right breast in female, estrogen receptor positive ([place]) 06/26/2017    Gloria Ware W. 01/15/2020, 2:52 PM  Frazier Butt., PT   [facility] [address] [place], [place], [address] Phone: [phone]   Fax:  [phone]  Name: GARCIA MEDEMA MRN: [id number] Date of Birth: 12/15/1945

## 2020-01-19 ENCOUNTER — Institutional Professional Consult (permissible substitution): Payer: Medicare Other | Admitting: Clinical

## 2020-01-19 ENCOUNTER — Ambulatory Visit: Payer: Medicare Other | Admitting: Neurology

## 2020-01-19 NOTE — Progress Notes (Signed)
Assessment/Plan:   1.  Parkinsons Disease  -Discussed nature and pathophysiology.  -Discussed community support groups and education.  -Discussed multidisciplinary concept.  She is currently in physical therapy.  -Discussed medication.  Decided to slowly start carbidopa/levodopa 25/100 and slowly work to 1 tablet 3 times per day.  R/B/SE were discussed.  The opportunity to ask questions was given and they were answered to the best of my ability.  The patient expressed understanding and willingness to follow the outlined treatment protocols.  -We discussed that it used to be thought that levodopa would increase risk of melanoma but now it is believed that Parkinsons itself likely increases risk of melanoma. she is to get regular skin checks.  -Discussed last visit genetic testing for Parkinson's disease.  She was to think about that and let me know if she wanted to do that today.  She reports to hold off on that for now, which is reasonable.  -on propranolol 10 mg bid.  Started by oncology for tremor.  We decided to start weaning this in one month, after she gets up on the levodopa.  This may be important esp since her BP is low.  2.  GAD  -Patient is in counseling.  Encouraged her to continue that.  -met with my Parkinsons Disease social worker today  3.  History of breast cancer, diagnosed 2018.  -Following with oncology.  -Status post mastectomy.  -Remains very anxious about that diagnosis.  4.  Follow up is anticipated in the next 4-6 months, sooner should new neurologic issues arise.   Subjective:   Gloria Ware was seen today in follow up for Parkinsons disease.  My previous records were reviewed prior to todays visit as well as outside records available to me.  She was seen a few weeks ago and we talked about the diagnosis, but ultimately decided to hold on medications until today.  Pt denies falls.  We referred her for physical therapy last visit and she has started that.  Pt  denies lightheadedness, near syncope.  No hallucinations.    Current prescribed movement disorder medications: Propranolol, 10 mg twice per day (on this prior to seeing me, but the patient thinks it is helpful)   PREVIOUS MEDICATIONS: Sinemet and Propranolol  ALLERGIES:   Allergies  Allergen Reactions  . Penicillins Other (See Comments)    Throat burning Has patient had a PCN reaction causing immediate rash, facial/tongue/throat swelling, SOB or lightheadedness with hypotension: No Has patient had a PCN reaction causing severe rash involving mucus membranes or skin necrosis: No Has patient had a PCN reaction that required hospitalization: No Has patient had a PCN reaction occurring within the last 10 years: No If all of the above answers are "NO", then may proceed with Cephalosporin use.   . Sulfa Antibiotics Nausea And Vomiting  . Atorvastatin      Muscle aches   . Zetia [Ezetimibe] Other (See Comments)    Muscle aches     CURRENT MEDICATIONS:  Outpatient Encounter Medications as of 01/21/2020  Medication Sig  . acetaminophen (TYLENOL) 500 MG tablet Take 500 mg by mouth daily as needed for moderate pain or headache.  . anastrozole (ARIMIDEX) 1 MG tablet Take 1 tablet (1 mg total) by mouth daily.  . Calcium Carbonate-Vitamin D (CALTRATE 600+D PO) Take 1 tablet by mouth 2 (two) times daily.  Marland Kitchen docusate sodium (COLACE) 100 MG capsule Take 100 mg by mouth at bedtime.  . feeding supplement (BOOST HIGH PROTEIN)  LIQD Take by mouth every morning.  Marland Kitchen MELATONIN PO Take 1 tablet by mouth at bedtime as needed.   . propranolol (INDERAL) 10 MG tablet TAKE 1 TABLET BY MOUTH TWICE A DAY  . sertraline (ZOLOFT) 100 MG tablet Take 200 mg by mouth daily.   . [DISCONTINUED] anastrozole (ARIMIDEX) 1 MG tablet Take 0.5 tablets (0.5 mg total) by mouth daily.  . [DISCONTINUED] aspirin 81 MG tablet Take 81 mg by mouth daily.     No facility-administered encounter medications on file as of 01/21/2020.     Objective:   PHYSICAL EXAMINATION:    VITALS:   Vitals:   01/21/20 1247  BP: 100/60  Weight: 136 lb (61.7 kg)  Height: _0  (1.549 m)    GEN:  The patient appears stated age and is in NAD. HEENT:  Normocephalic, atraumatic.  The mucous membranes are moist. The superficial temporal arteries are without ropiness or tenderness. CV:  RRR Lungs:  CTAB Neck/HEME:  There are no carotid bruits bilaterally.  Neurological examination:  Orientation: The patient is alert and oriented x3. Cranial nerves: There is good facial symmetry with facial hypomimia. The speech is fluent and clear. Soft palate rises symmetrically and there is no tongue deviation. Hearing is intact to conversational tone. Sensation: Sensation is intact to light touch throughout Motor: Strength is at least antigravity x4.  Movement examination: Tone: There is increased tone in the left upper and lower extremity. Abnormal movements: There is left upper and bilateral lower extremity tremor at rest Coordination:  There is  decremation with RAM's, Gait and Station: The patient has no difficulty arising out of a deep-seated chair without the use of the hands. The patient's stride length is decreased and she is somewhat unstable   Total time spent on today's visit was 30 minutes, including both face-to-face time and nonface-to-face time.  Time included that spent on review of records (prior notes available to me/labs/imaging if pertinent), discussing treatment and goals, answering patient's questions and coordinating care.  Cc:  [name], MD

## 2020-01-20 NOTE — Progress Notes (Signed)
Patient Care Team: [name], MD as PCP - General (Family Medicine) [name], MD as Consulting Physician (General Surgery) Nicholas Lose, MD as Consulting Physician (Hematology and Oncology) Kyung Rudd, MD as Consulting Physician (Radiation Oncology) Delice Bison, Charlestine Massed, NP as Nurse Practitioner (Hematology and Oncology) Tat, Eustace Quail, DO as Consulting Physician (Neurology)  DIAGNOSIS:    ICD-10-CM   1. Malignant neoplasm of upper-inner quadrant of right breast in female, estrogen receptor positive ([place])  C50.211    Z17.0     SUMMARY OF ONCOLOGIC HISTORY: Oncology History  Malignant neoplasm of upper-inner quadrant of right breast in female, estrogen receptor positive ([place])  06/21/2017 Initial Diagnosis   Screening detected right breast mass and calcifications 1 cm at 1:00 position biopsy of mass and calcifications revealed invasive lobular cancer with pleomorphic features, grade 2, axillary lymph node biopsy positive for cancer, ER/PR positive HER-2 negative with a Ki-67 of 40%, T1b N1 stage IB AJCC 8   06/27/2017 Genetic Testing   Genetic testing was negative and did not reveal any mutations.  Genes tested include: ALK, APC, ATM, AXIN2, BAP1, BARD1, BLM, BMPR1A, BRCA1, BRCA2, BRIP1, CASR, CDC73, CDH1, CDK4, CDKN1B, CDKN1C, CDKN2A, CEBPA, CHEK2, CTNNA1, DICER1, DIS3L2, EGFR, EPCAM, FH, FLCN, GATA2, GPC3, GREM1, HOXB13, HRAS, KIT, MAX, MEN1, MET, MITF, MLH1, MSH2, MSH3, MSH6, MUTYH, NBN, NF1, NF2, NTHL1, PALB2, PDGFRA, PHOX2B, PMS2, POLD1, POLE, POT1, PRKAR1A, PTCH1, PTEN, RAD50, RAD51C, RAD51D, RB1, RECQL4, RET, RUNX1, SDHA, SDHAF2, SDHB, SDHC, SDHD, SMAD4, SMARCA4, SMARCB1, SMARCE1, STK11, SUFU, TERC, TERT, TMEM127, TP53, TSC1, TSC2, VHL, WRN, WT1.   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, LCIS, margins negative, 2/6 lymph nodes +1 with isolated tumor cells, T1BN1 stage I the AJCC 8 ER 95%, PR 30%,  HER-2 negative, Ki-67 40%   08/12/2017 Miscellaneous   Mammaprint high risk luminal type B   09/17/2017 -  Chemotherapy   Adjuvant chemo with dose dense Adriamycin and Cytoxan X 2 (Stopped due to toxicities) followed by Taxol weekly x1 stopped due to toxicities    10/07/2017 - 10/09/2017 Hospital Admission   Adm for failure to thrive, insomnia and back pain   11/28/2017 - 01/13/2018 Radiation Therapy   Adj XRT   02/2018 -  Anti-estrogen oral therapy   Anastrozole daily, reduced to 1/2 tablet daily on 04/02/19 due to myalgias     CHIEF COMPLIANT: Follow-up of right breast cancer on anastrozole  INTERVAL HISTORY: Gloria Ware is a 74 y.o. with above-mentioned history of right breast cancer treated with mastectomy, adjuvant chemotherapy, and radiationwho is currently on antiestrogen therapy with anastrozole1/2 tablet daily. Mammogram on 12/16/19 showed a probably benign abnormality in the right breast recommended or short term follow-up. She presents to the clinic todayfor follow-up.  She currently is in a wheelchair but she is able to drive.  Her tremors are better but she was recently diagnosed with Parkinson's disease.  She is going to see her neurologist today to talk about treatment options.  She has been tolerating half a tablet of anastrozole extremely well and wishes to go back on full tablet daily.  She had necrotic cysts on her breast which were evaluated by ultrasound.  ALLERGIES:  is allergic to penicillins; sulfa antibiotics; atorvastatin; and zetia [ezetimibe].  MEDICATIONS:  Current Outpatient Medications  Medication Sig Dispense Refill  . acetaminophen (TYLENOL) 500 MG tablet Take 500 mg by mouth daily as needed for moderate pain or headache.    Marland Kitchen  anastrozole (ARIMIDEX) 1 MG tablet Take 0.5 tablets (0.5 mg total) by mouth daily. 90 tablet 1  . aspirin 81 MG tablet Take 81 mg by mouth daily.      . Calcium Carbonate-Vitamin D (CALTRATE 600+D PO) Take 1 tablet by mouth 2 (two)  times daily.    Marland Kitchen docusate sodium (COLACE) 100 MG capsule Take 100 mg by mouth at bedtime.    . feeding supplement (BOOST HIGH PROTEIN) LIQD Take by mouth every morning.    Marland Kitchen MELATONIN PO Take 1 tablet by mouth at bedtime as needed.     . propranolol (INDERAL) 10 MG tablet TAKE 1 TABLET BY MOUTH TWICE A DAY 180 tablet 0  . sertraline (ZOLOFT) 100 MG tablet Take 200 mg by mouth daily.      No current facility-administered medications for this visit.    PHYSICAL EXAMINATION: ECOG PERFORMANCE STATUS: 1 - Symptomatic but completely ambulatory  Vitals:   01/21/20 1138  BP: (!) 100/46  Pulse: 88  Resp: 18  Temp: 98.9 F (37.2 C)  SpO2: 98%   Filed Weights   01/21/20 1138  Weight: 136 lb 6.4 oz (61.9 kg)      LABORATORY DATA:  I have reviewed the data as listed CMP Latest Ref Rng & Units 12/01/2019 01/14/2018 11/05/2017  Glucose 65 - 99 mg/dL 94 106 104(H)  BUN 7 - 25 mg/dL _0 Creatinine 0.60 - 0.93 mg/dL 0.86 0.86 0.70  Sodium 135 - 146 mmol/L 137 138 136  Potassium 3.5 - 5.3 mmol/L 4.2 4.2 3.8  Chloride 98 - 110 mmol/L 101 103 100(L)  CO2 20 - 32 mmol/L 28 27 -  Calcium 8.6 - 10.4 mg/dL 10.1 9.9 -  Total Protein 6.1 - 8.1 g/dL 7.2 7.3 -  Total Bilirubin 0.2 - 1.2 mg/dL 0.5 0.2 -  Alkaline Phos 40 - 150 U/L - 181(H) -  AST 10 - 35 U/L 21 30 -  ALT 6 - 29 U/L 24 65(H) -    Lab Results  Component Value Date   WBC 4.2 01/14/2018   HGB 12.2 01/14/2018   HCT 36.3 01/14/2018   MCV 91.9 01/14/2018   PLT 161 01/14/2018   NEUTROABS 2.7 01/14/2018    ASSESSMENT & PLAN:  Malignant neoplasm of upper-inner quadrant of right breast in female, estrogen receptor positive ([place]) 08/06/2017 right mastectomy: Scattered foci of invasive lobular cancer, grade 2, 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 Mammaprint high risk: Dose since  Adriamycin and Cytoxan x57fllowed by Taxol weekly x1 09/23/2017-10/21/17 stopped for toxicities 2. adjuvant radiation therapy2/21/19- 01/13/18 3. Followed by adjuvant antiestrogen therapy -------------------------------------------------------------------------------------------------------------------------------------- Current treatment: Anti estrogen therapy withanastrozole1 mg daily starting 02/05/18, reduced to 0.5 mg daily 05/18/2019 She stopped anastrozole briefly but her symptoms did not improve.  Resumed at half a tablet daily will be increased to a full tablet 01/21/2020.  Anastrozoletoxicities: 1.Muscle and joint pains 2.Tremors: Patient was diagnosed with Parkinson's disease.  She has an appointment this afternoon this week her neurologist to talk about parkinsonian treatment.  I instructed her to discuss propranolol with her neurologist.  It may be discontinued if other options are available because of her low blood pressure issues. 3.  Emotional issues: Patient has severe anxiety and it appears that everything upsets her significantly. She is seeing a cSocial worker  She may be seeing  a psychiatrist soon. Currently on anastrozole at half a tablet daily.  She will increase it to full tablet daily because she feels that she tolerates it fairly well.   Breast cancer surveillance: 1.Left mammogram 07/22/2019: Benign breast density category C 2.Bone density: 05/12/2019: Osteopenia T score -2 3.Breast examination 01/21/2020: Benign, right mastectomy, small palpable nodule along the surgical scar which is related to scar tissue. There is no concern for recurrent breast cancer.  Her husband had prostate cancer and had received prostate seed implantation.  Return to clinic in 1 year for follow-up    No orders of the defined types were placed in this encounter.  The patient has a good understanding of the overall plan. she agrees with it. she will call with any problems that  may develop before the next visit here.  Total time spent: 30 mins including face to face time and time spent for planning, charting and coordination of care  Nicholas Lose, MD 01/21/2020  I, Cloyde Reams Dorshimer, am acting as scribe for Dr. Nicholas Lose.  I have reviewed the above documentation for accuracy and completeness, and I agree with the above.

## 2020-01-21 ENCOUNTER — Inpatient Hospital Stay: Payer: Medicare Other | Attending: Hematology and Oncology | Admitting: Hematology and Oncology

## 2020-01-21 ENCOUNTER — Telehealth: Payer: Self-pay | Admitting: Hematology and Oncology

## 2020-01-21 ENCOUNTER — Encounter: Payer: Self-pay | Admitting: Neurology

## 2020-01-21 ENCOUNTER — Ambulatory Visit (INDEPENDENT_AMBULATORY_CARE_PROVIDER_SITE_OTHER): Payer: Medicare Other | Admitting: Clinical

## 2020-01-21 ENCOUNTER — Ambulatory Visit (INDEPENDENT_AMBULATORY_CARE_PROVIDER_SITE_OTHER): Payer: Medicare Other | Admitting: Neurology

## 2020-01-21 ENCOUNTER — Other Ambulatory Visit: Payer: Self-pay

## 2020-01-21 VITALS — BP 100/60 | Ht 61.0 in | Wt 136.0 lb

## 2020-01-21 DIAGNOSIS — Z923 Personal history of irradiation: Secondary | ICD-10-CM | POA: Insufficient documentation

## 2020-01-21 DIAGNOSIS — C50211 Malignant neoplasm of upper-inner quadrant of right female breast: Secondary | ICD-10-CM | POA: Insufficient documentation

## 2020-01-21 DIAGNOSIS — Z7982 Long term (current) use of aspirin: Secondary | ICD-10-CM | POA: Insufficient documentation

## 2020-01-21 DIAGNOSIS — G20 Parkinson's disease: Secondary | ICD-10-CM | POA: Insufficient documentation

## 2020-01-21 DIAGNOSIS — F411 Generalized anxiety disorder: Secondary | ICD-10-CM

## 2020-01-21 DIAGNOSIS — Z9221 Personal history of antineoplastic chemotherapy: Secondary | ICD-10-CM | POA: Insufficient documentation

## 2020-01-21 DIAGNOSIS — Z79811 Long term (current) use of aromatase inhibitors: Secondary | ICD-10-CM | POA: Insufficient documentation

## 2020-01-21 DIAGNOSIS — Z170 Estrogen receptor positive status [ER+]: Secondary | ICD-10-CM | POA: Diagnosis not present

## 2020-01-21 DIAGNOSIS — Z79899 Other long term (current) drug therapy: Secondary | ICD-10-CM | POA: Insufficient documentation

## 2020-01-21 MED ORDER — ANASTROZOLE 1 MG PO TABS: 1.0000 mg | ORAL_TABLET | Freq: Every day | ORAL | 3 refills | Status: DC

## 2020-01-21 MED ORDER — CARBIDOPA-LEVODOPA 25-100 MG PO TABS: 1.0000 | ORAL_TABLET | Freq: Three times a day (TID) | ORAL | 1 refills | Status: DC

## 2020-01-21 NOTE — Assessment & Plan Note (Signed)
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 Mammaprint high risk: Dose since Adriamycin and Cytoxan x19fllowed by Taxol weekly x1 09/23/2017-10/21/17 stopped for toxicities 2. adjuvant radiation therapy2/21/19- 01/13/18 3. Followed by adjuvant antiestrogen therapy -------------------------------------------------------------------------------------------------------------------------------------- Current treatment: Anti estrogen therapy withanastrozole1 mg daily starting 02/05/18, reduced to 0.5 mg daily 05/18/2019 She stopped anastrozole briefly but her symptoms did not improve.  Anastrozoletoxicities: 1.Muscle and joint pains 2.Tremors: I sent a prescription for propranolol 10 mg p.o. twice daily.  This is a very low dose given her history of low blood pressures. 3.  Emotional issues: Patient has severe anxiety and it appears that everything upsets her significantly.  Currently on anastrozole at half a tablet daily.   Breast cancer surveillance: 1.Left mammogram 07/22/2019: Benign breast density category C 2.Bone density: 05/12/2019: Osteopenia T score -2 3.Breast examination 01/21/2020: Benign, right mastectomy, small palpable nodule along the surgical scar which is related to scar tissue. There is no concern for recurrent breast cancer.  Her husband is going through several health issues related to his stomach.  He is also scheduled to undergo prostate biopsy.  Return to clinic in 6 months for follow-up

## 2020-01-21 NOTE — Telephone Encounter (Signed)
Scheduled per 04/15 los, patient has been called and notified. 

## 2020-01-21 NOTE — BH Specialist Note (Signed)
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 Wells Guiles Tat. LCSW provided patient education re motor and nonmotor Parkinson's symptoms including apathy, depression, incontinence/constipation, sleep behavior disorders, communication and cognitive impairment and the value of multidisciplinary care to manage symptoms. LCSW provided supportive counseling as patient endorses h/o anxiety and current behavioral health treatment with therapist, upcoming appt for psychiatric medication management with [facility].        LCSW provided pt with information about our support and educational groups for patients with Parkinson's as well as their care partners, also discussed the importance of forced intense exercise in the management of PD and provided information about current exercise opportunities. Also provided pt with counseling information for local women's Parkinson's group. Pt responded receptively to patient education today. Pt strengths include high motivation for treatment adherence, partner support, active lifestyle. Pt would benefit from targeting exercise to include more forced intense exercise and engagement with Parkinson's  community resource to aid in adjustment to diagnosis.    Brief Interventions provided today in session 1. psychoeducation, patient education 2. Supportive counseling    Plan 1. Contact LCSW with any questions related to Parkinson's & behavioral health  2. Goal for increased exercise- consider Parkinson's On the Move once PT has been established, other exercise classes info provided as well  3. Consider group counseling and social education groups for increased support  Behavioral Health treatment recommendations communicated to referring provider and pt states agreement with plan. LCSW will remain available for future consultation

## 2020-01-21 NOTE — Patient Instructions (Addendum)
1.  Start Carbidopa Levodopa as follows:  Take 1/2 tablet three times daily, at least 30 minutes before meals (approximately 8am/noon/4pm), for one week  Then take 1/2 tablet in the morning, 1/2 tablet in the afternoon, 1 tablet in the evening, at least 30 minutes before meals, for one week  Then take 1/2 tablet in the morning, 1 tablet in the afternoon, 1 tablet in the evening, at least 30 minutes before meals, for one week  Then take 1 tablet three times daily at 8am/noon/4pm, at least 30 minutes before meals   As a reminder, carbidopa/levodopa can be taken at the same time as a carbohydrate, but we like to have you take your pill either 30 minutes before a protein source or 1 hour after as protein can interfere with carbidopa/levodopa absorption.  2.  In one month, decrease propranolol to 10 mg once per day for a week and then STOP the propranolol

## 2020-01-22 ENCOUNTER — Other Ambulatory Visit: Payer: Self-pay | Admitting: Hematology and Oncology

## 2020-01-26 ENCOUNTER — Institutional Professional Consult (permissible substitution): Payer: Medicare Other | Admitting: Clinical

## 2020-01-26 ENCOUNTER — Other Ambulatory Visit: Payer: Self-pay | Admitting: Hematology and Oncology

## 2020-01-26 ENCOUNTER — Ambulatory Visit: Payer: Medicare Other | Admitting: Neurology

## 2020-01-26 DIAGNOSIS — F411 Generalized anxiety disorder: Secondary | ICD-10-CM | POA: Diagnosis not present

## 2020-01-27 ENCOUNTER — Telehealth: Payer: Self-pay

## 2020-01-27 NOTE — Telephone Encounter (Signed)
I must be misunderstanding something.  Didn't the message this morning just say that she wasn't tolerating carbidopa/levodopa 25/100 (dopamine)?

## 2020-01-27 NOTE — Telephone Encounter (Signed)
Received call from patient stating that she is recently started Dopamine  And this morning has been experiencing vertigo and nausea since this morning. She stated once she woke up and immediately started feeling dizzy. She states she took all her meds this morning with crackers and jam.  She states she is supposed to drive to [place], but does not know if she can because once the car starts moving she immediately got dizzy and broke out into a cold sweat. She wanted to know what she should do. She said she is pulled over on [address] and will wait there until she hears back from our office.

## 2020-01-27 NOTE — Telephone Encounter (Signed)
Pt called back no answer voice mail left for pt to call back  

## 2020-01-27 NOTE — Telephone Encounter (Signed)
She obviously should not drive if she feels bad!  And that medication only lasts about 4 hours so if its from the medication it will resolve.  Do I recall that she has had vertigo before?  I would see if someone else can drive her and restart the medication when she comes back.

## 2020-01-27 NOTE — Telephone Encounter (Signed)
Pt advised per Dr Tat she can stop the propranolol as it may have dropped her blood pressure and made her dizzy. Pt verbalized understanding

## 2020-01-27 NOTE — Telephone Encounter (Signed)
Pt called back was informed of Dr.Tat recommendations, pt asked if she can stop the propranolol now and continue the dopamine she feels the dopamine is helping her,

## 2020-01-27 NOTE — Telephone Encounter (Signed)
My suspicion is that she had too little on her stomach but yes, she can stop the propranolol as it may have dropped her blood pressure and made her dizzy.

## 2020-02-02 ENCOUNTER — Other Ambulatory Visit: Payer: Self-pay

## 2020-02-02 ENCOUNTER — Ambulatory Visit: Payer: Medicare Other | Admitting: Physical Therapy

## 2020-02-02 DIAGNOSIS — R293 Abnormal posture: Secondary | ICD-10-CM

## 2020-02-02 DIAGNOSIS — R2689 Other abnormalities of gait and mobility: Secondary | ICD-10-CM | POA: Diagnosis not present

## 2020-02-02 DIAGNOSIS — R29818 Other symptoms and signs involving the nervous system: Secondary | ICD-10-CM

## 2020-02-02 DIAGNOSIS — R2681 Unsteadiness on feet: Secondary | ICD-10-CM | POA: Diagnosis not present

## 2020-02-02 DIAGNOSIS — M6281 Muscle weakness (generalized): Secondary | ICD-10-CM | POA: Diagnosis not present

## 2020-02-02 NOTE — Therapy (Signed)
[facility] [address] [place], [place], [address] Phone: [phone]   Fax:  [phone]  Physical Therapy Treatment  Patient Details  Name: Gloria Ware MRN: [id number] Date of Birth: 07-01-1946 Referring Provider (PT): Alonza Bogus, DO   Encounter Date: 02/02/2020  PT End of Session - 02/02/20 2056    Visit Number  3    Number of Visits  18    Date for PT Re-Evaluation  99991111   90 day cert for 9 wk POC   Authorization Type  Medicare    Progress Note Due on Visit  10    PT Start Time  1701    PT Stop Time  1744    PT Time Calculation (min)  43 min    Equipment Utilized During Treatment  Gait belt    Activity Tolerance  Patient tolerated treatment well    Behavior During Therapy  [facility] for tasks assessed/performed       Past Medical History:  Diagnosis Date  . Anxiety   . Breast CA ([place])   . Colon polyp 09/12/2005  . Depression   . Eczema    right forearm   . Genetic testing 06/27/2017   Multi-Cancer panel (83 genes) @ [facility] - No pathogenic mutations detected  . Hearing aid worn    B/L  . Hearing loss   . High cholesterol   . Injury     left foot metatarsal fracture sustined 08-03-17 , treated with boot; patient  reports  improvement as of today  . OCD (obsessive compulsive disorder)   . Osteopenia 05/2019   T score -2.0 FRAX 32% / 16%  . Personal history of chemotherapy   . Personal history of radiation therapy   . PONV (postoperative nausea and vomiting)    post op nausea; took a long time waking up  . Tinnitus     Past Surgical History:  Procedure Laterality Date  . BREAST SURGERY     Mastectomy-Right  . CARDIAC CATHETERIZATION     husband was having symtpoms and needed a stent; subsequently she states she started " to feel something"  but thinks it was " all in my head bc of my hbsand". subsequently saw same cardiologist as her husband who sent for treadmill stress that had  inconcluisicve results so she was sent for cardiac catherization which she states was " definitely negative "   . cataract surg    . COLONOSCOPY     with polypectomy  . DILATION AND CURETTAGE OF UTERUS    . IR CV LINE INJECTION  09/30/2017  . MASTECTOMY Right   . MASTECTOMY W/ SENTINEL NODE BIOPSY Right 08/06/2017   Procedure: RIGHT MASTECTOMY WITH SENTINEL LYMPH NODE BIOPSY;  Surgeon: [name], MD;  Location: [facility];  Service: General;  Laterality: Right;  . MOUTH SURGERY    . PORT-A-CATH REMOVAL Left 01/20/2018   Procedure: REMOVAL PORT-A-CATH;  Surgeon: [name], MD;  Location: [facility];  Service: General;  Laterality: Left;  . PORTACATH PLACEMENT N/A 09/12/2017   Procedure: INSERTION PORT-A-CATH;  Surgeon: [name], MD;  Location: [facility];  Service: General;  Laterality: N/A;  . RADIOACTIVE SEED GUIDED AXILLARY SENTINEL LYMPH NODE Right 08/06/2017   Procedure: SEED TARGETED AXILLARY LYMPH NODE EXCISION;  Surgeon: [name], MD;  Location: [facility];  Service: General;  Laterality: Right;    There were no vitals filed for this visit.  Subjective Assessment - 02/02/20 1703  Subjective  Had an episode of dizziness - was in the car and felt like the car spinning. Called Dr. Carles Collet about it - stopped the propranolol and is now just taking levodopa. That was the only episode she had. Needs to change her appointment on Friday due to a scheduling conflict. Forgot her walking stick today. Has been doing her walking and HEP. Feels like her core is weak.    Pertinent History  PMH includes breast cancer, anxiety, depression, L foot fracture, L wrist fracture from fall in December; OCD, osteopenia    Patient Stated Goals  Pt's goal for therapy is to get onto and out of a chair without arms, getting in and out of the car, walking comfortably and turning.    Currently in Pain?  No/denies    Pain Onset  More than a month ago                     Pt performs PWR! Moves in seated position (reviewed previous HEP):     PWR! Up for improved posture x 10 reps  PWR! Rock for improved weighshifting x 5 reps each side - pt reports not performing this one at home, reviewed technique and keeping opposite leg kicked out and straight   PWR! Step for improved step initiation x 5 reps double step out/in, performed an additional 8 reps of just single step out/in over black foam beam for cues for increased intensity to lift legs up, pt improving with each rep   Verbal and visual cues provided for technique and intensity.      [facility] Adult PT Treatment/Exercise - 02/02/20 0001      Transfers   Transfers  Sit to Stand;Stand to Sit    Sit to Stand  With upper extremity assist;From chair/3-in-1;5: Supervision;From bed    Sit to Stand Details  Verbal cues for sequencing;Verbal cues for technique;Visual cues/gestures for sequencing    Stand to Sit  With upper extremity assist;To chair/3-in-1;5: Supervision;To bed      Ambulation/Gait   Ambulation/Gait  Yes    Ambulation/Gait Assistance  5: Supervision;4: Min guard    Ambulation/Gait Assistance Details  pt did not bring SPC into session today, cues for longer step lengths B and then cues for arm swing to assist with balance, pt reporting feeling more stable and able to maintain gait pattern throughout bout of gait with addiitonal reminder cues for arm swing    Ambulation Distance (Feet)  130 Feet    Assistive device  None    Gait Pattern  Step-through pattern;Decreased arm swing - left;Decreased step length - right;Decreased step length - left;Narrow base of support;Decreased arm swing - right    Ambulation Surface  Level;Indoor      Neuro Re-ed    Neuro Re-ed Details   performed push and release test to assess stepping balance strategies x2 reps each direction: forward (2 bigger steps to maintain balance), laterally (2 steps both directions to maintain balance),  posterior (3 small, shuffled steps - min guard, pt with incr fear of going backwards)             PT Education - 02/02/20 2055    Education Details  reviewed seated PWR moves for HEP    Person(s) Educated  Patient    Methods  Explanation;Demonstration;Handout    Comprehension  Verbalized understanding;Returned demonstration       PT Short Term Goals - 01/12/20 1553      PT SHORT TERM GOAL #  1   Title  Pt will be independent with HEP for improved transfers, strength, balance, and gait.  TARGET 03/14/2020 for all STGs    Time  5    Period  Weeks    Status  New      PT SHORT TERM GOAL #2   Title  Pt will improve 5x sit<>stand to less than or equal to 13 seconds (with UE support), for improved transfer efficiency and lower extremity strength.    Baseline  15.28 sec with UE support    Time  5    Period  Weeks      PT SHORT TERM GOAL #3   Title  Pt will improve TUG score to less than or equal to 18 seconds for decreased fall risk.    Baseline  24.5 sec    Time  5    Period  Weeks      PT SHORT TERM GOAL #4   Title  Pt will improve DGI score to at least 16/24 for decreased fall risk.    Baseline  13/24    Time  5    Period  Weeks    Status  New      PT SHORT TERM GOAL #5   Title  Pt will report at least 50% improvement in car transfers, for improved overall functional mobility.    Time  5    Period  Weeks    Status  New      Additional Short Term Goals   Additional Short Term Goals  Yes      PT SHORT TERM GOAL #6   Title  Pt will verbalize understanding of fall prevention in home environment, including tips to reducing freezing with gait.    Time  5    Period  Weeks    Status  New        PT Long Term Goals - 01/12/20 1556      PT LONG TERM GOAL #1   Title  Pt will be independent with progression of Parkinson's specific exercise program to improve overall functional mobility.  TARGET 9 weeks: 03/11/2020 for all LTGs    Time  9    Period  Weeks    Status  New       PT LONG TERM GOAL #2   Title  Pt will perform at least 4 of 5 reps of sit<>stand from 18" chairs or lower, with minimal to no UE support, no LOB, for improved transfer efficiency and safety.    Time  9    Period  Weeks    Status  New      PT LONG TERM GOAL #3   Title  Pt will improve TUG score to less than or equal to 13.5 seconds for decreased fall risk.    Baseline  24.5 sec at eval    Time  9    Period  Weeks    Status  New      PT LONG TERM GOAL #4   Title  Pt will improve DGI score to at least 19/24 for decreased fall risk.    Baseline  13/24 at eval    Time  9    Period  Weeks    Status  New      PT LONG TERM GOAL #5   Title  Pt will improve gait velocity to at least 2.3 ft/sec for improved gait efficiency and safety.    Baseline  1.88 ft/sec at eval  Time  9    Period  Weeks    Status  New            Plan - 02/02/20 2100    Clinical Impression Statement  Reviewed seated PWR moves and transfer training HEP given at last session. Pt had not previously been performing seated PWR! rock at home for weight shift, needed verbal and demonstrative cues for technique. Pt did not ambulate into session today with SPC, worked on gait training with cues for step length and B arm swing with pt also reporting feeling more stable with gait. Discussed pt bringing SPC in again to next session. Performed push and release test with pt able to maintain balance in forward and lateral directions with 2 bigger steps and in posterior direction pt performed with 3 shuffled steps and needed close min guard (pt also more fearful with posterior direction). Will continue to progress towards LTGs.    Personal Factors and Comorbidities  Comorbidity 3+    Comorbidities  PMH includes breast cancer, anxiety, depression, L foot fracture, L wrist fracture from fall in December; OCD, osteopenia    Examination-Activity Limitations  Locomotion Level;Transfers;Stairs;Stand;Other   car transfers    Examination-Participation Restrictions  Community Activity;Other   Community exercise and walking in neighborhood   Stability/Clinical Decision Making  Evolving/Moderate complexity   PD dx in February 2021, not yet started on PD medications   Rehab Potential  Good    PT Frequency  2x / week    PT Duration  Other (comment)   9 weeks, including eval week   PT Treatment/Interventions  ADLs/Self Care Home Management;DME Instruction;Neuromuscular re-education;Balance training;Therapeutic exercise;Therapeutic activities;Functional mobility training;Stair training;Gait training;Patient/family education    PT Next Visit Plan  posterior stepping strategies, continue sit <> stand training, how did car transfers and transfers from recliner go?  further gait training, standing PWR! Moves (at counter for support)    Consulted and Agree with Plan of Care  Patient       Patient will benefit from skilled therapeutic intervention in order to improve the following deficits and impairments:  Abnormal gait, Difficulty walking, Impaired tone, Decreased balance, Decreased mobility, Decreased strength, Postural dysfunction  Visit Diagnosis: Other symptoms and signs involving the nervous system  Unsteadiness on feet  Other abnormalities of gait and mobility  Abnormal posture     Problem List Patient Active Problem List   Diagnosis Date Noted  . Parkinson's disease ([place]) 12/30/2019  . Insomnia 10/07/2017  . Dehydration 10/07/2017  . Port-A-Cath in place 09/30/2017  . Genetic testing 06/27/2017  . Family history of breast cancer   . Malignant neoplasm of upper-inner quadrant of right breast in female, estrogen receptor positive ([place]) 06/26/2017    Arliss Journey, PT, DPT  02/02/2020, 9:13 PM  [facility] [address] [place], [place], [address] Phone: [phone]   Fax:  [phone]  Name: Gloria Ware MRN: [id number] Date of  Birth: 01-22-1946

## 2020-02-03 DIAGNOSIS — F411 Generalized anxiety disorder: Secondary | ICD-10-CM | POA: Diagnosis not present

## 2020-02-04 ENCOUNTER — Ambulatory Visit (INDEPENDENT_AMBULATORY_CARE_PROVIDER_SITE_OTHER): Payer: Medicare Other | Admitting: Adult Health

## 2020-02-04 ENCOUNTER — Other Ambulatory Visit: Payer: Self-pay

## 2020-02-04 ENCOUNTER — Encounter: Payer: Self-pay | Admitting: Adult Health

## 2020-02-04 VITALS — BP 126/68 | HR 94 | Ht 61.0 in | Wt 135.0 lb

## 2020-02-04 DIAGNOSIS — F411 Generalized anxiety disorder: Secondary | ICD-10-CM

## 2020-02-04 MED ORDER — LORAZEPAM 0.5 MG PO TABS: 0.5000 mg | ORAL_TABLET | Freq: Every day | ORAL | 2 refills | Status: DC | PRN

## 2020-02-04 NOTE — Progress Notes (Signed)
[facility] MD/PA/NP Initial Note  02/04/2020 2:42 PM Gloria Ware  MRN:  [id number]  Chief Complaint:   HPI:   Describes mood today as "ok".  Mood symptoms - denies depression and irritability. Feels more anxious "overall". Stating "It has been through the roof at times". Stating "I've gotten it under control for the most part, but still has having times where I can't settle down". Also stating "I worry a lot, I've always been a worrier". Recently diagnosed with Parkinson's. Started on Carbidopa-Levodpa a few weeks ago. Feels like balance and walking I better. Husband recently diagnosed with prostate cancer - "doing well". Feels like she has taken Ativan and Valium in the past. Stating "I took it when I was first diagnosed with breast cancer".  Seeing therapist - Amada Jupiter for past few months. Stable interest and motivation. Taking medications as prescribed.  Energy levels lower. Stating "there is a lot of things I want to do around the house, but can't". Active, working with physical therapist. Walking most days. Balance has been an issue. Walking and balance is getting better. Can't stand for very long.  Enjoys some usual interests and activities. Married x 54 years. Has 2 grown sons - 17 in [place] and 1 in [place]. Talking to friends. In a book club.  Appetite adequate. Weight stable - 132 pounds. Sleeps well most nights. Averages 8 hours. Sometimes has issues falling asleep.  Focus and concentration stable. Reading. 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 Completing tasks. Managing some aspects of household.  Denies SI or HI. Denies AH or VH.  Previous medication trials: Zoloft  Visit Diagnosis: No diagnosis found.  Past Psychiatric History: Denies psychiatric hospitalization.  Past Medical History:  Past Medical History:  Diagnosis Date  . Anxiety   . Breast CA ([place])   . Colon polyp 09/12/2005  . Depression   . Eczema    right forearm   . Genetic testing 06/27/2017    Multi-Cancer panel (83 genes) @ [facility] - No pathogenic mutations detected  . Hearing aid worn    B/L  . Hearing loss   . High cholesterol   . Injury     left foot metatarsal fracture sustined 08-03-17 , treated with boot; patient  reports  improvement as of today  . OCD (obsessive compulsive disorder)   . Osteopenia 05/2019   T score -2.0 FRAX 32% / 16%  . Personal history of chemotherapy   . Personal history of radiation therapy   . PONV (postoperative nausea and vomiting)    post op nausea; took a long time waking up  . Tinnitus     Past Surgical History:  Procedure Laterality Date  . BREAST SURGERY     Mastectomy-Right  . CARDIAC CATHETERIZATION     husband was having symtpoms and needed a stent; subsequently she states she started " to feel something"  but thinks it was " all in my head bc of my hbsand". subsequently saw same cardiologist as her husband who sent for treadmill stress that had inconcluisicve results so she was sent for cardiac catherization which she states was " definitely negative "   . cataract surg    . COLONOSCOPY     with polypectomy  . DILATION AND CURETTAGE OF UTERUS    . IR CV LINE INJECTION  09/30/2017  . MASTECTOMY Right   . MASTECTOMY W/ SENTINEL NODE BIOPSY Right 08/06/2017   Procedure: RIGHT MASTECTOMY WITH SENTINEL LYMPH NODE BIOPSY;  Surgeon: [name], MD;  Location: [facility];  Service: General;  Laterality: Right;  . MOUTH SURGERY    . PORT-A-CATH REMOVAL Left 01/20/2018   Procedure: REMOVAL PORT-A-CATH;  Surgeon: [name], MD;  Location: [facility];  Service: General;  Laterality: Left;  . PORTACATH PLACEMENT N/A 09/12/2017   Procedure: INSERTION PORT-A-CATH;  Surgeon: [name], MD;  [facility];  Service: General;  Laterality: N/A;  . RADIOACTIVE SEED GUIDED AXILLARY SENTINEL LYMPH NODE Right 08/06/2017   Procedure: SEED TARGETED AXILLARY LYMPH NODE EXCISION;  Surgeon: [name], MD;  Location: [facility];  Service: General;  Laterality: Right;    Family Psychiatric History: Denies any family history of mental illness.  Family History:  Family History  Problem Relation Age of Onset  . Breast cancer Sister 73       currently 64  . Hypertension Maternal Grandmother   . Stroke Maternal Grandmother   . Hypertension Maternal Grandfather   . Stroke Maternal Grandfather   . Breast cancer Cousin        unconfirmed in maternal first cousin  . Ovarian cancer Cousin        unconfirmed if ovarian vs. uterine in paternal first cousin    Social History:  Social History   Socioeconomic History  . Marital status: Married    Spouse name: Not on file  . Number of children: Not on file  . Years of education: Not on file  . Highest education level: Not on file  Occupational History  . Occupation: retired    Comment: musician  Tobacco Use  . Smoking status: Never Smoker  . Smokeless tobacco: Never Used  Substance and Sexual Activity  . Alcohol use: Not Currently    Alcohol/week: 0.0 standard drinks    Comment: Rare wine  . Drug use: No  . Sexual activity: Not Currently    Partners: Male    Birth control/protection: Post-menopausal    Comment: 1st intercourse 55 yo-1 partner  Other Topics Concern  . Not on file  Social History Narrative   Right handed   Lives in 1 story home   Lives with spouse   Social Determinants of Health   Financial Resource Strain:   . Difficulty of Paying Living Expenses:   Food Insecurity:   . Worried About Charity fundraiser in the Last Year:   . Arboriculturist in the Last Year:   Transportation Needs:   . Film/video editor (Medical):   Marland Kitchen Lack of Transportation (Non-Medical):   Physical Activity:   . Days of Exercise per Week:   . Minutes of Exercise per Session:   Stress:   . Feeling of Stress :   Social Connections:   . Frequency of Communication with Friends and Family:   . Frequency of Social Gatherings with Friends and  Family:   . Attends Religious Services:   . Active Member of Clubs or Organizations:   . Attends Archivist Meetings:   Marland Kitchen Marital Status:     Allergies:  Allergies  Allergen Reactions  . Penicillins Other (See Comments)    Throat burning Has patient had a PCN reaction causing immediate rash, facial/tongue/throat swelling, SOB or lightheadedness with hypotension: No Has patient had a PCN reaction causing severe rash involving mucus membranes or skin necrosis: No Has patient had a PCN reaction that required hospitalization: No Has patient had a PCN reaction occurring within the last 10 years: No If all of the above answers  are "NO", then may proceed with Cephalosporin use.   . Sulfa Antibiotics Nausea And Vomiting  . Atorvastatin      Muscle aches   . Zetia [Ezetimibe] Other (See Comments)    Muscle aches     Metabolic Disorder Labs: No results found for: HGBA1C, MPG No results found for: PROLACTIN No results found for: CHOL, TRIG, HDL, CHOLHDL, VLDL, LDLCALC Lab Results  Component Value Date   TSH 2.54 12/01/2019    Therapeutic Level Labs: No results found for: LITHIUM No results found for: VALPROATE No components found for:  CBMZ  Current Medications: Current Outpatient Medications  Medication Sig Dispense Refill  . acetaminophen (TYLENOL) 500 MG tablet Take 500 mg by mouth daily as needed for moderate pain or headache.    . anastrozole (ARIMIDEX) 1 MG tablet TAKE 1 TABLET BY MOUTH DAILY 90 tablet 0  . Calcium Carbonate-Vitamin D (CALTRATE 600+D PO) Take 1 tablet by mouth 2 (two) times daily.    . carbidopa-levodopa (SINEMET IR) 25-100 MG tablet Take 1 tablet by mouth 3 (three) times daily. 8am/noon/4pm 270 tablet 1  . docusate sodium (COLACE) 100 MG capsule Take 100 mg by mouth at bedtime.    . feeding supplement (BOOST HIGH PROTEIN) LIQD Take by mouth every morning.    Marland Kitchen MELATONIN PO Take 1 tablet by mouth at bedtime as needed.     . sertraline (ZOLOFT)  100 MG tablet Take 200 mg by mouth daily.      No current facility-administered medications for this visit.    Medication Side Effects: none  Orders placed this visit:  No orders of the defined types were placed in this encounter.   Psychiatric Specialty Exam:  Review of Systems  Musculoskeletal: Negative for gait problem.  Neurological: Negative for tremors.  Psychiatric/Behavioral:       Please refer to HPI    Last menstrual period 07/30/1998.There is no height or weight on file to calculate BMI.  General Appearance: Neat and Well Groomed  Eye Contact:  Good  Speech:  Clear and Coherent and Normal Rate  Volume:  Normal  Mood:  Anxious  Affect:  Appropriate and Congruent  Thought Process:  Coherent and Descriptions of Associations: Intact  Orientation:  Full (Time, Place, and Person)  Thought Content: Logical   Suicidal Thoughts:  No  Homicidal Thoughts:  No  Memory:  WNL  Judgement:  Good  Insight:  Good  Psychomotor Activity:  Normal  Concentration:  Concentration: Good  Recall:  Good  Fund of Knowledge: Good  Language: Good  Assets:  Communication Skills Desire for Improvement Financial Resources/Insurance Housing Intimacy Leisure Time Physical Health Resilience Social Support Talents/Skills Transportation Vocational/Educational  ADL's:  Intact  Cognition: WNL  Prognosis:  Good   Screenings:  PHQ2-9     Follow Up  from 02/10/2018 in [facility] from 11/07/2017 in [facility] [facility]  PHQ-2 Total Score  0  2  PHQ-9 Total Score  0  7      Receiving Psychotherapy: Yes   Treatment Plan/Recommendations:   Plan:  PDMP reviewed  1. Zoloft 200mg  daily - PCP prescribing 2. Add Ativan 0.5mg  daily as needed for anxiety  Will call in 4 weeks with an update on medication changes  RTC 3 months  Patient advised to contact office with any questions, adverse effects, or acute worsening in  signs and symptoms.  Discussed potential benefits, risk, and side effects of benzodiazepines to include  potential risk of tolerance and dependence, as well as possible drowsiness.  Advised patient not to drive if experiencing drowsiness and to take lowest possible effective dose to minimize risk of dependence and tolerance.      Aloha Gell, NP

## 2020-02-05 ENCOUNTER — Ambulatory Visit: Payer: Medicare Other | Admitting: Physical Therapy

## 2020-02-05 VITALS — BP 120/76

## 2020-02-05 DIAGNOSIS — S52532A Colles' fracture of left radius, initial encounter for closed fracture: Secondary | ICD-10-CM | POA: Diagnosis not present

## 2020-02-05 DIAGNOSIS — R29818 Other symptoms and signs involving the nervous system: Secondary | ICD-10-CM

## 2020-02-05 DIAGNOSIS — R293 Abnormal posture: Secondary | ICD-10-CM | POA: Diagnosis not present

## 2020-02-05 DIAGNOSIS — R2689 Other abnormalities of gait and mobility: Secondary | ICD-10-CM

## 2020-02-05 DIAGNOSIS — R2681 Unsteadiness on feet: Secondary | ICD-10-CM | POA: Diagnosis not present

## 2020-02-05 DIAGNOSIS — M6281 Muscle weakness (generalized): Secondary | ICD-10-CM | POA: Diagnosis not present

## 2020-02-05 NOTE — Therapy (Addendum)
[facility] [address] [place], [place], [address] Phone: [phone]   Fax:  [phone]  Physical Therapy Treatment  Patient Details  Name: Gloria Ware MRN: [id number] Date of Birth: 09/07/1946 Referring Provider (PT): Alonza Bogus, DO   Encounter Date: 02/05/2020  PT End of Session - 02/05/20 1523    Visit Number  4    Number of Visits  18    Date for PT Re-Evaluation  99991111   90 day cert for 9 wk POC   Authorization Type  Medicare    Progress Note Due on Visit  10    PT Start Time  1233    PT Stop Time  1316    PT Time Calculation (min)  43 min    Equipment Utilized During Treatment  Gait belt    Activity Tolerance  Patient tolerated treatment well   limited by dizziness   Behavior During Therapy  [facility] for tasks assessed/performed       Past Medical History:  Diagnosis Date  . Anxiety   . Breast CA ([place])   . Colon polyp 09/12/2005  . Depression   . Eczema    right forearm   . Genetic testing 06/27/2017   Multi-Cancer panel (83 genes) @ [facility] - No pathogenic mutations detected  . Hearing aid worn    B/L  . Hearing loss   . High cholesterol   . Injury     left foot metatarsal fracture sustined 08-03-17 , treated with boot; patient  reports  improvement as of today  . OCD (obsessive compulsive disorder)   . Osteopenia 05/2019   T score -2.0 FRAX 32% / 16%  . Personal history of chemotherapy   . Personal history of radiation therapy   . PONV (postoperative nausea and vomiting)    post op nausea; took a long time waking up  . Tinnitus     Past Surgical History:  Procedure Laterality Date  . BREAST SURGERY     Mastectomy-Right  . CARDIAC CATHETERIZATION     husband was having symtpoms and needed a stent; subsequently she states she started " to feel something"  but thinks it was " all in my head bc of my hbsand". subsequently saw same cardiologist as her husband who sent for treadmill  stress that had inconcluisicve results so she was sent for cardiac catherization which she states was " definitely negative "   . cataract surg    . COLONOSCOPY     with polypectomy  . DILATION AND CURETTAGE OF UTERUS    . IR CV LINE INJECTION  09/30/2017  . MASTECTOMY Right   . MASTECTOMY W/ SENTINEL NODE BIOPSY Right 08/06/2017   Procedure: RIGHT MASTECTOMY WITH SENTINEL LYMPH NODE BIOPSY;  Surgeon: [name], MD;  Location: [facility];  Service: General;  Laterality: Right;  . MOUTH SURGERY    . PORT-A-CATH REMOVAL Left 01/20/2018   Procedure: REMOVAL PORT-A-CATH;  Surgeon: [name], MD;  Location: [facility];  Service: General;  Laterality: Left;  . PORTACATH PLACEMENT N/A 09/12/2017   Procedure: INSERTION PORT-A-CATH;  Surgeon: [name], MD;  Location: [facility];  Service: General;  Laterality: N/A;  . RADIOACTIVE SEED GUIDED AXILLARY SENTINEL LYMPH NODE Right 08/06/2017   Procedure: SEED TARGETED AXILLARY LYMPH NODE EXCISION;  Surgeon: [name], MD;  Location: [facility];  Service: General;  Laterality: Right;    Vitals:   02/05/20 1311  BP: 120/76  Subjective Assessment - 02/05/20 1235    Subjective  Went to the orthopedist earlier today- states that her wrist is completely healed. Had an episode of dizziness last night - her husband turned on the light and she turned her head and then she reported she felt the room was spinning - just for a few seconds, then steadied herself and went back to sleep. Exercises are going well.    Pertinent History  PMH includes breast cancer, anxiety, depression, L foot fracture, L wrist fracture from fall in December; OCD, osteopenia    Patient Stated Goals  Pt's goal for therapy is to get onto and out of a chair without arms, getting in and out of the car, walking comfortably and turning.    Currently in Pain?  No/denies    Pain Onset  More than a month ago                02/07/20 0001   Ambulation/Gait  Ambulation/Gait Yes  Ambulation/Gait Assistance 5: Supervision;4: Min guard  Ambulation/Gait Assistance Details cues for cane sequencing and increased step length. At times pt reverts to more shuffled steps, especially when performing a turn. Pt cued to stop and "reset" (tall posture and then continuing with a big step) when pt loses sequencing with SPC. pt with improvement of sequencing with incr reps  Ambulation Distance (Feet) 300 Feet  Assistive device Straight cane (with tripod base)  Gait Pattern Step-through pattern;Decreased arm swing - left;Decreased step length - right;Decreased step length - left;Narrow base of support;Decreased arm swing - right  Ambulation Surface Level;Indoor  High Level Balance  High Level Balance Comments at countertop with BUE support: alternating stepping back posteriorly for stepping strategy x10 reps B, cues for step length and weight shifting   Neuro Re-ed   Neuro Re-ed Details  Pt stating that in the past couple of weeks she has had a couple instances of dizziness that lasts for a couple of seconds when she has rolled/turned over in bed. PT to do a quick screen for BPPV. When pt went from sitting > supine (going down to the R), pt reported the same dizziness sensation, observed what it looked to be L rotary nystagmus that fatigued after approx. 15-20 seconds, with pt reporting the room spinning and then it subsided. Also performed the roll test to the L - with pt having no symptoms or no nystagmus, performed to the R with pt reporting the same spinning sensation and dizziness with what appeared to be geotrophic nystagmus that fatigued in 15-20 seconds. 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 Discussed with pt getting scheduled with a vestibular therapist for further vestibular assessment and treatment, pt verbalized understanding. Therapist spent time looking at vestibular therapist's schedules to get pt in as soon  as possible.                       PT Short Term Goals - 01/12/20 1553      PT SHORT TERM GOAL #1   Title  Pt will be independent with HEP for improved transfers, strength, balance, and gait.  TARGET 03/14/2020 for all STGs    Time  5    Period  Weeks    Status  New      PT SHORT TERM GOAL #2   Title  Pt will improve 5x sit<>stand to less than or equal to 13 seconds (with UE support), for improved transfer efficiency and lower  extremity strength.    Baseline  15.28 sec with UE support    Time  5    Period  Weeks      PT SHORT TERM GOAL #3   Title  Pt will improve TUG score to less than or equal to 18 seconds for decreased fall risk.    Baseline  24.5 sec    Time  5    Period  Weeks      PT SHORT TERM GOAL #4   Title  Pt will improve DGI score to at least 16/24 for decreased fall risk.    Baseline  13/24    Time  5    Period  Weeks    Status  New      PT SHORT TERM GOAL #5   Title  Pt will report at least 50% improvement in car transfers, for improved overall functional mobility.    Time  5    Period  Weeks    Status  New      Additional Short Term Goals   Additional Short Term Goals  Yes      PT SHORT TERM GOAL #6   Title  Pt will verbalize understanding of fall prevention in home environment, including tips to reducing freezing with gait.    Time  5    Period  Weeks    Status  New        PT Long Term Goals - 01/12/20 1556      PT LONG TERM GOAL #1   Title  Pt will be independent with progression of Parkinson's specific exercise program to improve overall functional mobility.  TARGET 9 weeks: 03/11/2020 for all LTGs    Time  9    Period  Weeks    Status  New      PT LONG TERM GOAL #2   Title  Pt will perform at least 4 of 5 reps of sit<>stand from 18" chairs or lower, with minimal to no UE support, no LOB, for improved transfer efficiency and safety.    Time  9    Period  Weeks    Status  New      PT LONG TERM GOAL #3   Title  Pt will  improve TUG score to less than or equal to 13.5 seconds for decreased fall risk.    Baseline  24.5 sec at eval    Time  9    Period  Weeks    Status  New      PT LONG TERM GOAL #4   Title  Pt will improve DGI score to at least 19/24 for decreased fall risk.    Baseline  13/24 at eval    Time  9    Period  Weeks    Status  New      PT LONG TERM GOAL #5   Title  Pt will improve gait velocity to at least 2.3 ft/sec for improved gait efficiency and safety.    Baseline  1.88 ft/sec at eval    Time  9    Period  Weeks    Status  New           02/07/20 1722  Plan  Clinical Impression Statement Pt returns to therapy reporting she had an episode of a spinning sensation last night while turning her head in bed and has felt a little unsteady today. Therapist performed quick BPPV screen, with pt having dizziness/nystagmus with R horizontal roll test and  when transitioning from sit > supine to the R (rotary nystagmus) that fatigued in 15-20 seconds. Findings consistent with BPPV. Discussed with pt and having pt schedule with a vestibular therapist for further evaluation/treatment. Due to pt having a couple instances of these episodes after her initial eval about a month ago, will re-cert to include dizziness and vestibular intervention/canalith repositioning to pt's POC. Also continued to focus on gait training with [facility] today with cues for sequencing and incr step length. When pt is more distracted or performing a turn, pt with tendency to revert more back to shuffled steps. Will continue to progress towards LTGs.  Personal Factors and Comorbidities Comorbidity 3+  Comorbidities PMH includes breast cancer, anxiety, depression, L foot fracture, L wrist fracture from fall in December; OCD, osteopenia  Examination-Activity Limitations Locomotion Level;Transfers;Stairs;Stand;Other (car transfers)  Examination-Participation Restrictions Community Activity;Other (Community exercise and walking in  neighborhood)  Pt will benefit from skilled therapeutic intervention in order to improve on the following deficits Abnormal gait;Difficulty walking;Impaired tone;Decreased balance;Decreased mobility;Decreased strength;Postural dysfunction;Dizziness  Stability/Clinical Decision Making Evolving/Moderate complexity (PD dx in February 2021, not yet started on PD medications)  Rehab Potential Good  PT Frequency 2x / week  PT Duration Other (comment) (9 weeks, including eval week)  PT Treatment/Interventions ADLs/Self Care Home Management;DME Instruction;Neuromuscular re-education;Balance training;Therapeutic exercise;Therapeutic activities;Functional mobility training;Stair training;Gait training;Patient/family education;Vestibular;Canalith Repostioning  PT Next Visit Plan further vestibular assessment/treatment for BPPV. Pt needs to cancel either next Tues/Friday (currently has 3 appts for next week). posterior stepping strategies, continue sit <> stand training, how did car transfers and transfers from recliner go?  further gait training, standing PWR! Moves (at counter for support)  Consulted and Agree with Plan of Care Patient         Patient will benefit from skilled therapeutic intervention in order to improve the following deficits and impairments:     Visit Diagnosis: Other symptoms and signs involving the nervous system  Unsteadiness on feet  Other abnormalities of gait and mobility  Abnormal posture     Problem List Patient Active Problem List   Diagnosis Date Noted  . Parkinson's disease ([place]) 12/30/2019  . Closed fracture of distal end of left radius 09/17/2019  . Closed fracture of styloid process of ulna 09/17/2019  . Insomnia 10/07/2017  . Dehydration 10/07/2017  . Port-A-Cath in place 09/30/2017  . Genetic testing 06/27/2017  . Family history of breast cancer   . Malignant neoplasm of upper-inner quadrant of right breast in female, estrogen receptor positive  ([place]) 06/26/2017    Arliss Journey, PT, DPT  02/05/2020, 3:24 PM  [facility] [address] [place], [place], [address] Phone: [phone]   Fax:  [phone]  Name: Gloria Ware MRN: [id number] Date of Birth: Feb 22, 1946

## 2020-02-07 NOTE — Addendum Note (Signed)
Addended by: Arliss Journey on: 02/07/2020 05:32 PM   Modules accepted: Orders

## 2020-02-08 ENCOUNTER — Ambulatory Visit: Payer: Medicare Other | Attending: Neurology | Admitting: Physical Therapy

## 2020-02-08 ENCOUNTER — Other Ambulatory Visit: Payer: Self-pay

## 2020-02-08 DIAGNOSIS — R2689 Other abnormalities of gait and mobility: Secondary | ICD-10-CM | POA: Diagnosis not present

## 2020-02-08 DIAGNOSIS — R2681 Unsteadiness on feet: Secondary | ICD-10-CM | POA: Insufficient documentation

## 2020-02-08 DIAGNOSIS — R29818 Other symptoms and signs involving the nervous system: Secondary | ICD-10-CM | POA: Diagnosis not present

## 2020-02-08 DIAGNOSIS — M6281 Muscle weakness (generalized): Secondary | ICD-10-CM | POA: Insufficient documentation

## 2020-02-08 NOTE — Therapy (Signed)
[facility] [address] [place], [place], [address] Phone: [phone]   Fax:  [phone]  Physical Therapy Treatment  Patient Details  Name: Gloria Ware MRN: [id number] Date of Birth: March 24, 1946 Referring Provider (PT): Alonza Bogus, DO   Encounter Date: 02/08/2020  PT End of Session - 02/08/20 1156    Visit Number  5    Number of Visits  18    Date for PT Re-Evaluation  99991111   90 day cert for 9 wk POC   Authorization Type  Medicare    Progress Note Due on Visit  10    PT Start Time  1100    PT Stop Time  1145    PT Time Calculation (min)  45 min    Activity Tolerance  Patient tolerated treatment well   limited by dizziness   Behavior During Therapy  [facility] for tasks assessed/performed       Past Medical History:  Diagnosis Date  . Anxiety   . Breast CA ([place])   . Colon polyp 09/12/2005  . Depression   . Eczema    right forearm   . Genetic testing 06/27/2017   Multi-Cancer panel (83 genes) @ [facility] - No pathogenic mutations detected  . Hearing aid worn    B/L  . Hearing loss   . High cholesterol   . Injury     left foot metatarsal fracture sustined 08-03-17 , treated with boot; patient  reports  improvement as of today  . OCD (obsessive compulsive disorder)   . Osteopenia 05/2019   T score -2.0 FRAX 32% / 16%  . Personal history of chemotherapy   . Personal history of radiation therapy   . PONV (postoperative nausea and vomiting)    post op nausea; took a long time waking up  . Tinnitus     Past Surgical History:  Procedure Laterality Date  . BREAST SURGERY     Mastectomy-Right  . CARDIAC CATHETERIZATION     husband was having symtpoms and needed a stent; subsequently she states she started " to feel something"  but thinks it was " all in my head bc of my hbsand". subsequently saw same cardiologist as her husband who sent for treadmill stress that had inconcluisicve results so she was sent  for cardiac catherization which she states was " definitely negative "   . cataract surg    . COLONOSCOPY     with polypectomy  . DILATION AND CURETTAGE OF UTERUS    . IR CV LINE INJECTION  09/30/2017  . MASTECTOMY Right   . MASTECTOMY W/ SENTINEL NODE BIOPSY Right 08/06/2017   Procedure: RIGHT MASTECTOMY WITH SENTINEL LYMPH NODE BIOPSY;  Surgeon: [name], MD;  Location: [facility];  Service: General;  Laterality: Right;  . MOUTH SURGERY    . PORT-A-CATH REMOVAL Left 01/20/2018   Procedure: REMOVAL PORT-A-CATH;  Surgeon: [name], MD;  Location: [facility];  Service: General;  Laterality: Left;  . PORTACATH PLACEMENT N/A 09/12/2017   Procedure: INSERTION PORT-A-CATH;  Surgeon: [name], MD;  [facility];  Service: General;  Laterality: N/A;  . RADIOACTIVE SEED GUIDED AXILLARY SENTINEL LYMPH NODE Right 08/06/2017   Procedure: SEED TARGETED AXILLARY LYMPH NODE EXCISION;  Surgeon: [name], MD;  Location: [facility];  Service: General;  Laterality: Right;    There were no vitals filed for this visit.  Subjective Assessment - 02/08/20 1105    Subjective  Pt  continues to experience spinning vertigo intermittently with turning over in bed.  Also experiences a little dizziness/wooziness in the mornings and wonders if that is related to changes in medication.    Pertinent History  PMH includes breast cancer, anxiety, depression, L foot fracture, L wrist fracture from fall in December; OCD, osteopenia    Patient Stated Goals  Pt's goal for therapy is to get onto and out of a chair without arms, getting in and out of the car, walking comfortably and turning.    Currently in Pain?  No/denies    Pain Onset  More than a month ago             Vestibular Assessment - 02/08/20 1108      Symptom Behavior   Subjective history of current problem  2 types of dizziness - vertigo spinning with rolling in bed and general dizziness/disequilibrium     Type of Dizziness   Spinning;Imbalance;Vertigo    Frequency of Dizziness  2 episodes of spininng; once when she woke up and experienced spinning riding in the car.  Second episode began a couple days ago when in the bed.    Duration of Dizziness  a few seconds    Symptom Nature  Motion provoked;Positional    Aggravating Factors  Lying supine;Supine to sit;Rolling to left;Rolling to right   riding in the car   Relieving Factors  Slow movements    Progression of Symptoms  No change since onset      Oculomotor Exam   Oculomotor Alignment  Normal    Ocular ROM  WFL    Spontaneous  Absent    Gaze-induced   Absent    Smooth Pursuits  Saccades    Saccades  Intact      Oculomotor Exam-Fixation Suppressed    Left Head Impulse  positive    Right Head Impulse  positive      Vestibulo-Ocular Reflex   VOR to Slow Head Movement  Normal   eye tremors   VOR Cancellation  Normal      Positional Testing   Dix-Hallpike  Dix-Hallpike Right;Dix-Hallpike Left    Horizontal Canal Testing  Horizontal Canal Right;Horizontal Canal Left      Dix-Hallpike Right   Dix-Hallpike Right Duration  0    Dix-Hallpike Right Symptoms  No nystagmus      Dix-Hallpike Left   Dix-Hallpike Left Duration  0    Dix-Hallpike Left Symptoms  No nystagmus      Horizontal Canal Right   Horizontal Canal Right Duration  0    Horizontal Canal Right Symptoms  Normal      Horizontal Canal Left   Horizontal Canal Left Duration  0    Horizontal Canal Left Symptoms  Normal                Vestibular Treatment/Exercise - 02/08/20 1133      Vestibular Treatment/Exercise   Vestibular Treatment Provided  Gaze    Gaze Exercises  X1 Viewing Horizontal;X1 Viewing Vertical      X1 Viewing Horizontal   Foot Position  supported sitting    Reps  2    Comments  30 seconds with cues for technique      X1 Viewing Vertical   Foot Position  supported sitting    Reps  2    Comments  30 seconds with verbal cues for  technique       Gaze Stabilization: Sitting    Keeping eyes on target on wall  3 feet away, and move head side to side for _30__ seconds. Repeat while moving head up and down for __30_ seconds. Do __2-3__ sessions per day.  Copyright  VHI. All rights reserved.   Gaze Stabilization: Tip Card  1.Target must remain in focus, not blurry, and appear stationary while head is in motion. 2.Perform exercises with small head movements (45 to either side of midline). 3.Increase speed of head motion so long as target is in focus. 4.If you wear eyeglasses, be sure you can see target through lens (therapist will give specific instructions for bifocal / progressive lenses). 5.These exercises may provoke dizziness or nausea. Work through these symptoms. If too dizzy, slow head movement slightly. Rest between each exercise. 6.Exercises demand concentration; avoid distractions.  Copyright  VHI. All rights reserved.       PT Education - 02/08/20 1154    Education Details  Resolution of BPPV; impaired VOR - provided pt with x1 viewing in sitting    Person(s) Educated  Patient    Methods  Explanation;Handout    Comprehension  Verbalized understanding;Returned demonstration       PT Short Term Goals - 01/12/20 1553      PT SHORT TERM GOAL #1   Title  Pt will be independent with HEP for improved transfers, strength, balance, and gait.  TARGET 03/14/2020 for all STGs    Time  5    Period  Weeks    Status  New      PT SHORT TERM GOAL #2   Title  Pt will improve 5x sit<>stand to less than or equal to 13 seconds (with UE support), for improved transfer efficiency and lower extremity strength.    Baseline  15.28 sec with UE support    Time  5    Period  Weeks      PT SHORT TERM GOAL #3   Title  Pt will improve TUG score to less than or equal to 18 seconds for decreased fall risk.    Baseline  24.5 sec    Time  5    Period  Weeks      PT SHORT TERM GOAL #4   Title  Pt will improve DGI  score to at least 16/24 for decreased fall risk.    Baseline  13/24    Time  5    Period  Weeks    Status  New      PT SHORT TERM GOAL #5   Title  Pt will report at least 50% improvement in car transfers, for improved overall functional mobility.    Time  5    Period  Weeks    Status  New      Additional Short Term Goals   Additional Short Term Goals  Yes      PT SHORT TERM GOAL #6   Title  Pt will verbalize understanding of fall prevention in home environment, including tips to reducing freezing with gait.    Time  5    Period  Weeks    Status  New        PT Long Term Goals - 01/12/20 1556      PT LONG TERM GOAL #1   Title  Pt will be independent with progression of Parkinson's specific exercise program to improve overall functional mobility.  TARGET 9 weeks: 03/11/2020 for all LTGs    Time  9    Period  Weeks    Status  New  PT LONG TERM GOAL #2   Title  Pt will perform at least 4 of 5 reps of sit<>stand from 18" chairs or lower, with minimal to no UE support, no LOB, for improved transfer efficiency and safety.    Time  9    Period  Weeks    Status  New      PT LONG TERM GOAL #3   Title  Pt will improve TUG score to less than or equal to 13.5 seconds for decreased fall risk.    Baseline  24.5 sec at eval    Time  9    Period  Weeks    Status  New      PT LONG TERM GOAL #4   Title  Pt will improve DGI score to at least 19/24 for decreased fall risk.    Baseline  13/24 at eval    Time  9    Period  Weeks    Status  New      PT LONG TERM GOAL #5   Title  Pt will improve gait velocity to at least 2.3 ft/sec for improved gait efficiency and safety.    Baseline  1.88 ft/sec at eval    Time  9    Period  Weeks    Status  New            Plan - 02/08/20 1156    Clinical Impression Statement  Pt was negative for vertigo and nystagmus during positional testing today indicating spontaneous resolution of BPPV.  Pt does present with impaired VOR bilaterally  and motion sensitivity.  Provided pt with x1 viewing for gaze adaptation for HEP.  Will continue to monitor and will reassess and treat for BPPV if symptoms return.    Personal Factors and Comorbidities  Comorbidity 3+    Comorbidities  PMH includes breast cancer, anxiety, depression, L foot fracture, L wrist fracture from fall in December; OCD, osteopenia    Examination-Activity Limitations  Locomotion Level;Transfers;Stairs;Stand;Other   car transfers   Examination-Participation Restrictions  Community Activity;Other   Community exercise and walking in neighborhood   Stability/Clinical Decision Making  Evolving/Moderate complexity   PD dx in February 2021, not yet started on PD medications   Rehab Potential  Good    PT Frequency  2x / week    PT Duration  Other (comment)   9 weeks, including eval week   PT Treatment/Interventions  ADLs/Self Care Home Management;DME Instruction;Neuromuscular re-education;Balance training;Therapeutic exercise;Therapeutic activities;Functional mobility training;Stair training;Gait training;Patient/family education;Vestibular;Canalith Repostioning    PT Next Visit Plan  Progress x1 as able.  Let me know if spinning returns and we can re-evaluate;  posterior stepping strategies, continue sit <> stand training, how did car transfers and transfers from recliner go?  further gait training, standing PWR! Moves (at counter for support)    Consulted and Agree with Plan of Care  Patient       Patient will benefit from skilled therapeutic intervention in order to improve the following deficits and impairments:  Abnormal gait, Difficulty walking, Impaired tone, Decreased balance, Decreased mobility, Decreased strength, Postural dysfunction, Dizziness  Visit Diagnosis: Unsteadiness on feet  Other symptoms and signs involving the nervous system     Problem List Patient Active Problem List   Diagnosis Date Noted  . Parkinson's disease ([place]) 12/30/2019  . Closed  fracture of distal end of left radius 09/17/2019  . Closed fracture of styloid process of ulna 09/17/2019  . Insomnia 10/07/2017  . Dehydration 10/07/2017  .  Port-A-Cath in place 09/30/2017  . Genetic testing 06/27/2017  . Family history of breast cancer   . Malignant neoplasm of upper-inner quadrant of right breast in female, estrogen receptor positive ([place]) 06/26/2017    Rico Junker, PT, DPT 02/08/20    12:00 PM    [facility] [address] [place], [place], [address] Phone: [phone]   Fax:  [phone]  Name: Gloria Ware MRN: [id number] Date of Birth: [birth year]

## 2020-02-08 NOTE — Patient Instructions (Signed)
Gaze Stabilization: Sitting    Keeping eyes on target on wall 3 feet away, and move head side to side for _30__ seconds. Repeat while moving head up and down for __30_ seconds. Do __2-3__ sessions per day.  Copyright  VHI. All rights reserved.   Gaze Stabilization: Tip Card  1.Target must remain in focus, not blurry, and appear stationary while head is in motion. 2.Perform exercises with small head movements (45 to either side of midline). 3.Increase speed of head motion so long as target is in focus. 4.If you wear eyeglasses, be sure you can see target through lens (therapist will give specific instructions for bifocal / progressive lenses). 5.These exercises may provoke dizziness or nausea. Work through these symptoms. If too dizzy, slow head movement slightly. Rest between each exercise. 6.Exercises demand concentration; avoid distractions.  Copyright  VHI. All rights reserved.      Sit to Stand Transfers:  1. Scoot out to the edge of the chair  (You can do this by ROCKING side to side through your hips, then ROCK and bring one leg forward at a time, until your feet are on the floor) 2. Place your feet flat on the floor, shoulder width apart.  Make sure your feet are tucked just under your knees. 3. Lean forward (nose over toes) with momentum, (You can ROCK forward and back several times) and then stand up tall with your best posture.  If you need to use your arms, use them as a quick boost up to stand. 4. If you are in a low or soft chair, you can lean back and then forward up to stand, in order to get more momentum. 5. Once you are standing, make sure you are looking ahead and standing tall.  To sit down:  1. Back up until you feel the chair behind your legs. 2. Bend at your hips, reaching  Back for you chair, if needed, then slowly squat to sit down on your chair.

## 2020-02-09 ENCOUNTER — Encounter: Payer: Self-pay | Admitting: Physical Therapy

## 2020-02-09 ENCOUNTER — Ambulatory Visit: Payer: Medicare Other | Admitting: Physical Therapy

## 2020-02-09 DIAGNOSIS — R29818 Other symptoms and signs involving the nervous system: Secondary | ICD-10-CM | POA: Diagnosis not present

## 2020-02-09 DIAGNOSIS — R2689 Other abnormalities of gait and mobility: Secondary | ICD-10-CM | POA: Diagnosis not present

## 2020-02-09 DIAGNOSIS — R2681 Unsteadiness on feet: Secondary | ICD-10-CM

## 2020-02-09 DIAGNOSIS — F411 Generalized anxiety disorder: Secondary | ICD-10-CM | POA: Diagnosis not present

## 2020-02-09 DIAGNOSIS — M6281 Muscle weakness (generalized): Secondary | ICD-10-CM | POA: Diagnosis not present

## 2020-02-09 NOTE — Therapy (Signed)
[facility] [address] [place], [place], [address] Phone: [phone]   Fax:  [phone]  Physical Therapy Treatment  Patient Details  Name: Gloria Ware MRN: [id number] Date of Birth: 07-03-1946 Referring Provider (PT): Alonza Bogus, DO   Encounter Date: 02/09/2020  PT End of Session - 02/09/20 1236    Visit Number  6    Number of Visits  18    Date for PT Re-Evaluation  [id number]   90 day cert for 9 wk POC   Authorization Type  Medicare    Progress Note Due on Visit  10    PT Start Time  [id number]    PT Stop Time  1313    PT Time Calculation (min)  38 min    Activity Tolerance  Patient tolerated treatment well   no c/o dizziness today   Behavior During Therapy  [facility] for tasks assessed/performed       Past Medical History:  Diagnosis Date  . Anxiety   . Breast CA ([place])   . Colon polyp 09/12/2005  . Depression   . Eczema    right forearm   . Genetic testing 06/27/2017   Multi-Cancer panel (83 genes) @ [facility] - No pathogenic mutations detected  . Hearing aid worn    B/L  . Hearing loss   . High cholesterol   . Injury     left foot metatarsal fracture sustined 08-03-17 , treated with boot; patient  reports  improvement as of today  . OCD (obsessive compulsive disorder)   . Osteopenia 05/2019   T score -2.0 FRAX 32% / 16%  . Personal history of chemotherapy   . Personal history of radiation therapy   . PONV (postoperative nausea and vomiting)    post op nausea; took a long time waking up  . Tinnitus     Past Surgical History:  Procedure Laterality Date  . BREAST SURGERY     Mastectomy-Right  . CARDIAC CATHETERIZATION     husband was having symtpoms and needed a stent; subsequently she states she started " to feel something"  but thinks it was " all in my head bc of my hbsand". subsequently saw same cardiologist as her husband who sent for treadmill stress that had inconcluisicve results so she was  sent for cardiac catherization which she states was " definitely negative "   . cataract surg    . COLONOSCOPY     with polypectomy  . DILATION AND CURETTAGE OF UTERUS    . IR CV LINE INJECTION  09/30/2017  . MASTECTOMY Right   . MASTECTOMY W/ SENTINEL NODE BIOPSY Right 08/06/2017   Procedure: RIGHT MASTECTOMY WITH SENTINEL LYMPH NODE BIOPSY;  Surgeon: [name], MD;  Location: [facility];  Service: General;  Laterality: Right;  . MOUTH SURGERY    . PORT-A-CATH REMOVAL Left 01/20/2018   Procedure: REMOVAL PORT-A-CATH;  Surgeon: [name], MD;  Location: [facility];  Service: General;  Laterality: Left;  . PORTACATH PLACEMENT N/A 09/12/2017   Procedure: INSERTION PORT-A-CATH;  Surgeon: [name], MD;  Location: [facility];  Service: General;  Laterality: N/A;  . RADIOACTIVE SEED GUIDED AXILLARY SENTINEL LYMPH NODE Right 08/06/2017   Procedure: SEED TARGETED AXILLARY LYMPH NODE EXCISION;  Surgeon: [name], MD;  Location: [facility];  Service: General;  Laterality: Right;    There were no vitals filed for this visit.  Subjective Assessment - 02/09/20 1234    Subjective  Feel much better.  She gave me the exercises yesterday and haven't had a chance to do them yet.  Increaseing dosage of Sinemet this week-a little dizziness noted with that.  No spinning noted with bed mobility today.    Pertinent History  PMH includes breast cancer, anxiety, depression, L foot fracture, L wrist fracture from fall in December; OCD, osteopenia    Patient Stated Goals  Pt's goal for therapy is to get onto and out of a chair without arms, getting in and out of the car, walking comfortably and turning.    Currently in Pain?  No/denies    Pain Onset  More than a month ago                       [facility] Adult PT Treatment/Exercise - 02/09/20 0001      Transfers   Transfers  Sit to Stand;Stand to Sit    Sit to Stand  With upper extremity assist;From  chair/3-in-1;5: Supervision;From bed    Sit to Stand Details  Verbal cues for sequencing;Verbal cues for technique;Visual cues/gestures for sequencing    Stand to Sit  With upper extremity assist;To chair/3-in-1;5: Supervision;To bed      Ambulation/Gait   Ambulation/Gait  Yes    Ambulation/Gait Assistance  5: Supervision;4: Min guard    Ambulation/Gait Assistance Details  Cues for cane placement, increased L step length, then also for increased R step length.  Pt continues to have decreased L arm swing.  Pt needs reminder cues for sequencing, especially to place cane wider out of BOS to prevent catching R foot on cane.    Ambulation Distance (Feet)  230 Feet   115 ft x 2, 80 ft   Assistive device  Straight cane   tripod base (HurryCane)   Gait Pattern  Step-through pattern;Decreased arm swing - left;Decreased step length - right;Decreased step length - left;Narrow base of support;Decreased arm swing - right    Ambulation Surface  Level;Indoor      Neuro Re-ed    Neuro Re-ed Details   At counter, cane and L foot forward step and weightshift, focus on increased step length and increased RLE push-off simulating gait, x 10 reps.  Then RLE forward step and weightshfit with focus on increased R step length and increased L push-off simulating gait, x 10 reps.  Stagger stance forward/back rocking at counter with BUE support, x 10 reps each position.      Vestibular Treatment/Exercise - 02/09/20 0001      Vestibular Treatment/Exercise   Vestibular Treatment Provided  Gaze    Gaze Exercises  X1 Viewing Horizontal;X1 Viewing Vertical      X1 Viewing Horizontal   Foot Position  supported sitting    Reps  2    Comments  30 seconds with cues for technique      X1 Viewing Vertical   Foot Position  supported sitting    Reps  2    Comments  30 seconds with verbal cues for technique     Reviewed x1 viewing above; pt has difficulty with keeping eyes focused on target, needs cues for correct  technique.  Pt performs better on 2nd set, when she moves chair about 1 ft closer to target on wall.  Pt performs PWR! Moves in sitting position    PWR! Up for improved posture x 10 reps  PWR! Rock for improved weighshifting x 5 reps each side  PWR! Step for improved step initiation x 10  reps, single step out and in (attempted to start with double step out and in, with difficulty).  Pt responds well to cues for high and wide stepping.  Cues provided for technique and to try to use a chair without arms at home.        PT Education - 02/09/20 1449    Education Details  instructions to have target for x1 viewing slightly closer in sitting    Person(s) Educated  Patient    Methods  Explanation;Demonstration;Verbal cues;Handout    Comprehension  Verbalized understanding       PT Short Term Goals - 02/09/20 1454      PT SHORT TERM GOAL #1   Title  Pt will be independent with HEP for improved transfers, strength, balance, and gait.  TARGET 02/12/2020 for all STGs    Time  5    Period  Weeks    Status  New      PT SHORT TERM GOAL #2   Title  Pt will improve 5x sit<>stand to less than or equal to 13 seconds (with UE support), for improved transfer efficiency and lower extremity strength.    Baseline  15.28 sec with UE support    Time  5    Period  Weeks      PT SHORT TERM GOAL #3   Title  Pt will improve TUG score to less than or equal to 18 seconds for decreased fall risk.    Baseline  24.5 sec    Time  5    Period  Weeks      PT SHORT TERM GOAL #4   Title  Pt will improve DGI score to at least 16/24 for decreased fall risk.    Baseline  13/24    Time  5    Period  Weeks    Status  New      PT SHORT TERM GOAL #5   Title  Pt will report at least 50% improvement in car transfers, for improved overall functional mobility.    Time  5    Period  Weeks    Status  New      PT SHORT TERM GOAL #6   Title  Pt will verbalize understanding of fall prevention in home environment,  including tips to reducing freezing with gait.    Time  5    Period  Weeks    Status  New        PT Long Term Goals - 01/12/20 1556      PT LONG TERM GOAL #1   Title  Pt will be independent with progression of Parkinson's specific exercise program to improve overall functional mobility.  TARGET 9 weeks: 03/11/2020 for all LTGs    Time  9    Period  Weeks    Status  New      PT LONG TERM GOAL #2   Title  Pt will perform at least 4 of 5 reps of sit<>stand from 18" chairs or lower, with minimal to no UE support, no LOB, for improved transfer efficiency and safety.    Time  9    Period  Weeks    Status  New      PT LONG TERM GOAL #3   Title  Pt will improve TUG score to less than or equal to 13.5 seconds for decreased fall risk.    Baseline  24.5 sec at eval    Time  9    Period  Weeks  Status  New      PT LONG TERM GOAL #4   Title  Pt will improve DGI score to at least 19/24 for decreased fall risk.    Baseline  13/24 at eval    Time  9    Period  Weeks    Status  New      PT LONG TERM GOAL #5   Title  Pt will improve gait velocity to at least 2.3 ft/sec for improved gait efficiency and safety.    Baseline  1.88 ft/sec at eval    Time  9    Period  Weeks    Status  New            Plan - 02/09/20 1452    Clinical Impression Statement  Pt reports feeling better overall; in review of x1 viewing today, pt does need several cues for correct technique.  She does not c/o dizziness with standing or gait activities today.  Pt appears to be doing better with cane sequence, but she will continue to need work to help with LUE arm swing and increased R step length.    Personal Factors and Comorbidities  Comorbidity 3+    Comorbidities  PMH includes breast cancer, anxiety, depression, L foot fracture, L wrist fracture from fall in December; OCD, osteopenia    Examination-Activity Limitations  Locomotion Level;Transfers;Stairs;Stand;Other   car transfers    Examination-Participation Restrictions  Community Activity;Other   Community exercise and walking in neighborhood   Stability/Clinical Decision Making  Evolving/Moderate complexity   PD dx in February 2021, not yet started on PD medications   Rehab Potential  Good    PT Frequency  2x / week    PT Duration  Other (comment)   9 weeks, including eval week   PT Treatment/Interventions  ADLs/Self Care Home Management;DME Instruction;Neuromuscular re-education;Balance training;Therapeutic exercise;Therapeutic activities;Functional mobility training;Stair training;Gait training;Patient/family education;Vestibular;Canalith Repostioning    PT Next Visit Plan  Check STGS; Progress x1 as able.  Review again x1 viewing; If spinning returns,we can re-evaluate;  posterior stepping strategies, continue sit <> stand training, further gait training-activities to increase arm swing, standing PWR! Moves (at counter for support)    Consulted and Agree with Plan of Care  Patient       Patient will benefit from skilled therapeutic intervention in order to improve the following deficits and impairments:  Abnormal gait, Difficulty walking, Impaired tone, Decreased balance, Decreased mobility, Decreased strength, Postural dysfunction, Dizziness  Visit Diagnosis: Other abnormalities of gait and mobility  Unsteadiness on feet  Other symptoms and signs involving the nervous system     Problem List Patient Active Problem List   Diagnosis Date Noted  . Parkinson's disease ([place]) 12/30/2019  . Closed fracture of distal end of left radius 09/17/2019  . Closed fracture of styloid process of ulna 09/17/2019  . Insomnia 10/07/2017  . Dehydration 10/07/2017  . Port-A-Cath in place 09/30/2017  . Genetic testing 06/27/2017  . Family history of breast cancer   . Malignant neoplasm of upper-inner quadrant of right breast in female, estrogen receptor positive ([place]) 06/26/2017    Schuyler Behan W. 02/09/2020, 2:57 PM   Frazier Butt., PT   [facility] [address] [place], [place], [address] Phone: [phone]   Fax:  [phone]  Name: Gloria Ware MRN: [id number] Date of Birth: 11/04/1945

## 2020-02-09 NOTE — Patient Instructions (Signed)
Gaze Stabilization: Sitting    Keeping eyes on target on wall 3 feet away (*start with ARM'S LENGTH AWAY-1-2 ft), and move head side to side for _30__ seconds. Repeat while moving head up and down for __30_ seconds. Do __2-3__ sessions per day.  Copyright  VHI. All rights reserved.   Gaze Stabilization: Tip Card  1.Target must remain in focus, not blurry, and appear stationary while head is in motion. 2.Perform exercises with small head movements (45 to either side of midline). 3.Increase speed of head motion so long as target is in focus. 4.If you wear eyeglasses, be sure you can see target through lens (therapist will give specific instructions for bifocal / progressive lenses). 5.These exercises may provoke dizziness or nausea. Work through these symptoms. If too dizzy, slow head movement slightly. Rest between each exercise. 6.Exercises demand concentration; avoid distractions.

## 2020-02-12 ENCOUNTER — Ambulatory Visit: Payer: Medicare Other | Admitting: Physical Therapy

## 2020-02-16 ENCOUNTER — Other Ambulatory Visit: Payer: Self-pay

## 2020-02-16 ENCOUNTER — Ambulatory Visit: Payer: Medicare Other | Admitting: Physical Therapy

## 2020-02-16 DIAGNOSIS — R29818 Other symptoms and signs involving the nervous system: Secondary | ICD-10-CM

## 2020-02-16 DIAGNOSIS — R2681 Unsteadiness on feet: Secondary | ICD-10-CM | POA: Diagnosis not present

## 2020-02-16 DIAGNOSIS — R2689 Other abnormalities of gait and mobility: Secondary | ICD-10-CM

## 2020-02-16 DIAGNOSIS — F411 Generalized anxiety disorder: Secondary | ICD-10-CM | POA: Diagnosis not present

## 2020-02-16 DIAGNOSIS — M6281 Muscle weakness (generalized): Secondary | ICD-10-CM | POA: Diagnosis not present

## 2020-02-16 NOTE — Therapy (Signed)
[facility] [address] [place], [place], [address] Phone: [phone]   Fax:  [phone]  Physical Therapy Treatment  Patient Details  Name: Gloria Ware MRN: [id number] Date of Birth: 1945/11/27 Referring Provider (PT): Alonza Bogus, DO   Encounter Date: 02/16/2020  PT End of Session - 02/16/20 1307    Visit Number  7    Number of Visits  18    Date for PT Re-Evaluation  99991111   90 day cert for 9 wk POC   Authorization Type  Medicare    Progress Note Due on Visit  10    PT Start Time  1103    PT Stop Time  1144    PT Time Calculation (min)  41 min    Activity Tolerance  Patient tolerated treatment well   no c/o dizziness today   Behavior During Therapy  [facility] for tasks assessed/performed       Past Medical History:  Diagnosis Date  . Anxiety   . Breast CA ([place])   . Colon polyp 09/12/2005  . Depression   . Eczema    right forearm   . Genetic testing 06/27/2017   Multi-Cancer panel (83 genes) @ [facility] - No pathogenic mutations detected  . Hearing aid worn    B/L  . Hearing loss   . High cholesterol   . Injury     left foot metatarsal fracture sustined 08-03-17 , treated with boot; patient  reports  improvement as of today  . OCD (obsessive compulsive disorder)   . Osteopenia 05/2019   T score -2.0 FRAX 32% / 16%  . Personal history of chemotherapy   . Personal history of radiation therapy   . PONV (postoperative nausea and vomiting)    post op nausea; took a long time waking up  . Tinnitus     Past Surgical History:  Procedure Laterality Date  . BREAST SURGERY     Mastectomy-Right  . CARDIAC CATHETERIZATION     husband was having symtpoms and needed a stent; subsequently she states she started " to feel something"  but thinks it was " all in my head bc of my hbsand". subsequently saw same cardiologist as her husband who sent for treadmill stress that had inconcluisicve results so she was  sent for cardiac catherization which she states was " definitely negative "   . cataract surg    . COLONOSCOPY     with polypectomy  . DILATION AND CURETTAGE OF UTERUS    . IR CV LINE INJECTION  09/30/2017  . MASTECTOMY Right   . MASTECTOMY W/ SENTINEL NODE BIOPSY Right 08/06/2017   Procedure: RIGHT MASTECTOMY WITH SENTINEL LYMPH NODE BIOPSY;  Surgeon: [name], MD;  Location: [facility];  Service: General;  Laterality: Right;  . MOUTH SURGERY    . PORT-A-CATH REMOVAL Left 01/20/2018   Procedure: REMOVAL PORT-A-CATH;  Surgeon: [name], MD;  Location: [facility];  Service: General;  Laterality: Left;  . PORTACATH PLACEMENT N/A 09/12/2017   Procedure: INSERTION PORT-A-CATH;  Surgeon: [name], MD;  Location: [facility];  Service: General;  Laterality: N/A;  . RADIOACTIVE SEED GUIDED AXILLARY SENTINEL LYMPH NODE Right 08/06/2017   Procedure: SEED TARGETED AXILLARY LYMPH NODE EXCISION;  Surgeon: [name], MD;  Location: [facility];  Service: General;  Laterality: Right;    There were no vitals filed for this visit.  Subjective Assessment - 02/16/20 1105    Subjective  Feel so much better.  On the full dose of the medication now, and I just feel stronger.    Pertinent History  PMH includes breast cancer, anxiety, depression, L foot fracture, L wrist fracture from fall in December; OCD, osteopenia    Patient Stated Goals  Pt's goal for therapy is to get onto and out of a chair without arms, getting in and out of the car, walking comfortably and turning.    Currently in Pain?  No/denies    Pain Onset  More than a month ago          Pt performs PWR! Moves in seated position x 10 reps   PWR! Up for improved posture  PWR! Rock for improved weighshifting-cues to initiate with rocking through hips  PWR! Step for improved step initiation -single step out and in x 5 reps, then double leg step out and in, 3 reps  Cues provided for technique and  intensity Discussed ways that above exercises fit into daily activities.  Standing with chair in front for support as needed:  Pt performs PWR! Moves in standing position    PWR! Up for improved posture x 3 reps (pt c/o lightheadedness-see BP measures under self care)  PWR! Rock for improved weighshifting x 10 reps (through hips only)  PWR! Step for improved step initiation x 5 reps each leg-side step and weightshift, then 5 reps each leg forward step and weightshift  Cues provided for technique and weightshifting                 [facility] Adult PT Treatment/Exercise - 02/16/20 0001      Self-Care   Self-Care  Other Self-Care Comments    Other Self-Care Comments   Pt has questions about Carbi-dopa Levodopa (off-times versus decreased efficiency of medication over time).  Explained that patient should follow instructions for medication given by Dr. Carles Collet (every 4 hours during the day, which patient is doing).  Explained mechanism and reasoning behind this as well as benefits to patient discussing any symptom changes over time so that MD can help with medication management.  Disucssed not eating protein meal with Sinemet, and pt is aware/verbalizes understnading.  Assessed pt's BP measures, as she describes several episodes of lightheadedness upon change of positions from sit>stand.  Sitting BP 108/72; standing 90/60.  Discussed BP measures, changes, and ways to offset lightheadedness (drink plenty of fluids, slowly transition change of positions).  PT to check full orthostatic measures next visit and forward to Dr. Carles Collet.      Vestibular Treatment/Exercise - 02/16/20 0001      Vestibular Treatment/Exercise   Vestibular Treatment Provided  Gaze    Gaze Exercises  X1 Viewing Horizontal;X1 Viewing Vertical      X1 Viewing Horizontal   Foot Position  supported sitting, standing with UE support at chair    Reps  1   each position   Comments  30 seconds with cues for technique       X1 Viewing Vertical   Foot Position  supported sitting; standing with chair UE support    Reps  1   each position   Comments  30 seconds with verbal cues for technique            PT Education - 02/16/20 1306    Education Details  See self-care-discussed medication questions and BP measures    Person(s) Educated  Patient    Methods  Explanation    Comprehension  Verbalized understanding  PT Short Term Goals - 02/09/20 1454      PT SHORT TERM GOAL #1   Title  Pt will be independent with HEP for improved transfers, strength, balance, and gait.  TARGET 02/12/2020 for all STGs    Time  5    Period  Weeks    Status  New      PT SHORT TERM GOAL #2   Title  Pt will improve 5x sit<>stand to less than or equal to 13 seconds (with UE support), for improved transfer efficiency and lower extremity strength.    Baseline  15.28 sec with UE support    Time  5    Period  Weeks      PT SHORT TERM GOAL #3   Title  Pt will improve TUG score to less than or equal to 18 seconds for decreased fall risk.    Baseline  24.5 sec    Time  5    Period  Weeks      PT SHORT TERM GOAL #4   Title  Pt will improve DGI score to at least 16/24 for decreased fall risk.    Baseline  13/24    Time  5    Period  Weeks    Status  New      PT SHORT TERM GOAL #5   Title  Pt will report at least 50% improvement in car transfers, for improved overall functional mobility.    Time  5    Period  Weeks    Status  New      PT SHORT TERM GOAL #6   Title  Pt will verbalize understanding of fall prevention in home environment, including tips to reducing freezing with gait.    Time  5    Period  Weeks    Status  New        PT Long Term Goals - 01/12/20 1556      PT LONG TERM GOAL #1   Title  Pt will be independent with progression of Parkinson's specific exercise program to improve overall functional mobility.  TARGET 9 weeks: 03/11/2020 for all LTGs    Time  9    Period  Weeks    Status  New       PT LONG TERM GOAL #2   Title  Pt will perform at least 4 of 5 reps of sit<>stand from 18" chairs or lower, with minimal to no UE support, no LOB, for improved transfer efficiency and safety.    Time  9    Period  Weeks    Status  New      PT LONG TERM GOAL #3   Title  Pt will improve TUG score to less than or equal to 13.5 seconds for decreased fall risk.    Baseline  24.5 sec at eval    Time  9    Period  Weeks    Status  New      PT LONG TERM GOAL #4   Title  Pt will improve DGI score to at least 19/24 for decreased fall risk.    Baseline  13/24 at eval    Time  9    Period  Weeks    Status  New      PT LONG TERM GOAL #5   Title  Pt will improve gait velocity to at least 2.3 ft/sec for improved gait efficiency and safety.    Baseline  1.88 ft/sec at eval  Time  9    Period  Weeks    Status  New            Plan - 02/16/20 1236    Clinical Impression Statement  Pt overall continues to report feeling better and stronger-thinks it is a combination of therapy and full medication dosage of Sinemet.  Noted improved performance of seated PWR! Moves; attempted to work on performing standing PWR! Moves with possible addition to HEP; however, pt experiences some lightheadedness (with lower standing BP noted today).  Therefore, will need more practice with standing PWR! Moves prior to adding to HEP.    Personal Factors and Comorbidities  Comorbidity 3+    Comorbidities  PMH includes breast cancer, anxiety, depression, L foot fracture, L wrist fracture from fall in December; OCD, osteopenia    Examination-Activity Limitations  Locomotion Level;Transfers;Stairs;Stand;Other   car transfers   Examination-Participation Restrictions  Community Activity;Other   Community exercise and walking in neighborhood   Stability/Clinical Decision Making  Evolving/Moderate complexity   PD dx in February 2021, not yet started on PD medications   Rehab Potential  Good    PT Frequency  2x / week     PT Duration  Other (comment)   9 weeks, including eval week   PT Treatment/Interventions  ADLs/Self Care Home Management;DME Instruction;Neuromuscular re-education;Balance training;Therapeutic exercise;Therapeutic activities;Functional mobility training;Stair training;Gait training;Patient/family education;Vestibular;Canalith Repostioning    PT Next Visit Plan  Check STGS; Progress x1 as able.  Assess orthostatic BP; posterior stepping strategies, continue sit <> stand training, further gait training-activities to increase arm swing, standing PWR! Moves (at counter for support)    Consulted and Agree with Plan of Care  Patient       Patient will benefit from skilled therapeutic intervention in order to improve the following deficits and impairments:  Abnormal gait, Difficulty walking, Impaired tone, Decreased balance, Decreased mobility, Decreased strength, Postural dysfunction, Dizziness  Visit Diagnosis: Other abnormalities of gait and mobility  Unsteadiness on feet  Other symptoms and signs involving the nervous system     Problem List Patient Active Problem List   Diagnosis Date Noted  . Parkinson's disease ([place]) 12/30/2019  . Closed fracture of distal end of left radius 09/17/2019  . Closed fracture of styloid process of ulna 09/17/2019  . Insomnia 10/07/2017  . Dehydration 10/07/2017  . Port-A-Cath in place 09/30/2017  . Genetic testing 06/27/2017  . Family history of breast cancer   . Malignant neoplasm of upper-inner quadrant of right breast in female, estrogen receptor positive ([place]) 06/26/2017    Hoa Deriso W. 02/16/2020, 1:10 PM Frazier Butt., PT  [facility] [address] [place], [place], [address] Phone: [phone]   Fax:  [phone]  Name: Gloria Ware MRN: [id number] Date of Birth: 1946/08/08

## 2020-02-19 ENCOUNTER — Telehealth: Payer: Self-pay | Admitting: Neurology

## 2020-02-19 ENCOUNTER — Other Ambulatory Visit: Payer: Self-pay

## 2020-02-19 ENCOUNTER — Encounter: Payer: Self-pay | Admitting: Physical Therapy

## 2020-02-19 ENCOUNTER — Ambulatory Visit: Payer: Medicare Other | Admitting: Physical Therapy

## 2020-02-19 DIAGNOSIS — R2681 Unsteadiness on feet: Secondary | ICD-10-CM | POA: Diagnosis not present

## 2020-02-19 DIAGNOSIS — R29818 Other symptoms and signs involving the nervous system: Secondary | ICD-10-CM | POA: Diagnosis not present

## 2020-02-19 DIAGNOSIS — R2689 Other abnormalities of gait and mobility: Secondary | ICD-10-CM | POA: Diagnosis not present

## 2020-02-19 DIAGNOSIS — M6281 Muscle weakness (generalized): Secondary | ICD-10-CM | POA: Diagnosis not present

## 2020-02-19 NOTE — Telephone Encounter (Signed)
Left message for patient to contact office.

## 2020-02-19 NOTE — Telephone Encounter (Signed)
Got email from neurorehab that pt somewhat dizzy and orthostatic.  Numbers as follows:  Supine after 5 minutes: 120/70  Standing after 1 minute: 94/60 c/o slight dizziness (not spinning)  Standing after 3 minutes: 90/64   Find out if patient drinking plenty of water.  Like 60+ oz per day.  Also, give her RX for an abdominal compression binder to wear when up and see if that helps.  DX:  Neurogenic Orthostatic Hypotension.  She will need to get that at medical supply store.  I will sign if you print it out

## 2020-02-19 NOTE — Therapy (Signed)
[facility] [address] [place], [place], [address] Phone: [phone]   Fax:  [phone]  Physical Therapy Treatment  Patient Details  Name: Gloria Ware MRN: [id number] Date of Birth: [birth year] Referring Provider (PT): Alonza Bogus, DO   Encounter Date: 02/19/2020  PT End of Session - 02/19/20 1202    Visit Number  8    Number of Visits  18    Date for PT Re-Evaluation  99991111   90 day cert for 9 wk POC   Authorization Type  Medicare    Progress Note Due on Visit  10    PT Start Time  1106    PT Stop Time  1144    PT Time Calculation (min)  38 min    Activity Tolerance  Patient tolerated treatment well   no c/o dizziness today   Behavior During Therapy  [facility] for tasks assessed/performed       Past Medical History:  Diagnosis Date  . Anxiety   . Breast CA ([place])   . Colon polyp 09/12/2005  . Depression   . Eczema    right forearm   . Genetic testing 06/27/2017   Multi-Cancer panel (83 genes) @ [facility] - No pathogenic mutations detected  . Hearing aid worn    B/L  . Hearing loss   . High cholesterol   . Injury     left foot metatarsal fracture sustined 08-03-17 , treated with boot; patient  reports  improvement as of today  . OCD (obsessive compulsive disorder)   . Osteopenia 05/2019   T score -2.0 FRAX 32% / 16%  . Personal history of chemotherapy   . Personal history of radiation therapy   . PONV (postoperative nausea and vomiting)    post op nausea; took a long time waking up  . Tinnitus     Past Surgical History:  Procedure Laterality Date  . BREAST SURGERY     Mastectomy-Right  . CARDIAC CATHETERIZATION     husband was having symtpoms and needed a stent; subsequently she states she started " to feel something"  but thinks it was " all in my head bc of my hbsand". subsequently saw same cardiologist as her husband who sent for treadmill stress that had inconcluisicve results so she was  sent for cardiac catherization which she states was " definitely negative "   . cataract surg    . COLONOSCOPY     with polypectomy  . DILATION AND CURETTAGE OF UTERUS    . IR CV LINE INJECTION  09/30/2017  . MASTECTOMY Right   . MASTECTOMY W/ SENTINEL NODE BIOPSY Right 08/06/2017   Procedure: RIGHT MASTECTOMY WITH SENTINEL LYMPH NODE BIOPSY;  Surgeon: [name], MD;  Location: [facility];  Service: General;  Laterality: Right;  . MOUTH SURGERY    . PORT-A-CATH REMOVAL Left 01/20/2018   Procedure: REMOVAL PORT-A-CATH;  Surgeon: [name], MD;  Location: [facility];  Service: General;  Laterality: Left;  . PORTACATH PLACEMENT N/A 09/12/2017   Procedure: INSERTION PORT-A-CATH;  Surgeon: [name], MD;  [facility];  Service: General;  Laterality: N/A;  . RADIOACTIVE SEED GUIDED AXILLARY SENTINEL LYMPH NODE Right 08/06/2017   Procedure: SEED TARGETED AXILLARY LYMPH NODE EXCISION;  Surgeon: [name], MD;  Location: [facility];  Service: General;  Laterality: Right;    There were no vitals filed for this visit.  Subjective Assessment - 02/19/20 1108    Subjective  Booked flights to [place] to see family in June.  I'm mad at myself because I forgot my walking stick; walked out of the house without it today.    Pertinent History  PMH includes breast cancer, anxiety, depression, L foot fracture, L wrist fracture from fall in December; OCD, osteopenia    Patient Stated Goals  Pt's goal for therapy is to get onto and out of a chair without arms, getting in and out of the car, walking comfortably and turning.    Currently in Pain?  No/denies    Pain Onset  More than a month ago                        [facility] Adult PT Treatment/Exercise - 02/19/20 0001      Transfers   Transfers  Sit to Stand;Stand to Sit    Sit to Stand  6: Modified independent (Device/Increase time);With upper extremity assist;Without upper extremity assist;From  chair/3-in-1   Starts with UE support first rep, then no UE support   Five time sit to stand comments   13.06    Stand to Sit  5: Supervision;Without upper extremity assist;To chair/3-in-1      Ambulation/Gait   Ambulation/Gait  Yes    Ambulation/Gait Assistance  5: Supervision    Ambulation/Gait Assistance Details  Cues for improved heel strike with gait.  Pt at times takes too long of a step, and will need more practice on this.    Ambulation Distance (Feet)  230 Feet   120 ft 100 x 2   Assistive device  Straight cane   from clinic-pt forgot hers today   Gait Pattern  Step-through pattern;Decreased arm swing - left;Decreased step length - right;Decreased step length - left;Narrow base of support;Decreased arm swing - right    Ambulation Surface  Level;Indoor      Standardized Balance Assessment   Standardized Balance Assessment  Dynamic Gait Index;Timed Up and Go Test      Dynamic Gait Index   Level Surface  Mild Impairment    Change in Gait Speed  Mild Impairment    Gait with Horizontal Head Turns  Mild Impairment    Gait with Vertical Head Turns  Mild Impairment    Gait and Pivot Turn  Mild Impairment    Step Over Obstacle  Mild Impairment    Step Around Obstacles  Mild Impairment    Steps  Mild Impairment    Total Score  16      Timed Up and Go Test   TUG  Normal TUG    Normal TUG (seconds)  15.54   16.37 2nd attempt     Self-Care   Self-Care  Other Self-Care Comments    Other Self-Care Comments   Assessed orthostatic BP measures (see below) and discussed that PT will send info to Dr. Carles Collet.  Reiterated education from last visit, with pt verbalizing understanding of ways to minimize/reduce issues from blood pressure changes.  Discussed progress towards goals with PT.       Orthostatic hypotension measures: (assessed on LUE with manual BP cuff)  Supine after 5 minutes:  120/70 Standing after 1 minute:  94/60 c/o slight dizziness (not spinning) Standing after 3 minutes:   90/64       PT Education - 02/19/20 1154    Education Details  orthostatic BP measures; progress towards goals    Person(s) Educated  Patient    Methods  Explanation;Demonstration;Handout    Comprehension  Verbalized understanding       PT Short Term Goals - 02/19/20 1125      PT SHORT TERM GOAL #1   Title  Pt will be independent with HEP for improved transfers, strength, balance, and gait.  TARGET 02/12/2020 for all STGs    Time  5    Period  Weeks    Status  Achieved      PT SHORT TERM GOAL #2   Title  Pt will improve 5x sit<>stand to less than or equal to 13 seconds (with UE support), for improved transfer efficiency and lower extremity strength.    Baseline  15.28 sec with UE support; 5/14:  13.06 sec with no UE support    Time  5    Period  Weeks    Status  Achieved      PT SHORT TERM GOAL #3   Title  Pt will improve TUG score to less than or equal to 18 seconds for decreased fall risk.    Baseline  24.5 sec; 5/14 15.54 sec with cane    Time  5    Period  Weeks    Status  Achieved      PT SHORT TERM GOAL #4   Title  Pt will improve DGI score to at least 16/24 for decreased fall risk.    Baseline  13/24    Time  5    Period  Weeks    Status  Achieved      PT SHORT TERM GOAL #5   Title  Pt will report at least 50% improvement in car transfers, for improved overall functional mobility.    Baseline  Pt reports at least 50% improvement in car transfers; husband notes her improvement.    Time  5    Period  Weeks    Status  Achieved      PT SHORT TERM GOAL #6   Title  Pt will verbalize understanding of fall prevention in home environment, including tips to reducing freezing with gait.    Time  5    Period  Weeks    Status  On-going        PT Long Term Goals - 01/12/20 1556      PT LONG TERM GOAL #1   Title  Pt will be independent with progression of Parkinson's specific exercise program to improve overall functional mobility.  TARGET 9 weeks: 03/11/2020 for  all LTGs    Time  9    Period  Weeks    Status  New      PT LONG TERM GOAL #2   Title  Pt will perform at least 4 of 5 reps of sit<>stand from 18" chairs or lower, with minimal to no UE support, no LOB, for improved transfer efficiency and safety.    Time  9    Period  Weeks    Status  New      PT LONG TERM GOAL #3   Title  Pt will improve TUG score to less than or equal to 13.5 seconds for decreased fall risk.    Baseline  24.5 sec at eval    Time  9    Period  Weeks    Status  New      PT LONG TERM GOAL #4   Title  Pt will improve DGI score to at least 19/24 for decreased fall risk.    Baseline  13/24 at eval    Time  9  Period  Weeks    Status  New      PT LONG TERM GOAL #5   Title  Pt will improve gait velocity to at least 2.3 ft/sec for improved gait efficiency and safety.    Baseline  1.88 ft/sec at eval    Time  9    Period  Weeks    Status  New            Plan - 02/19/20 1155    Clinical Impression Statement  Began assessing STGs this visit, with pt meeting STG 1-5.  Ran out of time to address fall prevnetion (STG 6) today.  She has improved with all functional mobility measures, including sit<>stand, TUG and DGI scores.  She reports significant improvement in car transfers and overall mobility.  PT did assess orthostatic BP measures today, with >20 mm Hg decrease in systolic measure from supine>standing.  Pt c/o occasional dizziness through session (lightheadedness, not spinning as in vertigo).  Pt will continue to benefit from skilled PT to further address balance, strength, and overall functional mobility.    Personal Factors and Comorbidities  Comorbidity 3+    Comorbidities  PMH includes breast cancer, anxiety, depression, L foot fracture, L wrist fracture from fall in December; OCD, osteopenia    Examination-Activity Limitations  Locomotion Level;Transfers;Stairs;Stand;Other   car transfers   Examination-Participation Restrictions  Community Activity;Other    Community exercise and walking in neighborhood   Stability/Clinical Decision Making  Evolving/Moderate complexity   PD dx in February 2021, not yet started on PD medications   Rehab Potential  Good    PT Frequency  2x / week    PT Duration  Other (comment)   9 weeks, including eval week   PT Treatment/Interventions  ADLs/Self Care Home Management;DME Instruction;Neuromuscular re-education;Balance training;Therapeutic exercise;Therapeutic activities;Functional mobility training;Stair training;Gait training;Patient/family education;Vestibular;Canalith Repostioning    PT Next Visit Plan  Discuss fall prevention and check STG 6.  PT to send orthostatic measures to Dr. Carles Collet to make her aware of changes in BP, reports of dizziness; standing step strategies, arm swing, weightshifting activities (try to add to HEP as able)    Consulted and Agree with Plan of Care  Patient       Patient will benefit from skilled therapeutic intervention in order to improve the following deficits and impairments:  Abnormal gait, Difficulty walking, Impaired tone, Decreased balance, Decreased mobility, Decreased strength, Postural dysfunction, Dizziness  Visit Diagnosis: Other abnormalities of gait and mobility  Unsteadiness on feet  Muscle weakness (generalized)  Other symptoms and signs involving the nervous system     Problem List Patient Active Problem List   Diagnosis Date Noted  . Parkinson's disease ([place]) 12/30/2019  . Closed fracture of distal end of left radius 09/17/2019  . Closed fracture of styloid process of ulna 09/17/2019  . Insomnia 10/07/2017  . Dehydration 10/07/2017  . Port-A-Cath in place 09/30/2017  . Genetic testing 06/27/2017  . Family history of breast cancer   . Malignant neoplasm of upper-inner quadrant of right breast in female, estrogen receptor positive ([place]) 06/26/2017    Layonna Dobie W. 02/19/2020, 12:03 PM Frazier Butt., PT  [facility] [address] [place], [place], [address] Phone: [phone]   Fax:  [phone]  Name: Gloria Ware MRN: [id number] Date of Birth: 11-11-1945

## 2020-02-22 NOTE — Telephone Encounter (Signed)
Spoke with patient and she states she is trying to drink water and her dizziness is gone. She states sometimes she gets dizzy after taking the medication.  Patient states she thinks the medication she is taking is helping. She states she does not want to wear the abdominal compression binder at this time. She states she was not drinking much fluids, but she is trying to drink more water now. She states if she can not get her blood pressure she will contact the office to get the compression binder.   Informed her that I would make Dr Tat aware and get back to her if Dr Tat had any other suggestions. She voiced understanding.

## 2020-02-22 NOTE — Telephone Encounter (Signed)
Patient left message with [employer] 02/19/20 @ 4:56PM that she is returning a call to the office.

## 2020-02-23 ENCOUNTER — Ambulatory Visit: Payer: Medicare Other | Admitting: Physical Therapy

## 2020-02-23 ENCOUNTER — Other Ambulatory Visit: Payer: Self-pay

## 2020-02-23 DIAGNOSIS — R29818 Other symptoms and signs involving the nervous system: Secondary | ICD-10-CM

## 2020-02-23 DIAGNOSIS — F411 Generalized anxiety disorder: Secondary | ICD-10-CM | POA: Diagnosis not present

## 2020-02-23 DIAGNOSIS — R2689 Other abnormalities of gait and mobility: Secondary | ICD-10-CM

## 2020-02-23 DIAGNOSIS — R2681 Unsteadiness on feet: Secondary | ICD-10-CM | POA: Diagnosis not present

## 2020-02-23 DIAGNOSIS — M6281 Muscle weakness (generalized): Secondary | ICD-10-CM | POA: Diagnosis not present

## 2020-02-23 NOTE — Patient Instructions (Signed)
It is important to avoid accidents which may result in broken bones.  Here are a few ideas on how to make your home safer so you will be less likely to trip or fall.  1. Use nonskid mats or non slip strips in your shower or tub, on your bathroom floor and around sinks.  If you know that you have spilled water, wipe it up! 2. In the bathroom, it is important to have properly installed grab bars on the walls or on the edge of the tub.  Towel racks are NOT strong enough for you to hold onto or to pull on for support. 3. Stairs and hallways should have enough light.  Add lamps or night lights if you need ore light. 4. It is good to have handrails on both sides of the stairs if possible.  Always fix broken handrails right away. 5. It is important to see the edges of steps.  Paint the edges of outdoor steps white so you can see them better.  Put colored tape on the edge of inside steps. 6. Throw-rugs are dangerous because they can slide.  Removing the rugs is the best idea, but if they must stay, add adhesive carpet tape to prevent slipping. 7. Do not keep things on stairs or in the halls.  Remove small furniture that blocks the halls as it may cause you to trip.  Keep telephone and electrical cords out of the way where you walk. 8. Always were sturdy, rubber-soled shoes for good support.  Never wear just socks, especially on the stairs.  Socks may cause you to slip or fall.  Do not wear full-length housecoats as you can easily trip on the bottom.  9. Place the things you use the most on the shelves that are the easiest to reach.  If you use a stepstool, make sure it is in good condition.  If you feel unsteady, DO NOT climb, ask for help. 10. If a health professional advises you to use a cane or walker, do not be ashamed.  These items can keep you from falling and breaking your bones.    Tips to reduce freezing episodes with standing or walking:  11. Stand tall with your feet wide, so that you can rock  and weight shift through your hips. 12. Don't try to fight the freeze: if you begin taking slower, faster, smaller steps, STOP, get your posture tall, and RESET your posture and balance.  Take a deep breath before taking the BIG step to start again. 6. March in place, with high knee stepping, to get started walking again. 14. Use auditory cues:  Count out loud, think of a familiar tune or song or cadence, use pocket metronome, to use rhythm to get started walking again. 15. Use visual cues:  Use a line to step over, use laser pointer line to step over, (using BIG steps) to start walking again. 16. Use visual targets to keep your posture tall (look ahead and focus on an object or target at eye level). 17. As you approach where your destination with walking, count your steps out loud and/or focus on your target with your eyes until you are fully there. 18. Use appropriate assistive device, as advised by your physical therapist to assist with taking longer, consistent steps.

## 2020-02-23 NOTE — Therapy (Signed)
[facility] [address] [place], [place], [address] Phone: [phone]   Fax:  [phone]  Physical Therapy Treatment  Patient Details  Name: Gloria Ware MRN: [id number] Date of Birth: January 24, 1946 Referring Provider (PT): Alonza Bogus, DO   Encounter Date: 02/23/2020  PT End of Session - 02/23/20 1311    Visit Number  9    Number of Visits  18    Date for PT Re-Evaluation  13/24/40   90 day cert for 9 wk POC   Authorization Type  Medicare    Progress Note Due on Visit  10    PT Start Time  1019    PT Stop Time  1100    PT Time Calculation (min)  41 min    Activity Tolerance  Patient tolerated treatment well   no c/o dizziness today   Behavior During Therapy  [facility] for tasks assessed/performed       Past Medical History:  Diagnosis Date  . Anxiety   . Breast CA ([place])   . Colon polyp 09/12/2005  . Depression   . Eczema    right forearm   . Genetic testing 06/27/2017   Multi-Cancer panel (83 genes) @ [facility] - No pathogenic mutations detected  . Hearing aid worn    B/L  . Hearing loss   . High cholesterol   . Injury     left foot metatarsal fracture sustined 08-03-17 , treated with boot; patient  reports  improvement as of today  . OCD (obsessive compulsive disorder)   . Osteopenia 05/2019   T score -2.0 FRAX 32% / 16%  . Personal history of chemotherapy   . Personal history of radiation therapy   . PONV (postoperative nausea and vomiting)    post op nausea; took a long time waking up  . Tinnitus     Past Surgical History:  Procedure Laterality Date  . BREAST SURGERY     Mastectomy-Right  . CARDIAC CATHETERIZATION     husband was having symtpoms and needed a stent; subsequently she states she started " to feel something"  but thinks it was " all in my head bc of my hbsand". subsequently saw same cardiologist as her husband who sent for treadmill stress that had inconcluisicve results so she was  sent for cardiac catherization which she states was " definitely negative "   . cataract surg    . COLONOSCOPY     with polypectomy  . DILATION AND CURETTAGE OF UTERUS    . IR CV LINE INJECTION  09/30/2017  . MASTECTOMY Right   . MASTECTOMY W/ SENTINEL NODE BIOPSY Right 08/06/2017   Procedure: RIGHT MASTECTOMY WITH SENTINEL LYMPH NODE BIOPSY;  Surgeon: [name], MD;  Location: [facility];  Service: General;  Laterality: Right;  . MOUTH SURGERY    . PORT-A-CATH REMOVAL Left 01/20/2018   Procedure: REMOVAL PORT-A-CATH;  Surgeon: [name], MD;  Location: [facility];  Service: General;  Laterality: Left;  . PORTACATH PLACEMENT N/A 09/12/2017   Procedure: INSERTION PORT-A-CATH;  Surgeon: [name], MD;  Location: [facility];  Service: General;  Laterality: N/A;  . RADIOACTIVE SEED GUIDED AXILLARY SENTINEL LYMPH NODE Right 08/06/2017   Procedure: SEED TARGETED AXILLARY LYMPH NODE EXCISION;  Surgeon: [name], MD;  Location: [facility];  Service: General;  Laterality: Right;    There were no vitals filed for this visit.  Subjective Assessment - 02/23/20 1012    Subjective  Nothing new, doing okay today.  Have been playing phone tag with Dr. Doristine Devoid office-said making sure to drink plenty of fluids.  Been doing that and I don't notice the dizziness as much.    Pertinent History  PMH includes breast cancer, anxiety, depression, L foot fracture, L wrist fracture from fall in December; OCD, osteopenia    Patient Stated Goals  Pt's goal for therapy is to get onto and out of a chair without arms, getting in and out of the car, walking comfortably and turning.    Currently in Pain?  No/denies    Pain Onset  More than a month ago                       Neuro Re-education   [facility] Adult PT Treatment/Exercise - 02/23/20 0001      Transfers   Transfers  Sit to Stand;Stand to Sit    Sit to Stand  6: Modified independent (Device/Increase time);With  upper extremity assist;Without upper extremity assist;From chair/3-in-1    Stand to Sit  5: Supervision;Without upper extremity assist;To chair/3-in-1      Ambulation/Gait   Ambulation/Gait  Yes    Ambulation/Gait Assistance  5: Supervision    Ambulation Distance (Feet)  100 Feet   x 2   Assistive device  Straight cane   hurrycane   Gait Pattern  Step-through pattern;Decreased arm swing - left;Decreased step length - right;Decreased step length - left;Narrow base of support;Decreased arm swing - right    Ambulation Surface  Level;Indoor          Balance Exercises - 02/23/20 1042      Balance Exercises: Standing   Stepping Strategy  Anterior;Posterior;Lateral;UE support;10 reps    Other Standing Exercises  Standing wide BOS with lateral weigthshfiting x 10, then weigthshift and lift one leg, for dynamic SLS x 10 reps.  Then lateral weightshifting with reaching to same side, x 10 reps.      Other Standing Exercises Comments  Stagger stance weigthshifting x 15 reps, then progress to stagger stance weightshifting with 1 UE support at parallel bar, 1 UE reaching up and back for improved coordination of arm swing.       Therapeutic Exercise Prior to standing: Pt performs seated heel raises, alternating x 10 reps, then toe raises x 10 reps; marching in place x 10 reps, LAQ x 10 reps.  Cues to perform these exercises prior to standing to help avoid significant changes in blood pressure.      Self Care:   Fall prevention education provided; initial education on tips to reduce freezing/festination provided, with pt reporting she really doesn't have trouble with this at this time.   PT Education - 02/23/20 1311    Education Details  Fall prevention education; tips to reduce freezing/festination of gait    Person(s) Educated  Patient    Methods  Explanation;Handout    Comprehension  Verbalized understanding       PT Short Term Goals - 02/23/20 1405      PT SHORT TERM GOAL #1    Title  Pt will be independent with HEP for improved transfers, strength, balance, and gait.  TARGET 02/12/2020 for all STGs    Time  5    Period  Weeks    Status  Achieved      PT SHORT TERM GOAL #2   Title  Pt will improve 5x sit<>stand to less than or equal to 13 seconds (with UE support),  for improved transfer efficiency and lower extremity strength.    Baseline  15.28 sec with UE support; 5/14:  13.06 sec with no UE support    Time  5    Period  Weeks    Status  Achieved      PT SHORT TERM GOAL #3   Title  Pt will improve TUG score to less than or equal to 18 seconds for decreased fall risk.    Baseline  24.5 sec; 5/14 15.54 sec with cane    Time  5    Period  Weeks    Status  Achieved      PT SHORT TERM GOAL #4   Title  Pt will improve DGI score to at least 16/24 for decreased fall risk.    Baseline  13/24    Time  5    Period  Weeks    Status  Achieved      PT SHORT TERM GOAL #5   Title  Pt will report at least 50% improvement in car transfers, for improved overall functional mobility.    Baseline  Pt reports at least 50% improvement in car transfers; husband notes her improvement.    Time  5    Period  Weeks    Status  Achieved      PT SHORT TERM GOAL #6   Title  Pt will verbalize understanding of fall prevention in home environment, including tips to reducing freezing with gait.    Time  5    Period  Weeks    Status  Achieved        PT Long Term Goals - 01/12/20 1556      PT LONG TERM GOAL #1   Title  Pt will be independent with progression of Parkinson's specific exercise program to improve overall functional mobility.  TARGET 9 weeks: 03/11/2020 for all LTGs    Time  9    Period  Weeks    Status  New      PT LONG TERM GOAL #2   Title  Pt will perform at least 4 of 5 reps of sit<>stand from 18" chairs or lower, with minimal to no UE support, no LOB, for improved transfer efficiency and safety.    Time  9    Period  Weeks    Status  New      PT LONG TERM  GOAL #3   Title  Pt will improve TUG score to less than or equal to 13.5 seconds for decreased fall risk.    Baseline  24.5 sec at eval    Time  9    Period  Weeks    Status  New      PT LONG TERM GOAL #4   Title  Pt will improve DGI score to at least 19/24 for decreased fall risk.    Baseline  13/24 at eval    Time  9    Period  Weeks    Status  New      PT LONG TERM GOAL #5   Title  Pt will improve gait velocity to at least 2.3 ft/sec for improved gait efficiency and safety.    Baseline  1.88 ft/sec at eval    Time  9    Period  Weeks    Status  New            Plan - 02/23/20 1402    Clinical Impression Statement  STG 6 addressed and met with education  on fall prevention and tips to prevent festination with gait and turns.  Pt with less c/o dizziness today and able to tolerate mutliple standing exercises addressing balance in standing.  She will continue to benefit from additional standing balance exercises for improved overall mobility and balance.    Personal Factors and Comorbidities  Comorbidity 3+    Comorbidities  PMH includes breast cancer, anxiety, depression, L foot fracture, L wrist fracture from fall in December; OCD, osteopenia    Examination-Activity Limitations  Locomotion Level;Transfers;Stairs;Stand;Other   car transfers   Examination-Participation Restrictions  Community Activity;Other   Community exercise and walking in neighborhood   Stability/Clinical Decision Making  Evolving/Moderate complexity   PD dx in February 2021, not yet started on PD medications   Rehab Potential  Good    PT Frequency  2x / week    PT Duration  Other (comment)   9 weeks, including eval week   PT Treatment/Interventions  ADLs/Self Care Home Management;DME Instruction;Neuromuscular re-education;Balance training;Therapeutic exercise;Therapeutic activities;Functional mobility training;Stair training;Gait training;Patient/family education;Vestibular;Canalith Repostioning    PT  Next Visit Plan  Standing weightshifting, step strategies, arm swing-add standing exercises to HEP; monitor orthostatic BP measures as needed; NEXT VISIT is 10th VISIT progress note    Consulted and Agree with Plan of Care  Patient       Patient will benefit from skilled therapeutic intervention in order to improve the following deficits and impairments:  Abnormal gait, Difficulty walking, Impaired tone, Decreased balance, Decreased mobility, Decreased strength, Postural dysfunction, Dizziness  Visit Diagnosis: Unsteadiness on feet  Other abnormalities of gait and mobility  Other symptoms and signs involving the nervous system     Problem List Patient Active Problem List   Diagnosis Date Noted  . Parkinson's disease ([place]) 12/30/2019  . Closed fracture of distal end of left radius 09/17/2019  . Closed fracture of styloid process of ulna 09/17/2019  . Insomnia 10/07/2017  . Dehydration 10/07/2017  . Port-A-Cath in place 09/30/2017  . Genetic testing 06/27/2017  . Family history of breast cancer   . Malignant neoplasm of upper-inner quadrant of right breast in female, estrogen receptor positive ([place]) 06/26/2017    MARRIOTT,AMY W. 02/23/2020, 2:07 PM  Frazier Butt., PT   [name] [address] [place], [place], [address] Phone: [phone]   Fax:  [phone]  Name: VEDANSHI MASSARO MRN: [id number] Date of Birth: [birth year]

## 2020-02-26 ENCOUNTER — Other Ambulatory Visit: Payer: Self-pay

## 2020-02-26 ENCOUNTER — Encounter: Payer: Self-pay | Admitting: Physical Therapy

## 2020-02-26 ENCOUNTER — Ambulatory Visit: Payer: Medicare Other | Admitting: Physical Therapy

## 2020-02-26 VITALS — BP 88/60 | HR 84

## 2020-02-26 DIAGNOSIS — R2681 Unsteadiness on feet: Secondary | ICD-10-CM | POA: Diagnosis not present

## 2020-02-26 DIAGNOSIS — R29818 Other symptoms and signs involving the nervous system: Secondary | ICD-10-CM | POA: Diagnosis not present

## 2020-02-26 DIAGNOSIS — R2689 Other abnormalities of gait and mobility: Secondary | ICD-10-CM

## 2020-02-26 DIAGNOSIS — M6281 Muscle weakness (generalized): Secondary | ICD-10-CM | POA: Diagnosis not present

## 2020-02-26 NOTE — Therapy (Signed)
[facility] [address] [place], [place], [address] Phone: [phone]   Fax:  [phone]  Physical Therapy Treatment/10th Visit Progress Note  Patient Details  Name: Gloria Ware MRN: [id number] Date of Birth: [birth year] Referring Provider (PT): Alonza Bogus, DO   Encounter Date: 02/26/2020  PT End of Session - 02/26/20 1507    Visit Number  10    Number of Visits  18    Date for PT Re-Evaluation  99991111   90 day cert for 9 wk POC   Authorization Type  Medicare    Progress Note Due on Visit  10    PT Start Time  1102    PT Stop Time  1143    PT Time Calculation (min)  41 min    Activity Tolerance  Patient tolerated treatment well   no c/o dizziness today   Behavior During Therapy  [facility] for tasks assessed/performed       Past Medical History:  Diagnosis Date  . Anxiety   . Breast CA ([place])   . Colon polyp 09/12/2005  . Depression   . Eczema    right forearm   . Genetic testing 06/27/2017   Multi-Cancer panel (83 genes) @ [facility] - No pathogenic mutations detected  . Hearing aid worn    B/L  . Hearing loss   . High cholesterol   . Injury     left foot metatarsal fracture sustined 08-03-17 , treated with boot; patient  reports  improvement as of today  . OCD (obsessive compulsive disorder)   . Osteopenia 05/2019   T score -2.0 FRAX 32% / 16%  . Personal history of chemotherapy   . Personal history of radiation therapy   . PONV (postoperative nausea and vomiting)    post op nausea; took a long time waking up  . Tinnitus     Past Surgical History:  Procedure Laterality Date  . BREAST SURGERY     Mastectomy-Right  . CARDIAC CATHETERIZATION     husband was having symtpoms and needed a stent; subsequently she states she started " to feel something"  but thinks it was " all in my head bc of my hbsand". subsequently saw same cardiologist as her husband who sent for treadmill stress that had  inconcluisicve results so she was sent for cardiac catherization which she states was " definitely negative "   . cataract surg    . COLONOSCOPY     with polypectomy  . DILATION AND CURETTAGE OF UTERUS    . IR CV LINE INJECTION  09/30/2017  . MASTECTOMY Right   . MASTECTOMY W/ SENTINEL NODE BIOPSY Right 08/06/2017   Procedure: RIGHT MASTECTOMY WITH SENTINEL LYMPH NODE BIOPSY;  Surgeon: [name], MD;  Location: [facility];  Service: General;  Laterality: Right;  . MOUTH SURGERY    . PORT-A-CATH REMOVAL Left 01/20/2018   Procedure: REMOVAL PORT-A-CATH;  Surgeon: [name], MD;  Location: [facility];  Service: General;  Laterality: Left;  . PORTACATH PLACEMENT N/A 09/12/2017   Procedure: INSERTION PORT-A-CATH;  Surgeon: [name], MD;  Location: [facility];  Service: General;  Laterality: N/A;  . RADIOACTIVE SEED GUIDED AXILLARY SENTINEL LYMPH NODE Right 08/06/2017   Procedure: SEED TARGETED AXILLARY LYMPH NODE EXCISION;  Surgeon: [name], MD;  Location: [facility];  Service: General;  Laterality: Right;    Vitals:   02/26/20 1109 02/26/20 1116 02/26/20 1121  BP: (!) 86/57 (sitting, automatic cuff)  90/60 (sitting, manual cuff) (!) 88/60 (standing manual cuff)  Pulse: 84      Subjective Assessment - 02/26/20 1106    Subjective  Feeling pretty good, a little dizzy this morning.    Pertinent History  PMH includes breast cancer, anxiety, depression, L foot fracture, L wrist fracture from fall in December; OCD, osteopenia    Patient Stated Goals  Pt's goal for therapy is to get onto and out of a chair without arms, getting in and out of the car, walking comfortably and turning.    Currently in Pain?  No/denies    Pain Onset  More than a month ago                        [facility] Adult PT Treatment/Exercise - 02/26/20 0001      Therapeutic Activites    Therapeutic Activities  Other Therapeutic Activities    Other Therapeutic Activities   Blood pressure measures assessed in sitting with automatic cuff, then in sitting and standing with manual cuff.  Pt's BP measures are overall low today, provided information for patient regarding low blood pressure measures.  (From [employer] manual).  Encouraged patient to follow up again with Dr. Carles Collet regarding low blood pressure measures.  Pt asks multiple questions about f/u with Dr. Carles Collet, and PT recommends pt take BP measures at home (especially with her dizzy spells) over the next several days and track her BP measures so that she can give numbers to Dr. Carles Collet if BP remains low.  Recommended pt f/u with Dr. Carles Collet about additional BP questions and that if BP meausres remain low with continued dizziness, we may have to put therapy sessions on hold.          Balance Exercises - 02/26/20 1506      Balance Exercises: Standing   Other Standing Exercises  Standing wide BOS with lateral weightshifting, UE support at counter, x 10 reps    Other Standing Exercises Comments  Stagger stance forward/back rocking at counter with BUE support, x 10 reps.  No c/o additional dizziness in standing at counter for the above exercises.        PT Education - 02/26/20 1507    Education Details  See Therapeutic Activity Note    Person(s) Educated  Patient    Methods  Explanation;Handout    Comprehension  Verbalized understanding       PT Short Term Goals - 02/23/20 1405      PT SHORT TERM GOAL #1   Title  Pt will be independent with HEP for improved transfers, strength, balance, and gait.  TARGET 02/12/2020 for all STGs    Time  5    Period  Weeks    Status  Achieved      PT SHORT TERM GOAL #2   Title  Pt will improve 5x sit<>stand to less than or equal to 13 seconds (with UE support), for improved transfer efficiency and lower extremity strength.    Baseline  15.28 sec with UE support; 5/14:  13.06 sec with no UE support    Time  5    Period  Weeks    Status  Achieved      PT SHORT TERM GOAL  #3   Title  Pt will improve TUG score to less than or equal to 18 seconds for decreased fall risk.    Baseline  24.5 sec; 5/14 15.54 sec with cane    Time  5    Period  Weeks    Status  Achieved      PT SHORT TERM GOAL #4   Title  Pt will improve DGI score to at least 16/24 for decreased fall risk.    Baseline  13/24    Time  5    Period  Weeks    Status  Achieved      PT SHORT TERM GOAL #5   Title  Pt will report at least 50% improvement in car transfers, for improved overall functional mobility.    Baseline  Pt reports at least 50% improvement in car transfers; husband notes her improvement.    Time  5    Period  Weeks    Status  Achieved      PT SHORT TERM GOAL #6   Title  Pt will verbalize understanding of fall prevention in home environment, including tips to reducing freezing with gait.    Time  5    Period  Weeks    Status  Achieved        PT Long Term Goals - 01/12/20 1556      PT LONG TERM GOAL #1   Title  Pt will be independent with progression of Parkinson's specific exercise program to improve overall functional mobility.  TARGET 9 weeks: 03/11/2020 for all LTGs    Time  9    Period  Weeks    Status  New      PT LONG TERM GOAL #2   Title  Pt will perform at least 4 of 5 reps of sit<>stand from 18" chairs or lower, with minimal to no UE support, no LOB, for improved transfer efficiency and safety.    Time  9    Period  Weeks    Status  New      PT LONG TERM GOAL #3   Title  Pt will improve TUG score to less than or equal to 13.5 seconds for decreased fall risk.    Baseline  24.5 sec at eval    Time  9    Period  Weeks    Status  New      PT LONG TERM GOAL #4   Title  Pt will improve DGI score to at least 19/24 for decreased fall risk.    Baseline  13/24 at eval    Time  9    Period  Weeks    Status  New      PT LONG TERM GOAL #5   Title  Pt will improve gait velocity to at least 2.3 ft/sec for improved gait efficiency and safety.    Baseline  1.88  ft/sec at eval    Time  9    Period  Weeks    Status  New            Plan - 02/26/20 1508    Clinical Impression Statement  10th Visit Progress Note, covering dates 01/12/2020-02/26/2020:  Pt overall reports she is feeling and moving much better.  She is having c/o dizziness (not room spinning like vertigo).  Orthostatic BP measures were taken last week and sent to MD.  Objective measures taken at goal check 02/19/2020:  5x sit<>stand 13.84 sec, improved from 15.28 sec at eval; DGI 16/24, improved from 13/24 at eval; TUG score 15.54 sec, improved from 26.41 sec.  Pt has improved overall functional mobility measures, but remains at fall risk per DGI and TUG scores.  In the past week, however,  her reports of dizziness and low BP measures in standing have limited further standing and walking activities.  Pt is making steady improvement with therapy and would continue to benefit from further therapy to address balance, functional stregnth and gait for improved mobility and decreased fall risk.  Will plan to continue to work towards [place].    Personal Factors and Comorbidities  Comorbidity 3+    Comorbidities  PMH includes breast cancer, anxiety, depression, L foot fracture, L wrist fracture from fall in December; OCD, osteopenia    Examination-Activity Limitations  Locomotion Level;Transfers;Stairs;Stand;Other   car transfers   Examination-Participation Restrictions  Community Activity;Other   Community exercise and walking in neighborhood   Stability/Clinical Decision Making  Evolving/Moderate complexity   PD dx in February 2021, not yet started on PD medications   Rehab Potential  Good    PT Frequency  2x / week    PT Duration  Other (comment)   9 weeks, including eval week   PT Treatment/Interventions  ADLs/Self Care Home Management;DME Instruction;Neuromuscular re-education;Balance training;Therapeutic exercise;Therapeutic activities;Functional mobility training;Stair training;Gait  training;Patient/family education;Vestibular;Canalith Repostioning    PT Next Visit Plan  Standing weightshifting, step strategies, arm swing-add standing exercises to HEP; monitor orthostatic BP measures as needed    Consulted and Agree with Plan of Care  Patient       Patient will benefit from skilled therapeutic intervention in order to improve the following deficits and impairments:  Abnormal gait, Difficulty walking, Impaired tone, Decreased balance, Decreased mobility, Decreased strength, Postural dysfunction, Dizziness  Visit Diagnosis: Unsteadiness on feet  Other abnormalities of gait and mobility     Problem List Patient Active Problem List   Diagnosis Date Noted  . Parkinson's disease ([place]) 12/30/2019  . Closed fracture of distal end of left radius 09/17/2019  . Closed fracture of styloid process of ulna 09/17/2019  . Insomnia 10/07/2017  . Dehydration 10/07/2017  . Port-A-Cath in place 09/30/2017  . Genetic testing 06/27/2017  . Family history of breast cancer   . Malignant neoplasm of upper-inner quadrant of right breast in female, estrogen receptor positive ([place]) 06/26/2017    [place] Tome W. 02/26/2020, 3:15 PM  Frazier Butt., PT   [facility] [address] [place], [place], [address] Phone: [phone]   Fax:  [phone]  Name: Gloria Ware MRN: [id number] Date of Birth: Apr 14, 1946

## 2020-03-01 ENCOUNTER — Other Ambulatory Visit: Payer: Self-pay

## 2020-03-01 ENCOUNTER — Ambulatory Visit: Payer: Medicare Other | Admitting: Physical Therapy

## 2020-03-01 VITALS — BP 105/65

## 2020-03-01 DIAGNOSIS — R2689 Other abnormalities of gait and mobility: Secondary | ICD-10-CM

## 2020-03-01 DIAGNOSIS — R29818 Other symptoms and signs involving the nervous system: Secondary | ICD-10-CM | POA: Diagnosis not present

## 2020-03-01 DIAGNOSIS — M6281 Muscle weakness (generalized): Secondary | ICD-10-CM | POA: Diagnosis not present

## 2020-03-01 DIAGNOSIS — R2681 Unsteadiness on feet: Secondary | ICD-10-CM

## 2020-03-01 NOTE — Therapy (Signed)
[facility] [address] [place], [place], [address] Phone: [phone]   Fax:  [phone]  Physical Therapy Treatment  Patient Details  Name: Gloria Ware MRN: [id number] Date of Birth: 15-Apr-1946 Referring Provider (PT): Alonza Bogus, DO   Encounter Date: 03/01/2020  PT End of Session - 03/01/20 1756    Visit Number  11    Number of Visits  18    Date for PT Re-Evaluation  99991111   90 day cert for 9 wk POC   Authorization Type  Medicare    Progress Note Due on Visit  10    PT Start Time  1618    PT Stop Time  1700    PT Time Calculation (min)  42 min    Equipment Utilized During Treatment  Gait belt    Activity Tolerance  Patient tolerated treatment well   limited by dizziness at end of session   Behavior During Therapy  [facility] for tasks assessed/performed       Past Medical History:  Diagnosis Date  . Anxiety   . Breast CA ([place])   . Colon polyp 09/12/2005  . Depression   . Eczema    right forearm   . Genetic testing 06/27/2017   Multi-Cancer panel (83 genes) @ [facility] - No pathogenic mutations detected  . Hearing aid worn    B/L  . Hearing loss   . High cholesterol   . Injury     left foot metatarsal fracture sustined 08-03-17 , treated with boot; patient  reports  improvement as of today  . OCD (obsessive compulsive disorder)   . Osteopenia 05/2019   T score -2.0 FRAX 32% / 16%  . Personal history of chemotherapy   . Personal history of radiation therapy   . PONV (postoperative nausea and vomiting)    post op nausea; took a long time waking up  . Tinnitus     Past Surgical History:  Procedure Laterality Date  . BREAST SURGERY     Mastectomy-Right  . CARDIAC CATHETERIZATION     husband was having symtpoms and needed a stent; subsequently she states she started " to feel something"  but thinks it was " all in my head bc of my hbsand". subsequently saw same cardiologist as her husband who  sent for treadmill stress that had inconcluisicve results so she was sent for cardiac catherization which she states was " definitely negative "   . cataract surg    . COLONOSCOPY     with polypectomy  . DILATION AND CURETTAGE OF UTERUS    . IR CV LINE INJECTION  09/30/2017  . MASTECTOMY Right   . MASTECTOMY W/ SENTINEL NODE BIOPSY Right 08/06/2017   Procedure: RIGHT MASTECTOMY WITH SENTINEL LYMPH NODE BIOPSY;  Surgeon: [name], MD;  Location: [facility];  Service: General;  Laterality: Right;  . MOUTH SURGERY    . PORT-A-CATH REMOVAL Left 01/20/2018   Procedure: REMOVAL PORT-A-CATH;  Surgeon: [name], MD;  Location: [facility];  Service: General;  Laterality: Left;  . PORTACATH PLACEMENT N/A 09/12/2017   Procedure: INSERTION PORT-A-CATH;  Surgeon: [name], MD;  [facility];  Service: General;  Laterality: N/A;  . RADIOACTIVE SEED GUIDED AXILLARY SENTINEL LYMPH NODE Right 08/06/2017   Procedure: SEED TARGETED AXILLARY LYMPH NODE EXCISION;  Surgeon: [name], MD;  Location: [facility];  Service: General;  Laterality: Right;    Vitals:   03/01/20 1649  BP: 105/65    Subjective Assessment - 03/01/20 1621    Subjective  Supposed to take her levodopa at 4:30 - did not take it before therapy today at 4:15. Forgot her walking stick today. Dizziness has been much better - has been drinking more water. No dizziness right now. Getting in and out the car is getting much better.    Pertinent History  PMH includes breast cancer, anxiety, depression, L foot fracture, L wrist fracture from fall in December; OCD, osteopenia    Patient Stated Goals  Pt's goal for therapy is to get onto and out of a chair without arms, getting in and out of the car, walking comfortably and turning.    Currently in Pain?  No/denies    Pain Onset  More than a month ago                Access Code: [email or web address] URL: [email or web address] Date:  03/01/2020 Prepared by: Janann August  New additions to HEP:   Exercises Staggered Stance Forward Backward Weight Shift with Counter Support - 2 x daily - 5 x weekly - 1 sets - 10 reps - with BUE support, verbal and demo cues for proper technique  Side to Side Weight Shift with Counter Support - 2 x daily - 5 x weekly - 1 sets - 10 reps - with fingertip support       NMR:  At countertop with single UE support: - forward stepping over 2" black beam with cues for heel strike and weight shifting x10 reps B, cues for foot clearance, min guard for balance -lateral stepping over 2" black beam, cues for full weight shift before stepping back over beam to midline -A/P staggered stance weight shift with reciprocal arm swing x10 reps B, initial demo cues for technique, pt reporting feeling dizzy after performing    [facility] Adult PT Treatment/Exercise - 03/01/20 0001      Ambulation/Gait   Ambulation/Gait  Yes    Ambulation/Gait Assistance  5: Supervision;4: Min guard    Ambulation/Gait Assistance Details  pt forgot her cane today, ambulated with no AD, cues for heel strike     Ambulation Distance (Feet)  115 Feet   approx. clinic distances   Assistive device  None    Gait Pattern  Step-through pattern;Decreased arm swing - left;Decreased step length - right;Decreased step length - left;Narrow base of support;Decreased arm swing - right      Therapeutic Activites    Therapeutic Activities  Other Therapeutic Activities    Other Therapeutic Activities  Pt with only one report of dizziness today after standing staggered stance A/P weight shifting with arm lift. Took pt's BP with automatic cuff with inaccurate readings and tried manually (see vitals). Pt reporting no dizziness after seated rest break. Discussed with pt continuing to monitor BP at home with and without her dizzy spells and to follow up with Dr. Carles Collet If she continues to have low BP with dizziness              PT Education -  03/01/20 1754    Education Details  new additions to HEP, see TA regarding BP    Person(s) Educated  Patient    Methods  Explanation;Handout;Demonstration    Comprehension  Verbalized understanding;Returned demonstration       PT Short Term Goals - 02/23/20 1405      PT SHORT TERM GOAL #1   Title  Pt will be independent with HEP for improved transfers, strength, balance,  and gait.  TARGET 02/12/2020 for all STGs    Time  5    Period  Weeks    Status  Achieved      PT SHORT TERM GOAL #2   Title  Pt will improve 5x sit<>stand to less than or equal to 13 seconds (with UE support), for improved transfer efficiency and lower extremity strength.    Baseline  15.28 sec with UE support; 5/14:  13.06 sec with no UE support    Time  5    Period  Weeks    Status  Achieved      PT SHORT TERM GOAL #3   Title  Pt will improve TUG score to less than or equal to 18 seconds for decreased fall risk.    Baseline  24.5 sec; 5/14 15.54 sec with cane    Time  5    Period  Weeks    Status  Achieved      PT SHORT TERM GOAL #4   Title  Pt will improve DGI score to at least 16/24 for decreased fall risk.    Baseline  13/24    Time  5    Period  Weeks    Status  Achieved      PT SHORT TERM GOAL #5   Title  Pt will report at least 50% improvement in car transfers, for improved overall functional mobility.    Baseline  Pt reports at least 50% improvement in car transfers; husband notes her improvement.    Time  5    Period  Weeks    Status  Achieved      PT SHORT TERM GOAL #6   Title  Pt will verbalize understanding of fall prevention in home environment, including tips to reducing freezing with gait.    Time  5    Period  Weeks    Status  Achieved        PT Long Term Goals - 01/12/20 1556      PT LONG TERM GOAL #1   Title  Pt will be independent with progression of Parkinson's specific exercise program to improve overall functional mobility.  TARGET 9 weeks: 03/11/2020 for all LTGs    Time   9    Period  Weeks    Status  New      PT LONG TERM GOAL #2   Title  Pt will perform at least 4 of 5 reps of sit<>stand from 18" chairs or lower, with minimal to no UE support, no LOB, for improved transfer efficiency and safety.    Time  9    Period  Weeks    Status  New      PT LONG TERM GOAL #3   Title  Pt will improve TUG score to less than or equal to 13.5 seconds for decreased fall risk.    Baseline  24.5 sec at eval    Time  9    Period  Weeks    Status  New      PT LONG TERM GOAL #4   Title  Pt will improve DGI score to at least 19/24 for decreased fall risk.    Baseline  13/24 at eval    Time  9    Period  Weeks    Status  New      PT LONG TERM GOAL #5   Title  Pt will improve gait velocity to at least 2.3 ft/sec for improved gait efficiency and safety.  Baseline  1.88 ft/sec at eval    Time  9    Period  Weeks    Status  New            Plan - 03/01/20 1808    Clinical Impression Statement  Pt with no reports of dizziness until end of session with staggered stance A/P weight shifting with arm swing and single UE support. Needed seated rest break, with pt's vitals being 105/65 after a couple minutes (initially tried with automatic cuff, but got inaccurate readings). Discussed to continue to monitor BP at home at least 2x per day. Added standing staggered weight shift and lateral weight shift with BUE support to HEP with pt able to demo correctly. Will continue to progress towards LTGs.    Personal Factors and Comorbidities  Comorbidity 3+    Comorbidities  PMH includes breast cancer, anxiety, depression, L foot fracture, L wrist fracture from fall in December; OCD, osteopenia    Examination-Activity Limitations  Locomotion Level;Transfers;Stairs;Stand;Other   car transfers   Examination-Participation Restrictions  Community Activity;Other   Community exercise and walking in neighborhood   Stability/Clinical Decision Making  Evolving/Moderate complexity   PD dx  in February 2021, not yet started on PD medications   Rehab Potential  Good    PT Frequency  2x / week    PT Duration  Other (comment)   9 weeks, including eval week   PT Treatment/Interventions  ADLs/Self Care Home Management;DME Instruction;Neuromuscular re-education;Balance training;Therapeutic exercise;Therapeutic activities;Functional mobility training;Stair training;Gait training;Patient/family education;Vestibular;Canalith Repostioning    PT Next Visit Plan  how did new additions to HEP go? how is dizziness? gait over outdoor surfaces. Standing weightshifting, step strategies, arm swing. monitor orthostatic BP measures as needed    Consulted and Agree with Plan of Care  Patient       Patient will benefit from skilled therapeutic intervention in order to improve the following deficits and impairments:  Abnormal gait, Difficulty walking, Impaired tone, Decreased balance, Decreased mobility, Decreased strength, Postural dysfunction, Dizziness  Visit Diagnosis: Unsteadiness on feet  Other abnormalities of gait and mobility  Other symptoms and signs involving the nervous system  Muscle weakness (generalized)     Problem List Patient Active Problem List   Diagnosis Date Noted  . Parkinson's disease ([place]) 12/30/2019  . Closed fracture of distal end of left radius 09/17/2019  . Closed fracture of styloid process of ulna 09/17/2019  . Insomnia 10/07/2017  . Dehydration 10/07/2017  . Port-A-Cath in place 09/30/2017  . Genetic testing 06/27/2017  . Family history of breast cancer   . Malignant neoplasm of upper-inner quadrant of right breast in female, estrogen receptor positive ([place]) 06/26/2017    Arliss Journey, PT, DPT  03/01/2020, 6:10 PM  [facility] [address] [place], [place], [address] Phone: [phone]   Fax:  [phone]  Name: Gloria Ware MRN: [id number] Date of Birth: 01-Sep-1946

## 2020-03-01 NOTE — Patient Instructions (Signed)
Access Code: [email or web address] URL: [email or web address] Date: 03/01/2020 Prepared by: Janann August  Exercises Staggered Stance Forward Backward Weight Shift with Counter Support - 2 x daily - 5 x weekly - 1 sets - 10 reps Side to Side Weight Shift with Counter Support - 2 x daily - 5 x weekly - 1 sets - 10 reps

## 2020-03-04 ENCOUNTER — Ambulatory Visit: Payer: Medicare Other | Admitting: Physical Therapy

## 2020-03-04 ENCOUNTER — Other Ambulatory Visit: Payer: Self-pay

## 2020-03-04 DIAGNOSIS — R2681 Unsteadiness on feet: Secondary | ICD-10-CM

## 2020-03-04 DIAGNOSIS — R2689 Other abnormalities of gait and mobility: Secondary | ICD-10-CM

## 2020-03-04 DIAGNOSIS — M6281 Muscle weakness (generalized): Secondary | ICD-10-CM | POA: Diagnosis not present

## 2020-03-04 DIAGNOSIS — R29818 Other symptoms and signs involving the nervous system: Secondary | ICD-10-CM | POA: Diagnosis not present

## 2020-03-04 NOTE — Therapy (Signed)
[facility] [address] [place], [place], [address] Phone: [phone]   Fax:  [phone]  Physical Therapy Treatment  Patient Details  Name: Gloria Ware MRN: [id number] Date of Birth: 04/21/1946 Referring Provider (PT): Alonza Bogus, DO   Encounter Date: 03/04/2020  PT End of Session - 03/04/20 1106    Visit Number  12    Number of Visits  18    Date for PT Re-Evaluation  99991111   90 day cert for 9 wk POC   Authorization Type  Medicare    Progress Note Due on Visit  10    PT Start Time  1104    PT Stop Time  1144    PT Time Calculation (min)  40 min    Equipment Utilized During Treatment  --    Activity Tolerance  Patient tolerated treatment well   limited by dizziness at end of session   Behavior During Therapy  [facility] for tasks assessed/performed       Past Medical History:  Diagnosis Date  . Anxiety   . Breast CA ([place])   . Colon polyp 09/12/2005  . Depression   . Eczema    right forearm   . Genetic testing 06/27/2017   Multi-Cancer panel (83 genes) @ [facility] - No pathogenic mutations detected  . Hearing aid worn    B/L  . Hearing loss   . High cholesterol   . Injury     left foot metatarsal fracture sustined 08-03-17 , treated with boot; patient  reports  improvement as of today  . OCD (obsessive compulsive disorder)   . Osteopenia 05/2019   T score -2.0 FRAX 32% / 16%  . Personal history of chemotherapy   . Personal history of radiation therapy   . PONV (postoperative nausea and vomiting)    post op nausea; took a long time waking up  . Tinnitus     Past Surgical History:  Procedure Laterality Date  . BREAST SURGERY     Mastectomy-Right  . CARDIAC CATHETERIZATION     husband was having symtpoms and needed a stent; subsequently she states she started " to feel something"  but thinks it was " all in my head bc of my hbsand". subsequently saw same cardiologist as her husband who sent for  treadmill stress that had inconcluisicve results so she was sent for cardiac catherization which she states was " definitely negative "   . cataract surg    . COLONOSCOPY     with polypectomy  . DILATION AND CURETTAGE OF UTERUS    . IR CV LINE INJECTION  09/30/2017  . MASTECTOMY Right   . MASTECTOMY W/ SENTINEL NODE BIOPSY Right 08/06/2017   Procedure: RIGHT MASTECTOMY WITH SENTINEL LYMPH NODE BIOPSY;  Surgeon: [name], MD;  Location: [place] [facility];  Service: General;  Laterality: Right;  . MOUTH SURGERY    . PORT-A-CATH REMOVAL Left 01/20/2018   Procedure: REMOVAL PORT-A-CATH;  Surgeon: [name], MD;  Location: [facility];  Service: General;  Laterality: Left;  . PORTACATH PLACEMENT N/A 09/12/2017   Procedure: INSERTION PORT-A-CATH;  Surgeon: [name], MD;  [facility];  Service: General;  Laterality: N/A;  . RADIOACTIVE SEED GUIDED AXILLARY SENTINEL LYMPH NODE Right 08/06/2017   Procedure: SEED TARGETED AXILLARY LYMPH NODE EXCISION;  Surgeon: [name], MD;  Location: [facility];  Service: General;  Laterality: Right;    There were no vitals filed for this  visit.  Subjective Assessment - 03/04/20 1105    Subjective  Have had a pretty good week- a little dizziness, but not bad.    Pertinent History  PMH includes breast cancer, anxiety, depression, L foot fracture, L wrist fracture from fall in December; OCD, osteopenia    Patient Stated Goals  Pt's goal for therapy is to get onto and out of a chair without arms, getting in and out of the car, walking comfortably and turning.    Currently in Pain?  No/denies    Pain Onset  More than a month ago                      Therapeutic Activity:  [facility] Adult PT Treatment/Exercise - 03/04/20 0001      Transfers   Transfers  Sit to Stand;Stand to Sit    Sit to Stand  6: Modified independent (Device/Increase time);With upper extremity assist;Without upper extremity assist;From bed     Stand to Sit  5: Supervision;Without upper extremity assist;To bed    Comments  Performed at least 4 reps throughout session.  Cues for visual target at eye level for improved upright posture and balance upon standing.      Ambulation/Gait   Ambulation/Gait  Yes    Ambulation/Gait Assistance  5: Supervision    Ambulation/Gait Assistance Details  Pt uses her cane during gait activities today.  Gait with starting/stopping quickly, with cues to use widened BOS or A/P staggered stance for improved balance with transitional movements.  Pt responds well to these cues and does not have LOB or dizziness with gait.    Ambulation Distance (Feet)  250 Feet    Assistive device  Straight cane   with tripod base   Gait Pattern  Step-through pattern;Decreased arm swing - left;Decreased step length - right;Decreased step length - left;Narrow base of support;Decreased arm swing - right        Neuro Re-education   Reviewed Exercises as HEP: Staggered Stance Forward Backward Weight Shift with Counter Support - 2 x daily - 5 x weekly - 1 sets - 10 reps - with BUE support, verbal and demo cues for proper technique  Side to Side Weight Shift with Counter Support - 2 x daily - 5 x weekly - 1 sets - 10 reps - with fingertip support       NMR:  At countertop with single UE support: - forward stepping over 2" black beam with cues for heel strike and weight shifting x10 reps B, cues for foot clearance, alternating legs, min guard for balance -lateral stepping over 2" black beam, alternating legs, cues for full weight shift before stepping back over beam to midline -combined forward/back step and weightshift 10 reps with each leg, RUE support at counter, cues for increased step length posterior direction -A/P staggered stance weight shift with one arm swing/one UE at counter x 10 reps, the progressed to reciprocal arm swing x5 reps Bilat, initial demo cues for technique,     Balance Exercises - 03/04/20 1133       Balance Exercises: Standing   Step Ups  Forward;6 inch;UE support 2   Step up/up, down/down x 10 reps each leg leading   Other Standing Exercises  Step taps to 6" step, alteranting legs x 10 reps, then single step taps to 12" step x 10 reps, then alternating step taps to 12" step, x 5 reps.  Initial cues for deliberate step height and foot clearance.  PT Education - 03/04/20 1149    Education Details  use of widened BOS and staggered stance positions for improved balance with quick stops, transitional movement    Person(s) Educated  Patient    Methods  Explanation;Demonstration    Comprehension  Verbalized understanding;Returned demonstration       PT Short Term Goals - 02/23/20 1405      PT SHORT TERM GOAL #1   Title  Pt will be independent with HEP for improved transfers, strength, balance, and gait.  TARGET 02/12/2020 for all STGs    Time  5    Period  Weeks    Status  Achieved      PT SHORT TERM GOAL #2   Title  Pt will improve 5x sit<>stand to less than or equal to 13 seconds (with UE support), for improved transfer efficiency and lower extremity strength.    Baseline  15.28 sec with UE support; 5/14:  13.06 sec with no UE support    Time  5    Period  Weeks    Status  Achieved      PT SHORT TERM GOAL #3   Title  Pt will improve TUG score to less than or equal to 18 seconds for decreased fall risk.    Baseline  24.5 sec; 5/14 15.54 sec with cane    Time  5    Period  Weeks    Status  Achieved      PT SHORT TERM GOAL #4   Title  Pt will improve DGI score to at least 16/24 for decreased fall risk.    Baseline  13/24    Time  5    Period  Weeks    Status  Achieved      PT SHORT TERM GOAL #5   Title  Pt will report at least 50% improvement in car transfers, for improved overall functional mobility.    Baseline  Pt reports at least 50% improvement in car transfers; husband notes her improvement.    Time  5    Period  Weeks    Status  Achieved      PT SHORT TERM  GOAL #6   Title  Pt will verbalize understanding of fall prevention in home environment, including tips to reducing freezing with gait.    Time  5    Period  Weeks    Status  Achieved        PT Long Term Goals - 01/12/20 1556      PT LONG TERM GOAL #1   Title  Pt will be independent with progression of Parkinson's specific exercise program to improve overall functional mobility.  TARGET 9 weeks: 03/11/2020 for all LTGs    Time  9    Period  Weeks    Status  New      PT LONG TERM GOAL #2   Title  Pt will perform at least 4 of 5 reps of sit<>stand from 18" chairs or lower, with minimal to no UE support, no LOB, for improved transfer efficiency and safety.    Time  9    Period  Weeks    Status  New      PT LONG TERM GOAL #3   Title  Pt will improve TUG score to less than or equal to 13.5 seconds for decreased fall risk.    Baseline  24.5 sec at eval    Time  9    Period  Weeks  Status  New      PT LONG TERM GOAL #4   Title  Pt will improve DGI score to at least 19/24 for decreased fall risk.    Baseline  13/24 at eval    Time  9    Period  Weeks    Status  New      PT LONG TERM GOAL #5   Title  Pt will improve gait velocity to at least 2.3 ft/sec for improved gait efficiency and safety.    Baseline  1.88 ft/sec at eval    Time  9    Period  Weeks    Status  New            Plan - 03/04/20 1150    Clinical Impression Statement  Pt tolerated full PT session well today with no reports of dizziness.  Reviewed pt's weightshifting activities from last visit and progressed to using those positions with quick stops/starts with gait as well as with turns.  Pt has improved balance with these activities and reports feeling overall more balanced.  She does have difficulty with dynamic SLS activities, requiring UE support.  She will continue to benefit from further skilled PT to address balance and gait towards LTGs.    Personal Factors and Comorbidities  Comorbidity 3+     Comorbidities  PMH includes breast cancer, anxiety, depression, L foot fracture, L wrist fracture from fall in December; OCD, osteopenia    Examination-Activity Limitations  Locomotion Level;Transfers;Stairs;Stand;Other   car transfers   Examination-Participation Restrictions  Community Activity;Other   Community exercise and walking in neighborhood   Stability/Clinical Decision Making  Evolving/Moderate complexity   PD dx in February 2021, not yet started on PD medications   Rehab Potential  Good    PT Frequency  2x / week    PT Duration  Other (comment)   9 weeks, including eval week   PT Treatment/Interventions  ADLs/Self Care Home Management;DME Instruction;Neuromuscular re-education;Balance training;Therapeutic exercise;Therapeutic activities;Functional mobility training;Stair training;Gait training;Patient/family education;Vestibular;Canalith Repostioning    PT Next Visit Plan  Continue weightshifting activities, continue using them with transitions and quick starts/stops with gait; gait over outdoor surfaces. Standing step strategies, arm swing. monitor orthostatic BP measures as needed; work towards [employer] and Agree with Plan of Care  Patient       Patient will benefit from skilled therapeutic intervention in order to improve the following deficits and impairments:  Abnormal gait, Difficulty walking, Impaired tone, Decreased balance, Decreased mobility, Decreased strength, Postural dysfunction, Dizziness  Visit Diagnosis: Unsteadiness on feet  Other abnormalities of gait and mobility     Problem List Patient Active Problem List   Diagnosis Date Noted  . Parkinson's disease ([place]) 12/30/2019  . Closed fracture of distal end of left radius 09/17/2019  . Closed fracture of styloid process of ulna 09/17/2019  . Insomnia 10/07/2017  . Dehydration 10/07/2017  . Port-A-Cath in place 09/30/2017  . Genetic testing 06/27/2017  . Family history of breast cancer   .  Malignant neoplasm of upper-inner quadrant of right breast in female, estrogen receptor positive ([place]) 06/26/2017    Monike Bragdon W. 03/04/2020, 11:55 AM  Frazier Butt., PT   [facility] [address] [place], [place], [address] Phone: [phone]   Fax:  [phone]  Name: Gloria Ware MRN: [id number] Date of Birth: [birth year]

## 2020-03-08 DIAGNOSIS — F411 Generalized anxiety disorder: Secondary | ICD-10-CM | POA: Diagnosis not present

## 2020-03-10 ENCOUNTER — Other Ambulatory Visit: Payer: Self-pay | Admitting: Oncology

## 2020-03-10 ENCOUNTER — Ambulatory Visit: Payer: Medicare Other | Admitting: Physical Therapy

## 2020-03-11 ENCOUNTER — Encounter: Payer: Self-pay | Admitting: Physical Therapy

## 2020-03-11 ENCOUNTER — Ambulatory Visit: Payer: Medicare Other | Attending: Neurology | Admitting: Physical Therapy

## 2020-03-11 ENCOUNTER — Other Ambulatory Visit: Payer: Self-pay

## 2020-03-11 DIAGNOSIS — M6281 Muscle weakness (generalized): Secondary | ICD-10-CM | POA: Insufficient documentation

## 2020-03-11 DIAGNOSIS — R29818 Other symptoms and signs involving the nervous system: Secondary | ICD-10-CM | POA: Insufficient documentation

## 2020-03-11 DIAGNOSIS — R2681 Unsteadiness on feet: Secondary | ICD-10-CM | POA: Diagnosis present

## 2020-03-11 DIAGNOSIS — R2689 Other abnormalities of gait and mobility: Secondary | ICD-10-CM | POA: Diagnosis present

## 2020-03-11 NOTE — Therapy (Addendum)
[facility] [address] [place], [place], [address] Phone: [phone]   Fax:  [phone]  Physical Therapy Treatment  Patient Details  Name: Gloria Ware MRN: [id number] Date of Birth: 1946/07/18 Referring Provider (PT): Alonza Bogus, DO   Encounter Date: 03/11/2020  PT End of Session - 03/11/20 1204    Visit Number  13    Number of Visits  18    Date for PT Re-Evaluation  42/68/34   90 day cert for 9 wk POC   Authorization Type  Medicare    Progress Note Due on Visit  10    PT Start Time  1103    PT Stop Time  1146    PT Time Calculation (min)  43 min    Equipment Utilized During Treatment  Gait belt    Activity Tolerance  Patient tolerated treatment well;Patient limited by fatigue    Behavior During Therapy  [facility] for tasks assessed/performed       Past Medical History:  Diagnosis Date  . Anxiety   . Breast CA ([place])   . Colon polyp 09/12/2005  . Depression   . Eczema    right forearm   . Genetic testing 06/27/2017   Multi-Cancer panel (83 genes) @ [facility] - No pathogenic mutations detected  . Hearing aid worn    B/L  . Hearing loss   . High cholesterol   . Injury     left foot metatarsal fracture sustined 08-03-17 , treated with boot; patient  reports  improvement as of today  . OCD (obsessive compulsive disorder)   . Osteopenia 05/2019   T score -2.0 FRAX 32% / 16%  . Personal history of chemotherapy   . Personal history of radiation therapy   . PONV (postoperative nausea and vomiting)    post op nausea; took a long time waking up  . Tinnitus     Past Surgical History:  Procedure Laterality Date  . BREAST SURGERY     Mastectomy-Right  . CARDIAC CATHETERIZATION     husband was having symtpoms and needed a stent; subsequently she states she started " to feel something"  but thinks it was " all in my head bc of my hbsand". subsequently saw same cardiologist as her husband who sent for treadmill  stress that had inconcluisicve results so she was sent for cardiac catherization which she states was " definitely negative "   . cataract surg    . COLONOSCOPY     with polypectomy  . DILATION AND CURETTAGE OF UTERUS    . IR CV LINE INJECTION  09/30/2017  . MASTECTOMY Right   . MASTECTOMY W/ SENTINEL NODE BIOPSY Right 08/06/2017   Procedure: RIGHT MASTECTOMY WITH SENTINEL LYMPH NODE BIOPSY;  Surgeon: [name], MD;  Location: [facility];  Service: General;  Laterality: Right;  . MOUTH SURGERY    . PORT-A-CATH REMOVAL Left 01/20/2018   Procedure: REMOVAL PORT-A-CATH;  Surgeon: [name], MD;  Location: [facility];  Service: General;  Laterality: Left;  . PORTACATH PLACEMENT N/A 09/12/2017   Procedure: INSERTION PORT-A-CATH;  Surgeon: [name], MD;  [facility];  Service: General;  Laterality: N/A;  . RADIOACTIVE SEED GUIDED AXILLARY SENTINEL LYMPH NODE Right 08/06/2017   Procedure: SEED TARGETED AXILLARY LYMPH NODE EXCISION;  Surgeon: [name], MD;  Location: [facility];  Service: General;  Laterality: Right;    There were no vitals filed for this visit.  Subjective Assessment -  03/11/20 1105    Subjective  Has been doing very well. Doing more on her own now - such as getting in and out of the shower. No problems with dizziness. Getting in and out of bed has been much easier. Still doesn't trust her balance.    Pertinent History  PMH includes breast cancer, anxiety, depression, L foot fracture, L wrist fracture from fall in December; OCD, osteopenia    Patient Stated Goals  Pt's goal for therapy is to get onto and out of a chair without arms, getting in and out of the car, walking comfortably and turning.    Currently in Pain?  No/denies    Pain Onset  More than a month ago                03/11/20 0001  Ambulation/Gait  Ambulation/Gait Yes  Ambulation/Gait Assistance 5: Supervision;4: Min guard  Ambulation/Gait Assistance Details  ambulated outdoors over pavement, with performing quick start/stops with pt using lateral weight shift and staggering stance weight shift to continue gait. also ambulated over unlevel surfaces such as grass, gravel, and mulch with cues for incr foot clearance when ambulating over grass, no LOB  Ambulation Distance (Feet) 400 Feet  Assistive device Straight cane (with tripod base)  Gait Pattern Step-through pattern;Decreased arm swing - left;Decreased step length - right;Decreased step length - left;Narrow base of support;Decreased arm swing - right  Ambulation Surface Unlevel;Outdoor;Paved;Gravel;Grass  Curb 5: Supervision  Curb Details (indicate cue type and reason) initial cues needed for technique, as pt initially demonstrating ascending a curb laterally, cues for sequencing with SPC x4 reps  High Level Balance  High Level Balance Comments Standing on red compliant surface: standing marching in place 2 x 10 reps with use of visual cue of boomwhacker for incr ROM and intensity, 2nd rep performed without UE support and min guard, down and back marching on mat with fingertip support x4 reps with use of boomwhacker as visual cue, 1st rep performed without it and pt demo decr hip/knee flexion when marching. Cues for incr foot clearance when turning. With BUE support: alternating lateral stepping strategy with verbal and demo cues for weight shift and step height. Single UE support: retro gait down and back x3 reps with cues for step width and weight shift, cues for incr foot clearance when marching              PT Short Term Goals - 02/23/20 1405      PT SHORT TERM GOAL #1   Title  Pt will be independent with HEP for improved transfers, strength, balance, and gait.  TARGET 02/12/2020 for all STGs    Time  5    Period  Weeks    Status  Achieved      PT SHORT TERM GOAL #2   Title  Pt will improve 5x sit<>stand to less than or equal to 13 seconds (with UE support), for improved transfer  efficiency and lower extremity strength.    Baseline  15.28 sec with UE support; 5/14:  13.06 sec with no UE support    Time  5    Period  Weeks    Status  Achieved      PT SHORT TERM GOAL #3   Title  Pt will improve TUG score to less than or equal to 18 seconds for decreased fall risk.    Baseline  24.5 sec; 5/14 15.54 sec with cane    Time  5    Period  Weeks    Status  Achieved      PT SHORT TERM GOAL #4   Title  Pt will improve DGI score to at least 16/24 for decreased fall risk.    Baseline  13/24    Time  5    Period  Weeks    Status  Achieved      PT SHORT TERM GOAL #5   Title  Pt will report at least 50% improvement in car transfers, for improved overall functional mobility.    Baseline  Pt reports at least 50% improvement in car transfers; husband notes her improvement.    Time  5    Period  Weeks    Status  Achieved      PT SHORT TERM GOAL #6   Title  Pt will verbalize understanding of fall prevention in home environment, including tips to reducing freezing with gait.    Time  5    Period  Weeks    Status  Achieved       PT Long Term Goals - 03/13/20 2109      PT LONG TERM GOAL #1   Title  Pt will be independent with progression of Parkinson's specific exercise program to improve overall functional mobility.  TARGET 9 weeks: 03/25/2020 for all LTGs    Time  9    Period  Weeks    Status  New      PT LONG TERM GOAL #2   Title  Pt will perform at least 4 of 5 reps of sit<>stand from 18" chairs or lower, with minimal to no UE support, no LOB, for improved transfer efficiency and safety.    Time  9    Period  Weeks    Status  New      PT LONG TERM GOAL #3   Title  Pt will improve TUG score to less than or equal to 13.5 seconds for decreased fall risk.    Baseline  24.5 sec at eval    Time  9    Period  Weeks    Status  New      PT LONG TERM GOAL #4   Title  Pt will improve DGI score to at least 19/24 for decreased fall risk.    Baseline  13/24 at eval     Time  9    Period  Weeks    Status  New      PT LONG TERM GOAL #5   Title  Pt will improve gait velocity to at least 2.3 ft/sec for improved gait efficiency and safety.    Baseline  1.88 ft/sec at eval    Time  9    Period  Weeks    Status  New            03/13/20 2106  Plan  Clinical Impression Statement Focus of today's skilled session was gait training outdoors over unlevel surfaces and standing balance strategies on compliant surfaces. Pt with no LOB while ambulating on grass, mulch, or gravel. Pt needing intermittent rest breaks throughout session due to fatigue, but no dizziness reported. Revised LTG dates on pt's POC as this is only week 6 (due to delay in scheduling) and pt's POC goes through week 9. Will continue to progress towards LTGs.  Personal Factors and Comorbidities Comorbidity 3+  Comorbidities PMH includes breast cancer, anxiety, depression, L foot fracture, L wrist fracture from fall in December; OCD, osteopenia  Examination-Activity Limitations Locomotion Level;Transfers;Stairs;Stand;Other (car transfers)  Examination-Participation Restrictions Community Activity;Other (Community exercise and walking in neighborhood)  Pt will benefit from skilled therapeutic intervention in order to improve on the following deficits Abnormal gait;Difficulty walking;Impaired tone;Decreased balance;Decreased mobility;Decreased strength;Postural dysfunction;Dizziness  Stability/Clinical Decision Making Evolving/Moderate complexity (PD dx in February 2021, not yet started on PD medications)  Rehab Potential Good  PT Frequency 2x / week  PT Duration Other (comment) (9 weeks, including eval week)  PT Treatment/Interventions ADLs/Self Care Home Management;DME Instruction;Neuromuscular re-education;Balance training;Therapeutic exercise;Therapeutic activities;Functional mobility training;Stair training;Gait training;Patient/family education;Vestibular;Canalith Repostioning  PT Next Visit  Plan Continue weightshifting activities, continue using them with transitions and quick starts/stops with gait; gait over outdoor surfaces. Standing step strategies, arm swing. monitor orthostatic BP measures as needed; work towards [employer] and Agree with Plan of Care Patient        Patient will benefit from skilled therapeutic intervention in order to improve the following deficits and impairments:     Visit Diagnosis: Unsteadiness on feet  Other abnormalities of gait and mobility  Other symptoms and signs involving the nervous system  Muscle weakness (generalized)     Problem List Patient Active Problem List   Diagnosis Date Noted  . Parkinson's disease ([place]) 12/30/2019  . Closed fracture of distal end of left radius 09/17/2019  . Closed fracture of styloid process of ulna 09/17/2019  . Insomnia 10/07/2017  . Dehydration 10/07/2017  . Port-A-Cath in place 09/30/2017  . Genetic testing 06/27/2017  . Family history of breast cancer   . Malignant neoplasm of upper-inner quadrant of right breast in female, estrogen receptor positive ([place]) 06/26/2017    Arliss Journey, PT, DPT  03/11/2020, 12:05 PM  [facility] [address] [place], [place], [address] Phone: [phone]   Fax:  [phone]  Name: Gloria Ware MRN: [id number] Date of Birth: [birth year]

## 2020-03-15 ENCOUNTER — Ambulatory Visit: Payer: Medicare Other | Admitting: Physical Therapy

## 2020-03-15 ENCOUNTER — Other Ambulatory Visit: Payer: Self-pay

## 2020-03-15 ENCOUNTER — Encounter: Payer: Self-pay | Admitting: Physical Therapy

## 2020-03-15 DIAGNOSIS — R2681 Unsteadiness on feet: Secondary | ICD-10-CM

## 2020-03-15 DIAGNOSIS — M6281 Muscle weakness (generalized): Secondary | ICD-10-CM

## 2020-03-15 DIAGNOSIS — R29818 Other symptoms and signs involving the nervous system: Secondary | ICD-10-CM

## 2020-03-15 DIAGNOSIS — R2689 Other abnormalities of gait and mobility: Secondary | ICD-10-CM

## 2020-03-15 NOTE — Patient Instructions (Signed)
Access Code: [email or web address] URL: [email or web address] Date: 03/15/2020 Prepared by: Janann August  Exercises Staggered Stance Forward Backward Weight Shift with Counter Support - 2 x daily - 5 x weekly - 1 sets - 10 reps Side to Side Weight Shift with Counter Support - 2 x daily - 5 x weekly - 1 sets - 10 reps Side Stepping with Counter Support - 1 x daily - 5 x weekly - 1 sets - 10 reps Alternating Step Forward with Support - 1 x daily - 5 x weekly - 1 sets - 10 reps

## 2020-03-15 NOTE — Therapy (Signed)
[facility] [address] [place], [place], [address] Phone: [phone]   Fax:  [phone]  Physical Therapy Treatment  Patient Details  Name: Gloria Ware MRN: [id number] Date of Birth: [birth year] Referring Provider (PT): Alonza Bogus, DO   Encounter Date: 03/15/2020  PT End of Session - 03/15/20 1435    Visit Number  14    Number of Visits  18    Date for PT Re-Evaluation  15/40/08   90 day cert for 9 wk POC   Authorization Type  Medicare    Progress Note Due on Visit  10    PT Start Time  1103    PT Stop Time  1145    PT Time Calculation (min)  42 min    Equipment Utilized During Treatment  Gait belt    Activity Tolerance  Patient tolerated treatment well   felt mild dizziness after stair training   Behavior During Therapy  WFL for tasks assessed/performed       Past Medical History:  Diagnosis Date  . Anxiety   . Breast CA ([place])   . Colon polyp 09/12/2005  . Depression   . Eczema    right forearm   . Genetic testing 06/27/2017   Multi-Cancer panel (83 genes) @ [facility] - No pathogenic mutations detected  . Hearing aid worn    B/L  . Hearing loss   . High cholesterol   . Injury     left foot metatarsal fracture sustined 08-03-17 , treated with boot; patient  reports  improvement as of today  . OCD (obsessive compulsive disorder)   . Osteopenia 05/2019   T score -2.0 FRAX 32% / 16%  . Personal history of chemotherapy   . Personal history of radiation therapy   . PONV (postoperative nausea and vomiting)    post op nausea; took a long time waking up  . Tinnitus     Past Surgical History:  Procedure Laterality Date  . BREAST SURGERY     Mastectomy-Right  . CARDIAC CATHETERIZATION     husband was having symtpoms and needed a stent; subsequently she states she started " to feel something"  but thinks it was " all in my head bc of my hbsand". subsequently saw same cardiologist as her husband who  sent for treadmill stress that had inconcluisicve results so she was sent for cardiac catherization which she states was " definitely negative "   . cataract surg    . COLONOSCOPY     with polypectomy  . DILATION AND CURETTAGE OF UTERUS    . IR CV LINE INJECTION  09/30/2017  . MASTECTOMY Right   . MASTECTOMY W/ SENTINEL NODE BIOPSY Right 08/06/2017   Procedure: RIGHT MASTECTOMY WITH SENTINEL LYMPH NODE BIOPSY;  Surgeon: [name], MD;  Location: [facility];  Service: General;  Laterality: Right;  . MOUTH SURGERY    . PORT-A-CATH REMOVAL Left 01/20/2018   Procedure: REMOVAL PORT-A-CATH;  Surgeon: [name], MD;  Location: [facility];  Service: General;  Laterality: Left;  . PORTACATH PLACEMENT N/A 09/12/2017   Procedure: INSERTION PORT-A-CATH;  Surgeon: [name], MD;  Location: [facility];  Service: General;  Laterality: N/A;  . RADIOACTIVE SEED GUIDED AXILLARY SENTINEL LYMPH NODE Right 08/06/2017   Procedure: SEED TARGETED AXILLARY LYMPH NODE EXCISION;  Surgeon: [name], MD;  Location: [facility];  Service: General;  Laterality: Right;    There were no vitals filed for this  visit.  Subjective Assessment - 03/15/20 1105    Subjective  Didn't have a lot of energy yesterday. Was tired and took a long nap. No dizziness. Feeling much better today. No falls.    Pertinent History  PMH includes breast cancer, anxiety, depression, L foot fracture, L wrist fracture from fall in December; OCD, osteopenia    Patient Stated Goals  Pt's goal for therapy is to get onto and out of a chair without arms, getting in and out of the car, walking comfortably and turning.    Currently in Pain?  No/denies    Pain Onset  More than a month ago                        [facility] Adult PT Treatment/Exercise - 03/15/20 1128      Ambulation/Gait   Ambulation/Gait  Yes    Ambulation/Gait Assistance  5: Supervision    Ambulation/Gait Assistance Details  ambulated  clinic distances - initial cues provided for heel strike    Assistive device  Straight cane   with tripod base   Gait Pattern  Step-through pattern;Decreased arm swing - left;Decreased step length - right;Decreased step length - left;Narrow base of support;Decreased arm swing - right    Ambulation Surface  Level;Indoor    Stairs  Yes    Stairs Assistance  5: Supervision;4: Min guard    Stairs Assistance Details (indicate cue type and reason)  ascends with single handrail and step through pattern. descending with step to pattern, reporting mild dizziness when having to look down, however did improve with incr reps. pt reporting feeling more stable when descending when leading with RLE    Stair Management Technique  Two rails;Alternating pattern;Forwards;Step to pattern    Number of Stairs  12    Height of Stairs  6      High Level Balance   High Level Balance Comments  Standing at bottom of staircase on blue foam: beginning with BUE support alternating toe taps to 1st step and then single UE support and then progressing to no UE support total x15 reps B. progressing to placing single foot onto first step and then holding modified SLS x10 seconds with intermittent UE support - 3 reps B.  At countertop with BUE support: alternating posterior stepping strategy x10 reps B         Access Code: [email or web address] URL: [email or web address] Date: 03/15/2020 Prepared by: Janann August  New bolded additions to HEP for stepping strategies for balance:   Exercises Staggered Stance Forward Backward Weight Shift with Counter Support - 2 x daily - 5 x weekly - 1 sets - 10 reps Side to Side Weight Shift with Counter Support - 2 x daily - 5 x weekly - 1 sets - 10 reps Side Stepping with Counter Support - 1 x daily - 5 x weekly - 1 sets - 10 reps - stepping out to R and then back to middle and stepping out and weight shift to L and back to middle, cues for foot clearance and a wider BOS when  returning to midline  Alternating Step Forward with Support - 1 x daily - 5 x weekly - 1 sets - 10 reps - with single UE support at counter, cues for incr foot clearance and heel strike and weight shifting before stepping back to midline          PT Education - 03/15/20 1435    Education Details  stair training, stepping  strategy additions to HEP    Person(s) Educated  Patient    Methods  Explanation;Demonstration;Handout;Verbal cues    Comprehension  Verbalized understanding;Returned demonstration       PT Short Term Goals - 02/23/20 1405      PT SHORT TERM GOAL #1   Title  Pt will be independent with HEP for improved transfers, strength, balance, and gait.  TARGET 02/12/2020 for all STGs    Time  5    Period  Weeks    Status  Achieved      PT SHORT TERM GOAL #2   Title  Pt will improve 5x sit<>stand to less than or equal to 13 seconds (with UE support), for improved transfer efficiency and lower extremity strength.    Baseline  15.28 sec with UE support; 5/14:  13.06 sec with no UE support    Time  5    Period  Weeks    Status  Achieved      PT SHORT TERM GOAL #3   Title  Pt will improve TUG score to less than or equal to 18 seconds for decreased fall risk.    Baseline  24.5 sec; 5/14 15.54 sec with cane    Time  5    Period  Weeks    Status  Achieved      PT SHORT TERM GOAL #4   Title  Pt will improve DGI score to at least 16/24 for decreased fall risk.    Baseline  13/24    Time  5    Period  Weeks    Status  Achieved      PT SHORT TERM GOAL #5   Title  Pt will report at least 50% improvement in car transfers, for improved overall functional mobility.    Baseline  Pt reports at least 50% improvement in car transfers; husband notes her improvement.    Time  5    Period  Weeks    Status  Achieved      PT SHORT TERM GOAL #6   Title  Pt will verbalize understanding of fall prevention in home environment, including tips to reducing freezing with gait.    Time  5     Period  Weeks    Status  Achieved        PT Long Term Goals - 03/13/20 2109      PT LONG TERM GOAL #1   Title  Pt will be independent with progression of Parkinson's specific exercise program to improve overall functional mobility.  TARGET 9 weeks: 03/25/2020 for all LTGs    Time  9    Period  Weeks    Status  New      PT LONG TERM GOAL #2   Title  Pt will perform at least 4 of 5 reps of sit<>stand from 18" chairs or lower, with minimal to no UE support, no LOB, for improved transfer efficiency and safety.    Time  9    Period  Weeks    Status  New      PT LONG TERM GOAL #3   Title  Pt will improve TUG score to less than or equal to 13.5 seconds for decreased fall risk.    Baseline  24.5 sec at eval    Time  9    Period  Weeks    Status  New      PT LONG TERM GOAL #4   Title  Pt will improve DGI score  to at least 19/24 for decreased fall risk.    Baseline  13/24 at eval    Time  9    Period  Weeks    Status  New      PT LONG TERM GOAL #5   Title  Pt will improve gait velocity to at least 2.3 ft/sec for improved gait efficiency and safety.    Baseline  1.88 ft/sec at eval    Time  9    Period  Weeks    Status  New            Plan - 03/15/20 1448    Clinical Impression Statement  Added forward and lateral stepping strategies with countertop support to pt's HEP with pt able to demo correctly, with initial cues for foot clearance. Performed stair training today as pt reports she is going to see family at the end of the month and there are stairs upstairs (if her bedroom is upstairs, but may be able to be on the ground floor). Pt able to ascend with step through pattern with supervision ascending, when descending educated on step to pattern for balance with single handrail. Pt reporting minimal dizziness when looking down, but did improve with incr reps. Did require seated rest break afterwards. Will continue to progress towards LTGs.    Personal Factors and  Comorbidities  Comorbidity 3+    Comorbidities  PMH includes breast cancer, anxiety, depression, L foot fracture, L wrist fracture from fall in December; OCD, osteopenia    Examination-Activity Limitations  Locomotion Level;Transfers;Stairs;Stand;Other   car transfers   Examination-Participation Restrictions  Community Activity;Other   Community exercise and walking in neighborhood   Stability/Clinical Decision Making  Evolving/Moderate complexity   PD dx in February 2021, not yet started on PD medications   Rehab Potential  Good    PT Frequency  2x / week    PT Duration  Other (comment)   9 weeks, including eval week   PT Treatment/Interventions  ADLs/Self Care Home Management;DME Instruction;Neuromuscular re-education;Balance training;Therapeutic exercise;Therapeutic activities;Functional mobility training;Stair training;Gait training;Patient/family education;Vestibular;Canalith Repostioning    PT Next Visit Plan  how were new additions to HEP? stair training (maybe try sideways descending due to incr dizziness coming down forwards). Continue weightshifting activities, continue using them with transitions and quick starts/stops with gait; gait over outdoor surfaces. Standing step strategies, arm swing. monitor orthostatic BP measures as needed; work towards [employer] and Agree with Plan of Care  Patient       Patient will benefit from skilled therapeutic intervention in order to improve the following deficits and impairments:  Abnormal gait, Difficulty walking, Impaired tone, Decreased balance, Decreased mobility, Decreased strength, Postural dysfunction, Dizziness  Visit Diagnosis: Unsteadiness on feet  Other abnormalities of gait and mobility  Other symptoms and signs involving the nervous system  Muscle weakness (generalized)     Problem List Patient Active Problem List   Diagnosis Date Noted  . Parkinson's disease ([place]) 12/30/2019  . Closed fracture of distal end of  left radius 09/17/2019  . Closed fracture of styloid process of ulna 09/17/2019  . Insomnia 10/07/2017  . Dehydration 10/07/2017  . Port-A-Cath in place 09/30/2017  . Genetic testing 06/27/2017  . Family history of breast cancer   . Malignant neoplasm of upper-inner quadrant of right breast in female, estrogen receptor positive ([place]) 06/26/2017    Arliss Journey, PT, DPT  03/15/2020, 2:51 PM  [facility] [address]  [place], [place], [address] Phone: [phone]   Fax:  [phone]  Name: Gloria Ware MRN: [id number] Date of Birth: [birth year]

## 2020-03-18 ENCOUNTER — Ambulatory Visit: Payer: Medicare Other | Admitting: Physical Therapy

## 2020-03-18 ENCOUNTER — Other Ambulatory Visit: Payer: Self-pay

## 2020-03-18 DIAGNOSIS — R2689 Other abnormalities of gait and mobility: Secondary | ICD-10-CM

## 2020-03-18 DIAGNOSIS — R2681 Unsteadiness on feet: Secondary | ICD-10-CM

## 2020-03-18 DIAGNOSIS — R29818 Other symptoms and signs involving the nervous system: Secondary | ICD-10-CM

## 2020-03-18 NOTE — Therapy (Signed)
[facility] [address] [place], [place], [address] Phone: [phone]   Fax:  [phone]  Physical Therapy Treatment  Patient Details  Name: Gloria Ware MRN: [id number] Date of Birth: 01/25/1946 Referring Provider (PT): Alonza Bogus, DO   Encounter Date: 03/18/2020   PT End of Session - 03/18/20 1447    Visit Number 15    Number of Visits 18    Date for PT Re-Evaluation 46/50/35   90 day cert for 9 wk POC   Authorization Type Medicare    PT Start Time 1109    PT Stop Time 1148    PT Time Calculation (min) 39 min    Activity Tolerance Patient tolerated treatment well   felt mild dizziness after stair training, resolved with repetitions   Behavior During Therapy [facility] for tasks assessed/performed           Past Medical History:  Diagnosis Date  . Anxiety   . Breast CA ([place])   . Colon polyp 09/12/2005  . Depression   . Eczema    right forearm   . Genetic testing 06/27/2017   Multi-Cancer panel (83 genes) @ [facility] - No pathogenic mutations detected  . Hearing aid worn    B/L  . Hearing loss   . High cholesterol   . Injury     left foot metatarsal fracture sustined 08-03-17 , treated with boot; patient  reports  improvement as of today  . OCD (obsessive compulsive disorder)   . Osteopenia 05/2019   T score -2.0 FRAX 32% / 16%  . Personal history of chemotherapy   . Personal history of radiation therapy   . PONV (postoperative nausea and vomiting)    post op nausea; took a long time waking up  . Tinnitus     Past Surgical History:  Procedure Laterality Date  . BREAST SURGERY     Mastectomy-Right  . CARDIAC CATHETERIZATION     husband was having symtpoms and needed a stent; subsequently she states she started " to feel something"  but thinks it was " all in my head bc of my hbsand". subsequently saw same cardiologist as her husband who sent for treadmill stress that had inconcluisicve results so she  was sent for cardiac catherization which she states was " definitely negative "   . cataract surg    . COLONOSCOPY     with polypectomy  . DILATION AND CURETTAGE OF UTERUS    . IR CV LINE INJECTION  09/30/2017  . MASTECTOMY Right   . MASTECTOMY W/ SENTINEL NODE BIOPSY Right 08/06/2017   Procedure: RIGHT MASTECTOMY WITH SENTINEL LYMPH NODE BIOPSY;  Surgeon: [name], MD;  Location: [facility];  Service: General;  Laterality: Right;  . MOUTH SURGERY    . PORT-A-CATH REMOVAL Left 01/20/2018   Procedure: REMOVAL PORT-A-CATH;  Surgeon: [name], MD;  Location: [facility];  Service: General;  Laterality: Left;  . PORTACATH PLACEMENT N/A 09/12/2017   Procedure: INSERTION PORT-A-CATH;  Surgeon: [name], MD;  [facility];  Service: General;  Laterality: N/A;  . RADIOACTIVE SEED GUIDED AXILLARY SENTINEL LYMPH NODE Right 08/06/2017   Procedure: SEED TARGETED AXILLARY LYMPH NODE EXCISION;  Surgeon: [name], MD;  Location: [facility];  Service: General;  Laterality: Right;    There were no vitals filed for this visit.   Subjective Assessment - 03/18/20 1110    Subjective No changes,no falls.    Pertinent History PMH  includes breast cancer, anxiety, depression, L foot fracture, L wrist fracture from fall in December; OCD, osteopenia    Patient Stated Goals Pt's goal for therapy is to get onto and out of a chair without arms, getting in and out of the car, walking comfortably and turning.    Currently in Pain? No/denies    Pain Onset More than a month ago                             [facility] Adult PT Treatment/Exercise - 03/18/20 1133      Ambulation/Gait   Ambulation/Gait Yes    Stairs Yes    Stairs Assistance 5: Supervision;4: Min guard    Stairs Assistance Details (indicate cue type and reason) Pt initial c/o dizzines upon descending stairs, but improves with repetition.  Practiced stairs as she would have at son's home (flight  with L or R handrail).  Pt prefers L handrail, ascending and descending with step through pattern.  Attempted descending steps with step-to pattern forward and step-to pattern sideways, but she feels more comfortable descending with step-thorugh pattern and 1 rail.  PT advised to have family member nearby for safety since she doesn't typically negotiate steps.    Stair Management Technique Two rails;Alternating pattern;Forwards;Step to pattern;One rail Right    Number of Stairs 12   x 2   Height of Stairs 6      Self-Care   Self-Care Other Self-Care Comments    Other Self-Care Comments  Discussed patient's POC and her concerns of potential d/c next week; she will be travelling for approx 2 weeks and would like to return for several visits as a check-in when she gets back into town.  Discussed we will likely do that (will need to extend her POC), and discussed our return PD evals 6-8 months after discharge as a means of checking in/tune-up for therapy.  Discussed local and online Parkinson's disease resources.            Neuro Re-education Reviewed HEP given last visit:  . Staggered Stance Forward Backward Weight Shift with Counter Support - 2 x daily - 5 x weekly - 1 sets - 10 reps . Side to Side Weight Shift with Counter Support - 2 x daily - 5 x weekly - 1 sets - 10 reps . Side Stepping with Counter Support - 1 x daily - 5 x weekly - 1 sets - 10 reps (performed 10 reps with alternating step out and return to midline, then performed sidestepping along counter R and L x 1 rep each).  Cues for increased weightshifting upon stepping out to the side (almost a lunge position), for smoother step/weightshift motion.  Cues for widened BOS upon return to midline . Alternating Step Forward with Support - 1 x daily - 5 x weekly - 1 sets - 10 reps Cues for increased weightshifting upon stepping forward (almost a lunge position), for smoother step/weigthshift motion.  Cues for widened BOS upon return to  midline.   Pt return demo understanding with cues    PT Education - 03/18/20 1446    Education Details Stair training, review of HEP; local and online PD resources, POC (with likely extension of POC for follow up upon return from her trip)    Person(s) Educated Patient    Methods Explanation;Handout;Demonstration;Verbal cues    Comprehension Verbalized understanding;Returned demonstration            PT Short Term  Goals - 02/23/20 1405      PT SHORT TERM GOAL #1   Title Pt will be independent with HEP for improved transfers, strength, balance, and gait.  TARGET 02/12/2020 for all STGs    Time 5    Period Weeks    Status Achieved      PT SHORT TERM GOAL #2   Title Pt will improve 5x sit<>stand to less than or equal to 13 seconds (with UE support), for improved transfer efficiency and lower extremity strength.    Baseline 15.28 sec with UE support; 5/14:  13.06 sec with no UE support    Time 5    Period Weeks    Status Achieved      PT SHORT TERM GOAL #3   Title Pt will improve TUG score to less than or equal to 18 seconds for decreased fall risk.    Baseline 24.5 sec; 5/14 15.54 sec with cane    Time 5    Period Weeks    Status Achieved      PT SHORT TERM GOAL #4   Title Pt will improve DGI score to at least 16/24 for decreased fall risk.    Baseline 13/24    Time 5    Period Weeks    Status Achieved      PT SHORT TERM GOAL #5   Title Pt will report at least 50% improvement in car transfers, for improved overall functional mobility.    Baseline Pt reports at least 50% improvement in car transfers; husband notes her improvement.    Time 5    Period Weeks    Status Achieved      PT SHORT TERM GOAL #6   Title Pt will verbalize understanding of fall prevention in home environment, including tips to reducing freezing with gait.    Time 5    Period Weeks    Status Achieved             PT Long Term Goals - 03/13/20 2109      PT LONG TERM GOAL #1   Title Pt will  be independent with progression of Parkinson's specific exercise program to improve overall functional mobility.  TARGET 9 weeks: 03/25/2020 for all LTGs    Time 9    Period Weeks    Status New      PT LONG TERM GOAL #2   Title Pt will perform at least 4 of 5 reps of sit<>stand from 18" chairs or lower, with minimal to no UE support, no LOB, for improved transfer efficiency and safety.    Time 9    Period Weeks    Status New      PT LONG TERM GOAL #3   Title Pt will improve TUG score to less than or equal to 13.5 seconds for decreased fall risk.    Baseline 24.5 sec at eval    Time 9    Period Weeks    Status New      PT LONG TERM GOAL #4   Title Pt will improve DGI score to at least 19/24 for decreased fall risk.    Baseline 13/24 at eval    Time 9    Period Weeks    Status New      PT LONG TERM GOAL #5   Title Pt will improve gait velocity to at least 2.3 ft/sec for improved gait efficiency and safety.    Baseline 1.88 ft/sec at eval    Time 9  Period Weeks    Status New                 Plan - 03/18/20 1448    Clinical Impression Statement Reviewed HEP, with pt asking appropriate questions to clarify technique.  Cues provided for optimal technique, with step and weightshifting exercises.  Also continued to work on stairs, as she will have a flight of stairs at son's home; she is able to ascend and descend today with L handrail and step through pattern, with cues give for supervision from family with stair negoitation.  Plan for checking LTGs next week; pt would like to return to PT 1-2 visits after return from her trip to follow up and then plan for d/c.  (will need renewal at end of next week for that).    Personal Factors and Comorbidities Comorbidity 3+    Comorbidities PMH includes breast cancer, anxiety, depression, L foot fracture, L wrist fracture from fall in December; OCD, osteopenia    Examination-Activity Limitations Locomotion  Level;Transfers;Stairs;Stand;Other   car transfers   Examination-Participation Restrictions Community Activity;Other   Community exercise and walking in neighborhood   Stability/Clinical Decision Making Evolving/Moderate complexity   PD dx in February 2021, not yet started on PD medications   Rehab Potential Good    PT Frequency 2x / week    PT Duration Other (comment)   9 weeks, including eval week   PT Treatment/Interventions ADLs/Self Care Home Management;DME Instruction;Neuromuscular re-education;Balance training;Therapeutic exercise;Therapeutic activities;Functional mobility training;Stair training;Gait training;Patient/family education;Vestibular;Canalith Repostioning    PT Next Visit Plan Review HEP, review fall prevention (as pt will be travelling), check LTGs; will need to renew at end of week, for planned 1-2 visits after return from her trip to check in on mobility, HEP    Consulted and Agree with Plan of Care Patient           Patient will benefit from skilled therapeutic intervention in order to improve the following deficits and impairments:  Abnormal gait, Difficulty walking, Impaired tone, Decreased balance, Decreased mobility, Decreased strength, Postural dysfunction, Dizziness  Visit Diagnosis: Unsteadiness on feet  Other abnormalities of gait and mobility  Other symptoms and signs involving the nervous system     Problem List Patient Active Problem List   Diagnosis Date Noted  . Parkinson's disease ([place]) 12/30/2019  . Closed fracture of distal end of left radius 09/17/2019  . Closed fracture of styloid process of ulna 09/17/2019  . Insomnia 10/07/2017  . Dehydration 10/07/2017  . Port-A-Cath in place 09/30/2017  . Genetic testing 06/27/2017  . Family history of breast cancer   . Malignant neoplasm of upper-inner quadrant of right breast in female, estrogen receptor positive ([place]) 06/26/2017    Mabry Santarelli W. 03/18/2020, 2:53 PM Frazier Butt., PT [facility] [address] [place], [place], [address] Phone: [phone]   Fax:  [phone]  Name: Gloria Ware MRN: [id number] Date of Birth: 15-Mar-1946

## 2020-03-22 ENCOUNTER — Ambulatory Visit: Payer: Medicare Other | Admitting: Physical Therapy

## 2020-03-22 ENCOUNTER — Other Ambulatory Visit: Payer: Self-pay

## 2020-03-22 ENCOUNTER — Encounter: Payer: Self-pay | Admitting: Physical Therapy

## 2020-03-22 DIAGNOSIS — R29818 Other symptoms and signs involving the nervous system: Secondary | ICD-10-CM

## 2020-03-22 DIAGNOSIS — R2689 Other abnormalities of gait and mobility: Secondary | ICD-10-CM

## 2020-03-22 DIAGNOSIS — M6281 Muscle weakness (generalized): Secondary | ICD-10-CM

## 2020-03-22 DIAGNOSIS — R2681 Unsteadiness on feet: Secondary | ICD-10-CM | POA: Diagnosis not present

## 2020-03-22 NOTE — Therapy (Signed)
[facility] [address] [place], [place], [address] Phone: [phone]   Fax:  [phone]  Physical Therapy Treatment  Patient Details  Name: Gloria Ware MRN: [id number] Date of Birth: 01-Dec-1945 Referring Provider (PT): Alonza Bogus, DO   Encounter Date: 03/22/2020   PT End of Session - 03/22/20 1127    Visit Number 16    Number of Visits 18    Date for PT Re-Evaluation 12/26/20   90 day cert for 9 wk POC   Authorization Type Medicare    PT Start Time [phone]   pt arrived late   PT Stop Time 1015    PT Time Calculation (min) 39 min    Activity Tolerance Patient tolerated treatment well    Behavior During Therapy [id number] for tasks assessed/performed           Past Medical History:  Diagnosis Date  . Anxiety   . Breast CA ([place])   . Colon polyp 09/12/2005  . Depression   . Eczema    right forearm   . Genetic testing 06/27/2017   Multi-Cancer panel (83 genes) @ [facility] - No pathogenic mutations detected  . Hearing aid worn    B/L  . Hearing loss   . High cholesterol   . Injury     left foot metatarsal fracture sustined 08-03-17 , treated with boot; patient  reports  improvement as of today  . OCD (obsessive compulsive disorder)   . Osteopenia 05/2019   T score -2.0 FRAX 32% / 16%  . Personal history of chemotherapy   . Personal history of radiation therapy   . PONV (postoperative nausea and vomiting)    post op nausea; took a long time waking up  . Tinnitus     Past Surgical History:  Procedure Laterality Date  . BREAST SURGERY     Mastectomy-Right  . CARDIAC CATHETERIZATION     husband was having symtpoms and needed a stent; subsequently she states she started " to feel something"  but thinks it was " all in my head bc of my hbsand". subsequently saw same cardiologist as her husband who sent for treadmill stress that had inconcluisicve results so she was sent for cardiac catherization which she states  was " definitely negative "   . cataract surg    . COLONOSCOPY     with polypectomy  . DILATION AND CURETTAGE OF UTERUS    . IR CV LINE INJECTION  09/30/2017  . MASTECTOMY Right   . MASTECTOMY W/ SENTINEL NODE BIOPSY Right 08/06/2017   Procedure: RIGHT MASTECTOMY WITH SENTINEL LYMPH NODE BIOPSY;  Surgeon: [name], MD;  Location: [place];  Service: General;  Laterality: Right;  . MOUTH SURGERY    . PORT-A-CATH REMOVAL Left 01/20/2018   Procedure: REMOVAL PORT-A-CATH;  Surgeon: [name], MD;  Location: [facility];  Service: General;  Laterality: Left;  . PORTACATH PLACEMENT N/A 09/12/2017   Procedure: INSERTION PORT-A-CATH;  Surgeon: [name], MD;  Location: [facility];  Service: General;  Laterality: N/A;  . RADIOACTIVE SEED GUIDED AXILLARY SENTINEL LYMPH NODE Right 08/06/2017   Procedure: SEED TARGETED AXILLARY LYMPH NODE EXCISION;  Surgeon: [name], MD;  Location: [facility];  Service: General;  Laterality: Right;    There were no vitals filed for this visit.   Subjective Assessment - 03/22/20 0939    Subjective Is a little nervous about her trip next week. No dizziness.    Pertinent  History PMH includes breast cancer, anxiety, depression, L foot fracture, L wrist fracture from fall in December; OCD, osteopenia    Patient Stated Goals Pt's goal for therapy is to get onto and out of a chair without arms, getting in and out of the car, walking comfortably and turning.    Currently in Pain? No/denies    Pain Onset More than a month ago              [facility] PT Assessment - 03/22/20 0945      Timed Up and Go Test   Normal TUG (seconds) 15.03   first rep, 12.22 second rep   TUG Comments with SPC with tripod base                         [facility] Adult PT Treatment/Exercise - 03/22/20 0945      Transfers   Transfers Sit to Stand;Stand to Sit    Sit to Stand 6: Modified independent (Device/Increase time);Without upper extremity  assist;From bed;Other (comment)   from lower height chair   Five time sit to stand comments  5 reps of sit <> stand with no UE support from lower height chair, no LOB.    Stand to Sit 5: Supervision;Without upper extremity assist;To bed      Ambulation/Gait   Ambulation/Gait Yes    Ambulation/Gait Assistance 5: Supervision    Ambulation Distance (Feet) 200 Feet   approx. throughout session   Assistive device Straight cane   with tripod base   Gait Pattern Step-through pattern;Decreased arm swing - left;Decreased step length - right;Decreased step length - left;Narrow base of support;Decreased arm swing - right    Ambulation Surface Level;Indoor    Gait velocity 14.5 seconds = 2.26 ft/sec      Self-Care   Self-Care --    Other Self-Care Comments  --      Therapeutic Activites    Other Therapeutic Activities Discussed progress in PT, results of LTGs. Reviewed frequency of performing HEP, getting scheduled for 2 additional PT visits when pt returns from vacation                Pt performs PWR! Moves in seated position - reviewed for HEP    PWR! Up for improved posture 2 x 10 reps  PWR! Rock for improved weightshifting 2 x 5 reps each side - cues to bring elbow down on thigh, as pt initially confused about picture of reaching down to the ground, cues for a wider BOS and performing with BIG hands when reaching   PWR! Step for improved step initiation x 5 reps B double step out/in - pt with incr difficulty performing esp with incr reps, performed additional 10 reps B (how pt has been performing at home) single step out and in - performed over 4" foam beam as visual cue for incr intensity of movement. Pt asking what she can use at home for visual target, therapist discussed possibly lining up a couple soup cans for pt to working on stepping over for incr step height   Verbal, visual Cues provided for technique and intensity.  Verbally reviewed seated VOR x1 exercise due to time  constraints, discussed continuing to perform a couple times a week - will have pt demo at next session.      PT Education - 03/22/20 1127    Education Details progress towards LTGs, reviewed seated PWR moves for HEP    Person(s) Educated Patient  Methods Explanation;Demonstration    Comprehension Verbalized understanding;Returned demonstration            PT Short Term Goals - 02/23/20 1405      PT SHORT TERM GOAL #1   Title Pt will be independent with HEP for improved transfers, strength, balance, and gait.  TARGET 02/12/2020 for all STGs    Time 5    Period Weeks    Status Achieved      PT SHORT TERM GOAL #2   Title Pt will improve 5x sit<>stand to less than or equal to 13 seconds (with UE support), for improved transfer efficiency and lower extremity strength.    Baseline 15.28 sec with UE support; 5/14:  13.06 sec with no UE support    Time 5    Period Weeks    Status Achieved      PT SHORT TERM GOAL #3   Title Pt will improve TUG score to less than or equal to 18 seconds for decreased fall risk.    Baseline 24.5 sec; 5/14 15.54 sec with cane    Time 5    Period Weeks    Status Achieved      PT SHORT TERM GOAL #4   Title Pt will improve DGI score to at least 16/24 for decreased fall risk.    Baseline 13/24    Time 5    Period Weeks    Status Achieved      PT SHORT TERM GOAL #5   Title Pt will report at least 50% improvement in car transfers, for improved overall functional mobility.    Baseline Pt reports at least 50% improvement in car transfers; husband notes her improvement.    Time 5    Period Weeks    Status Achieved      PT SHORT TERM GOAL #6   Title Pt will verbalize understanding of fall prevention in home environment, including tips to reducing freezing with gait.    Time 5    Period Weeks    Status Achieved             PT Long Term Goals - 03/22/20 0944      PT LONG TERM GOAL #1   Title Pt will be independent with progression of Parkinson's  specific exercise program to improve overall functional mobility.  TARGET 9 weeks: 03/25/2020 for all LTGs    Baseline reviewed remainder of HEP on 03/22/20    Time 9    Period Weeks    Status Achieved      PT LONG TERM GOAL #2   Title Pt will perform at least 4 of 5 reps of sit<>stand from 18" chairs or lower, with minimal to no UE support, no LOB, for improved transfer efficiency and safety.    Baseline 5 reps of sit <> stand with no UE support from lower height chair, no LOB.    Time 9    Period Weeks    Status Achieved      PT LONG TERM GOAL #3   Title Pt will improve TUG score to less than or equal to 13.5 seconds for decreased fall risk.    Baseline 12.22 seconds with SPC with tripod base    Time 9    Period Weeks    Status Achieved      PT LONG TERM GOAL #4   Title Pt will improve DGI score to at least 19/24 for decreased fall risk.    Baseline 13/24 at eval  Time 9    Period Weeks    Status New      PT LONG TERM GOAL #5   Title Pt will improve gait velocity to at least 2.3 ft/sec for improved gait efficiency and safety.    Baseline 1.88 ft/sec at eval, 14.5 seconds = 2.26 ft/sec    Time 9    Period Weeks    Status Achieved             Plan - 03/22/20 1138    Clinical Impression Statement Focus of today's skilled session was beginning to check LTGs. Pt has met 4 out of 5 LTGs. Finished reviewing HEP today with seated PWR! Moves, with initial demo cues for technique and cues for intensity. Pt improved her gait speed with SPC with tripod base to 2.26 ft/sec (previously 1.88 ft/sec), with pt taking consistently longer strides with improved heel strike B. Pt able to perform the TUG today in 12.22 seconds (2nd attempt), however pt did report mild dizziness after performing a turn that subsided quickly. Pt able to perform sit <> stands from a lower surface with no UE support and no verbal cues. Pt is making excellent progress with PT. Will assess remaining LTGs at next  session and will re-cert for 2 more additional visits when pt returns from vacation.    Personal Factors and Comorbidities Comorbidity 3+    Comorbidities PMH includes breast cancer, anxiety, depression, L foot fracture, L wrist fracture from fall in December; OCD, osteopenia    Examination-Activity Limitations Locomotion Level;Transfers;Stairs;Stand;Other   car transfers   Examination-Participation Restrictions Community Activity;Other   Community exercise and walking in neighborhood   Stability/Clinical Decision Making Evolving/Moderate complexity   PD dx in February 2021, not yet started on PD medications   Rehab Potential Good    PT Frequency 2x / week    PT Duration Other (comment)   9 weeks, including eval week   PT Treatment/Interventions ADLs/Self Care Home Management;DME Instruction;Neuromuscular re-education;Balance training;Therapeutic exercise;Therapeutic activities;Functional mobility training;Stair training;Gait training;Patient/family education;Vestibular;Canalith Repostioning    PT Next Visit Plan Review seated VOR x1, review fall prevention (as pt will be travelling), check last LTG (DGI) will need to renew at end of week, for planned 1-2 visits after return from her trip to check in on mobility, HEP    Consulted and Agree with Plan of Care Patient           Patient will benefit from skilled therapeutic intervention in order to improve the following deficits and impairments:  Abnormal gait, Difficulty walking, Impaired tone, Decreased balance, Decreased mobility, Decreased strength, Postural dysfunction, Dizziness  Visit Diagnosis: Unsteadiness on feet  Other abnormalities of gait and mobility  Other symptoms and signs involving the nervous system  Muscle weakness (generalized)     Problem List Patient Active Problem List   Diagnosis Date Noted  . Parkinson's disease ([place]) 12/30/2019  . Closed fracture of distal end of left radius 09/17/2019  . Closed fracture of  styloid process of ulna 09/17/2019  . Insomnia 10/07/2017  . Dehydration 10/07/2017  . Port-A-Cath in place 09/30/2017  . Genetic testing 06/27/2017  . Family history of breast cancer   . Malignant neoplasm of upper-inner quadrant of right breast in female, estrogen receptor positive ([place]) 06/26/2017    Arliss Journey, PT, DPT  03/22/2020, 11:39 AM  [facility] [address] [place], [place], [address] Phone: [phone]   Fax:  [phone]  Name: Gloria Ware MRN:  [id number] Date of Birth: [birth year]

## 2020-03-25 ENCOUNTER — Ambulatory Visit: Payer: Medicare Other | Admitting: Physical Therapy

## 2020-03-25 ENCOUNTER — Other Ambulatory Visit: Payer: Self-pay

## 2020-03-25 DIAGNOSIS — R2681 Unsteadiness on feet: Secondary | ICD-10-CM

## 2020-03-25 DIAGNOSIS — R2689 Other abnormalities of gait and mobility: Secondary | ICD-10-CM

## 2020-03-25 DIAGNOSIS — R29818 Other symptoms and signs involving the nervous system: Secondary | ICD-10-CM

## 2020-03-25 NOTE — Therapy (Signed)
[facility] [address] [place], [place], [address] Phone: [phone]   Fax:  [phone]  Physical Therapy Treatment  Patient Details  Name: Gloria Ware MRN: [id number] Date of Birth: [birth year] Referring Provider (Gloria Ware): Alonza Bogus, DO   Encounter Date: 03/25/2020   Gloria Ware End of Session - 03/25/20 1521    Visit Number 17    Number of Visits 21    Date for Gloria Ware Re-Evaluation 69/62/95   60 day recert, as Gloria Ware will be out of town for several weeks   Authorization Type Medicare    Gloria Ware Start Time 1114   late start due to medical event in clinic   Gloria Ware Stop Time 1153    Gloria Ware Time Calculation (min) 39 min    Activity Tolerance Patient tolerated treatment well    Behavior During Therapy Gloria Ware for tasks assessed/performed           Past Medical History:  Diagnosis Date  . Anxiety   . Breast CA ([place])   . Colon polyp 09/12/2005  . Depression   . Eczema    right forearm   . Genetic testing 06/27/2017   Multi-Cancer panel (83 genes) @ [facility] - No pathogenic mutations detected  . Hearing aid worn    B/L  . Hearing loss   . High cholesterol   . Injury     left foot metatarsal fracture sustined 08-03-17 , treated with boot; patient  reports  improvement as of today  . OCD (obsessive compulsive disorder)   . Osteopenia 05/2019   T score -2.0 FRAX 32% / 16%  . Personal history of chemotherapy   . Personal history of radiation therapy   . PONV (postoperative nausea and vomiting)    post op nausea; took a long time waking up  . Tinnitus     Past Surgical History:  Procedure Laterality Date  . BREAST SURGERY     Mastectomy-Right  . CARDIAC CATHETERIZATION     husband was having symtpoms and needed a stent; subsequently she states she started " to feel something"  but thinks it was " all in my head bc of my hbsand". subsequently saw same cardiologist as her husband who sent for treadmill stress that had inconcluisicve  results so she was sent for cardiac catherization which she states was " definitely negative "   . cataract surg    . COLONOSCOPY     with polypectomy  . DILATION AND CURETTAGE OF UTERUS    . IR CV LINE INJECTION  09/30/2017  . MASTECTOMY Right   . MASTECTOMY W/ SENTINEL NODE BIOPSY Right 08/06/2017   Procedure: RIGHT MASTECTOMY WITH SENTINEL LYMPH NODE BIOPSY;  Surgeon: [name], MD;  Location: [facility];  Service: General;  Laterality: Right;  . MOUTH SURGERY    . PORT-A-CATH REMOVAL Left 01/20/2018   Procedure: REMOVAL PORT-A-CATH;  Surgeon: [name], MD;  Location: [facility];  Service: General;  Laterality: Left;  . PORTACATH PLACEMENT N/A 09/12/2017   Procedure: INSERTION PORT-A-CATH;  Surgeon: [name], MD;  Location: [facility];  Service: General;  Laterality: N/A;  . RADIOACTIVE SEED GUIDED AXILLARY SENTINEL LYMPH NODE Right 08/06/2017   Procedure: SEED TARGETED AXILLARY LYMPH NODE EXCISION;  Surgeon: [name], MD;  Location: [facility];  Service: General;  Laterality: Right;    There were no vitals filed for this visit.   Subjective Assessment - 03/25/20 1116    Subjective Worked with Gloria Ware earlier  this week.  Did my whole routine yesterday morning and I plan to do them everyday in the mornings.    Pertinent History PMH includes breast cancer, anxiety, depression, L foot fracture, L wrist fracture from fall in December; OCD, osteopenia    Patient Stated Goals Gloria Ware's goal for therapy is to get onto and out of a chair without arms, getting in and out of the car, walking comfortably and turning.    Currently in Pain? No/denies    Pain Onset More than a month ago                             [facility] Adult Gloria Ware Treatment/Exercise - 03/25/20 0001      Ambulation/Gait   Ambulation/Gait Yes    Ambulation/Gait Assistance 5: Supervision    Ambulation/Gait Assistance Details Gait with cane most distances in therapy; no device with  DGI    Ambulation Distance (Feet) 230 Feet   200   Assistive device Straight cane   with tripod base   Gait Pattern Step-through pattern;Decreased arm swing - left;Narrow base of support;Decreased arm swing - right    Ambulation Surface Level;Indoor      Dynamic Gait Index   Level Surface Mild Impairment    Change in Gait Speed Normal    Gait with Horizontal Head Turns Mild Impairment    Gait with Vertical Head Turns Mild Impairment    Gait and Pivot Turn Normal    Step Over Obstacle Mild Impairment    Step Around Obstacles Normal    Steps Mild Impairment    Total Score 19      Self-Care   Self-Care Other Self-Care Comments    Other Self-Care Comments  Discussed Gloria Ware's upcoming trip (airport/airplane to [place] and staying with family at various locations)-reviewed fall prevention education in regards to Gloria Ware being in more unfamiliar environments.  Also discussed Gloria Ware's PD symptoms  and how stressors in these new environments may cause Gloria Ware's PD symptoms to increased.  Will update POC to have 1-2 additional visits upon return from trip.  Discussed transition to community fitness upon d/c from Gloria Ware, including [employer] cycling class, which Gloria Ware is very interested in.           Neuro Re-education Reviewed Gloria Ware's standing HEP:    Staggered Stance Forward Backward Weight Shift with Counter Support - 2 x daily - 5 x weekly - 1 sets - 10 reps Side to Side Weight Shift with Counter Support - 2 x daily - 5 x weekly - 1 sets - 10 reps Side Stepping with Counter Support - 1 x daily - 5 x weekly - 1 sets - 10 reps Alternating Step Forward with Support - 1 x daily - 5 x weekly - 1 sets - 10 reps   Gloria Ware return demo understanding of correct technique of HEP.  Discussed that she has identified morning time as a consistent time to perform HEP through the day, but she can use these exercises at other times for optimal movement patterns.       Gloria Ware Education - 03/25/20 1520    Education Details Reviewed fall  prevention,educated on cycling class for PD at [facility]) Educated Patient    Methods Explanation    Comprehension Verbalized understanding            Gloria Ware Short Term Goals - 02/23/20 1405      Gloria Ware SHORT TERM GOAL #1  Title Gloria Ware will be independent with HEP for improved transfers, strength, balance, and gait.  TARGET 02/12/2020 for all STGs    Time 5    Period Weeks    Status Achieved      Gloria Ware SHORT TERM GOAL #2   Title Gloria Ware will improve 5x sit<>stand to less than or equal to 13 seconds (with UE support), for improved transfer efficiency and lower extremity strength.    Baseline 15.28 sec with UE support; 5/14:  13.06 sec with no UE support    Time 5    Period Weeks    Status Achieved      Gloria Ware SHORT TERM GOAL #3   Title Gloria Ware will improve TUG score to less than or equal to 18 seconds for decreased fall risk.    Baseline 24.5 sec; 5/14 15.54 sec with cane    Time 5    Period Weeks    Status Achieved      Gloria Ware SHORT TERM GOAL #4   Title Gloria Ware will improve DGI score to at least 16/24 for decreased fall risk.    Baseline 13/24    Time 5    Period Weeks    Status Achieved      Gloria Ware SHORT TERM GOAL #5   Title Gloria Ware will report at least 50% improvement in car transfers, for improved overall functional mobility.    Baseline Gloria Ware reports at least 50% improvement in car transfers; husband notes her improvement.    Time 5    Period Weeks    Status Achieved      Gloria Ware SHORT TERM GOAL #6   Title Gloria Ware will verbalize understanding of fall prevention in home environment, including tips to reducing freezing with gait.    Time 5    Period Weeks    Status Achieved             Gloria Ware Long Term Goals - 03/25/20 1126      Gloria Ware LONG TERM GOAL #1   Title Gloria Ware will be independent with progression of Parkinson's specific exercise program to improve overall functional mobility.  TARGET 9 weeks: 03/25/2020 for all LTGs    Baseline reviewed remainder of HEP on 03/22/20    Time 9    Period Weeks    Status  Achieved      Gloria Ware LONG TERM GOAL #2   Title Gloria Ware will perform at least 4 of 5 reps of sit<>stand from 18" chairs or lower, with minimal to no UE support, no LOB, for improved transfer efficiency and safety.    Baseline 5 reps of sit <> stand with no UE support from lower height chair, no LOB.    Time 9    Period Weeks    Status Achieved      Gloria Ware LONG TERM GOAL #3   Title Gloria Ware will improve TUG score to less than or equal to 13.5 seconds for decreased fall risk.    Baseline 12.22 seconds with SPC with tripod base    Time 9    Period Weeks    Status Achieved      Gloria Ware LONG TERM GOAL #4   Title Gloria Ware will improve DGI score to at least 19/24 for decreased fall risk.    Baseline 13/24 at eval, 19/24 03/25/2020    Time 9    Period Weeks    Status Achieved      Gloria Ware LONG TERM GOAL #5   Title Gloria Ware will improve gait velocity to at least 2.3 ft/sec  for improved gait efficiency and safety.    Baseline 1.88 ft/sec at eval, 14.5 seconds = 2.26 ft/sec    Time 9    Period Weeks    Status Achieved            Updated goals upon Gloria Ware's return from out of town trip:   Gloria Ware Long Term Goals - 03/25/20 1528      Gloria Ware LONG TERM GOAL #1   Title Gloria Ware will be independent with progression of/updates to Parkinson's specific exercise program to improve overall functional mobility.  TARGET 2 weeks (04/29/2020-due to Gloria Ware being out of town)    Time 2    Period Weeks    Status Revised      Gloria Ware LONG TERM GOAL #2   Title Gloria Ware will verbalize plans for transition to community fitness options upon d/c from Gloria Ware    Time 2    Period Weeks    Status Revised                 Plan - 03/25/20 1522    Clinical Impression Statement Gloria Ware has met LTG for DGI score, with improve score to 19/24.  Gloria Ware remains at slight fall risk per DGI and will continue to benefit from use of cane for safety and balance on outdoor and long distance surfaces.  Gloria Ware will continue to benefit from several additional visits to address any upgrades to HEP and  transition to community fitness.    Personal Factors and Comorbidities Comorbidity 3+    Comorbidities PMH includes breast cancer, anxiety, depression, L foot fracture, L wrist fracture from fall in December; OCD, osteopenia    Examination-Activity Limitations Locomotion Level;Transfers;Stairs;Stand;Other   car transfers   Examination-Participation Restrictions Community Activity;Other   Community exercise and walking in neighborhood   Stability/Clinical Decision Making Evolving/Moderate complexity   PD dx in February 2021, not yet started on PD medications   Rehab Potential Good    Gloria Ware Frequency 2x / week    Gloria Ware Duration 2 weeks    Gloria Ware Treatment/Interventions ADLs/Self Care Home Management;DME Instruction;Neuromuscular re-education;Balance training;Therapeutic exercise;Therapeutic activities;Functional mobility training;Stair training;Gait training;Patient/family education;Vestibular;Canalith Repostioning    Gloria Ware Next Visit Plan See recert for 1-2 additional weeks of visits for upgrading HEP as needed and for transition to community fitness.    Consulted and Agree with Plan of Care Patient           Patient will benefit from skilled therapeutic intervention in order to improve the following deficits and impairments:  Abnormal gait, Difficulty walking, Impaired tone, Decreased balance, Decreased mobility, Decreased strength, Postural dysfunction, Dizziness  Visit Diagnosis: Other abnormalities of gait and mobility  Unsteadiness on feet  Other symptoms and signs involving the nervous system     Problem List Patient Active Problem List   Diagnosis Date Noted  . Parkinson's disease ([place]) 12/30/2019  . Closed fracture of distal end of left radius 09/17/2019  . Closed fracture of styloid process of ulna 09/17/2019  . Insomnia 10/07/2017  . Dehydration 10/07/2017  . Port-A-Cath in place 09/30/2017  . Genetic testing 06/27/2017  . Family history of breast cancer   . Malignant neoplasm of  upper-inner quadrant of right breast in female, estrogen receptor positive ([place]) 06/26/2017    Gloria Ware W. 03/25/2020, 3:26 PM  Gloria Butt., Gloria Ware   [facility] [address] [name], [place], [address] Phone: [phone]   Fax:  [phone]  Name: Gloria Ware MRN: [id number] Date of Birth: May 06, 1946

## 2020-04-19 ENCOUNTER — Ambulatory Visit: Payer: Medicare Other | Attending: Neurology | Admitting: Physical Therapy

## 2020-04-19 ENCOUNTER — Other Ambulatory Visit: Payer: Self-pay

## 2020-04-19 DIAGNOSIS — R2681 Unsteadiness on feet: Secondary | ICD-10-CM | POA: Insufficient documentation

## 2020-04-19 DIAGNOSIS — M6281 Muscle weakness (generalized): Secondary | ICD-10-CM | POA: Diagnosis not present

## 2020-04-19 DIAGNOSIS — R2689 Other abnormalities of gait and mobility: Secondary | ICD-10-CM | POA: Diagnosis not present

## 2020-04-19 DIAGNOSIS — R293 Abnormal posture: Secondary | ICD-10-CM | POA: Insufficient documentation

## 2020-04-19 DIAGNOSIS — R29818 Other symptoms and signs involving the nervous system: Secondary | ICD-10-CM

## 2020-04-19 NOTE — Patient Instructions (Signed)
Access Code: [email or web address] URL: [email or web address] Date: 04/19/2020 Prepared by: Janann August  Exercises Side to Side Weight Shift with Counter Support - 2 x daily - 5 x weekly - 2 sets - 10 reps Side Stepping with Counter Support - 1 x daily - 5 x weekly - 1 sets - 10 reps Alternating Step Forward with Support - 1 x daily - 5 x weekly - 1 sets - 10 reps Staggered Stance Forward Backward Weight Shift with Unilateral Counter Support - 1 x daily - 5 x weekly - 2 sets - 10 reps

## 2020-04-19 NOTE — Therapy (Signed)
[facility] [address] [place], [place], [address] Phone: [phone]   Fax:  [phone]  Physical Therapy Treatment  Patient Details  Name: Gloria Ware MRN: [id number] Date of Birth: 01-Nov-1945 Referring Provider (PT): Alonza Bogus, DO   Encounter Date: 04/19/2020   PT End of Session - 04/19/20 1655    Visit Number 18    Number of Visits 21    Date for PT Re-Evaluation 29/79/89   60 day recert, as pt will be out of town for several weeks   Authorization Type Medicare    PT Start Time 1021   pt arrived late   PT Stop Time 1100    PT Time Calculation (min) 39 min    Activity Tolerance Patient tolerated treatment well    Behavior During Therapy [facility] for tasks assessed/performed           Past Medical History:  Diagnosis Date  . Anxiety   . Breast CA ([place])   . Colon polyp 09/12/2005  . Depression   . Eczema    right forearm   . Genetic testing 06/27/2017   Multi-Cancer panel (83 genes) @ [facility] - No pathogenic mutations detected  . Hearing aid worn    B/L  . Hearing loss   . High cholesterol   . Injury     left foot metatarsal fracture sustined 08-03-17 , treated with boot; patient  reports  improvement as of today  . OCD (obsessive compulsive disorder)   . Osteopenia 05/2019   T score -2.0 FRAX 32% / 16%  . Personal history of chemotherapy   . Personal history of radiation therapy   . PONV (postoperative nausea and vomiting)    post op nausea; took a long time waking up  . Tinnitus     Past Surgical History:  Procedure Laterality Date  . BREAST SURGERY     Mastectomy-Right  . CARDIAC CATHETERIZATION     husband was having symtpoms and needed a stent; subsequently she states she started " to feel something"  but thinks it was " all in my head bc of my hbsand". subsequently saw same cardiologist as her husband who sent for treadmill stress that had inconcluisicve results so she was sent for  cardiac catherization which she states was " definitely negative "   . cataract surg    . COLONOSCOPY     with polypectomy  . DILATION AND CURETTAGE OF UTERUS    . IR CV LINE INJECTION  09/30/2017  . MASTECTOMY Right   . MASTECTOMY W/ SENTINEL NODE BIOPSY Right 08/06/2017   Procedure: RIGHT MASTECTOMY WITH SENTINEL LYMPH NODE BIOPSY;  Surgeon: [name], MD;  Location: [facility];  Service: General;  Laterality: Right;  . MOUTH SURGERY    . PORT-A-CATH REMOVAL Left 01/20/2018   Procedure: REMOVAL PORT-A-CATH;  Surgeon: [name], MD;  Location: [facility];  Service: General;  Laterality: Left;  . PORTACATH PLACEMENT N/A 09/12/2017   Procedure: INSERTION PORT-A-CATH;  Surgeon: [name], MD;  [facility];  Service: General;  Laterality: N/A;  . RADIOACTIVE SEED GUIDED AXILLARY SENTINEL LYMPH NODE Right 08/06/2017   Procedure: SEED TARGETED AXILLARY LYMPH NODE EXCISION;  Surgeon: [name], MD;  Location: [facility];  Service: General;  Laterality: Right;    There were no vitals filed for this visit.   Subjective Assessment - 04/19/20 1023    Subjective Trip went really well. Was wonderful to be with  family. States that her family has noticed she is no longer shuffling with her walking. Going to start the PD cycle class this Friday.    Pertinent History PMH includes breast cancer, anxiety, depression, L foot fracture, L wrist fracture from fall in December; OCD, osteopenia    Patient Stated Goals Pt's goal for therapy is to get onto and out of a chair without arms, getting in and out of the car, walking comfortably and turning.    Currently in Pain? No/denies    Pain Onset More than a month ago                           Access Code: [email or web address] URL: [email or web address] Date: 04/19/2020 Prepared by: Janann August  Reviewed and upgraded final HEP below:   Exercises Side to Side Weight Shift with Counter  Support - 2 x daily - 5 x weekly - 2 sets - 10 reps - with UE support, adding dynamic SLS by lifting leg for a couple of seconds, to begin with lateral weight shifting and then add dynamic SLS Side Stepping with Counter Support - 1 x daily - 5 x weekly - 1 sets - 10 reps - cues for weight shift and to perform lunge Alternating Step Forward with Support - 1 x daily - 5 x weekly - 1 sets - 10 reps - cues for incr step heigh  Staggered Stance Forward Backward Weight Shift with Unilateral Counter Support - 1 x daily - 5 x weekly - 2 sets - 10 reps - upgraded to single UE support on counter and adding in reciprocal arm swing      [facility] Adult PT Treatment/Exercise - 04/19/20 0001      Therapeutic Activites    Therapeutic Activities Other Therapeutic Activities    Other Therapeutic Activities discussed community fitness plan post d/c with pt - with pt to begin to attend PD cycle classes beginning at the end of this week. also provided patient a recent july 2021 handout of community PD resources for support groups and exercise classes. Also discussed with pt discharge at end of next session and importance of continuing to perform HEP for continued gains made in PT and for community fitness plan with pt verbalizing understanding. Discussed return PT eval in 6-9 months after D/C, with pt interested and also in agreement                   PT Education - 04/19/20 1654    Education Details see TA    Person(s) Educated Patient    Methods Explanation;Handout    Comprehension Verbalized understanding            PT Short Term Goals - 02/23/20 1405      PT SHORT TERM GOAL #1   Title Pt will be independent with HEP for improved transfers, strength, balance, and gait.  TARGET 02/12/2020 for all STGs    Time 5    Period Weeks    Status Achieved      PT SHORT TERM GOAL #2   Title Pt will improve 5x sit<>stand to less than or equal to 13 seconds (with UE support), for improved transfer efficiency and  lower extremity strength.    Baseline 15.28 sec with UE support; 5/14:  13.06 sec with no UE support    Time 5    Period Weeks    Status Achieved      PT SHORT TERM GOAL #3  Title Pt will improve TUG score to less than or equal to 18 seconds for decreased fall risk.    Baseline 24.5 sec; 5/14 15.54 sec with cane    Time 5    Period Weeks    Status Achieved      PT SHORT TERM GOAL #4   Title Pt will improve DGI score to at least 16/24 for decreased fall risk.    Baseline 13/24    Time 5    Period Weeks    Status Achieved      PT SHORT TERM GOAL #5   Title Pt will report at least 50% improvement in car transfers, for improved overall functional mobility.    Baseline Pt reports at least 50% improvement in car transfers; husband notes her improvement.    Time 5    Period Weeks    Status Achieved      PT SHORT TERM GOAL #6   Title Pt will verbalize understanding of fall prevention in home environment, including tips to reducing freezing with gait.    Time 5    Period Weeks    Status Achieved             PT Long Term Goals - 03/25/20 1528      PT LONG TERM GOAL #1   Title Pt will be independent with progression of/updates to Parkinson's specific exercise program to improve overall functional mobility.  TARGET 2 weeks (04/29/2020-due to pt being out of town)    Time 2    Period Weeks    Status Revised      PT LONG TERM GOAL #2   Title Pt will verbalize plans for transition to community fitness options upon d/c from PT    Time 2    Period Weeks    Status Revised                 Plan - 04/19/20 1702    Clinical Impression Statement Focus of today's skilled session was reviewing community fitness plan after discharge next visit (with pt going to attend first PD cycle class this Friday) and providing updated local community PD resources for support groups and exercise. Also reviewed standing HEP at counter and made revisions as appropriate with pt able to demo  correctly. Will further review HEP at next session and anticipate D/C.    Personal Factors and Comorbidities Comorbidity 3+    Comorbidities PMH includes breast cancer, anxiety, depression, L foot fracture, L wrist fracture from fall in December; OCD, osteopenia    Examination-Activity Limitations Locomotion Level;Transfers;Stairs;Stand;Other   car transfers   Examination-Participation Restrictions Community Activity;Other   Community exercise and walking in neighborhood   Stability/Clinical Decision Making Evolving/Moderate complexity   PD dx in February 2021, not yet started on PD medications   Rehab Potential Good    PT Frequency 2x / week    PT Duration 2 weeks    PT Treatment/Interventions ADLs/Self Care Home Management;DME Instruction;Neuromuscular re-education;Balance training;Therapeutic exercise;Therapeutic activities;Functional mobility training;Stair training;Gait training;Patient/family education;Vestibular;Canalith Repostioning    PT Next Visit Plan review remainder of HEP (i reviewed standing counter balance today), transition to community fitness, return eval for 6-9 months??    Consulted and Agree with Plan of Care Patient           Patient will benefit from skilled therapeutic intervention in order to improve the following deficits and impairments:  Abnormal gait, Difficulty walking, Impaired tone, Decreased balance, Decreased mobility, Decreased strength, Postural dysfunction, Dizziness  Visit  Diagnosis: Unsteadiness on feet  Other abnormalities of gait and mobility  Other symptoms and signs involving the nervous system  Muscle weakness (generalized)  Abnormal posture     Problem List Patient Active Problem List   Diagnosis Date Noted  . Parkinson's disease ([place]) 12/30/2019  . Closed fracture of distal end of left radius 09/17/2019  . Closed fracture of styloid process of ulna 09/17/2019  . Insomnia 10/07/2017  . Dehydration 10/07/2017  . Port-A-Cath in  place 09/30/2017  . Genetic testing 06/27/2017  . Family history of breast cancer   . 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

## 2020-04-20 DIAGNOSIS — F411 Generalized anxiety disorder: Secondary | ICD-10-CM | POA: Diagnosis not present

## 2020-04-22 ENCOUNTER — Ambulatory Visit: Payer: Medicare Other | Admitting: Physical Therapy

## 2020-04-22 ENCOUNTER — Other Ambulatory Visit: Payer: Self-pay

## 2020-04-22 DIAGNOSIS — R293 Abnormal posture: Secondary | ICD-10-CM | POA: Diagnosis not present

## 2020-04-22 DIAGNOSIS — R2689 Other abnormalities of gait and mobility: Secondary | ICD-10-CM

## 2020-04-22 DIAGNOSIS — R2681 Unsteadiness on feet: Secondary | ICD-10-CM

## 2020-04-22 DIAGNOSIS — M6281 Muscle weakness (generalized): Secondary | ICD-10-CM | POA: Diagnosis not present

## 2020-04-22 DIAGNOSIS — R29818 Other symptoms and signs involving the nervous system: Secondary | ICD-10-CM | POA: Diagnosis not present

## 2020-04-23 NOTE — Therapy (Signed)
[facility] [address] [place], [place], [address] Phone: [phone]   Fax:  [phone]  Physical Therapy Treatment/Discharge Summary  Patient Details  Name: Gloria Ware MRN: [id number] Date of Birth: 12-03-1945 Referring Provider (PT): Alonza Bogus, DO   Encounter Date: 04/22/2020   PT End of Session - 04/23/20 0730    Visit Number 19    Number of Visits 21    Date for PT Re-Evaluation 53/61/44   60 day recert, as pt will be out of town for several weeks   Authorization Type Medicare    PT Start Time 1106   pt arrived late   PT Stop Time 1136   d/c day, all goal assessed   PT Time Calculation (min) 30 min    Activity Tolerance Patient tolerated treatment well    Behavior During Therapy [facility] for tasks assessed/performed           Past Medical History:  Diagnosis Date  . Anxiety   . Breast CA ([place])   . Colon polyp 09/12/2005  . Depression   . Eczema    right forearm   . Genetic testing 06/27/2017   Multi-Cancer panel (83 genes) @ [facility] - No pathogenic mutations detected  . Hearing aid worn    B/L  . Hearing loss   . High cholesterol   . Injury     left foot metatarsal fracture sustined 08-03-17 , treated with boot; patient  reports  improvement as of today  . OCD (obsessive compulsive disorder)   . Osteopenia 05/2019   T score -2.0 FRAX 32% / 16%  . Personal history of chemotherapy   . Personal history of radiation therapy   . PONV (postoperative nausea and vomiting)    post op nausea; took a long time waking up  . Tinnitus     Past Surgical History:  Procedure Laterality Date  . BREAST SURGERY     Mastectomy-Right  . CARDIAC CATHETERIZATION     husband was having symtpoms and needed a stent; subsequently she states she started " to feel something"  but thinks it was " all in my head bc of my hbsand". subsequently saw same cardiologist as her husband who sent for treadmill stress that had  inconcluisicve results so she was sent for cardiac catherization which she states was " definitely negative "   . cataract surg    . COLONOSCOPY     with polypectomy  . DILATION AND CURETTAGE OF UTERUS    . IR CV LINE INJECTION  09/30/2017  . MASTECTOMY Right   . MASTECTOMY W/ SENTINEL NODE BIOPSY Right 08/06/2017   Procedure: RIGHT MASTECTOMY WITH SENTINEL LYMPH NODE BIOPSY;  Surgeon: [name], MD;  Location: [facility];  Service: General;  Laterality: Right;  . MOUTH SURGERY    . PORT-A-CATH REMOVAL Left 01/20/2018   Procedure: REMOVAL PORT-A-CATH;  Surgeon: [name], MD;  Location: [facility];  Service: General;  Laterality: Left;  . PORTACATH PLACEMENT N/A 09/12/2017   Procedure: INSERTION PORT-A-CATH;  Surgeon: [name], MD;  Location: [facility];  Service: General;  Laterality: N/A;  . RADIOACTIVE SEED GUIDED AXILLARY SENTINEL LYMPH NODE Right 08/06/2017   Procedure: SEED TARGETED AXILLARY LYMPH NODE EXCISION;  Surgeon: [name], MD;  Location: [facility];  Service: General;  Laterality: Right;    There were no vitals filed for this visit.   Subjective Assessment - 04/22/20 1108    Subjective My family  loved the way I showed them to get in and out of the car.    Pertinent History PMH includes breast cancer, anxiety, depression, L foot fracture, L wrist fracture from fall in December; OCD, osteopenia    Patient Stated Goals Pt's goal for therapy is to get onto and out of a chair without arms, getting in and out of the car, walking comfortably and turning.    Currently in Pain? No/denies    Pain Onset More than a month ago                             [facility] Adult PT Treatment/Exercise - 04/22/20 1123      Transfers   Transfers Sit to Stand;Stand to Sit    Sit to Stand 6: Modified independent (Device/Increase time);Without upper extremity assist;Other (comment);From chair/3-in-1    Five time sit to stand comments  9.50   no  UE support   Stand to Sit 6: Modified independent (Device/Increase time);Without upper extremity assist;To chair/3-in-1      Ambulation/Gait   Ambulation/Gait Yes    Ambulation/Gait Assistance 6: Modified independent (Device/Increase time)    Ambulation Distance (Feet) 250 Feet    Assistive device Straight cane    Gait Pattern Step-through pattern;Decreased arm swing - left;Narrow base of support;Decreased arm swing - right    Ambulation Surface Level;Indoor    Gait velocity 12.56 sec = 2.61 ft/sec      Timed Up and Go Test   TUG Normal TUG    Normal TUG (seconds) 14.5      High Level Balance   High Level Balance Comments Reviewed seated PWR! Moves from HEP, with pt performing correctly, just needs reminder cues for sequence of PWR! Step.  Performed each, 5 reps.      Self-Care   Self-Care Other Self-Care Comments    Other Self-Care Comments  Reviewed community Parkinson's information, and pt reports she will be attending PD cycle class starting later today.  Reviewed importance of continueing HEP.  Discussed POC and progress overall towards goals and plans for d/c.  Discussed return PT eval in 6-9 months and pt in agreement.          Additional Neuro Re-education Reviewed HEP addition:  Side to Side Weight Shift with Counter Support - 2 x daily - 5 x weekly - 2 sets - 10 reps - with UE support, adding dynamic SLS by lifting leg for a couple of seconds, to begin with lateral weight shifting and then add dynamic SLS and  Staggered Stance Forward Backward Weight Shift with Unilateral Counter Support - 1 x daily - 5 x weekly - 2 sets - 10 reps - upgraded to single UE support on counter and adding in reciprocal arm swing     Pt return demo understanding.  Minimal cues for side to side weightshift for soft knees/flexed in midline, for improved ease of weightshifting.     PT Education - 04/23/20 0730    Education Details See self-care    Person(s) Educated Patient    Methods  Explanation    Comprehension Verbalized understanding            PT Short Term Goals - 02/23/20 1405      PT SHORT TERM GOAL #1   Title Pt will be independent with HEP for improved transfers, strength, balance, and gait.  TARGET 02/12/2020 for all STGs    Time 5    Period  Weeks    Status Achieved      PT SHORT TERM GOAL #2   Title Pt will improve 5x sit<>stand to less than or equal to 13 seconds (with UE support), for improved transfer efficiency and lower extremity strength.    Baseline 15.28 sec with UE support; 5/14:  13.06 sec with no UE support    Time 5    Period Weeks    Status Achieved      PT SHORT TERM GOAL #3   Title Pt will improve TUG score to less than or equal to 18 seconds for decreased fall risk.    Baseline 24.5 sec; 5/14 15.54 sec with cane    Time 5    Period Weeks    Status Achieved      PT SHORT TERM GOAL #4   Title Pt will improve DGI score to at least 16/24 for decreased fall risk.    Baseline 13/24    Time 5    Period Weeks    Status Achieved      PT SHORT TERM GOAL #5   Title Pt will report at least 50% improvement in car transfers, for improved overall functional mobility.    Baseline Pt reports at least 50% improvement in car transfers; husband notes her improvement.    Time 5    Period Weeks    Status Achieved      PT SHORT TERM GOAL #6   Title Pt will verbalize understanding of fall prevention in home environment, including tips to reducing freezing with gait.    Time 5    Period Weeks    Status Achieved             PT Long Term Goals - 04/23/20 0731      PT LONG TERM GOAL #1   Title Pt will be independent with progression of/updates to Parkinson's specific exercise program to improve overall functional mobility.  TARGET 2 weeks (04/29/2020-due to pt being out of town)    Time 2    Period Weeks    Status Achieved      PT LONG TERM GOAL #2   Title Pt will verbalize plans for transition to community fitness options upon d/c from  PT    Time 2    Period Weeks    Status Achieved                 Plan - 04/23/20 0731    Clinical Impression Statement Pt has met 2 of 2 updated LTGs; she has make continuous progress with functional mobility measures and it is evident she is performing HEP consistently at home.  This is despite missing therapy for almost 1 months due to extended time with family out of town.  She subjectively reports much improvement in her walking and ability to get in and out of the car.  She demo understnaidng of updated HEP and verbalizes importance of continued community fitness.  She is appropriate for d/c today, with plan for return PT eval in 6-9 months due to progressive nature of disease process.    Personal Factors and Comorbidities Comorbidity 3+    Comorbidities PMH includes breast cancer, anxiety, depression, L foot fracture, L wrist fracture from fall in December; OCD, osteopenia    Examination-Activity Limitations Locomotion Level;Transfers;Stairs;Stand;Other   car transfers   Examination-Participation Restrictions Community Activity;Other   Community exercise and walking in neighborhood   Stability/Clinical Decision Making Evolving/Moderate complexity   PD dx in February 2021, not  yet started on PD medications   Rehab Potential Good    PT Frequency 2x / week    PT Duration 2 weeks    PT Treatment/Interventions ADLs/Self Care Home Management;DME Instruction;Neuromuscular re-education;Balance training;Therapeutic exercise;Therapeutic activities;Functional mobility training;Stair training;Gait training;Patient/family education;Vestibular;Canalith Repostioning    PT Next Visit Plan D/C with plans for return PT eval in 6-9 months    Consulted and Agree with Plan of Care Patient           Patient will benefit from skilled therapeutic intervention in order to improve the following deficits and impairments:  Abnormal gait, Difficulty walking, Impaired tone, Decreased balance, Decreased  mobility, Decreased strength, Postural dysfunction, Dizziness  Visit Diagnosis: Unsteadiness on feet  Other abnormalities of gait and mobility     Problem List Patient Active Problem List   Diagnosis Date Noted  . Parkinson's disease ([place]) 12/30/2019  . Closed fracture of distal end of left radius 09/17/2019  . Closed fracture of styloid process of ulna 09/17/2019  . Insomnia 10/07/2017  . Dehydration 10/07/2017  . Port-A-Cath in place 09/30/2017  . Genetic testing 06/27/2017  . Family history of breast cancer   . Malignant neoplasm of upper-inner quadrant of right breast in female, estrogen receptor positive ([place]) 06/26/2017    Sharlie Shreffler W. 04/23/2020, 7:34 AM Frazier Butt., PT  [facility] [address] [place], [place], [address] Phone: [phone]   Fax:  [phone]  Name: Gloria Ware MRN: [id number] Date of Birth: 13-Feb-1946   PHYSICAL THERAPY DISCHARGE SUMMARY  Visits from Start of Care: 19  Current functional level related to goals / functional outcomes:  PT Long Term Goals - 04/23/20 0731      PT LONG TERM GOAL #1   Title Pt will be independent with progression of/updates to Parkinson's specific exercise program to improve overall functional mobility.  TARGET 2 weeks (04/29/2020-due to pt being out of town)    Time 2    Period Weeks    Status Achieved      PT LONG TERM GOAL #2   Title Pt will verbalize plans for transition to community fitness options upon d/c from PT    Time 2    Period Weeks    Status Achieved             Remaining deficits: Balance, posture, gait (improving)   Education / Equipment: Pt educated in [employer], community fitness and PD resources, fall prevention education.  Plan: Patient agrees to discharge.  Patient goals were met. Patient is being discharged due to meeting the stated rehab goals.  ?????Recommend PT return eval in 6-9 months due to  progressive nature of disease process.         Mady Haagensen, PT 04/23/20 7:38 AM Phone: [phone] Fax: [phone]

## 2020-05-02 DIAGNOSIS — H26491 Other secondary cataract, right eye: Secondary | ICD-10-CM | POA: Diagnosis not present

## 2020-05-02 DIAGNOSIS — Z961 Presence of intraocular lens: Secondary | ICD-10-CM | POA: Diagnosis not present

## 2020-05-03 DIAGNOSIS — F411 Generalized anxiety disorder: Secondary | ICD-10-CM | POA: Diagnosis not present

## 2020-05-05 ENCOUNTER — Encounter: Payer: Self-pay | Admitting: Adult Health

## 2020-05-05 ENCOUNTER — Ambulatory Visit (INDEPENDENT_AMBULATORY_CARE_PROVIDER_SITE_OTHER): Payer: Medicare Other | Admitting: Adult Health

## 2020-05-05 ENCOUNTER — Other Ambulatory Visit: Payer: Self-pay

## 2020-05-05 DIAGNOSIS — F411 Generalized anxiety disorder: Secondary | ICD-10-CM

## 2020-05-05 MED ORDER — LORAZEPAM 0.5 MG PO TABS: 0.5000 mg | ORAL_TABLET | Freq: Every day | ORAL | 2 refills | Status: AC | PRN

## 2020-05-05 NOTE — Progress Notes (Signed)
Gloria Ware [id number] [birth year] 74 y.o.  Subjective:   Patient ID:  Gloria Ware is a 74 y.o. (DOB [birth year]) female.  Chief Complaint: No chief complaint on file.   HPI [name] presents to the office today for follow-up of anxiety.   Describes mood today as "ok".  Mood symptoms - sometimes feels "depressed" - coming out of it now". Gets anxious at times. Denies irritability. Stating "I'm doing alright". Feels like the Ativan is helpful. Has taken it during the day and at bedtime with positive results. Anxiety "fueled" by Parkinson's symptoms. Has benworking with PT. Is now taking a cycling class. Taking a moderate yoga class.Stating "I'm doing everything I can to help myself". Also stating my biggest fear is it getting worse". Working with a Transport planner biweekly. Diagnosed with Parkinson's - taking Carbidopa-Levodpa. Stable interest and motivation. Taking medications as prescribed.  Energy levels "better some days than others". Active, has a regular exercise routine.  Enjoys some usual interests and activities. Married. Lives with husband of 10 years. Has 2 grown sons - 74 in [place] and 1 in [place]. Recent trip to [place] to visit son. Talking to friends. Appetite adequate. Weight stable - 132 pounds. Sleeps well most nights. Averages 8 hours. Naps during the day - 1 to 2 hours.  Focus and concentration stable. Reading.  In a book club. 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 Completing tasks. Managing some aspects of household.  Denies SI or HI. Denies AH or VH.  Previous medication trials: Zoloft  Visit Diagnosis: No diagnosis found.  Past Psychiatric History: Denies psychiatric hospitalization.   PHQ2-9     Follow Up  from 02/10/2018 in [facility] from 11/07/2017 in [facility]  PHQ-2 Total Score 0 2  PHQ-9 Total Score 0 7       Review of Systems:  Review of Systems   Musculoskeletal: Negative for gait problem.  Neurological: Negative for tremors.  Psychiatric/Behavioral:       Please refer to HPI    Medications: I have reviewed the patient's current medications.  Current Outpatient Medications  Medication Sig Dispense Refill   acetaminophen (TYLENOL) 500 MG tablet Take 500 mg by mouth daily as needed for moderate pain or headache.     anastrozole (ARIMIDEX) 1 MG tablet TAKE 1 TABLET BY MOUTH DAILY 90 tablet 0   Calcium Carbonate-Vitamin D (CALTRATE 600+D PO) Take 1 tablet by mouth 2 (two) times daily.     carbidopa-levodopa (SINEMET IR) 25-100 MG tablet Take 1 tablet by mouth 3 (three) times daily. 8am/noon/4pm 270 tablet 1   docusate sodium (COLACE) 100 MG capsule Take 100 mg by mouth at bedtime.     feeding supplement (BOOST HIGH PROTEIN) LIQD Take by mouth every morning.     LORazepam (ATIVAN) 0.5 MG tablet Take 1 tablet (0.5 mg total) by mouth daily as needed for anxiety. 30 tablet 2   MELATONIN PO Take 1 tablet by mouth at bedtime as needed.      propranolol (INDERAL) 10 MG tablet TAKE 1 TABLET BY MOUTH TWICE A DAY 180 tablet 0   sertraline (ZOLOFT) 100 MG tablet Take 200 mg by mouth daily.      No current facility-administered medications for this visit.    Medication Side Effects: None  Allergies:  Allergies  Allergen Reactions   Penicillins Other (See Comments)    Throat burning Has patient had a PCN reaction  causing immediate rash, facial/tongue/throat swelling, SOB or lightheadedness with hypotension: No Has patient had a PCN reaction causing severe rash involving mucus membranes or skin necrosis: No Has patient had a PCN reaction that required hospitalization: No Has patient had a PCN reaction occurring within the last 10 years: No If all of the above answers are "NO", then may proceed with Cephalosporin use.    Sulfa Antibiotics Nausea And Vomiting   Atorvastatin      Muscle aches    Zetia [Ezetimibe] Other (See  Comments)    Muscle aches     Past Medical History:  Diagnosis Date   Anxiety    Breast CA ([place])    Colon polyp 09/12/2005   Depression    Eczema    right forearm    Genetic testing 06/27/2017   Multi-Cancer panel (83 genes) @ [facility] - No pathogenic mutations detected   Hearing aid worn    B/L   Hearing loss    High cholesterol    Injury     left foot metatarsal fracture sustined 08-03-17 , treated with boot; patient  reports  improvement as of today   OCD (obsessive compulsive disorder)    Osteopenia 05/2019   T score -2.0 FRAX 32% / 16%   Personal history of chemotherapy    Personal history of radiation therapy    PONV (postoperative nausea and vomiting)    post op nausea; took a long time waking up   Tinnitus     Family History  Problem Relation Age of Onset   Breast cancer Sister 63       currently 64   Hypertension Maternal Grandmother    Stroke Maternal Grandmother    Hypertension Maternal Grandfather    Stroke Maternal Grandfather    Breast cancer Cousin        unconfirmed in maternal first cousin   Ovarian cancer Cousin        unconfirmed if ovarian vs. uterine in paternal first cousin    Social History   Socioeconomic History   Marital status: Married    Spouse name: Not on file   Number of children: Not on file   Years of education: Not on file   Highest education level: Not on file  Occupational History   Occupation: retired    Comment: musician  Tobacco Use   Smoking status: Never Smoker   Smokeless tobacco: Never Used  Scientific laboratory technician Use: Never used  Substance and Sexual Activity   Alcohol use: Not Currently    Alcohol/week: 0.0 standard drinks    Comment: Rare wine   Drug use: No   Sexual activity: Not Currently    Partners: Male    Birth control/protection: Post-menopausal    Comment: 1st intercourse 59 yo-1 partner  Other Topics Concern   Not on file  Social History Narrative   Right  handed   Lives in 1 story home   Lives with spouse   Social Determinants of Health   Financial Resource Strain:    Difficulty of Paying Living Expenses:   Food Insecurity:    Worried About Charity fundraiser in the Last Year:    Arboriculturist in the Last Year:   Transportation Needs:    Film/video editor (Medical):    Lack of Transportation (Non-Medical):   Physical Activity:    Days of Exercise per Week:    Minutes of Exercise per Session:   Stress:    Feeling  of Stress :   Social Connections:    Frequency of Communication with Friends and Family:    Frequency of Social Gatherings with Friends and Family:    Attends Religious Services:    Active Member of Clubs or Organizations:    Attends Music therapist:    Marital Status:   Intimate Partner Violence:    Fear of Current or Ex-Partner:    Emotionally Abused:    Physically Abused:    Sexually Abused:     Past Medical History, Surgical history, Social history, and Family history were reviewed and updated as appropriate.   Please see review of systems for further details on the patient's review from today.   Objective:   Physical Exam:  LMP 07/30/1998   Physical Exam Constitutional:      General: She is not in acute distress. Musculoskeletal:        General: No deformity.  Neurological:     Mental Status: She is alert and oriented to person, place, and time.     Coordination: Coordination normal.  Psychiatric:        Attention and Perception: Attention and perception normal. She does not perceive auditory or visual hallucinations.        Mood and Affect: Mood normal. Mood is not anxious or depressed. Affect is not labile, blunt, angry or inappropriate.        Speech: Speech normal.        Behavior: Behavior normal.        Thought Content: Thought content normal. Thought content is not paranoid or delusional. Thought content does not include homicidal or suicidal ideation.  Thought content does not include homicidal or suicidal plan.        Cognition and Memory: Cognition and memory normal.        Judgment: Judgment normal.     Comments: Insight intact     Lab Review:     Component Value Date/Time   NA 137 12/01/2019 0943   NA 138 09/30/2017 1242   K 4.2 12/01/2019 0943   K 4.0 09/30/2017 1242   CL 101 12/01/2019 0943   CO2 28 12/01/2019 0943   CO2 23 09/30/2017 1242   GLUCOSE 94 12/01/2019 0943   GLUCOSE 120 09/30/2017 1242   BUN 21 12/01/2019 0943   BUN 14.4 09/30/2017 1242   CREATININE 0.86 12/01/2019 0943   CREATININE 1.0 09/30/2017 1242   CALCIUM 10.1 12/01/2019 0943   CALCIUM 8.9 09/30/2017 1242   PROT 7.2 12/01/2019 0943   PROT 6.9 09/30/2017 1242   ALBUMIN 3.8 01/14/2018 1430   ALBUMIN 3.7 09/30/2017 1242   AST 21 12/01/2019 0943   AST 30 01/14/2018 1430   AST 12 09/30/2017 1242   ALT 24 12/01/2019 0943   ALT 65 (H) 01/14/2018 1430   ALT 13 09/30/2017 1242   ALKPHOS 181 (H) 01/14/2018 1430   ALKPHOS 136 09/30/2017 1242   BILITOT 0.5 12/01/2019 0943   BILITOT 0.2 01/14/2018 1430   BILITOT <0.22 09/30/2017 1242   GFRNONAA >60 01/14/2018 1430   GFRAA >60 01/14/2018 1430       Component Value Date/Time   WBC 4.2 01/14/2018 1430   WBC 4.3 11/05/2017 0636   RBC 3.95 01/14/2018 1430   HGB 12.2 01/14/2018 1430   HGB 11.3 (L) 09/30/2017 1242   HCT 36.3 01/14/2018 1430   HCT 34.3 (L) 09/30/2017 1242   PLT 161 01/14/2018 1430   PLT 183 09/30/2017 1242   MCV 91.9 01/14/2018 1430  MCV 87.6 09/30/2017 1242   MCH 30.9 01/14/2018 1430   MCHC 33.6 01/14/2018 1430   RDW 13.0 01/14/2018 1430   RDW 13.7 09/30/2017 1242   LYMPHSABS 0.7 (L) 01/14/2018 1430   LYMPHSABS 1.8 09/30/2017 1242   MONOABS 0.4 01/14/2018 1430   MONOABS 0.7 09/30/2017 1242   EOSABS 0.4 01/14/2018 1430   EOSABS 0.1 09/30/2017 1242   BASOSABS 0.0 01/14/2018 1430   BASOSABS 0.1 09/30/2017 1242    No results found for: POCLITH, LITHIUM   No results found  for: PHENYTOIN, PHENOBARB, VALPROATE, CBMZ   .res Assessment: Plan:    Plan:  PDMP reviewed  1. Zoloft 200mg  daily - PCP prescribing 2. Ativan 0.5mg  daily as needed for anxiety  RTC 6 months - will call in 3 months for a refill.  Patient advised to contact office with any questions, adverse effects, or acute worsening in signs and symptoms.  Discussed potential benefits, risk, and side effects of benzodiazepines to include potential risk of tolerance and dependence, as well as possible drowsiness.  Advised patient not to drive if experiencing drowsiness and to take lowest possible effective dose to minimize risk of dependence and tolerance.    Diagnoses and all orders for this visit:  Generalized anxiety disorder -     LORazepam (ATIVAN) 0.5 MG tablet; Take 1 tablet (0.5 mg total) by mouth daily as needed for anxiety.     Please see After Visit Summary for patient specific instructions.  Future Appointments  Date Time Provider [place]  06/09/2020  2:30 PM Joseph Pierini, MD [facility]  06/10/2020  1:00 PM Tat, Eustace Quail, DO [facility] None  06/20/2020 12:30 PM [facility] [name] 1 [facility]  01/19/2021 11:15 AM Nicholas Lose, MD [facility] None    No orders of the defined types were placed in this encounter.   -------------------------------

## 2020-05-17 DIAGNOSIS — F411 Generalized anxiety disorder: Secondary | ICD-10-CM | POA: Diagnosis not present

## 2020-05-24 DIAGNOSIS — H26491 Other secondary cataract, right eye: Secondary | ICD-10-CM | POA: Diagnosis not present

## 2020-05-30 DIAGNOSIS — Z23 Encounter for immunization: Secondary | ICD-10-CM | POA: Diagnosis not present

## 2020-06-01 DIAGNOSIS — F411 Generalized anxiety disorder: Secondary | ICD-10-CM | POA: Diagnosis not present

## 2020-06-09 ENCOUNTER — Other Ambulatory Visit: Payer: Self-pay

## 2020-06-09 ENCOUNTER — Encounter: Payer: Self-pay | Admitting: Obstetrics and Gynecology

## 2020-06-09 ENCOUNTER — Ambulatory Visit (INDEPENDENT_AMBULATORY_CARE_PROVIDER_SITE_OTHER): Payer: Medicare Other | Admitting: Obstetrics and Gynecology

## 2020-06-09 VITALS — BP 116/74 | Ht 60.0 in | Wt 134.0 lb

## 2020-06-09 DIAGNOSIS — M8580 Other specified disorders of bone density and structure, unspecified site: Secondary | ICD-10-CM

## 2020-06-09 DIAGNOSIS — Z01411 Encounter for gynecological examination (general) (routine) with abnormal findings: Secondary | ICD-10-CM

## 2020-06-09 DIAGNOSIS — Z853 Personal history of malignant neoplasm of breast: Secondary | ICD-10-CM

## 2020-06-09 DIAGNOSIS — Z9289 Personal history of other medical treatment: Secondary | ICD-10-CM | POA: Diagnosis not present

## 2020-06-09 NOTE — Progress Notes (Signed)
SHAINE MOUNT [birth year] [id number]  SUBJECTIVE:  74 y.o. G2P2 female here for a breast and pelvic exam. She has no gynecologic concerns.  Current Outpatient Medications  Medication Sig Dispense Refill  . acetaminophen (TYLENOL) 500 MG tablet Take 500 mg by mouth daily as needed for moderate pain or headache.    . anastrozole (ARIMIDEX) 1 MG tablet TAKE 1 TABLET BY MOUTH DAILY 90 tablet 0  . Calcium Carbonate-Vitamin D (CALTRATE 600+D PO) Take 1 tablet by mouth 2 (two) times daily.    . carbidopa-levodopa (SINEMET IR) 25-100 MG tablet Take 1 tablet by mouth 3 (three) times daily. 8am/noon/4pm 270 tablet 1  . docusate sodium (COLACE) 100 MG capsule Take 100 mg by mouth at bedtime.    . feeding supplement (BOOST HIGH PROTEIN) LIQD Take by mouth every morning.    Marland Kitchen LORazepam (ATIVAN) 0.5 MG tablet Take 1 tablet (0.5 mg total) by mouth daily as needed for anxiety. 30 tablet 2  . sertraline (ZOLOFT) 100 MG tablet Take 200 mg by mouth daily.     Marland Kitchen MELATONIN PO Take 1 tablet by mouth at bedtime as needed.  (Patient not taking: Reported on 06/09/2020)    . propranolol (INDERAL) 10 MG tablet TAKE 1 TABLET BY MOUTH TWICE A DAY (Patient not taking: Reported on 06/09/2020) 180 tablet 0   No current facility-administered medications for this visit.   Allergies: Penicillins, Sulfa antibiotics, Atorvastatin, and Zetia [ezetimibe]  Patient's last menstrual period was 07/30/1998.  Past medical history,surgical history, problem list, medications, allergies, family history and social history were all reviewed and documented as reviewed in the EPIC chart.  GYN ROS: no abnormal bleeding, pelvic pain or discharge, no breast pain or new or enlarging lumps on self exam.  No dysuria, urinary frequency, pain with urination, cloudy/malodorous urine.   OBJECTIVE:  BP 116/74   Ht 5' (1.524 m)   Wt 134 lb (60.8 kg)   LMP 07/30/1998   BMI 26.17 kg/m  The patient appears well, alert, oriented, in no  distress.  BREAST EXAM: Left breast appears normal, no suspicious masses, no skin or nipple changes or axillary nodes, Right breast is status post mastectomy, tiny firm nodule the size of a BB pellet still present in the mid chest wall and the scar line from the prior surgery which is nontender and mobile.    PELVIC EXAM: VULVA: normal appearing vulva with no masses, tenderness or lesions, VAGINA: normal appearing vagina with normal color and discharge, no lesions, CERVIX: normal appearing cervix without discharge or lesions, UTERUS: uterus is normal size, shape, consistency and nontender, ADNEXA: normal adnexa in size, nontender and no masses  Chaperone: Aurora Mask (DNP student) present during the examination and performed the pelvic exam with me in attendance to confirm the exam findings   ASSESSMENT:  74 y.o. G2P2 here for a breast and pelvic exam  PLAN:   1. Postmenopausal.  No significant hot flashes or night sweats.  No vaginal bleeding. 2. Pap smear 2017.  No Pap smear today.  No history of abnormal Pap smears.  She is comfortable with not screening following the current guidelines based on age criteria. 3.  Breast cancer with prior right mastectomy.  She has a small nodule in the anterior chest that is stable from the previous exams per documentation.  Following with oncology, she is on anastrozole.  Mammogram scheduled 06/20/2020.  Normal breast exam today/NED.  We will continue to follow-up with her oncologist. 4. Colonoscopy 2013.  She  will follow up at the interval recommended by her GI specialist.   5. Osteopenia.  DEXA 05/2019. T score -2.0 FRAX 32% / 16%.  Treated with Boniva for 5 years and off 2 years.  She was on Actonel 2014 2016 that discontinued.  Overall DEXA has appeared stable according to Dr. Loetta Rough note 05/22/2019.  Repeat DEXA recommended 2022, will address at next annual visit. 6. Health maintenance.  No labs today as she normally has these completed elsewhere.  Return  annually or sooner, prn.   Joseph Pierini MD 06/09/20

## 2020-06-09 NOTE — Progress Notes (Signed)
Assessment/Plan:   1.  Parkinsons Disease  -Continue carbidopa/levodopa 25/100, 1 tablet 3 times per day  -exercise  -We discussed that it used to be thought that levodopa would increase risk of melanoma but now it is believed that Parkinsons itself likely increases risk of melanoma. she is to get regular skin checks.  -information given to RWW  -pt and husband with many questions and I answered those to the best of my ability today  2.  GAD  -Have discussed counseling with the patient and is now following with behavioral health with NP with Dr. Clovis Pu  -On sertraline, 200 mg daily  -On Ativan, 0.5 mg daily  3.  History of breast cancer  -Follows with oncology.  -Status post mastectomy  4.  Neurogenic Orthostatic Hypotension  -Declined abdominal compression binder  -Better with increased water intake.  We will monitor Subjective:   Gloria Ware was seen today in follow up for Parkinsons disease.  My previous records were reviewed prior to todays visit as well as outside records available to me.  We started her on levodopa last visit and weaned her off of propranolol, primarily because we diagnosed her with Parkinson's disease and not just primary tremor and because her blood pressure was already low.  Pt denies falls.  I got a call from the neuro rehab unit with orthostatics, and they were positive.  I recommended an abdominal compression binder, which the patient declined.  She stated that she really was not dizzy.  She was agreeable to increasing her water intake, stating that she really was not drinking much water.  Today, she reports that she is drinking lots of water now as she finds that it helps her tolerate the levodopa.  She sometimes awakens in the AM and doesn't feel well but better once she takes meds.  No longer having low BP at home.  Husband states that in general, she is feeling so much better physically and mentally.  Husband reports that she is moving better.   no  hallucinations.  Mood has been good.  Has followed with physical therapy and those notes have been reviewed.  Saw NP from Dr. Clovis Pu and takes ativan maybe one day per week.    Current prescribed movement disorder medications: Carbidopa/levodopa 25/100, 1 tablet 3 times per day (started last visit) - 8am/noon/4pm   PREVIOUS MEDICATIONS: Propranolol, 10 mg twice daily (low blood pressure)  ALLERGIES:   Allergies  Allergen Reactions  . Penicillins Other (See Comments)    Throat burning Has patient had a PCN reaction causing immediate rash, facial/tongue/throat swelling, SOB or lightheadedness with hypotension: No Has patient had a PCN reaction causing severe rash involving mucus membranes or skin necrosis: No Has patient had a PCN reaction that required hospitalization: No Has patient had a PCN reaction occurring within the last 10 years: No If all of the above answers are "NO", then may proceed with Cephalosporin use.   . Sulfa Antibiotics Nausea And Vomiting  . Atorvastatin      Muscle aches   . Zetia [Ezetimibe] Other (See Comments)    Muscle aches     CURRENT MEDICATIONS:  Outpatient Encounter Medications as of 06/10/2020  Medication Sig  . acetaminophen (TYLENOL) 500 MG tablet Take 500 mg by mouth daily as needed for moderate pain or headache.  . anastrozole (ARIMIDEX) 1 MG tablet TAKE 1 TABLET BY MOUTH DAILY  . Calcium Carbonate-Vitamin D (CALTRATE 600+D PO) Take 1 tablet by mouth 2 (two)  times daily.  . carbidopa-levodopa (SINEMET IR) 25-100 MG tablet Take 1 tablet by mouth 3 (three) times daily. 8am/noon/4pm  . docusate sodium (COLACE) 100 MG capsule Take 100 mg by mouth at bedtime.  . feeding supplement (BOOST HIGH PROTEIN) LIQD Take by mouth every morning.  Marland Kitchen LORazepam (ATIVAN) 0.5 MG tablet Take 1 tablet (0.5 mg total) by mouth daily as needed for anxiety.  Marland Kitchen MELATONIN PO Take 1 tablet by mouth at bedtime as needed.   . sertraline (ZOLOFT) 100 MG tablet Take 200 mg by  mouth daily.   . [DISCONTINUED] propranolol (INDERAL) 10 MG tablet TAKE 1 TABLET BY MOUTH TWICE A DAY (Patient not taking: Reported on 06/10/2020)   No facility-administered encounter medications on file as of 06/10/2020.    Objective:   PHYSICAL EXAMINATION:    VITALS:   Vitals:   06/10/20 1304  BP: 114/72  Pulse: 93  SpO2: 96%  Weight: 136 lb (61.7 kg)  Height: 5\' 1"  (1.549 m)    GEN:  The patient appears stated age and is in NAD. HEENT:  Normocephalic, atraumatic.  The mucous membranes are moist. The superficial temporal arteries are without ropiness or tenderness. CV:  RRR Lungs:  CTAB Neck/HEME:  There are no carotid bruits bilaterally.  Neurological examination:  Orientation: The patient is alert and oriented x3. Cranial nerves: There is good facial symmetry with min facial hypomimia. The speech is fluent and clear. Soft palate rises symmetrically and there is no tongue deviation. Hearing is intact to conversational tone. Sensation: Sensation is intact to light touch throughout Motor: Strength is at least antigravity x4.  Movement examination: Tone: There is mild increased tone in the LUE Abnormal movements: mild dyskinesia of the L foot;  Coordination:  There is mild decremation with RAM's, with any form of RAMS, including alternating supination and pronation of the forearm, hand opening and closing, finger taps, heel taps and toe taps Gait and Station: The patient has no difficulty arising out of a deep-seated chair without the use of the hands. The patient's stride length is good.    I have reviewed and interpreted the following labs independently    Chemistry      Component Value Date/Time   NA 137 12/01/2019 0943   NA 138 09/30/2017 1242   K 4.2 12/01/2019 0943   K 4.0 09/30/2017 1242   CL 101 12/01/2019 0943   CO2 28 12/01/2019 0943   CO2 23 09/30/2017 1242   BUN 21 12/01/2019 0943   BUN 14.4 09/30/2017 1242   CREATININE 0.86 12/01/2019 0943   CREATININE  1.0 09/30/2017 1242      Component Value Date/Time   CALCIUM 10.1 12/01/2019 0943   CALCIUM 8.9 09/30/2017 1242   ALKPHOS 181 (H) 01/14/2018 1430   ALKPHOS 136 09/30/2017 1242   AST 21 12/01/2019 0943   AST 30 01/14/2018 1430   AST 12 09/30/2017 1242   ALT 24 12/01/2019 0943   ALT 65 (H) 01/14/2018 1430   ALT 13 09/30/2017 1242   BILITOT 0.5 12/01/2019 0943   BILITOT 0.2 01/14/2018 1430   BILITOT <0.22 09/30/2017 1242       Lab Results  Component Value Date   WBC 4.2 01/14/2018   HGB 12.2 01/14/2018   HCT 36.3 01/14/2018   MCV 91.9 01/14/2018   PLT 161 01/14/2018    Lab Results  Component Value Date   TSH 2.54 12/01/2019     Total time spent on today's visit was 40 minutes, including  both face-to-face time and nonface-to-face time.  Time included that spent on review of records (prior notes available to me/labs/imaging if pertinent), discussing treatment and goals, answering patient's questions and coordinating care.  Cc:  [name], MD

## 2020-06-10 ENCOUNTER — Ambulatory Visit (INDEPENDENT_AMBULATORY_CARE_PROVIDER_SITE_OTHER): Payer: Medicare Other | Admitting: Neurology

## 2020-06-10 ENCOUNTER — Encounter: Payer: Self-pay | Admitting: Neurology

## 2020-06-10 VITALS — BP 114/72 | HR 93 | Ht 61.0 in | Wt 136.0 lb

## 2020-06-10 DIAGNOSIS — G249 Dystonia, unspecified: Secondary | ICD-10-CM | POA: Diagnosis not present

## 2020-06-10 DIAGNOSIS — G20 Parkinson's disease: Secondary | ICD-10-CM | POA: Diagnosis not present

## 2020-06-10 NOTE — Patient Instructions (Addendum)
No changes in your medication today.  You look great today!  The physicians and staff at [facility] are committed to providing excellent care. You may receive a survey requesting feedback about your experience at our office. We strive to receive "very good" responses to the survey questions. If you feel that your experience would prevent you from giving the office a "very good " response, please contact our office to try to remedy the situation. We may be reached at [phone]. Thank you for taking the time out of your busy day to complete the survey.

## 2020-06-16 ENCOUNTER — Other Ambulatory Visit: Payer: Self-pay | Admitting: Hematology and Oncology

## 2020-06-20 ENCOUNTER — Ambulatory Visit
Admission: RE | Admit: 2020-06-20 | Discharge: 2020-06-20 | Disposition: A | Discharge status: Home / Self Care | Payer: Medicare Other | Source: Ambulatory Visit | Attending: General Surgery | Admitting: General Surgery

## 2020-06-20 ENCOUNTER — Other Ambulatory Visit: Payer: Self-pay

## 2020-06-20 DIAGNOSIS — N6310 Unspecified lump in the right breast, unspecified quadrant: Secondary | ICD-10-CM

## 2020-06-20 DIAGNOSIS — N641 Fat necrosis of breast: Secondary | ICD-10-CM | POA: Diagnosis not present

## 2020-06-21 ENCOUNTER — Other Ambulatory Visit: Payer: Self-pay | Admitting: Obstetrics and Gynecology

## 2020-06-21 DIAGNOSIS — Z1231 Encounter for screening mammogram for malignant neoplasm of breast: Secondary | ICD-10-CM

## 2020-07-04 ENCOUNTER — Other Ambulatory Visit: Payer: Self-pay | Admitting: Neurology

## 2020-07-05 DIAGNOSIS — Z23 Encounter for immunization: Secondary | ICD-10-CM | POA: Diagnosis not present

## 2020-07-28 ENCOUNTER — Ambulatory Visit
Admission: RE | Admit: 2020-07-28 | Discharge: 2020-07-28 | Disposition: A | Discharge status: Home / Self Care | Payer: Medicare Other | Source: Ambulatory Visit | Attending: Obstetrics and Gynecology | Admitting: Obstetrics and Gynecology

## 2020-07-28 ENCOUNTER — Other Ambulatory Visit: Payer: Self-pay

## 2020-07-28 DIAGNOSIS — Z1231 Encounter for screening mammogram for malignant neoplasm of breast: Secondary | ICD-10-CM

## 2020-08-05 DIAGNOSIS — E7800 Pure hypercholesterolemia, unspecified: Secondary | ICD-10-CM | POA: Diagnosis not present

## 2020-08-05 DIAGNOSIS — C50919 Malignant neoplasm of unspecified site of unspecified female breast: Secondary | ICD-10-CM | POA: Diagnosis not present

## 2020-08-05 DIAGNOSIS — F419 Anxiety disorder, unspecified: Secondary | ICD-10-CM | POA: Diagnosis not present

## 2020-08-05 DIAGNOSIS — C773 Secondary and unspecified malignant neoplasm of axilla and upper limb lymph nodes: Secondary | ICD-10-CM | POA: Diagnosis not present

## 2020-08-05 DIAGNOSIS — F324 Major depressive disorder, single episode, in partial remission: Secondary | ICD-10-CM | POA: Diagnosis not present

## 2020-08-05 DIAGNOSIS — M8588 Other specified disorders of bone density and structure, other site: Secondary | ICD-10-CM | POA: Diagnosis not present

## 2020-08-05 DIAGNOSIS — G20 Parkinson's disease: Secondary | ICD-10-CM | POA: Diagnosis not present

## 2020-08-08 DIAGNOSIS — C50211 Malignant neoplasm of upper-inner quadrant of right female breast: Secondary | ICD-10-CM | POA: Diagnosis not present

## 2020-09-07 ENCOUNTER — Telehealth: Payer: Self-pay | Admitting: Neurology

## 2020-09-07 NOTE — Telephone Encounter (Signed)
Patient called requesting to speak with a nurse to confirm her diagnosis. She said, "I'm shopping for health insurance and need to be specific."

## 2020-09-08 NOTE — Telephone Encounter (Signed)
Her diagnosis from me is Parkinsons Disease

## 2020-09-08 NOTE — Telephone Encounter (Signed)
Spoke with patient gave informed her that she has Parkinson's and gave her the dx code. She states she is shopping around for insurance and one plan does not cover Parkinson's.  Her spouse wanted to know if there a was a mild Parkinson's code or a heavy code. And I explained that is was just one code. They voiced understanding.

## 2020-09-09 DIAGNOSIS — Z20822 Contact with and (suspected) exposure to covid-19: Secondary | ICD-10-CM | POA: Diagnosis not present

## 2020-09-09 DIAGNOSIS — B349 Viral infection, unspecified: Secondary | ICD-10-CM | POA: Diagnosis not present

## 2020-09-09 DIAGNOSIS — R0981 Nasal congestion: Secondary | ICD-10-CM | POA: Diagnosis not present

## 2020-09-09 DIAGNOSIS — R6883 Chills (without fever): Secondary | ICD-10-CM | POA: Diagnosis not present

## 2020-09-10 ENCOUNTER — Other Ambulatory Visit: Payer: Self-pay | Admitting: Hematology and Oncology

## 2020-10-10 ENCOUNTER — Telehealth: Payer: Self-pay | Admitting: Neurology

## 2020-10-10 ENCOUNTER — Telehealth: Payer: Self-pay | Admitting: Hematology and Oncology

## 2020-10-10 NOTE — Telephone Encounter (Signed)
Line busy at 257

## 2020-10-10 NOTE — Telephone Encounter (Signed)
Patient called in and stated she is moving out of [place] the first week in February and would like to get a recommendation on who she should see? She also has some questions on how to handle prescriptions until she has an appointment with a new physician?

## 2020-10-10 NOTE — Telephone Encounter (Signed)
Rescheduled appointment per 1/3 schedule message. Patient is aware of changes. 

## 2020-10-11 NOTE — Telephone Encounter (Signed)
Left message for patient to contact office with questions.

## 2020-10-11 NOTE — Telephone Encounter (Signed)
Please advise to patient, thanks.

## 2020-10-12 NOTE — Telephone Encounter (Signed)
Spoke with patient and asked when she will be leaving the first week of February. Patient states she is hoping she can be seen to see Dr Tat before she leaves. patient [place] to [place]. Patient states she is also concerned about running out of her medication.  Patient states she hopes Dr Tat can squeeze her in before she leaves. Advised patient that I would speak with Dr Tat and get back to her. She voiced understanding.

## 2020-10-13 NOTE — Telephone Encounter (Signed)
Left detailed message informing patient that we have added her to the cancellation list. Advised a call back with any questions or concerns.

## 2020-10-13 NOTE — Telephone Encounter (Signed)
She is on cx list and hope to get her in before that time.

## 2020-10-17 NOTE — Progress Notes (Signed)
Assessment/Plan:   1.  Parkinsons Disease  -Continue carbidopa/levodopa 25/100, 1 tablet 3 times daily  -discussed adding carbidopa/levodopa 50/200 CR at bed for feet/leg stiffness but she doesn't want to do that yet  -pt moving to [place].  Movement [place] at [place] may be an option for her in the future.    -mild dyskinesia L foot.  No med required right now  2.  Generalized anxiety disorder  -Patient currently following with NP with psychiatry.  Will need to reestablish care when she gets to where she is moving.  -Currently on sertraline, 200 mg daily.  3.  History of breast cancer  -Status postmastectomy.  -Following with oncology.  4.  Neurogenic Orthostatic Hypotension  -Monitoring.  Has declined abdominal compression binder, but did better as she increased her water intake.  Subjective:   Gloria Ware was seen today in follow up for Parkinsons disease.  My previous records were reviewed prior to todays visit as well as outside records available to me.  Patient worked into the schedule, as she wanted to be seen before she moved out of state.  Moving to [facility] in few weeks.   Pt denies falls.  Pt denies lightheadedness and states dizziness near resolved.  "I can do things that I didn't used to be able to do."    No hallucinations.  Mood has been good.  Current prescribed movement disorder medications: Carbidopa/levodopa 25/100, 1 tablet 3 times daily   ALLERGIES:   Allergies  Allergen Reactions   Penicillins Other (See Comments)    Throat burning Has patient had a PCN reaction causing immediate rash, facial/tongue/throat swelling, SOB or lightheadedness with hypotension: No Has patient had a PCN reaction causing severe rash involving mucus membranes or skin necrosis: No Has patient had a PCN reaction that required hospitalization: No Has patient had a PCN reaction occurring within the last 10 years: No If all of the above answers are "NO", then may proceed with  Cephalosporin use.    Sulfa Antibiotics Nausea And Vomiting   Atorvastatin      Muscle aches    Zetia [Ezetimibe] Other (See Comments)    Muscle aches     CURRENT MEDICATIONS:  Outpatient Encounter Medications as of 10/19/2020  Medication Sig   acetaminophen (TYLENOL) 500 MG tablet Take 500 mg by mouth daily as needed for moderate pain or headache.   anastrozole (ARIMIDEX) 1 MG tablet TAKE ONE TABLET BY MOUTH DAILY   Calcium Carbonate-Vitamin D (CALTRATE 600+D PO) Take 1 tablet by mouth 2 (two) times daily.   carbidopa-levodopa (SINEMET IR) 25-100 MG tablet TAKE 1 TABLET BY MOUTH 3 TIMES DAILY. 8AM/NOON/4PM   docusate sodium (COLACE) 100 MG capsule Take 100 mg by mouth at bedtime.   feeding supplement (BOOST HIGH PROTEIN) LIQD Take by mouth every morning.   LORazepam (ATIVAN) 0.5 MG tablet Take 1 tablet (0.5 mg total) by mouth daily as needed for anxiety.   MELATONIN PO Take 1 tablet by mouth at bedtime as needed.    sertraline (ZOLOFT) 100 MG tablet Take 200 mg by mouth daily.    No facility-administered encounter medications on file as of 10/19/2020.    Objective:   PHYSICAL EXAMINATION:    VITALS:  There were no vitals filed for this visit.  GEN:  The patient appears stated age and is in NAD. HEENT:  Normocephalic, atraumatic.  The mucous membranes are moist. The superficial temporal arteries are without ropiness or tenderness. CV:  RRR Lungs:  CTAB Neck/HEME:  There are no carotid bruits bilaterally.  Neurological examination:  Orientation: The patient is alert and oriented x3. Cranial nerves: There is good facial symmetry with facial hypomimia. The speech is fluent and clear. Soft palate rises symmetrically and there is no tongue deviation. Hearing is intact to conversational tone. Sensation: Sensation is intact to light touch throughout Motor: Strength is at least antigravity x4.   Movement examination: Tone: There is mild increased tone in the  LUE Abnormal movements: rare dyskinesia in the L foot Coordination:  There is no decremation with RAM's Gait and Station: The patient has no difficulty arising out of a deep-seated chair without the use of the hands. The patient's stride length is good.    I have reviewed and interpreted the following labs independently    Chemistry      Component Value Date/Time   NA 137 12/01/2019 0943   NA 138 09/30/2017 1242   K 4.2 12/01/2019 0943   K 4.0 09/30/2017 1242   CL 101 12/01/2019 0943   CO2 28 12/01/2019 0943   CO2 23 09/30/2017 1242   BUN 21 12/01/2019 0943   BUN 14.4 09/30/2017 1242   CREATININE 0.86 12/01/2019 0943   CREATININE 1.0 09/30/2017 1242      Component Value Date/Time   CALCIUM 10.1 12/01/2019 0943   CALCIUM 8.9 09/30/2017 1242   ALKPHOS 181 (H) 01/14/2018 1430   ALKPHOS 136 09/30/2017 1242   AST 21 12/01/2019 0943   AST 30 01/14/2018 1430   AST 12 09/30/2017 1242   ALT 24 12/01/2019 0943   ALT 65 (H) 01/14/2018 1430   ALT 13 09/30/2017 1242   BILITOT 0.5 12/01/2019 0943   BILITOT 0.2 01/14/2018 1430   BILITOT <0.22 09/30/2017 1242       Lab Results  Component Value Date   WBC 4.2 01/14/2018   HGB 12.2 01/14/2018   HCT 36.3 01/14/2018   MCV 91.9 01/14/2018   PLT 161 01/14/2018    Lab Results  Component Value Date   TSH 2.54 12/01/2019     Total time spent on today's visit was 30 minutes, including both face-to-face time and nonface-to-face time.  Time included that spent on review of records (prior notes available to me/labs/imaging if pertinent), discussing treatment and goals, answering patient's questions and coordinating care.  Cc:  [name], MD

## 2020-10-19 ENCOUNTER — Other Ambulatory Visit: Payer: Self-pay

## 2020-10-19 ENCOUNTER — Ambulatory Visit (INDEPENDENT_AMBULATORY_CARE_PROVIDER_SITE_OTHER): Payer: Medicare Other | Admitting: Neurology

## 2020-10-19 ENCOUNTER — Encounter: Payer: Self-pay | Admitting: Neurology

## 2020-10-19 VITALS — BP 111/72 | HR 88 | Ht 61.0 in | Wt 137.0 lb

## 2020-10-19 DIAGNOSIS — G20 Parkinson's disease: Secondary | ICD-10-CM

## 2020-10-19 DIAGNOSIS — G249 Dystonia, unspecified: Secondary | ICD-10-CM | POA: Diagnosis not present

## 2020-10-19 NOTE — Patient Instructions (Signed)
You have mild Parkinsons disease (hoehn and yoehr stage 2).  You look great today!  Let us know if you need a referral to your new physician.  The physicians and staff at [facility] are committed to providing excellent care. You may receive a survey requesting feedback about your experience at our office. We strive to receive "very good" responses to the survey questions. If you feel that your experience would prevent you from giving the office a "very good " response, please contact our office to try to remedy the situation. We may be reached at [phone]. Thank you for taking the time out of your busy day to complete the survey.

## 2020-10-21 ENCOUNTER — Telehealth: Payer: Self-pay | Admitting: Hematology and Oncology

## 2020-10-21 NOTE — Assessment & Plan Note (Deleted)
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 Mammaprint high risk: Dose since Adriamycin and Cytoxan x31fllowed by Taxol weekly x1 09/23/2017-10/21/17 stopped for toxicities 2. adjuvant radiation therapy2/21/19- 01/13/18 3. Followed by adjuvant antiestrogen therapy -------------------------------------------------------------------------------------------------------------------------------------- Current treatment: Anti estrogen therapy withanastrozole1 mg daily starting 02/05/18, reduced to 0.5 mg daily 05/18/2019 She stopped anastrozole briefly but her symptoms did not improve.  Resumed at half a tablet daily will be increased to a full tablet 01/21/2020.  Anastrozoletoxicities: 1.Muscle and joint pains 2.Tremors: Patient was diagnosed with Parkinson's disease.  She has an appointment this afternoon this week her neurologist to talk about parkinsonian treatment.  I instructed her to discuss propranolol with her neurologist.  It may be discontinued if other options are available because of her low blood pressure issues. 3.Emotional issues: Patient has severe anxiety and it appears that everything upsets her significantly. She is seeing a cSocial worker  She may be seeing a psychiatrist soon. Currently on anastrozole at half a tablet daily.  She will increase it to full tablet daily because she feels that she tolerates it fairly well.   Breast cancer surveillance: 1.Left mammogram10/14/2020: Benign breast density category C 2.Bone density: 05/12/2019: Osteopenia T score -2 3.Breast examination 01/21/2020: Benign, right mastectomy, small palpable nodule along the surgical scar which is related to scar tissue. There is no concern for recurrent breast cancer.  Her  husband had prostate cancer and had received prostate seed implantation.  Return to clinic in1 yearfor follow-up

## 2020-10-21 NOTE — Telephone Encounter (Signed)
Rescheduled from 1/17. Called and spoke with pt, confirmed 1/27 appt

## 2020-10-24 ENCOUNTER — Ambulatory Visit: Payer: Medicare Other | Admitting: Hematology and Oncology

## 2020-11-02 NOTE — Assessment & Plan Note (Signed)
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 Mammaprint high risk: Dose since Adriamycin and Cytoxan x58fllowed by Taxol weekly x1 09/23/2017-10/21/17 stopped for toxicities 2. adjuvant radiation therapy2/21/19- 01/13/18 3. Followed by adjuvant antiestrogen therapy -------------------------------------------------------------------------------------------------------------------------------------- Current treatment: Anti estrogen therapy withanastrozole1 mg daily starting 02/05/18, reduced to 0.5 mg daily 05/18/2019 She stopped anastrozole briefly but her symptoms did not improve.  Resumed at half a tablet daily will be increased to a full tablet 01/21/2020.  Anastrozoletoxicities: 1.Muscle and joint pains 2.Tremors: Patient was diagnosed with Parkinson's disease 3.Emotional issues: Patient has severe anxiety   Breast cancer surveillance: 1.Left mammogram10/24/2021: Benign breast density category C 2.Bone density: 05/12/2019: Osteopenia T score -2 3.Breast examination  11/03/2020: Benign, right mastectomy, small palpable nodule along the surgical scar which is related to scar tissue. There is no concern for recurrent breast cancer.  Her husband had prostate cancer and had received prostate seed implantation.  Return to clinic in1 yearfor follow-up

## 2020-11-02 NOTE — Progress Notes (Signed)
Patient Care Team: [name], MD as PCP - General (Family Medicine) [name], MD as Consulting Physician (General Surgery) Nicholas Lose, MD as Consulting Physician (Hematology and Oncology) Kyung Rudd, MD as Consulting Physician (Radiation Oncology) Delice Bison, Charlestine Massed, NP as Nurse Practitioner (Hematology and Oncology) Tat, Eustace Quail, DO as Consulting Physician (Neurology)  DIAGNOSIS:    ICD-10-CM   1. Malignant neoplasm of upper-inner quadrant of right breast in female, estrogen receptor positive ([place])  C50.211    Z17.0     SUMMARY OF ONCOLOGIC HISTORY: Oncology History  Malignant neoplasm of upper-inner quadrant of right breast in female, estrogen receptor positive ([place])  06/21/2017 Initial Diagnosis   Screening detected right breast mass and calcifications 1 cm at 1:00 position biopsy of mass and calcifications revealed invasive lobular cancer with pleomorphic features, grade 2, axillary lymph node biopsy positive for cancer, ER/PR positive HER-2 negative with a Ki-67 of 40%, T1b N1 stage IB AJCC 8   06/27/2017 Genetic Testing   Genetic testing was negative and did not reveal any mutations.  Genes tested include: ALK, APC, ATM, AXIN2, BAP1, BARD1, BLM, BMPR1A, BRCA1, BRCA2, BRIP1, CASR, CDC73, CDH1, CDK4, CDKN1B, CDKN1C, CDKN2A, CEBPA, CHEK2, CTNNA1, DICER1, DIS3L2, EGFR, EPCAM, FH, FLCN, GATA2, GPC3, GREM1, HOXB13, HRAS, KIT, MAX, MEN1, MET, MITF, MLH1, MSH2, MSH3, MSH6, MUTYH, NBN, NF1, NF2, NTHL1, PALB2, PDGFRA, PHOX2B, PMS2, POLD1, POLE, POT1, PRKAR1A, PTCH1, PTEN, RAD50, RAD51C, RAD51D, RB1, RECQL4, RET, RUNX1, SDHA, SDHAF2, SDHB, SDHC, SDHD, SMAD4, SMARCA4, SMARCB1, SMARCE1, STK11, SUFU, TERC, TERT, TMEM127, TP53, TSC1, TSC2, VHL, WRN, WT1.   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, LCIS, margins negative, 2/6 lymph nodes +1 with isolated tumor cells, T1BN1 stage I the AJCC 8 ER 95%, PR 30%,  HER-2 negative, Ki-67 40%   08/12/2017 Miscellaneous   Mammaprint high risk luminal type B   09/17/2017 -  Chemotherapy   Adjuvant chemo with dose dense Adriamycin and Cytoxan X 2 (Stopped due to toxicities) followed by Taxol weekly x1 stopped due to toxicities    10/07/2017 - 10/09/2017 Hospital Admission   Adm for failure to thrive, insomnia and back pain   11/28/2017 - 01/13/2018 Radiation Therapy   Adj XRT   02/2018 -  Anti-estrogen oral therapy   Anastrozole daily, reduced to 1/2 tablet daily on 04/02/19 due to myalgias     CHIEF COMPLIANT: Follow-up of right breast cancer onanastrozole  INTERVAL HISTORY: Gloria Ware is a 75 y.o. with above-mentioned history of right breast cancer treated with mastectomy,adjuvant chemotherapy,and radiationwho is currently on antiestrogen therapy with anastrozole1/2 tablet daily. Mammogram on 07/31/20 showed no evidence of malignancy in the left breast. She presents to the clinic today for follow-up.  She informed me that she is moving to [place] next week.  She will be closer to her family and once she is there she will likely find another oncologist for her and her husband.  Her husband is receiving treatment for prostate cancer.  Her major concern is related to parkinsonism.  She has been hearing musical notes in her left ear which is probably a form of hallucination.  She also has tinnitus issues.  ALLERGIES:  is allergic to bupropion, penicillins, sulfa antibiotics, atorvastatin, and zetia [ezetimibe].  MEDICATIONS:  Current Outpatient Medications  Medication Sig Dispense Refill  . acetaminophen (TYLENOL) 500 MG tablet Take 500 mg by mouth daily as needed for moderate pain or headache.    . anastrozole (ARIMIDEX)  1 MG tablet TAKE ONE TABLET BY MOUTH DAILY 90 tablet 1  . atorvastatin (LIPITOR) 10 MG tablet Take 10 mg by mouth daily.    . Calcium Carbonate-Vitamin D (CALTRATE 600+D PO) Take 1 tablet by mouth 2 (two) times daily.    .  carbidopa-levodopa (SINEMET IR) 25-100 MG tablet TAKE 1 TABLET BY MOUTH 3 TIMES DAILY. 8AM/NOON/4PM 270 tablet 1  . docusate sodium (COLACE) 100 MG capsule Take 100 mg by mouth at bedtime.    . feeding supplement (BOOST HIGH PROTEIN) LIQD Take by mouth every morning. (Patient not taking: Reported on 10/19/2020)    . LORazepam (ATIVAN) 0.5 MG tablet Take 1 tablet (0.5 mg total) by mouth daily as needed for anxiety. 30 tablet 2  . MELATONIN PO Take 1 tablet by mouth at bedtime as needed.  (Patient not taking: Reported on 10/19/2020)    . sertraline (ZOLOFT) 100 MG tablet Take 200 mg by mouth daily.      No current facility-administered medications for this visit.    PHYSICAL EXAMINATION: ECOG PERFORMANCE STATUS: 1 - Symptomatic but completely ambulatory  Vitals:   11/03/20 1019  BP: (!) 101/59  Pulse: (!) 103  Resp: 18  Temp: 97.9 F (36.6 C)  SpO2: 100%   Filed Weights   11/03/20 1019  Weight: 135 lb 4.8 oz (61.4 kg)      LABORATORY DATA:  I have reviewed the data as listed CMP Latest Ref Rng & Units 12/01/2019 01/14/2018 11/05/2017  Glucose 65 - 99 mg/dL 94 106 104(H)  BUN 7 - 25 mg/dL _0 Creatinine 0.60 - 0.93 mg/dL 0.86 0.86 0.70  Sodium 135 - 146 mmol/L 137 138 136  Potassium 3.5 - 5.3 mmol/L 4.2 4.2 3.8  Chloride 98 - 110 mmol/L 101 103 100(L)  CO2 20 - 32 mmol/L 28 27 -  Calcium 8.6 - 10.4 mg/dL 10.1 9.9 -  Total Protein 6.1 - 8.1 g/dL 7.2 7.3 -  Total Bilirubin 0.2 - 1.2 mg/dL 0.5 0.2 -  Alkaline Phos 40 - 150 U/L - 181(H) -  AST 10 - 35 U/L 21 30 -  ALT 6 - 29 U/L 24 65(H) -    Lab Results  Component Value Date   WBC 4.2 01/14/2018   HGB 12.2 01/14/2018   HCT 36.3 01/14/2018   MCV 91.9 01/14/2018   PLT 161 01/14/2018   NEUTROABS 2.7 01/14/2018    ASSESSMENT & PLAN:  Malignant neoplasm of upper-inner quadrant of right breast in female, estrogen receptor positive ([place]) 08/06/2017 right mastectomy: Scattered foci of invasive lobular cancer, grade 2,  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 Mammaprint high risk: Dose since Adriamycin and Cytoxan x47fllowed by Taxol weekly x1 09/23/2017-10/21/17 stopped for toxicities 2. adjuvant radiation therapy2/21/19- 01/13/18 3. Followed by adjuvant antiestrogen therapy -------------------------------------------------------------------------------------------------------------------------------------- Current treatment: Anti estrogen therapy withanastrozole1 mg daily starting 02/05/18, reduced to 0.5 mg daily 05/18/2019 She stopped anastrozole briefly but her symptoms did not improve.  Resumed at half a tablet daily will be increased to a full tablet 01/21/2020.  Plan of treatment is 7 years.  Anastrozoletoxicities: 1.Muscle and joint pains 2.Tremors: Patient was diagnosed with Parkinson's disease 3.Emotional issues: Patient has severe anxiety   Breast cancer surveillance: 1.Left mammogram10/24/2021: Benign breast density category C 2.Bone density: 05/12/2019: Osteopenia T score -2    Her husband  had prostate cancer and had received prostate seed implantation.  Since the patient will be moving to [place], she will set up an oncologist to follow her locally.  Until then we are happy to support her with any refills needed for anastrozole.    No orders of the defined types were placed in this encounter.  The patient has a good understanding of the overall plan. she agrees with it. she will call with any problems that may develop before the next visit here.  Total time spent: 20 mins including face to face time and time spent for planning, charting and coordination of care  Nicholas Lose, MD 11/03/2020  I, Cloyde Reams Dorshimer, am acting as scribe for Dr. Nicholas Lose.  I have reviewed the above documentation for accuracy and completeness, and  I agree with the above.

## 2020-11-03 ENCOUNTER — Other Ambulatory Visit: Payer: Self-pay

## 2020-11-03 ENCOUNTER — Inpatient Hospital Stay: Payer: Medicare Other | Attending: Hematology and Oncology | Admitting: Hematology and Oncology

## 2020-11-03 DIAGNOSIS — Z9011 Acquired absence of right breast and nipple: Secondary | ICD-10-CM | POA: Insufficient documentation

## 2020-11-03 DIAGNOSIS — Z170 Estrogen receptor positive status [ER+]: Secondary | ICD-10-CM

## 2020-11-03 DIAGNOSIS — Z9221 Personal history of antineoplastic chemotherapy: Secondary | ICD-10-CM | POA: Diagnosis not present

## 2020-11-03 DIAGNOSIS — Z79811 Long term (current) use of aromatase inhibitors: Secondary | ICD-10-CM | POA: Insufficient documentation

## 2020-11-03 DIAGNOSIS — C50211 Malignant neoplasm of upper-inner quadrant of right female breast: Secondary | ICD-10-CM | POA: Diagnosis not present

## 2020-11-10 DIAGNOSIS — E7800 Pure hypercholesterolemia, unspecified: Secondary | ICD-10-CM | POA: Diagnosis not present

## 2020-11-30 ENCOUNTER — Telehealth: Payer: Self-pay | Admitting: Neurology

## 2020-12-01 ENCOUNTER — Telehealth: Payer: Self-pay | Admitting: Neurology

## 2020-12-01 MED ORDER — CARBIDOPA-LEVODOPA 25-100 MG PO TABS: 1.0000 | ORAL_TABLET | Freq: Three times a day (TID) | ORAL | 1 refills | Status: AC

## 2020-12-01 NOTE — Telephone Encounter (Signed)
Rx(s) sent to pharmacy electronically.  Ok to refill per Dr Carles Collet.

## 2020-12-02 ENCOUNTER — Other Ambulatory Visit: Payer: Self-pay | Admitting: Neurology

## 2020-12-05 ENCOUNTER — Telehealth: Payer: Self-pay | Admitting: Neurology

## 2020-12-05 DIAGNOSIS — G20 Parkinson's disease: Secondary | ICD-10-CM

## 2020-12-05 NOTE — Telephone Encounter (Signed)
Patient has moved and would like for Dr. Carles Collet to send a referral to Dr. Arrie Senate [address] [place], [place]. Their phone number is [phone]. She did not have a fax number.

## 2020-12-05 NOTE — Telephone Encounter (Signed)
Ok to refer.  Dx:  Parkinsons Disease

## 2020-12-06 NOTE — Telephone Encounter (Signed)
Called patient and informed her that a referral has been sent to Dr. Arrie Senate. Patient asked for medical records to be sent. Informed patient that she would have to contact medical records at [phone] to have her medical records sent.

## 2020-12-06 NOTE — Telephone Encounter (Signed)
Referral created and faxed to Dr. Nils Pyle.

## 2020-12-08 ENCOUNTER — Ambulatory Visit: Payer: Medicare Other | Admitting: Neurology

## 2020-12-14 DIAGNOSIS — G903 Multi-system degeneration of the autonomic nervous system: Secondary | ICD-10-CM | POA: Diagnosis not present

## 2020-12-14 DIAGNOSIS — F419 Anxiety disorder, unspecified: Secondary | ICD-10-CM | POA: Diagnosis not present

## 2020-12-14 DIAGNOSIS — E538 Deficiency of other specified B group vitamins: Secondary | ICD-10-CM | POA: Diagnosis not present

## 2020-12-14 DIAGNOSIS — R42 Dizziness and giddiness: Secondary | ICD-10-CM | POA: Diagnosis not present

## 2020-12-14 DIAGNOSIS — F32A Depression, unspecified: Secondary | ICD-10-CM | POA: Diagnosis not present

## 2020-12-14 DIAGNOSIS — E559 Vitamin D deficiency, unspecified: Secondary | ICD-10-CM | POA: Diagnosis not present

## 2020-12-14 DIAGNOSIS — R269 Unspecified abnormalities of gait and mobility: Secondary | ICD-10-CM | POA: Diagnosis not present

## 2020-12-14 DIAGNOSIS — G20 Parkinson's disease: Secondary | ICD-10-CM | POA: Diagnosis not present

## 2021-01-19 ENCOUNTER — Ambulatory Visit: Payer: Medicare Other | Admitting: Hematology and Oncology

## 2021-02-28 ENCOUNTER — Other Ambulatory Visit: Payer: Self-pay | Admitting: Hematology and Oncology
